# Patient Record
Sex: Male | Born: 1935
Health system: Southern US, Community
[De-identification: ages and names within clinical notes are randomized; demographics above are authoritative.]

## PROBLEM LIST (undated history)

## (undated) DIAGNOSIS — M8580 Other specified disorders of bone density and structure, unspecified site: Secondary | ICD-10-CM

## (undated) DIAGNOSIS — H3530 Unspecified macular degeneration: Secondary | ICD-10-CM

## (undated) DIAGNOSIS — E291 Testicular hypofunction: Secondary | ICD-10-CM

## (undated) DIAGNOSIS — G4733 Obstructive sleep apnea (adult) (pediatric): Secondary | ICD-10-CM

## (undated) DIAGNOSIS — M5136 Other intervertebral disc degeneration, lumbar region: Secondary | ICD-10-CM

## (undated) DIAGNOSIS — F32A Depression, unspecified: Secondary | ICD-10-CM

## (undated) DIAGNOSIS — K5790 Diverticulosis of intestine, part unspecified, without perforation or abscess without bleeding: Secondary | ICD-10-CM

## (undated) DIAGNOSIS — C61 Malignant neoplasm of prostate: Secondary | ICD-10-CM

## (undated) DIAGNOSIS — H409 Unspecified glaucoma: Secondary | ICD-10-CM

## (undated) DIAGNOSIS — F329 Major depressive disorder, single episode, unspecified: Secondary | ICD-10-CM

## (undated) DIAGNOSIS — D126 Benign neoplasm of colon, unspecified: Secondary | ICD-10-CM

## (undated) DIAGNOSIS — K219 Gastro-esophageal reflux disease without esophagitis: Secondary | ICD-10-CM

## (undated) DIAGNOSIS — Z9289 Personal history of other medical treatment: Secondary | ICD-10-CM

## (undated) DIAGNOSIS — E785 Hyperlipidemia, unspecified: Secondary | ICD-10-CM

## (undated) DIAGNOSIS — I4891 Unspecified atrial fibrillation: Secondary | ICD-10-CM

## (undated) DIAGNOSIS — Z951 Presence of aortocoronary bypass graft: Secondary | ICD-10-CM

## (undated) DIAGNOSIS — I350 Nonrheumatic aortic (valve) stenosis: Secondary | ICD-10-CM

## (undated) DIAGNOSIS — R972 Elevated prostate specific antigen [PSA]: Secondary | ICD-10-CM

## (undated) DIAGNOSIS — M1990 Unspecified osteoarthritis, unspecified site: Secondary | ICD-10-CM

## (undated) DIAGNOSIS — D649 Anemia, unspecified: Secondary | ICD-10-CM

## (undated) DIAGNOSIS — Z87442 Personal history of urinary calculi: Secondary | ICD-10-CM

## (undated) DIAGNOSIS — I2510 Atherosclerotic heart disease of native coronary artery without angina pectoris: Secondary | ICD-10-CM

## (undated) DIAGNOSIS — M51369 Other intervertebral disc degeneration, lumbar region without mention of lumbar back pain or lower extremity pain: Secondary | ICD-10-CM

## (undated) DIAGNOSIS — Z952 Presence of prosthetic heart valve: Secondary | ICD-10-CM

## (undated) DIAGNOSIS — F419 Anxiety disorder, unspecified: Secondary | ICD-10-CM

## (undated) HISTORY — DX: Obstructive sleep apnea (adult) (pediatric): G47.33

## (undated) HISTORY — DX: Presence of prosthetic heart valve: Z95.2

## (undated) HISTORY — DX: Unspecified macular degeneration: H35.30

## (undated) HISTORY — DX: Anemia, unspecified: D64.9

## (undated) HISTORY — DX: Unspecified osteoarthritis, unspecified site: M19.90

## (undated) HISTORY — DX: Unspecified glaucoma: H40.9

## (undated) HISTORY — DX: Other intervertebral disc degeneration, lumbar region without mention of lumbar back pain or lower extremity pain: M51.369

## (undated) HISTORY — DX: Depression, unspecified: F32.A

## (undated) HISTORY — DX: Other intervertebral disc degeneration, lumbar region: M51.36

## (undated) HISTORY — PX: TONSILLECTOMY: SUR1361

## (undated) HISTORY — DX: Other specified disorders of bone density and structure, unspecified site: M85.80

## (undated) HISTORY — DX: Atherosclerotic heart disease of native coronary artery without angina pectoris: I25.10

## (undated) HISTORY — DX: Major depressive disorder, single episode, unspecified: F32.9

## (undated) HISTORY — PX: CORNEAL TRANSPLANT: SHX108

## (undated) HISTORY — DX: Testicular hypofunction: E29.1

## (undated) HISTORY — DX: Hyperlipidemia, unspecified: E78.5

## (undated) HISTORY — DX: Elevated prostate specific antigen (PSA): R97.20

## (undated) HISTORY — DX: Malignant neoplasm of prostate: C61

## (undated) HISTORY — PX: CATARACT EXTRACTION: SUR2

## (undated) HISTORY — DX: Nonrheumatic aortic (valve) stenosis: I35.0

## (undated) HISTORY — DX: Gastro-esophageal reflux disease without esophagitis: K21.9

## (undated) HISTORY — DX: Diverticulosis of intestine, part unspecified, without perforation or abscess without bleeding: K57.90

## (undated) HISTORY — DX: Presence of aortocoronary bypass graft: Z95.1

## (undated) HISTORY — DX: Unspecified atrial fibrillation: I48.91

---

## 1976-08-28 HISTORY — PX: BACK SURGERY: SHX140

## 1998-09-28 ENCOUNTER — Ambulatory Visit (HOSPITAL_COMMUNITY): Admission: RE | Admit: 1998-09-28 | Discharge: 1998-09-28 | Payer: Self-pay | Admitting: Cardiology

## 2000-06-12 ENCOUNTER — Encounter (INDEPENDENT_AMBULATORY_CARE_PROVIDER_SITE_OTHER): Payer: Self-pay | Admitting: Specialist

## 2000-06-12 ENCOUNTER — Other Ambulatory Visit: Admission: RE | Admit: 2000-06-12 | Discharge: 2000-06-12 | Payer: Self-pay | Admitting: *Deleted

## 2004-11-04 ENCOUNTER — Ambulatory Visit: Payer: Self-pay | Admitting: Gastroenterology

## 2004-11-15 ENCOUNTER — Ambulatory Visit: Payer: Self-pay | Admitting: Gastroenterology

## 2007-11-08 ENCOUNTER — Ambulatory Visit: Payer: Self-pay | Admitting: Vascular Surgery

## 2008-11-05 DIAGNOSIS — K5730 Diverticulosis of large intestine without perforation or abscess without bleeding: Secondary | ICD-10-CM | POA: Insufficient documentation

## 2008-11-05 DIAGNOSIS — Z860100 Personal history of colon polyps, unspecified: Secondary | ICD-10-CM | POA: Insufficient documentation

## 2008-11-05 DIAGNOSIS — K649 Unspecified hemorrhoids: Secondary | ICD-10-CM | POA: Insufficient documentation

## 2008-11-06 ENCOUNTER — Ambulatory Visit: Payer: Self-pay | Admitting: Gastroenterology

## 2008-11-06 DIAGNOSIS — F411 Generalized anxiety disorder: Secondary | ICD-10-CM

## 2008-11-06 DIAGNOSIS — M818 Other osteoporosis without current pathological fracture: Secondary | ICD-10-CM

## 2008-11-06 DIAGNOSIS — I359 Nonrheumatic aortic valve disorder, unspecified: Secondary | ICD-10-CM

## 2008-11-06 DIAGNOSIS — K5900 Constipation, unspecified: Secondary | ICD-10-CM | POA: Insufficient documentation

## 2008-11-06 DIAGNOSIS — G4733 Obstructive sleep apnea (adult) (pediatric): Secondary | ICD-10-CM | POA: Insufficient documentation

## 2008-11-23 ENCOUNTER — Ambulatory Visit: Payer: Self-pay | Admitting: Gastroenterology

## 2008-11-23 ENCOUNTER — Encounter: Payer: Self-pay | Admitting: Gastroenterology

## 2008-11-25 ENCOUNTER — Encounter: Payer: Self-pay | Admitting: Gastroenterology

## 2009-03-09 ENCOUNTER — Ambulatory Visit: Payer: Self-pay | Admitting: *Deleted

## 2010-04-18 ENCOUNTER — Ambulatory Visit: Payer: Self-pay | Admitting: Cardiology

## 2010-05-12 ENCOUNTER — Ambulatory Visit: Payer: Self-pay | Admitting: Cardiology

## 2010-05-16 ENCOUNTER — Ambulatory Visit: Payer: Self-pay | Admitting: Cardiology

## 2010-05-17 ENCOUNTER — Inpatient Hospital Stay (HOSPITAL_BASED_OUTPATIENT_CLINIC_OR_DEPARTMENT_OTHER): Admission: RE | Admit: 2010-05-17 | Discharge: 2010-05-17 | Payer: Self-pay | Admitting: Cardiology

## 2010-05-24 ENCOUNTER — Ambulatory Visit: Payer: Self-pay | Admitting: Cardiothoracic Surgery

## 2010-05-28 HISTORY — PX: AORTIC VALVE REPLACEMENT: SHX41

## 2010-05-28 HISTORY — PX: CORONARY ARTERY BYPASS GRAFT: SHX141

## 2010-05-31 ENCOUNTER — Inpatient Hospital Stay (HOSPITAL_COMMUNITY): Admission: RE | Admit: 2010-05-31 | Discharge: 2010-06-08 | Payer: Self-pay | Admitting: Cardiothoracic Surgery

## 2010-05-31 ENCOUNTER — Ambulatory Visit: Payer: Self-pay | Admitting: Cardiothoracic Surgery

## 2010-05-31 ENCOUNTER — Encounter: Payer: Self-pay | Admitting: Cardiothoracic Surgery

## 2010-05-31 ENCOUNTER — Ambulatory Visit: Payer: Self-pay | Admitting: Cardiology

## 2010-06-09 ENCOUNTER — Ambulatory Visit: Payer: Self-pay | Admitting: Cardiology

## 2010-06-13 ENCOUNTER — Ambulatory Visit: Payer: Self-pay | Admitting: Cardiology

## 2010-06-21 ENCOUNTER — Ambulatory Visit: Payer: Self-pay | Admitting: Cardiology

## 2010-06-28 ENCOUNTER — Ambulatory Visit: Payer: Self-pay | Admitting: Cardiology

## 2010-06-30 ENCOUNTER — Encounter: Admission: RE | Admit: 2010-06-30 | Discharge: 2010-06-30 | Payer: Self-pay | Admitting: Cardiothoracic Surgery

## 2010-06-30 ENCOUNTER — Ambulatory Visit: Payer: Self-pay | Admitting: Cardiothoracic Surgery

## 2010-06-30 ENCOUNTER — Encounter (HOSPITAL_COMMUNITY)
Admission: RE | Admit: 2010-06-30 | Discharge: 2010-09-27 | Payer: Self-pay | Source: Home / Self Care | Attending: Cardiology | Admitting: Cardiology

## 2010-07-05 ENCOUNTER — Ambulatory Visit: Payer: Self-pay | Admitting: Cardiology

## 2010-07-05 ENCOUNTER — Ambulatory Visit (HOSPITAL_COMMUNITY): Admission: RE | Admit: 2010-07-05 | Discharge: 2010-07-05 | Payer: Self-pay | Admitting: Cardiology

## 2010-07-05 ENCOUNTER — Encounter: Payer: Self-pay | Admitting: Cardiology

## 2010-07-05 ENCOUNTER — Ambulatory Visit: Payer: Self-pay | Admitting: Cardiovascular Disease

## 2010-07-05 ENCOUNTER — Ambulatory Visit: Payer: Self-pay

## 2010-07-12 ENCOUNTER — Ambulatory Visit: Payer: Self-pay | Admitting: Cardiology

## 2010-07-13 ENCOUNTER — Ambulatory Visit: Payer: Self-pay | Admitting: Cardiovascular Disease

## 2010-07-13 ENCOUNTER — Inpatient Hospital Stay (HOSPITAL_COMMUNITY): Admission: EM | Admit: 2010-07-13 | Discharge: 2010-07-19 | Payer: Self-pay | Admitting: Emergency Medicine

## 2010-07-17 ENCOUNTER — Encounter (INDEPENDENT_AMBULATORY_CARE_PROVIDER_SITE_OTHER): Payer: Self-pay | Admitting: Internal Medicine

## 2010-08-09 ENCOUNTER — Ambulatory Visit: Payer: Self-pay | Admitting: Cardiology

## 2010-08-16 ENCOUNTER — Ambulatory Visit: Payer: Self-pay | Admitting: Cardiology

## 2010-09-08 LAB — BASIC METABOLIC PANEL: Glucose: 122 mg/dL

## 2010-09-27 ENCOUNTER — Encounter: Payer: Self-pay | Admitting: Cardiology

## 2010-09-27 DIAGNOSIS — F32A Depression, unspecified: Secondary | ICD-10-CM | POA: Insufficient documentation

## 2010-09-27 DIAGNOSIS — I350 Nonrheumatic aortic (valve) stenosis: Secondary | ICD-10-CM | POA: Insufficient documentation

## 2010-09-27 DIAGNOSIS — I4891 Unspecified atrial fibrillation: Secondary | ICD-10-CM | POA: Insufficient documentation

## 2010-09-27 DIAGNOSIS — E785 Hyperlipidemia, unspecified: Secondary | ICD-10-CM | POA: Insufficient documentation

## 2010-09-27 DIAGNOSIS — F329 Major depressive disorder, single episode, unspecified: Secondary | ICD-10-CM | POA: Insufficient documentation

## 2010-09-27 DIAGNOSIS — I2510 Atherosclerotic heart disease of native coronary artery without angina pectoris: Secondary | ICD-10-CM | POA: Insufficient documentation

## 2010-09-28 ENCOUNTER — Encounter (HOSPITAL_COMMUNITY): Payer: Medicare Other | Attending: Cardiology

## 2010-09-28 DIAGNOSIS — I2510 Atherosclerotic heart disease of native coronary artery without angina pectoris: Secondary | ICD-10-CM | POA: Insufficient documentation

## 2010-09-28 DIAGNOSIS — Z5189 Encounter for other specified aftercare: Secondary | ICD-10-CM | POA: Insufficient documentation

## 2010-09-28 DIAGNOSIS — I359 Nonrheumatic aortic valve disorder, unspecified: Secondary | ICD-10-CM | POA: Insufficient documentation

## 2010-09-28 DIAGNOSIS — Z954 Presence of other heart-valve replacement: Secondary | ICD-10-CM | POA: Insufficient documentation

## 2010-09-28 DIAGNOSIS — K219 Gastro-esophageal reflux disease without esophagitis: Secondary | ICD-10-CM | POA: Insufficient documentation

## 2010-09-28 DIAGNOSIS — I4891 Unspecified atrial fibrillation: Secondary | ICD-10-CM | POA: Insufficient documentation

## 2010-09-28 DIAGNOSIS — Z7982 Long term (current) use of aspirin: Secondary | ICD-10-CM | POA: Insufficient documentation

## 2010-09-28 DIAGNOSIS — G4733 Obstructive sleep apnea (adult) (pediatric): Secondary | ICD-10-CM | POA: Insufficient documentation

## 2010-09-28 DIAGNOSIS — E785 Hyperlipidemia, unspecified: Secondary | ICD-10-CM | POA: Insufficient documentation

## 2010-09-28 DIAGNOSIS — Z7901 Long term (current) use of anticoagulants: Secondary | ICD-10-CM | POA: Insufficient documentation

## 2010-09-28 DIAGNOSIS — Z87891 Personal history of nicotine dependence: Secondary | ICD-10-CM | POA: Insufficient documentation

## 2010-09-28 DIAGNOSIS — Z951 Presence of aortocoronary bypass graft: Secondary | ICD-10-CM | POA: Insufficient documentation

## 2010-09-30 ENCOUNTER — Encounter (HOSPITAL_COMMUNITY): Payer: Medicare Other

## 2010-10-03 ENCOUNTER — Encounter (HOSPITAL_COMMUNITY): Payer: Medicare Other

## 2010-10-05 ENCOUNTER — Encounter (HOSPITAL_COMMUNITY): Payer: Medicare Other

## 2010-10-07 ENCOUNTER — Encounter (HOSPITAL_COMMUNITY): Payer: Medicare Other

## 2010-10-10 ENCOUNTER — Encounter (HOSPITAL_COMMUNITY): Payer: Medicare Other

## 2010-10-12 ENCOUNTER — Encounter (HOSPITAL_COMMUNITY): Payer: Medicare Other

## 2010-10-14 ENCOUNTER — Encounter (HOSPITAL_COMMUNITY): Payer: Medicare Other

## 2010-10-17 ENCOUNTER — Encounter (HOSPITAL_COMMUNITY): Payer: Medicare Other

## 2010-10-19 ENCOUNTER — Encounter (HOSPITAL_COMMUNITY): Payer: Medicare Other

## 2010-10-21 ENCOUNTER — Encounter (HOSPITAL_COMMUNITY): Payer: Medicare Other

## 2010-10-24 ENCOUNTER — Encounter (HOSPITAL_COMMUNITY): Payer: Medicare Other

## 2010-10-26 ENCOUNTER — Encounter (HOSPITAL_COMMUNITY): Payer: Medicare Other

## 2010-10-28 ENCOUNTER — Encounter (HOSPITAL_COMMUNITY): Payer: Self-pay

## 2010-10-31 ENCOUNTER — Encounter (HOSPITAL_COMMUNITY): Payer: Self-pay

## 2010-11-02 ENCOUNTER — Encounter (HOSPITAL_COMMUNITY): Payer: Self-pay

## 2010-11-04 ENCOUNTER — Encounter (HOSPITAL_COMMUNITY): Payer: Self-pay

## 2010-11-07 ENCOUNTER — Encounter (HOSPITAL_COMMUNITY): Payer: Self-pay

## 2010-11-08 LAB — CULTURE, BLOOD (ROUTINE X 2)
Culture  Setup Time: 201111190200
Culture: NO GROWTH

## 2010-11-08 LAB — DIFFERENTIAL
Basophils Absolute: 0 10*3/uL (ref 0.0–0.1)
Basophils Absolute: 0.1 10*3/uL (ref 0.0–0.1)
Basophils Absolute: 0.1 10*3/uL (ref 0.0–0.1)
Basophils Relative: 0 % (ref 0–1)
Basophils Relative: 0 % (ref 0–1)
Basophils Relative: 1 % (ref 0–1)
Eosinophils Absolute: 0.1 10*3/uL (ref 0.0–0.7)
Eosinophils Absolute: 0.3 10*3/uL (ref 0.0–0.7)
Eosinophils Relative: 0 % (ref 0–5)
Eosinophils Relative: 1 % (ref 0–5)
Lymphocytes Relative: 10 % — ABNORMAL LOW (ref 12–46)
Lymphocytes Relative: 19 % (ref 12–46)
Lymphocytes Relative: 4 % — ABNORMAL LOW (ref 12–46)
Lymphs Abs: 0.5 10*3/uL — ABNORMAL LOW (ref 0.7–4.0)
Lymphs Abs: 2.2 10*3/uL (ref 0.7–4.0)
Monocytes Absolute: 0.1 10*3/uL (ref 0.1–1.0)
Monocytes Absolute: 1.7 10*3/uL — ABNORMAL HIGH (ref 0.1–1.0)
Monocytes Relative: 1 % — ABNORMAL LOW (ref 3–12)
Monocytes Relative: 11 % (ref 3–12)
Neutro Abs: 11.1 10*3/uL — ABNORMAL HIGH (ref 1.7–7.7)
Neutro Abs: 8.1 10*3/uL — ABNORMAL HIGH (ref 1.7–7.7)
Neutro Abs: 9.3 10*3/uL — ABNORMAL HIGH (ref 1.7–7.7)
Neutrophils Relative %: 80 % — ABNORMAL HIGH (ref 43–77)
Neutrophils Relative %: 95 % — ABNORMAL HIGH (ref 43–77)

## 2010-11-08 LAB — URINE CULTURE
Colony Count: NO GROWTH
Culture  Setup Time: 201111161349

## 2010-11-08 LAB — COMPREHENSIVE METABOLIC PANEL
ALT: 29 U/L (ref 0–53)
ALT: 32 U/L (ref 0–53)
ALT: 32 U/L (ref 0–53)
AST: 26 U/L (ref 0–37)
AST: 75 U/L — ABNORMAL HIGH (ref 0–37)
Albumin: 2.5 g/dL — ABNORMAL LOW (ref 3.5–5.2)
Albumin: 2.7 g/dL — ABNORMAL LOW (ref 3.5–5.2)
Alkaline Phosphatase: 48 U/L (ref 39–117)
Alkaline Phosphatase: 49 U/L (ref 39–117)
Alkaline Phosphatase: 51 U/L (ref 39–117)
Alkaline Phosphatase: 51 U/L (ref 39–117)
BUN: 11 mg/dL (ref 6–23)
BUN: 7 mg/dL (ref 6–23)
CO2: 27 mEq/L (ref 19–32)
CO2: 27 mEq/L (ref 19–32)
Chloride: 101 mEq/L (ref 96–112)
Chloride: 104 mEq/L (ref 96–112)
Chloride: 104 mEq/L (ref 96–112)
Creatinine, Ser: 0.97 mg/dL (ref 0.4–1.5)
GFR calc Af Amer: >60 mL/min (ref >60)
GFR calc Af Amer: >60 mL/min (ref >60)
GFR calc non Af Amer: >60 mL/min (ref >60)
Glucose, Bld: 96 mg/dL (ref 70–99)
Glucose, Bld: 96 mg/dL (ref 70–99)
Potassium: 3.7 mEq/L (ref 3.5–5.1)
Potassium: 3.9 mEq/L (ref 3.5–5.1)
Potassium: 4.1 mEq/L (ref 3.5–5.1)
Potassium: 4.1 mEq/L (ref 3.5–5.1)
Sodium: 136 mEq/L (ref 135–145)
Sodium: 138 mEq/L (ref 135–145)
Sodium: 138 mEq/L (ref 135–145)
Total Bilirubin: 0.3 mg/dL (ref 0.3–1.2)
Total Bilirubin: 0.4 mg/dL (ref 0.3–1.2)
Total Bilirubin: 0.4 mg/dL (ref 0.3–1.2)
Total Protein: 6.3 g/dL (ref 6.0–8.3)
Total Protein: 6.5 g/dL (ref 6.0–8.3)
Total Protein: 7.3 g/dL (ref 6.0–8.3)

## 2010-11-08 LAB — URINE MICROSCOPIC-ADD ON

## 2010-11-08 LAB — URINALYSIS, ROUTINE W REFLEX MICROSCOPIC
Glucose, UA: NEGATIVE mg/dL
Hgb urine dipstick: NEGATIVE
Nitrite: POSITIVE — AB
Protein, ur: 100 mg/dL — AB
Urobilinogen, UA: 1 mg/dL (ref 0.0–1.0)

## 2010-11-08 LAB — PROTIME-INR
INR: 1.78 — ABNORMAL HIGH (ref 0.00–1.49)
INR: 2.7 — ABNORMAL HIGH (ref 0.00–1.49)
INR: 3.1 — ABNORMAL HIGH (ref 0.00–1.49)
INR: 3.39 — ABNORMAL HIGH (ref 0.00–1.49)
INR: 3.45 — ABNORMAL HIGH (ref 0.00–1.49)
Prothrombin Time: 20.9 seconds — ABNORMAL HIGH (ref 11.6–15.2)
Prothrombin Time: 35.4 seconds — ABNORMAL HIGH (ref 11.6–15.2)

## 2010-11-08 LAB — CBC
HCT: 25.6 % — ABNORMAL LOW (ref 39.0–52.0)
HCT: 26.4 % — ABNORMAL LOW (ref 39.0–52.0)
HCT: 28.1 % — ABNORMAL LOW (ref 39.0–52.0)
Hemoglobin: 8 g/dL — ABNORMAL LOW (ref 13.0–17.0)
Hemoglobin: 8.3 g/dL — ABNORMAL LOW (ref 13.0–17.0)
Hemoglobin: 9 g/dL — ABNORMAL LOW (ref 13.0–17.0)
MCH: 27.4 pg (ref 26.0–34.0)
MCHC: 31.4 g/dL (ref 30.0–36.0)
MCHC: 32 g/dL (ref 30.0–36.0)
MCV: 85.7 fL (ref 78.0–100.0)
MCV: 86 fL (ref 78.0–100.0)
MCV: 87.1 fL (ref 78.0–100.0)
Platelets: 203 10*3/uL (ref 150–400)
Platelets: 214 10*3/uL (ref 150–400)
Platelets: 379 10*3/uL (ref 150–400)
RBC: 2.9 MIL/uL — ABNORMAL LOW (ref 4.22–5.81)
RBC: 2.94 MIL/uL — ABNORMAL LOW (ref 4.22–5.81)
RBC: 3.19 MIL/uL — ABNORMAL LOW (ref 4.22–5.81)
RBC: 3.28 MIL/uL — ABNORMAL LOW (ref 4.22–5.81)
RDW: 15.2 % (ref 11.5–15.5)
RDW: 15.6 % — ABNORMAL HIGH (ref 11.5–15.5)
RDW: 15.6 % — ABNORMAL HIGH (ref 11.5–15.5)
WBC: 11.6 10*3/uL — ABNORMAL HIGH (ref 4.0–10.5)
WBC: 11.7 10*3/uL — ABNORMAL HIGH (ref 4.0–10.5)
WBC: 11.7 10*3/uL — ABNORMAL HIGH (ref 4.0–10.5)
WBC: 14.6 10*3/uL — ABNORMAL HIGH (ref 4.0–10.5)
WBC: 9 10*3/uL (ref 4.0–10.5)

## 2010-11-08 LAB — APTT: aPTT: 40 seconds — ABNORMAL HIGH (ref 24–37)

## 2010-11-08 LAB — BASIC METABOLIC PANEL
BUN: 14 mg/dL (ref 6–23)
Calcium: 8.5 mg/dL (ref 8.4–10.5)
Chloride: 103 mEq/L (ref 96–112)
Creatinine, Ser: 1.21 mg/dL (ref 0.4–1.5)
GFR calc Af Amer: >60 mL/min (ref >60)
GFR calc non Af Amer: 57 mL/min — ABNORMAL LOW (ref >60)
Glucose, Bld: 181 mg/dL — ABNORMAL HIGH (ref 70–99)
Potassium: 3.7 mEq/L (ref 3.5–5.1)
Sodium: 135 mEq/L (ref 135–145)

## 2010-11-08 LAB — POCT CARDIAC MARKERS
CKMB, poc: 1.5 ng/mL (ref 1.0–8.0)
Troponin i, poc: <0.05 ng/mL (ref 0.00–0.09)

## 2010-11-09 ENCOUNTER — Encounter (HOSPITAL_COMMUNITY): Payer: Self-pay

## 2010-11-10 LAB — COMPREHENSIVE METABOLIC PANEL
ALT: 27 U/L (ref 0–53)
AST: 27 U/L (ref 0–37)
Albumin: 4 g/dL (ref 3.5–5.2)
Alkaline Phosphatase: 38 U/L — ABNORMAL LOW (ref 39–117)
BUN: 11 mg/dL (ref 6–23)
CO2: 23 mEq/L (ref 19–32)
Calcium: 9.2 mg/dL (ref 8.4–10.5)
Chloride: 106 mEq/L (ref 96–112)
Creatinine, Ser: 0.89 mg/dL (ref 0.4–1.5)
GFR calc Af Amer: >60 mL/min (ref >60)
GFR calc non Af Amer: >60 mL/min (ref >60)
Glucose, Bld: 110 mg/dL — ABNORMAL HIGH (ref 70–99)
Potassium: 3.9 mEq/L (ref 3.5–5.1)
Sodium: 138 mEq/L (ref 135–145)
Total Bilirubin: 0.5 mg/dL (ref 0.3–1.2)
Total Protein: 7.1 g/dL (ref 6.0–8.3)

## 2010-11-10 LAB — CBC
HCT: 27.2 % — ABNORMAL LOW (ref 39.0–52.0)
HCT: 27.6 % — ABNORMAL LOW (ref 39.0–52.0)
HCT: 27.7 % — ABNORMAL LOW (ref 39.0–52.0)
HCT: 28.4 % — ABNORMAL LOW (ref 39.0–52.0)
HCT: 28.9 % — ABNORMAL LOW (ref 39.0–52.0)
HCT: 29.3 % — ABNORMAL LOW (ref 39.0–52.0)
HCT: 30.5 % — ABNORMAL LOW (ref 39.0–52.0)
HCT: 38.7 % — ABNORMAL LOW (ref 39.0–52.0)
Hemoglobin: 10 g/dL — ABNORMAL LOW (ref 13.0–17.0)
Hemoglobin: 10.4 g/dL — ABNORMAL LOW (ref 13.0–17.0)
Hemoglobin: 9 g/dL — ABNORMAL LOW (ref 13.0–17.0)
Hemoglobin: 9.1 g/dL — ABNORMAL LOW (ref 13.0–17.0)
Hemoglobin: 9.2 g/dL — ABNORMAL LOW (ref 13.0–17.0)
Hemoglobin: 9.4 g/dL — ABNORMAL LOW (ref 13.0–17.0)
Hemoglobin: 9.8 g/dL — ABNORMAL LOW (ref 13.0–17.0)
Hemoglobin: 13.3 g/dL (ref 13.0–17.0)
MCH: 29.9 pg (ref 26.0–34.0)
MCH: 30.5 pg (ref 26.0–34.0)
MCH: 30.7 pg (ref 26.0–34.0)
MCH: 30.7 pg (ref 26.0–34.0)
MCH: 30.8 pg (ref 26.0–34.0)
MCH: 30.9 pg (ref 26.0–34.0)
MCH: 30.9 pg (ref 26.0–34.0)
MCH: 30.9 pg (ref 26.0–34.0)
MCHC: 33 g/dL (ref 30.0–36.0)
MCHC: 33.1 g/dL (ref 30.0–36.0)
MCHC: 33.1 g/dL (ref 30.0–36.0)
MCHC: 33.2 g/dL (ref 30.0–36.0)
MCHC: 33.9 g/dL (ref 30.0–36.0)
MCHC: 34.1 g/dL (ref 30.0–36.0)
MCHC: 34.1 g/dL (ref 30.0–36.0)
MCHC: 34.4 g/dL (ref 30.0–36.0)
MCV: 90.2 fL (ref 78.0–100.0)
MCV: 90.4 fL (ref 78.0–100.0)
MCV: 90.8 fL (ref 78.0–100.0)
MCV: 91.2 fL (ref 78.0–100.0)
MCV: 92.2 fL (ref 78.0–100.0)
MCV: 92.3 fL (ref 78.0–100.0)
MCV: 92.8 fL (ref 78.0–100.0)
MCV: 90 fL (ref 78.0–100.0)
Platelets: 100 10*3/uL — ABNORMAL LOW (ref 150–400)
Platelets: 100 10*3/uL — ABNORMAL LOW (ref 150–400)
Platelets: 137 10*3/uL — ABNORMAL LOW (ref 150–400)
Platelets: 79 10*3/uL — ABNORMAL LOW (ref 150–400)
Platelets: 83 10*3/uL — ABNORMAL LOW (ref 150–400)
Platelets: 85 10*3/uL — ABNORMAL LOW (ref 150–400)
Platelets: 93 10*3/uL — ABNORMAL LOW (ref 150–400)
Platelets: 170 10*3/uL (ref 150–400)
RBC: 2.93 MIL/uL — ABNORMAL LOW (ref 4.22–5.81)
RBC: 3 MIL/uL — ABNORMAL LOW (ref 4.22–5.81)
RBC: 3.04 MIL/uL — ABNORMAL LOW (ref 4.22–5.81)
RBC: 3.08 MIL/uL — ABNORMAL LOW (ref 4.22–5.81)
RBC: 3.17 MIL/uL — ABNORMAL LOW (ref 4.22–5.81)
RBC: 3.24 MIL/uL — ABNORMAL LOW (ref 4.22–5.81)
RBC: 3.38 MIL/uL — ABNORMAL LOW (ref 4.22–5.81)
RBC: 4.3 MIL/uL (ref 4.22–5.81)
RDW: 13.5 % (ref 11.5–15.5)
RDW: 13.5 % (ref 11.5–15.5)
RDW: 13.6 % (ref 11.5–15.5)
RDW: 14.2 % (ref 11.5–15.5)
RDW: 14.3 % (ref 11.5–15.5)
RDW: 14.3 % (ref 11.5–15.5)
RDW: 14.3 % (ref 11.5–15.5)
RDW: 13.7 % (ref 11.5–15.5)
WBC: 11.3 10*3/uL — ABNORMAL HIGH (ref 4.0–10.5)
WBC: 11.4 10*3/uL — ABNORMAL HIGH (ref 4.0–10.5)
WBC: 12.1 10*3/uL — ABNORMAL HIGH (ref 4.0–10.5)
WBC: 12.5 10*3/uL — ABNORMAL HIGH (ref 4.0–10.5)
WBC: 13.8 10*3/uL — ABNORMAL HIGH (ref 4.0–10.5)
WBC: 15.9 10*3/uL — ABNORMAL HIGH (ref 4.0–10.5)
WBC: 8.4 10*3/uL (ref 4.0–10.5)
WBC: 4.9 10*3/uL (ref 4.0–10.5)

## 2010-11-10 LAB — BASIC METABOLIC PANEL
BUN: 12 mg/dL (ref 6–23)
BUN: 12 mg/dL (ref 6–23)
BUN: 12 mg/dL (ref 6–23)
BUN: 12 mg/dL (ref 6–23)
CO2: 25 mEq/L (ref 19–32)
CO2: 28 mEq/L (ref 19–32)
CO2: 29 mEq/L (ref 19–32)
CO2: 29 mEq/L (ref 19–32)
Calcium: 7.5 mg/dL — ABNORMAL LOW (ref 8.4–10.5)
Calcium: 7.9 mg/dL — ABNORMAL LOW (ref 8.4–10.5)
Calcium: 8 mg/dL — ABNORMAL LOW (ref 8.4–10.5)
Calcium: 8.1 mg/dL — ABNORMAL LOW (ref 8.4–10.5)
Chloride: 102 mEq/L (ref 96–112)
Chloride: 102 mEq/L (ref 96–112)
Chloride: 105 mEq/L (ref 96–112)
Chloride: 108 mEq/L (ref 96–112)
Creatinine, Ser: 0.79 mg/dL (ref 0.4–1.5)
Creatinine, Ser: 0.92 mg/dL (ref 0.4–1.5)
Creatinine, Ser: 0.92 mg/dL (ref 0.4–1.5)
Creatinine, Ser: 1.02 mg/dL (ref 0.4–1.5)
GFR calc Af Amer: >60 mL/min (ref >60)
GFR calc Af Amer: >60 mL/min (ref >60)
GFR calc Af Amer: >60 mL/min (ref >60)
GFR calc Af Amer: >60 mL/min (ref >60)
GFR calc non Af Amer: >60 mL/min (ref >60)
GFR calc non Af Amer: >60 mL/min (ref >60)
GFR calc non Af Amer: >60 mL/min (ref >60)
GFR calc non Af Amer: >60 mL/min (ref >60)
Glucose, Bld: 120 mg/dL — ABNORMAL HIGH (ref 70–99)
Glucose, Bld: 121 mg/dL — ABNORMAL HIGH (ref 70–99)
Glucose, Bld: 127 mg/dL — ABNORMAL HIGH (ref 70–99)
Glucose, Bld: 127 mg/dL — ABNORMAL HIGH (ref 70–99)
Potassium: 3.5 mEq/L (ref 3.5–5.1)
Potassium: 4 mEq/L (ref 3.5–5.1)
Potassium: 4.1 mEq/L (ref 3.5–5.1)
Potassium: 4.2 mEq/L (ref 3.5–5.1)
Sodium: 136 mEq/L (ref 135–145)
Sodium: 136 mEq/L (ref 135–145)
Sodium: 137 mEq/L (ref 135–145)
Sodium: 139 mEq/L (ref 135–145)

## 2010-11-10 LAB — POCT I-STAT 3, VENOUS BLOOD GAS (G3P V)
O2 Saturation: 68 %
pCO2, Ven: 48.2 mmHg (ref 45.0–50.0)
pH, Ven: 7.36 — ABNORMAL HIGH (ref 7.250–7.300)
pO2, Ven: 37 mmHg (ref 30.0–45.0)

## 2010-11-10 LAB — GLUCOSE, CAPILLARY
Glucose-Capillary: 103 mg/dL — ABNORMAL HIGH (ref 70–99)
Glucose-Capillary: 104 mg/dL — ABNORMAL HIGH (ref 70–99)
Glucose-Capillary: 113 mg/dL — ABNORMAL HIGH (ref 70–99)
Glucose-Capillary: 115 mg/dL — ABNORMAL HIGH (ref 70–99)
Glucose-Capillary: 116 mg/dL — ABNORMAL HIGH (ref 70–99)
Glucose-Capillary: 117 mg/dL — ABNORMAL HIGH (ref 70–99)
Glucose-Capillary: 118 mg/dL — ABNORMAL HIGH (ref 70–99)
Glucose-Capillary: 118 mg/dL — ABNORMAL HIGH (ref 70–99)
Glucose-Capillary: 120 mg/dL — ABNORMAL HIGH (ref 70–99)
Glucose-Capillary: 123 mg/dL — ABNORMAL HIGH (ref 70–99)
Glucose-Capillary: 123 mg/dL — ABNORMAL HIGH (ref 70–99)
Glucose-Capillary: 123 mg/dL — ABNORMAL HIGH (ref 70–99)
Glucose-Capillary: 124 mg/dL — ABNORMAL HIGH (ref 70–99)
Glucose-Capillary: 124 mg/dL — ABNORMAL HIGH (ref 70–99)
Glucose-Capillary: 126 mg/dL — ABNORMAL HIGH (ref 70–99)
Glucose-Capillary: 126 mg/dL — ABNORMAL HIGH (ref 70–99)
Glucose-Capillary: 127 mg/dL — ABNORMAL HIGH (ref 70–99)
Glucose-Capillary: 127 mg/dL — ABNORMAL HIGH (ref 70–99)
Glucose-Capillary: 127 mg/dL — ABNORMAL HIGH (ref 70–99)
Glucose-Capillary: 128 mg/dL — ABNORMAL HIGH (ref 70–99)
Glucose-Capillary: 130 mg/dL — ABNORMAL HIGH (ref 70–99)
Glucose-Capillary: 130 mg/dL — ABNORMAL HIGH (ref 70–99)
Glucose-Capillary: 133 mg/dL — ABNORMAL HIGH (ref 70–99)
Glucose-Capillary: 134 mg/dL — ABNORMAL HIGH (ref 70–99)
Glucose-Capillary: 138 mg/dL — ABNORMAL HIGH (ref 70–99)
Glucose-Capillary: 140 mg/dL — ABNORMAL HIGH (ref 70–99)
Glucose-Capillary: 142 mg/dL — ABNORMAL HIGH (ref 70–99)
Glucose-Capillary: 142 mg/dL — ABNORMAL HIGH (ref 70–99)
Glucose-Capillary: 143 mg/dL — ABNORMAL HIGH (ref 70–99)
Glucose-Capillary: 143 mg/dL — ABNORMAL HIGH (ref 70–99)
Glucose-Capillary: 155 mg/dL — ABNORMAL HIGH (ref 70–99)
Glucose-Capillary: 169 mg/dL — ABNORMAL HIGH (ref 70–99)
Glucose-Capillary: 60 mg/dL — ABNORMAL LOW (ref 70–99)
Glucose-Capillary: 97 mg/dL (ref 70–99)

## 2010-11-10 LAB — POCT I-STAT 3, ART BLOOD GAS (G3+)
Acid-Base Excess: 1 mmol/L (ref 0.0–2.0)
Acid-base deficit: 1 mmol/L (ref 0.0–2.0)
Acid-base deficit: 2 mmol/L (ref 0.0–2.0)
Acid-base deficit: 3 mmol/L — ABNORMAL HIGH (ref 0.0–2.0)
Acid-base deficit: 4 mmol/L — ABNORMAL HIGH (ref 0.0–2.0)
Bicarbonate: 21.3 mEq/L (ref 20.0–24.0)
Bicarbonate: 23.2 mEq/L (ref 20.0–24.0)
Bicarbonate: 24 mEq/L (ref 20.0–24.0)
Bicarbonate: 24.1 mEq/L — ABNORMAL HIGH (ref 20.0–24.0)
Bicarbonate: 24.6 mEq/L — ABNORMAL HIGH (ref 20.0–24.0)
O2 Saturation: 100 %
O2 Saturation: 100 %
O2 Saturation: 100 %
O2 Saturation: 96 %
O2 Saturation: 98 %
Patient temperature: 35.6
Patient temperature: 36.2
TCO2: 23 mmol/L (ref 0–100)
TCO2: 24 mmol/L (ref 0–100)
TCO2: 25 mmol/L (ref 0–100)
TCO2: 25 mmol/L (ref 0–100)
TCO2: 26 mmol/L (ref 0–100)
pCO2 arterial: 37.2 mmHg (ref 35.0–45.0)
pCO2 arterial: 38.2 mmHg (ref 35.0–45.0)
pCO2 arterial: 40.3 mmHg (ref 35.0–45.0)
pCO2 arterial: 42.5 mmHg (ref 35.0–45.0)
pCO2 arterial: 47 mmHg — ABNORMAL HIGH (ref 35.0–45.0)
pCO2 arterial: 44.5 mmHg (ref 35.0–45.0)
pH, Arterial: 7.313 — ABNORMAL LOW (ref 7.350–7.450)
pH, Arterial: 7.359 (ref 7.350–7.450)
pH, Arterial: 7.368 (ref 7.350–7.450)
pH, Arterial: 7.369 (ref 7.350–7.450)
pH, Arterial: 7.408 (ref 7.350–7.450)
pO2, Arterial: 108 mmHg — ABNORMAL HIGH (ref 80.0–100.0)
pO2, Arterial: 289 mmHg — ABNORMAL HIGH (ref 80.0–100.0)
pO2, Arterial: 326 mmHg — ABNORMAL HIGH (ref 80.0–100.0)
pO2, Arterial: 421 mmHg — ABNORMAL HIGH (ref 80.0–100.0)
pO2, Arterial: 78 mmHg — ABNORMAL LOW (ref 80.0–100.0)
pO2, Arterial: 67 mmHg — ABNORMAL LOW (ref 80.0–100.0)

## 2010-11-10 LAB — HEMOGLOBIN AND HEMATOCRIT, BLOOD
HCT: 29.2 % — ABNORMAL LOW (ref 39.0–52.0)
Hemoglobin: 9.8 g/dL — ABNORMAL LOW (ref 13.0–17.0)

## 2010-11-10 LAB — POCT I-STAT 4, (NA,K, GLUC, HGB,HCT)
Glucose, Bld: 101 mg/dL — ABNORMAL HIGH (ref 70–99)
Glucose, Bld: 106 mg/dL — ABNORMAL HIGH (ref 70–99)
Glucose, Bld: 114 mg/dL — ABNORMAL HIGH (ref 70–99)
Glucose, Bld: 119 mg/dL — ABNORMAL HIGH (ref 70–99)
Glucose, Bld: 120 mg/dL — ABNORMAL HIGH (ref 70–99)
Glucose, Bld: 143 mg/dL — ABNORMAL HIGH (ref 70–99)
Glucose, Bld: 95 mg/dL (ref 70–99)
HCT: 27 % — ABNORMAL LOW (ref 39.0–52.0)
HCT: 28 % — ABNORMAL LOW (ref 39.0–52.0)
HCT: 31 % — ABNORMAL LOW (ref 39.0–52.0)
HCT: 31 % — ABNORMAL LOW (ref 39.0–52.0)
HCT: 32 % — ABNORMAL LOW (ref 39.0–52.0)
HCT: 34 % — ABNORMAL LOW (ref 39.0–52.0)
HCT: 37 % — ABNORMAL LOW (ref 39.0–52.0)
Hemoglobin: 10.5 g/dL — ABNORMAL LOW (ref 13.0–17.0)
Hemoglobin: 10.5 g/dL — ABNORMAL LOW (ref 13.0–17.0)
Hemoglobin: 10.9 g/dL — ABNORMAL LOW (ref 13.0–17.0)
Hemoglobin: 11.6 g/dL — ABNORMAL LOW (ref 13.0–17.0)
Hemoglobin: 12.6 g/dL — ABNORMAL LOW (ref 13.0–17.0)
Hemoglobin: 9.2 g/dL — ABNORMAL LOW (ref 13.0–17.0)
Hemoglobin: 9.5 g/dL — ABNORMAL LOW (ref 13.0–17.0)
Potassium: 3.4 mEq/L — ABNORMAL LOW (ref 3.5–5.1)
Potassium: 3.8 mEq/L (ref 3.5–5.1)
Potassium: 3.9 mEq/L (ref 3.5–5.1)
Potassium: 3.9 mEq/L (ref 3.5–5.1)
Potassium: 4 mEq/L (ref 3.5–5.1)
Potassium: 4.7 mEq/L (ref 3.5–5.1)
Potassium: 4.9 mEq/L (ref 3.5–5.1)
Sodium: 135 mEq/L (ref 135–145)
Sodium: 137 mEq/L (ref 135–145)
Sodium: 138 mEq/L (ref 135–145)
Sodium: 138 mEq/L (ref 135–145)
Sodium: 140 mEq/L (ref 135–145)
Sodium: 140 mEq/L (ref 135–145)
Sodium: 140 mEq/L (ref 135–145)

## 2010-11-10 LAB — CREATININE, SERUM
Creatinine, Ser: 0.79 mg/dL (ref 0.4–1.5)
Creatinine, Ser: 0.89 mg/dL (ref 0.4–1.5)
GFR calc Af Amer: >60 mL/min (ref >60)
GFR calc Af Amer: >60 mL/min (ref >60)
GFR calc non Af Amer: >60 mL/min (ref >60)
GFR calc non Af Amer: >60 mL/min (ref >60)

## 2010-11-10 LAB — TYPE AND SCREEN
ABO/RH(D): A NEG
Antibody Screen: NEGATIVE

## 2010-11-10 LAB — PROTIME-INR
INR: 1.13 (ref 0.00–1.49)
INR: 1.19 (ref 0.00–1.49)
INR: 1.32 (ref 0.00–1.49)
INR: 1.4 (ref 0.00–1.49)
INR: 1.46 (ref 0.00–1.49)
INR: 1.49 (ref 0.00–1.49)
INR: 0.98 (ref 0.00–1.49)
Prothrombin Time: 14.7 seconds (ref 11.6–15.2)
Prothrombin Time: 15.3 seconds — ABNORMAL HIGH (ref 11.6–15.2)
Prothrombin Time: 16.6 seconds — ABNORMAL HIGH (ref 11.6–15.2)
Prothrombin Time: 17.4 seconds — ABNORMAL HIGH (ref 11.6–15.2)
Prothrombin Time: 17.9 seconds — ABNORMAL HIGH (ref 11.6–15.2)
Prothrombin Time: 18.2 seconds — ABNORMAL HIGH (ref 11.6–15.2)
Prothrombin Time: 13.2 seconds (ref 11.6–15.2)

## 2010-11-10 LAB — POCT I-STAT, CHEM 8
BUN: 11 mg/dL (ref 6–23)
BUN: 14 mg/dL (ref 6–23)
Calcium, Ion: 1.08 mmol/L — ABNORMAL LOW (ref 1.12–1.32)
Calcium, Ion: 1.11 mmol/L — ABNORMAL LOW (ref 1.12–1.32)
Chloride: 100 mEq/L (ref 96–112)
Chloride: 106 mEq/L (ref 96–112)
Creatinine, Ser: 0.6 mg/dL (ref 0.4–1.5)
Creatinine, Ser: 0.9 mg/dL (ref 0.4–1.5)
Glucose, Bld: 112 mg/dL — ABNORMAL HIGH (ref 70–99)
Glucose, Bld: 115 mg/dL — ABNORMAL HIGH (ref 70–99)
HCT: 29 % — ABNORMAL LOW (ref 39.0–52.0)
HCT: 30 % — ABNORMAL LOW (ref 39.0–52.0)
Hemoglobin: 10.2 g/dL — ABNORMAL LOW (ref 13.0–17.0)
Hemoglobin: 9.9 g/dL — ABNORMAL LOW (ref 13.0–17.0)
Potassium: 4 mEq/L (ref 3.5–5.1)
Potassium: 4 mEq/L (ref 3.5–5.1)
Sodium: 139 mEq/L (ref 135–145)
Sodium: 142 mEq/L (ref 135–145)
TCO2: 24 mmol/L (ref 0–100)
TCO2: 26 mmol/L (ref 0–100)

## 2010-11-10 LAB — URINALYSIS, ROUTINE W REFLEX MICROSCOPIC
Bilirubin Urine: NEGATIVE
Glucose, UA: NEGATIVE mg/dL
Hgb urine dipstick: NEGATIVE
Ketones, ur: NEGATIVE mg/dL
Nitrite: NEGATIVE
Protein, ur: NEGATIVE mg/dL
Specific Gravity, Urine: 1.015 (ref 1.005–1.030)
Urobilinogen, UA: 0.2 mg/dL (ref 0.0–1.0)
pH: 6.5 (ref 5.0–8.0)

## 2010-11-10 LAB — BLOOD GAS, ARTERIAL
Acid-Base Excess: 0.9 mmol/L (ref 0.0–2.0)
Bicarbonate: 25.3 mEq/L — ABNORMAL HIGH (ref 20.0–24.0)
Drawn by: 206361
FIO2: 0.21 %
O2 Saturation: 96 %
Patient temperature: 98.6
TCO2: 26.6 mmol/L (ref 0–100)
pCO2 arterial: 42.5 mmHg (ref 35.0–45.0)
pH, Arterial: 7.392 (ref 7.350–7.450)
pO2, Arterial: 79.2 mmHg — ABNORMAL LOW (ref 80.0–100.0)

## 2010-11-10 LAB — APTT
aPTT: 43 seconds — ABNORMAL HIGH (ref 24–37)
aPTT: 34 seconds (ref 24–37)

## 2010-11-10 LAB — ABO/RH: ABO/RH(D): A NEG

## 2010-11-10 LAB — MAGNESIUM
Magnesium: 2.7 mg/dL — ABNORMAL HIGH (ref 1.5–2.5)
Magnesium: 2.8 mg/dL — ABNORMAL HIGH (ref 1.5–2.5)
Magnesium: 3.1 mg/dL — ABNORMAL HIGH (ref 1.5–2.5)

## 2010-11-10 LAB — POCT I-STAT GLUCOSE
Glucose, Bld: 101 mg/dL — ABNORMAL HIGH (ref 70–99)
Operator id: 3408

## 2010-11-10 LAB — HEMOGLOBIN A1C
Hgb A1c MFr Bld: 5.8 % — ABNORMAL HIGH (ref <5.7)
Mean Plasma Glucose: 120 mg/dL — ABNORMAL HIGH (ref <117)

## 2010-11-10 LAB — SURGICAL PCR SCREEN
MRSA, PCR: NEGATIVE
Staphylococcus aureus: POSITIVE — AB

## 2010-11-10 LAB — PLATELET COUNT: Platelets: 131 10*3/uL — ABNORMAL LOW (ref 150–400)

## 2010-11-11 ENCOUNTER — Encounter (HOSPITAL_COMMUNITY): Payer: Self-pay

## 2010-11-14 ENCOUNTER — Encounter (HOSPITAL_COMMUNITY): Payer: Self-pay

## 2010-11-16 ENCOUNTER — Ambulatory Visit (INDEPENDENT_AMBULATORY_CARE_PROVIDER_SITE_OTHER): Payer: Medicare Other | Admitting: Cardiology

## 2010-11-16 ENCOUNTER — Encounter: Payer: Self-pay | Admitting: Cardiology

## 2010-11-16 ENCOUNTER — Encounter (HOSPITAL_COMMUNITY): Payer: Self-pay

## 2010-11-16 DIAGNOSIS — I2510 Atherosclerotic heart disease of native coronary artery without angina pectoris: Secondary | ICD-10-CM

## 2010-11-16 DIAGNOSIS — I359 Nonrheumatic aortic valve disorder, unspecified: Secondary | ICD-10-CM

## 2010-11-16 DIAGNOSIS — E785 Hyperlipidemia, unspecified: Secondary | ICD-10-CM

## 2010-11-16 DIAGNOSIS — I4891 Unspecified atrial fibrillation: Secondary | ICD-10-CM

## 2010-11-16 NOTE — Assessment & Plan Note (Signed)
S/p CABGx1 9/11. No angina. Continue beta blocker, risk factor modification.

## 2010-11-16 NOTE — Assessment & Plan Note (Signed)
Post Op Afib resolved. Off Coumadin and Amiodarone since Dec. On ASA.

## 2010-11-16 NOTE — Assessment & Plan Note (Addendum)
Continue Crestor, Niacin, Lovaza. F/u lab with Dr. Waynard Edwards in June.

## 2010-11-16 NOTE — Assessment & Plan Note (Signed)
S/p AVR with #23 mm Magna Ease pericardial valve 05/31/10. F/u Echo 11/11 showed good valve function. Needs routine SBE prophylaxis.

## 2010-11-16 NOTE — Progress Notes (Signed)
HPI Jason [name] is seen for followup today after aortic valve replacement and bypass surgery in October of 2011. He continues to make excellent progress. He completed the cardiac rehabilitation program. [facility] He is also doing very vigorous yard work. He denies any chest pain, shortness of breath, dizziness, or palpitations. No Known Allergies  Current Outpatient Prescriptions on File Prior to Visit  Medication Sig Dispense Refill  . Acetaminophen (TYLENOL PO) Take by mouth as needed.        [name] alendronate (FOSAMAX) 70 MG tablet Take 70 mg by mouth every 7 (seven) days. Take in the morning with a full glass of water, on an empty stomach, and do not take anything else by mouth or lie down for the next 30 min.       [name] aspirin 81 MG tablet Take 81 mg by mouth daily.        . brinzolamide (AZOPT) 1 % ophthalmic suspension 1 drop daily.        . calcium carbonate 200 MG capsule Take by mouth 2 (two) times daily with meals.        . docusate sodium (COLACE) 100 MG capsule Take 100 mg by mouth daily.        . fluticasone (FLONASE) 50 MCG/ACT nasal spray 1 spray by Nasal route daily.        [name] GLUCOSAMINE PO Take by mouth 2 (two) times daily.        . metoprolol (LOPRESSOR) 25 MG tablet Take 25 mg by mouth 2 (two) times daily.        . Multiple Vitamin (MULTIVITAMIN) tablet Take 1 tablet by mouth daily.        . Multiple Vitamins-Minerals (ICAPS PO) Take by mouth 2 (two) times daily.        [name] NIACIN, ANTIHYPERLIPIDEMIC, PO Take by mouth at bedtime. 2 TABLETS HS       . Omega-3-acid Ethyl Esters (LOVAZA PO) Take by mouth 3 (three) times daily.        [name] perphenazine-amitriptyline 4-25 MG TABS Take 1 tablet by mouth at bedtime.        [name] RANITIDINE HCL PO Take by mouth daily.        . rosuvastatin (CRESTOR) 5 MG tablet Take 5 mg by mouth at bedtime.        . travoprost, benzalkonium, (TRAVATAN) 0.004 % ophthalmic solution Place 1 drop into both eyes at bedtime.         [name] VITAMIN D PO Take by mouth daily.        . traMADol (ULTRAM) 50 MG tablet Take 50 mg by mouth as needed.          Past Medical History  Diagnosis Date  . CAD (coronary artery disease)   . Aortic stenosis   . Atrial fibrillation     POST OP  . Dyslipidemia   . Depression     Past Surgical History  Procedure Date  . Coronary artery bypass graft   . Aortic valve replacement   . Cataract extraction   . Corneal transplant   . Back surgery   . Tonsillectomy     Family History  Problem Relation Age of Onset  . Heart attack Father     History   Social History  . Marital Status: Married    Spouse Name: N/A    Number of Children: N/A  . Years of Education: N/A  Occupational History  . Not on file.   Social History Main Topics  . Smoking status: Former Smoker -- 1.0 packs/day    Quit date: 11/16/1990  . Smokeless tobacco: Not on file  . Alcohol Use: 1.2 oz/week    2 Glasses of wine per week  . Drug Use: No  . Sexually Active:    Other Topics Concern  . Not on file   Social History Narrative  . No narrative on file    ROS The patient denies any heat or cold intolerance.  No weight gain or weight loss.  The patient denies headaches or blurry vision.  There is no cough or sputum production.  The patient denies dizziness.  There is no hematuria or hematochezia.  The patient denies any muscle aches or arthritis.  The patient denies any rash.  The patient denies frequent falling or instability.  There is no history of depression or anxiety.  All other systems were reviewed and are negative.  PHYSICAL EXAM BP 142/72  Pulse 69  Ht 5\' 11"  (1.803 m)  Wt 191 lb (86.637 kg)  BMI 26.64 kg/m2 The patient is alert and oriented x 3.  The mood and affect are normal.  The skin is warm and dry.  Color is normal.  The HEENT exam reveals that the sclera are nonicteric.  The mucous membranes are moist.  The carotids are 2+ without bruits.  There is no thyromegaly.  There is no  JVD.  The lungs are clear.  The chest wall is non tender.  The heart exam reveals a regular rate with a grade 2/6 systolic ejection murmur in the aortic area. There is no diastolic murmur..    The PMI is not displaced.   Abdominal exam reveals good bowel sounds.  There is no guarding or rebound.  There is no hepatosplenomegaly or tenderness.  There are no masses.  Exam of the legs reveal no clubbing, cyanosis, or edema.  The legs are without rashes.  The distal pulses are intact.  Cranial nerves II - XII are intact.  Motor and sensory functions are intact.  The gait is normal.  Laboratory data: ECG demonstrates normal sinus rhythm with a left anterior fascicular block and an incomplete left bundle branch block.  ASSESSMENT AND PLAN

## 2010-11-18 ENCOUNTER — Encounter (HOSPITAL_COMMUNITY): Payer: Self-pay

## 2010-11-21 ENCOUNTER — Encounter (HOSPITAL_COMMUNITY): Payer: Self-pay

## 2010-11-23 ENCOUNTER — Encounter (HOSPITAL_COMMUNITY): Payer: Self-pay

## 2010-11-25 ENCOUNTER — Encounter (HOSPITAL_COMMUNITY): Payer: Self-pay

## 2010-11-28 ENCOUNTER — Encounter (HOSPITAL_COMMUNITY): Payer: Self-pay

## 2010-11-30 ENCOUNTER — Encounter (HOSPITAL_COMMUNITY): Payer: Self-pay

## 2010-12-02 ENCOUNTER — Encounter (HOSPITAL_COMMUNITY): Payer: Self-pay

## 2010-12-05 ENCOUNTER — Encounter (HOSPITAL_COMMUNITY): Payer: Self-pay

## 2010-12-07 ENCOUNTER — Encounter (HOSPITAL_COMMUNITY): Payer: Self-pay

## 2010-12-09 ENCOUNTER — Encounter (HOSPITAL_COMMUNITY): Payer: Self-pay

## 2010-12-12 ENCOUNTER — Encounter (HOSPITAL_COMMUNITY): Payer: Self-pay

## 2010-12-14 ENCOUNTER — Encounter (HOSPITAL_COMMUNITY): Payer: Self-pay

## 2010-12-16 ENCOUNTER — Encounter (HOSPITAL_COMMUNITY): Payer: Self-pay

## 2010-12-19 ENCOUNTER — Encounter (HOSPITAL_COMMUNITY): Payer: Self-pay

## 2010-12-23 ENCOUNTER — Encounter: Payer: Self-pay | Admitting: Cardiology

## 2011-01-10 NOTE — Procedures (Signed)
CAROTID DUPLEX EXAM   INDICATION:  Followup carotid disease.   HISTORY:  Diabetes:  No.  Cardiac:  Aortic stenosis.  Hypertension:  No.  Smoking:  No, quit in 1994.  Previous Surgery:  No.  CV History:  Amaurosis Fugax No, Paresthesias No, Hemiparesis No                                       RIGHT             LEFT  Brachial systolic pressure:         118               124  Brachial Doppler waveforms:         Biphasic          Biphasic  Vertebral direction of flow:        Antegrade         Antegrade  DUPLEX VELOCITIES (cm/sec)  CCA peak systolic                   114               92  ECA peak systolic                   116               120  ICA peak systolic                   99                123  ICA end diastolic                   23                33  PLAQUE MORPHOLOGY:                  Mixed             Mixed  PLAQUE AMOUNT:                      Mild              Mild  PLAQUE LOCATION:                    ICA, ECA          ICA, ECA   IMPRESSION:  1. Mild bilateral ECA stenoses.  2. 20-39% bilateral ICA stenoses.  3. A preliminary copy was faxed to Dr. Laurey Morale office.   ___________________________________________  Di Kindle. Edilia Bo, M.D.   DP/MEDQ  D:  11/08/2007  T:  11/08/2007  Job:  [id number]

## 2011-01-10 NOTE — Assessment & Plan Note (Signed)
OFFICE VISIT   ROGELIO, WAYNICK  DOB:  1925/09/15                                        June 30, 2010  CHART #:  [id number]   The patient returns to the office today in follow up after his aortic  valve replacement with a #23 Magna aortic bovine prosthesis and coronary  artery bypass grafting x1 with a left internal mammary to the left  anterior descending coronary artery.  Considering the patient's age of  27 years, he is making excellent progress postoperatively.  He starts in  the cardiac rehab program on Monday.  He has had no signs or symptoms of  congestive heart failure postoperatively.   On physical exam, his blood pressure is 118/60, pulse 62, respiratory  rate is 18, O2 sats 98%.  His sternum is stable and well healed.  His  valve sounds are crisp without murmur of aortic insufficiency.  His  heart tones are regular.  It does not appear that he is currently in  atrial fibrillation.  He has no pedal edema or calf tenderness.   He continues on Coumadin because of postoperative atrial fibrillation.  He noted earlier this week his INR was elevated to 3.4 and he has had  directions through Dr. Elvis Coil office to decrease his Coumadin dose and  a followup visit on Monday.  If he remains in sinus rhythm, the plan  would be to discontinue his Coumadin in the future and continue on  aspirin.   Followup chest x-ray was performed that shows clear lung fields  bilaterally.   Overall, I am very pleased with his progress.  I have not made him a  return appointment for him to see me as he is closely followed by Dr.  Swaziland but would be glad to see him at the patient's or Dr. Elvis Coil  request at anytime.   Also note, I have discussed with the patient the need for antibiotic  prophylaxis with his valve with dental work and other invasive  procedures and he is aware of this and had been doing this prior to  surgery.   Sheliah Plane, MD  Electronically Signed   EG/MEDQ  D:  06/30/2010  T:  07/01/2010  Job:  [id number]   cc:   Peter M. Swaziland, M.D.  Redge Gainer. Perini, M.D.

## 2011-01-10 NOTE — Procedures (Signed)
DUPLEX DEEP VENOUS EXAM - LOWER EXTREMITY   INDICATION:  Left lower extremity pain and swelling   HISTORY:  Edema:  Yes  Trauma/Surgery:  Yes  Pain:  Yes  PE:  No. Previous DVT:  No  Anticoagulants:  Other:   DUPLEX EXAM:                CFV   SFV   PopV  PTV    GSV                R  L  R  L  R  L  R   L  R  L  Thrombosis    0  0     0     0      0     0  Spontaneous   +  +     +     +      +     +  Phasic        +  +     +     +      +     +  Augmentation  +  +     +     +      +     +  Compressible  +  +     +     +      +     +  Competent     +  +     +     +      +     +   Legend:  + - yes  o - no  p - partial  D - decreased   IMPRESSION:  No evidence of deep venous thrombosis noted in the left  leg.  What appears to be a ruptured Baker's cyst noted in the left popliteal  fossa.   Notified Danielle with results.    _____________________________  P. Liliane Bade, M.D.   MG/MEDQ  D:  03/09/2009  T:  03/09/2009  Job:  [id number]

## 2011-01-20 ENCOUNTER — Telehealth: Payer: Self-pay | Admitting: Cardiology

## 2011-01-20 NOTE — Telephone Encounter (Signed)
Called stating HR has been in the 80's but irreg; has been very tired recently. Per Dr. Swaziland will let Lawson Fiscal see next week w/ EKG. May be back in AFIB. App made.

## 2011-01-20 NOTE — Telephone Encounter (Signed)
Want to know if she should make an appointment for her husband for his irregular heartbeat.  He had valve replacement and she thinks that this may be normal she's just not sure

## 2011-01-24 ENCOUNTER — Encounter: Payer: Self-pay | Admitting: Nurse Practitioner

## 2011-01-24 ENCOUNTER — Ambulatory Visit (INDEPENDENT_AMBULATORY_CARE_PROVIDER_SITE_OTHER): Payer: Medicare Other | Admitting: Nurse Practitioner

## 2011-01-24 VITALS — BP 118/58 | HR 71 | Ht 70.0 in | Wt 189.4 lb

## 2011-01-24 DIAGNOSIS — R002 Palpitations: Secondary | ICD-10-CM | POA: Insufficient documentation

## 2011-01-24 DIAGNOSIS — I4891 Unspecified atrial fibrillation: Secondary | ICD-10-CM

## 2011-01-24 NOTE — Progress Notes (Signed)
Deeann Saint Lad Date of Birth: 1925-10-31   History of Present Illness: Mr. Hudler is seen today for a work in visit. He is seen for Dr. Swaziland. He is here with his wife. He is feeling fine. He has no complaint. He is not having dizziness, chest pain, shortness of breath or any palpitations. His wife brought him in for evaluation because she had noticed his pulse was irregular and that it was also irregular on their blood pressure cuff. She notes that it has not been fast, just irregular. He did have post op atrial fib. He is no longer on coumadin or amiodarone. He has a fair amount of caffeine and wine consumption.   Current Outpatient Prescriptions on File Prior to Visit  Medication Sig Dispense Refill  . Acetaminophen (TYLENOL PO) Take by mouth as needed.        [name] alendronate (FOSAMAX) 70 MG tablet Take 70 mg by mouth every 7 (seven) days. Take in the morning with a full glass of water, on an empty stomach, and do not take anything else by mouth or lie down for the next 30 min.       [name] aspirin 81 MG tablet Take 81 mg by mouth daily.        . brinzolamide (AZOPT) 1 % ophthalmic suspension 1 drop daily.        . calcium carbonate 200 MG capsule Take by mouth 2 (two) times daily with meals.        . docusate sodium (COLACE) 100 MG capsule Take 100 mg by mouth daily.        . fluticasone (FLONASE) 50 MCG/ACT nasal spray 1 spray by Nasal route daily.        [name] GLUCOSAMINE PO Take by mouth 2 (two) times daily.        . metoprolol (LOPRESSOR) 25 MG tablet Take 25 mg by mouth 2 (two) times daily.        . Multiple Vitamin (MULTIVITAMIN) tablet Take 1 tablet by mouth daily.        . Multiple Vitamins-Minerals (ICAPS PO) Take by mouth 2 (two) times daily.        [name] NIACIN, ANTIHYPERLIPIDEMIC, PO Take by mouth at bedtime. 2 TABLETS HS       . Omega-3-acid Ethyl Esters (LOVAZA PO) Take by mouth 3 (three) times daily.        [name] perphenazine-amitriptyline 4-25 MG TABS Take 1 tablet by mouth at  bedtime.        [name] RANITIDINE HCL PO Take by mouth daily.        . rosuvastatin (CRESTOR) 5 MG tablet Take 5 mg by mouth at bedtime.        . traMADol (ULTRAM) 50 MG tablet Take 50 mg by mouth as needed.        . travoprost, benzalkonium, (TRAVATAN) 0.004 % ophthalmic solution Place 1 drop into both eyes at bedtime.        [name] VITAMIN D PO Take by mouth daily.        [name] amoxicillin (AMOXIL) 500 MG capsule Take as directed for dental work        No Known Allergies  Past Medical History  Diagnosis Date  . CAD (coronary artery disease)   . Aortic stenosis   . Atrial fibrillation     POST OP  . Dyslipidemia   . Depression   . Hx of CABG   . Aortic valve prosthesis present  Past Surgical History  Procedure Date  . Coronary artery bypass graft October 2011    LIMA to LAD  . Aortic valve replacement October 2011    Magna Ease pericardial tissue valve #60mm  . Cataract extraction   . Corneal transplant   . Back surgery   . Tonsillectomy     History  Smoking status  . Former Smoker -- 1.0 packs/day for 40 years  . Types: Cigarettes  . Quit date: 11/16/1990  Smokeless tobacco  . Never Used    History  Alcohol Use  . 1.2 oz/week  . 2 Glasses of wine per week    Family History  Problem Relation Age of Onset  . Heart attack Father     Review of Systems: The review of systems is positive for some arthritis.  All other systems were reviewed and are negative.  Physical Exam: BP 118/58  Pulse 71  Ht 5\' 10"  (1.778 m)  Wt 189 lb 6 oz (85.9 kg)  BMI 27.17 kg/m2 Patient is very pleasant and in no acute distress. Skin is warm and dry. Color is normal.  HEENT is unremarkable. Normocephalic/atraumatic. PERRL. Sclera are nonicteric. Neck is supple. No masses. No JVD. Lungs are clear. Cardiac exam shows a regular rate and rhythm. He has a soft outflow murmur.  He does have an occasional ectopic that was not picked up on EKG today. Abdomen is soft. Extremities are without edema.  Gait and ROM are intact. No gross neurologic deficits noted.  LABORATORY DATA:  EKG shows sinus and is unchanged   Assessment / Plan:

## 2011-01-24 NOTE — Patient Instructions (Signed)
We are going to place a monitor to watch your heart rhythm for the next one month. For now, keep your appointment in September with Dr. Swaziland.  We will call you if there are any problems in the meantime Try to cut back on caffeine and alcohol use.

## 2011-01-24 NOTE — Assessment & Plan Note (Signed)
He is in sinus by EKG today. We will see what the event monitor shows.

## 2011-01-24 NOTE — Assessment & Plan Note (Signed)
His wife is worried about the possibility of recurrent atrial fib. I suspect he is having PVC's +/or PAC's. I have placed an event monitor for one month. He is for complete lab work with Dr. Waynard Edwards here in June so I will defer that to Dr. Waynard Edwards.  I have asked him to cut back on his caffeine and alcohol consumption. We will tentatively see him back at his regular appointment in September with Dr. Swaziland, unless the monitor demonstrates other findings that need attention. Patient and his wife are agreeable to this plan and will call if any problems develop in the interim.

## 2011-01-30 ENCOUNTER — Other Ambulatory Visit: Payer: Self-pay | Admitting: Cardiology

## 2011-01-30 NOTE — Telephone Encounter (Signed)
escribe medication per fax request  

## 2011-02-01 ENCOUNTER — Telehealth: Payer: Self-pay | Admitting: Cardiology

## 2011-02-01 NOTE — Telephone Encounter (Signed)
PT'S WIFE SAID THEY HAVE HAD PROBLEM AFTER PROBLEM WITH THE MONITOR PT IS WEARING THAT LORI ASK HIM TO WEAR. THEY WANT TO KNOW IF IT'S REALLY NECESSARY. SHE STATED THAT [facility] HAS NOT BEEN HELPFUL IN TROUBLESHOOTING.

## 2011-02-01 NOTE — Telephone Encounter (Signed)
Wife called stating they are having problems with event monitor. Has spoken w/ [facility] multiple times with the problems of monitor "beeping" all the time. Has been changing batteries and they have sent him extra batteries and monitor still beeping. Wants to stop wearing monitor. Advised to try to keep it on until Monday when Dr. Swaziland returns. Also he is going on a fishing trip end of next week and does not want to be wearing monitor. Will call her on Monday.

## 2011-02-03 ENCOUNTER — Other Ambulatory Visit: Payer: Self-pay | Admitting: Cardiology

## 2011-02-03 NOTE — Telephone Encounter (Signed)
Fax received from pharmacy. Refill completed. Jodette Camella Seim RN  

## 2011-02-06 DIAGNOSIS — R002 Palpitations: Secondary | ICD-10-CM

## 2011-02-06 NOTE — Telephone Encounter (Signed)
Per Dr. Swaziland may stop wearing event monitor. Have seen enough recordings that have been PVC's. Advised to return monitor to [facility].

## 2011-02-15 ENCOUNTER — Telehealth: Payer: Self-pay | Admitting: *Deleted

## 2011-02-15 NOTE — Telephone Encounter (Signed)
Advised of 30 day monitor results (no pauses, occ PVC's/PAC'S).

## 2011-02-17 ENCOUNTER — Encounter: Payer: Self-pay | Admitting: Cardiology

## 2011-05-11 ENCOUNTER — Ambulatory Visit (INDEPENDENT_AMBULATORY_CARE_PROVIDER_SITE_OTHER): Payer: Medicare Other | Admitting: Cardiology

## 2011-05-11 ENCOUNTER — Encounter: Payer: Self-pay | Admitting: Cardiology

## 2011-05-11 DIAGNOSIS — E785 Hyperlipidemia, unspecified: Secondary | ICD-10-CM

## 2011-05-11 DIAGNOSIS — I2510 Atherosclerotic heart disease of native coronary artery without angina pectoris: Secondary | ICD-10-CM

## 2011-05-11 DIAGNOSIS — I359 Nonrheumatic aortic valve disorder, unspecified: Secondary | ICD-10-CM

## 2011-05-11 NOTE — Assessment & Plan Note (Signed)
He still has mild elevation of his triglycerides. He is on appropriate therapy with Lovaza and niacin along with his Crestor. He'll continue on current therapy.

## 2011-05-11 NOTE — Assessment & Plan Note (Signed)
He is status post aortic valve replacement with a tissue valve in October of 2011. Followup echocardiogram in November of 2011 showed a normally functioning prosthesis. He is asymptomatic.

## 2011-05-11 NOTE — Progress Notes (Signed)
Jason [name] Date of Birth: 1925/11/07   History of Present Illness: Jason [name] is seen today for a followup visit. He reports that he is doing very well. He has active exercising at the gym. He reports a recent bone density study had improved and he is no longer taking alendronate. He did have followup lab work in June which looked excellent with the exception of mild elevation of his triglycerides. He denies any chest pain or shortness of breath. He's had no palpitations or dizziness.   Current Outpatient Prescriptions on File Prior to Visit  Medication Sig Dispense Refill  . Acetaminophen (TYLENOL PO) Take by mouth as needed.        [name] amoxicillin (AMOXIL) 500 MG capsule Take as directed for dental work      . aspirin 81 MG tablet Take 81 mg by mouth daily.        . brinzolamide (AZOPT) 1 % ophthalmic suspension 1 drop daily.        . calcium carbonate 200 MG capsule Take by mouth daily.       . CRESTOR 5 MG tablet TAKE 1 TABLET AT BEDTIME  30 tablet  5  . docusate sodium (COLACE) 100 MG capsule Take 100 mg by mouth daily.        . fluticasone (FLONASE) 50 MCG/ACT nasal spray 1 spray by Nasal route daily.        [name] GLUCOSAMINE PO Take by mouth 2 (two) times daily.        . metoprolol tartrate (LOPRESSOR) 25 MG tablet TAKE 1 TABLET TWICE A DAY  180 tablet  1  . Multiple Vitamin (MULTIVITAMIN) tablet Take 1 tablet by mouth daily.        . Multiple Vitamins-Minerals (ICAPS PO) Take by mouth 2 (two) times daily.        [name] NIACIN, ANTIHYPERLIPIDEMIC, PO Take by mouth at bedtime. 2 TABLETS HS       . Omega-3-acid Ethyl Esters (LOVAZA PO) Take by mouth 3 (three) times daily.        [name] perphenazine-amitriptyline 4-25 MG TABS Take 1 tablet by mouth at bedtime.        [name] RANITIDINE HCL PO Take by mouth daily.        . traMADol (ULTRAM) 50 MG tablet Take 50 mg by mouth as needed.        . travoprost, benzalkonium, (TRAVATAN) 0.004 % ophthalmic solution Place 1 drop into both eyes at bedtime.         [name] VITAMIN D PO Take by mouth daily.          No Known Allergies  Past Medical History  Diagnosis Date  . CAD (coronary artery disease)   . Aortic stenosis   . Atrial fibrillation     POST OP  . Dyslipidemia   . Depression   . Hx of CABG   . Aortic valve prosthesis present     Past Surgical History  Procedure Date  . Coronary artery bypass graft October 2011    LIMA to LAD  . Aortic valve replacement October 2011    Magna Ease pericardial tissue valve #11mm  . Cataract extraction   . Corneal transplant   . Back surgery   . Tonsillectomy     History  Smoking status  . Former Smoker -- 1.0 packs/day for 40 years  . Types: Cigarettes  . Quit date: 11/16/1990  Smokeless tobacco  . Never Used    History  Alcohol Use  . 1.2 oz/week  . 2 Glasses of wine per week    Family History  Problem Relation Age of Onset  . Heart attack Father     Review of Systems: The review of systems is positive for some arthritis.  All other systems were reviewed and are negative.  Physical Exam: BP 128/80  Pulse 60  Wt 188 lb (85.276 kg) Patient is very pleasant and in no acute distress. Skin is warm and dry. Color is normal.  HEENT is unremarkable. Normocephalic/atraumatic. PERRL. Sclera are nonicteric. Neck is supple. No masses. No JVD. Lungs are clear. Cardiac exam shows a regular rate and rhythm. occasional extrasystole.  He has a soft outflow murmur.  He does have an occasional ectopic that was not picked up on EKG today. Abdomen is soft. Extremities are without edema. Gait and ROM are intact. No gross neurologic deficits noted.  LABORATORY DATA:     Assessment / Plan:

## 2011-05-11 NOTE — Assessment & Plan Note (Signed)
Status post CABG x1 in October 2011 with an LIMA graft to LAD. He is asymptomatic.

## 2011-05-11 NOTE — Patient Instructions (Signed)
Continue your current medications.  Stay active.  I will see you again in 6 months.

## 2011-08-01 ENCOUNTER — Other Ambulatory Visit: Payer: Self-pay | Admitting: Cardiology

## 2011-09-26 DIAGNOSIS — H35059 Retinal neovascularization, unspecified, unspecified eye: Secondary | ICD-10-CM | POA: Diagnosis not present

## 2011-09-26 DIAGNOSIS — H353290 Exudative age-related macular degeneration, unspecified eye, stage unspecified: Secondary | ICD-10-CM | POA: Diagnosis not present

## 2011-10-10 DIAGNOSIS — C61 Malignant neoplasm of prostate: Secondary | ICD-10-CM | POA: Diagnosis not present

## 2011-10-17 DIAGNOSIS — C61 Malignant neoplasm of prostate: Secondary | ICD-10-CM | POA: Diagnosis not present

## 2011-11-02 DIAGNOSIS — H35059 Retinal neovascularization, unspecified, unspecified eye: Secondary | ICD-10-CM | POA: Diagnosis not present

## 2011-11-02 DIAGNOSIS — H353290 Exudative age-related macular degeneration, unspecified eye, stage unspecified: Secondary | ICD-10-CM | POA: Diagnosis not present

## 2011-11-03 ENCOUNTER — Other Ambulatory Visit: Payer: Self-pay

## 2011-11-03 MED ORDER — ROSUVASTATIN CALCIUM 5 MG PO TABS: 5.0000 mg | ORAL_TABLET | Freq: Every day | ORAL | Status: DC

## 2011-11-15 ENCOUNTER — Ambulatory Visit: Payer: Medicare Other | Admitting: Cardiology

## 2011-12-06 DIAGNOSIS — H35059 Retinal neovascularization, unspecified, unspecified eye: Secondary | ICD-10-CM | POA: Diagnosis not present

## 2011-12-06 DIAGNOSIS — H353290 Exudative age-related macular degeneration, unspecified eye, stage unspecified: Secondary | ICD-10-CM | POA: Diagnosis not present

## 2011-12-06 DIAGNOSIS — H3570 Unspecified separation of retinal layers: Secondary | ICD-10-CM | POA: Diagnosis not present

## 2011-12-12 ENCOUNTER — Encounter: Payer: Self-pay | Admitting: Cardiology

## 2011-12-12 ENCOUNTER — Ambulatory Visit (INDEPENDENT_AMBULATORY_CARE_PROVIDER_SITE_OTHER): Payer: Medicare Other | Admitting: Cardiology

## 2011-12-12 VITALS — BP 110/68 | HR 74 | Ht 71.0 in | Wt 194.0 lb

## 2011-12-12 DIAGNOSIS — I4949 Other premature depolarization: Secondary | ICD-10-CM | POA: Diagnosis not present

## 2011-12-12 DIAGNOSIS — R5381 Other malaise: Secondary | ICD-10-CM

## 2011-12-12 DIAGNOSIS — R5383 Other fatigue: Secondary | ICD-10-CM | POA: Insufficient documentation

## 2011-12-12 DIAGNOSIS — E785 Hyperlipidemia, unspecified: Secondary | ICD-10-CM

## 2011-12-12 DIAGNOSIS — I493 Ventricular premature depolarization: Secondary | ICD-10-CM

## 2011-12-12 DIAGNOSIS — Z952 Presence of prosthetic heart valve: Secondary | ICD-10-CM

## 2011-12-12 DIAGNOSIS — I2510 Atherosclerotic heart disease of native coronary artery without angina pectoris: Secondary | ICD-10-CM | POA: Diagnosis not present

## 2011-12-12 DIAGNOSIS — I359 Nonrheumatic aortic valve disorder, unspecified: Secondary | ICD-10-CM

## 2011-12-12 DIAGNOSIS — H4011X0 Primary open-angle glaucoma, stage unspecified: Secondary | ICD-10-CM | POA: Diagnosis not present

## 2011-12-12 DIAGNOSIS — H409 Unspecified glaucoma: Secondary | ICD-10-CM | POA: Diagnosis not present

## 2011-12-12 DIAGNOSIS — Z954 Presence of other heart-valve replacement: Secondary | ICD-10-CM

## 2011-12-12 NOTE — Progress Notes (Signed)
Deeann Saint [place] Date of Birth: May 15, 1926   History of Present Illness: Mr. Diemer is seen today for a followup visit. In general he is doing very well. He does complain of more fatigue than he had 6 months ago. When he exercises his heart rate will rarely get over 105 beats per minute. Rarely he will get dizzy if he moves quickly or turns his head quickly. He has had no shortness of breath or chest pain. He still remains very active.  Current Outpatient Prescriptions on File Prior to Visit  Medication Sig Dispense Refill  . Acetaminophen (TYLENOL PO) Take by mouth as needed.        [name] amoxicillin (AMOXIL) 500 MG capsule Take as directed for dental work      . aspirin 81 MG tablet Take 81 mg by mouth daily.        . brinzolamide (AZOPT) 1 % ophthalmic suspension 1 drop daily.        . calcium carbonate 200 MG capsule Take by mouth daily.       [name] docusate sodium (COLACE) 100 MG capsule Take 100 mg by mouth daily.        . fluticasone (FLONASE) 50 MCG/ACT nasal spray 1 spray by Nasal route daily.        [name] GLUCOSAMINE PO Take by mouth 2 (two) times daily.        . metoprolol tartrate (LOPRESSOR) 25 MG tablet TAKE 1 TABLET TWICE A DAY  180 tablet  1  . Multiple Vitamin (MULTIVITAMIN) tablet Take 1 tablet by mouth daily.        . Multiple Vitamins-Minerals (ICAPS PO) Take by mouth 2 (two) times daily.        [name] NIACIN, ANTIHYPERLIPIDEMIC, PO Take by mouth at bedtime. 2 TABLETS HS       . Omega-3-acid Ethyl Esters (LOVAZA PO) Take by mouth 3 (three) times daily.        [name] perphenazine-amitriptyline 4-25 MG TABS Take 1 tablet by mouth at bedtime.        [name] RANITIDINE HCL PO Take by mouth daily.        . rosuvastatin (CRESTOR) 5 MG tablet Take 1 tablet (5 mg total) by mouth daily.  30 tablet  2  . traMADol (ULTRAM) 50 MG tablet Take 50 mg by mouth as needed.        . travoprost, benzalkonium, (TRAVATAN) 0.004 % ophthalmic solution Place 1 drop into both eyes at bedtime.        [name] VITAMIN D PO  Take by mouth daily.          No Known Allergies  Past Medical History  Diagnosis Date  . CAD (coronary artery disease)   . Aortic stenosis   . Atrial fibrillation     POST OP  . Dyslipidemia   . Depression   . Hx of CABG   . Aortic valve prosthesis present     Past Surgical History  Procedure Date  . Coronary artery bypass graft October 2011    LIMA to LAD  . Aortic valve replacement October 2011    Magna Ease pericardial tissue valve #21mm  . Cataract extraction   . Corneal transplant   . Back surgery   . Tonsillectomy     History  Smoking status  . Former Smoker -- 1.0 packs/day for 40 years  . Types: Cigarettes  . Quit date: 11/16/1990  Smokeless tobacco  . Never Used    History  Alcohol Use  . 1.2 oz/week  . 2 Glasses of wine per week    Family History  Problem Relation Age of Onset  . Heart attack Father     Review of Systems: The review of systems is positive for macular degeneration. He has been treated with a series of shots with significant improvement.  All other systems were reviewed and are negative.  Physical Exam: BP 110/68  Pulse 74  Ht 5\' 11"  (1.803 m)  Wt 194 lb (87.998 kg)  BMI 27.06 kg/m2 Patient is very pleasant and in no acute distress. Skin is warm and dry. Color is normal.  HEENT is unremarkable. Normocephalic/atraumatic. PERRL. Sclera are nonicteric. Neck is supple. No masses. No JVD. Lungs are clear. Cardiac exam shows a regular rate and rhythm. occasional extrasystole.  He has a soft aortic outflow murmur.   Abdomen is soft. Extremities are without edema. Gait and ROM are intact. No gross neurologic deficits noted.  LABORATORY DATA:   ECG demonstrates normal sinus rhythm with occasional PVCs. 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 This is unchanged from March of 2012.  Assessment / Plan:

## 2011-12-12 NOTE — Assessment & Plan Note (Signed)
Status post CABG with a LIMA to his LAD. He denies any significant anginal symptoms. We will continue with aspirin and lipid-lowering therapy.

## 2011-12-12 NOTE — Assessment & Plan Note (Signed)
He is status post tissue aortic valve replacement in October of 2011. His bowel function is stable on exam. We will continue with aspirin 81 mg daily.

## 2011-12-12 NOTE — Assessment & Plan Note (Signed)
He is on a combination of Crestor, niacin, and fish oil. I will request a copy of his most recent lab work from his primary care.

## 2011-12-12 NOTE — Assessment & Plan Note (Signed)
Some of his fatigue may be related to his beta blocker therapy. I recommended reducing his metoprolol to 25 mg per day. His blood pressure remains controlled we may consider stopping this completely. He does appear to have some chronotropic incompetence when he exercises.

## 2011-12-12 NOTE — Patient Instructions (Signed)
Reduce your metoprolol to 25 mg once a day.  Continue your other medication.  I will get a copy of your lab work from Dr. Waynard Edwards.

## 2011-12-13 ENCOUNTER — Encounter: Payer: Self-pay | Admitting: Cardiology

## 2011-12-14 ENCOUNTER — Ambulatory Visit: Payer: Medicare Other | Admitting: Cardiology

## 2011-12-27 DIAGNOSIS — H353290 Exudative age-related macular degeneration, unspecified eye, stage unspecified: Secondary | ICD-10-CM | POA: Diagnosis not present

## 2011-12-27 DIAGNOSIS — H3570 Unspecified separation of retinal layers: Secondary | ICD-10-CM | POA: Diagnosis not present

## 2012-01-02 ENCOUNTER — Telehealth: Payer: Self-pay | Admitting: Cardiology

## 2012-01-02 DIAGNOSIS — E785 Hyperlipidemia, unspecified: Secondary | ICD-10-CM

## 2012-01-02 DIAGNOSIS — I2510 Atherosclerotic heart disease of native coronary artery without angina pectoris: Secondary | ICD-10-CM

## 2012-01-02 NOTE — Telephone Encounter (Signed)
Pt was asked to have a complete blood work up, and pt thought he did, but hasn't had any since 02-21-2011 and won't have another until September , if dr [place] wants him to have it sooner can get order? pls call wife nancy parrot [phone]

## 2012-01-02 NOTE — Telephone Encounter (Signed)
Patient's wife called stated patient has not had lab work since 6/12.States Dr.Jordan wanted him to have lab work.Patient scheduled for bmet,lipids,hepatic 01/03/12 copy to be sent to PCP.

## 2012-01-03 ENCOUNTER — Other Ambulatory Visit (INDEPENDENT_AMBULATORY_CARE_PROVIDER_SITE_OTHER): Payer: Medicare Other

## 2012-01-03 DIAGNOSIS — E785 Hyperlipidemia, unspecified: Secondary | ICD-10-CM

## 2012-01-03 DIAGNOSIS — I2510 Atherosclerotic heart disease of native coronary artery without angina pectoris: Secondary | ICD-10-CM

## 2012-01-03 LAB — HEPATIC FUNCTION PANEL
Albumin: 4 g/dL (ref 3.5–5.2)
Alkaline Phosphatase: 41 U/L (ref 39–117)
Total Protein: 7.8 g/dL (ref 6.0–8.3)

## 2012-01-03 LAB — LIPID PANEL
Cholesterol: 89 mg/dL (ref 0–200)
HDL: 37.4 mg/dL — ABNORMAL LOW (ref >39.00)
LDL Cholesterol: 27 mg/dL (ref 0–99)
Total CHOL/HDL Ratio: 2
Triglycerides: 125 mg/dL (ref 0.0–149.0)
VLDL: 25 mg/dL (ref 0.0–40.0)

## 2012-01-03 LAB — BASIC METABOLIC PANEL
CO2: 28 mEq/L (ref 19–32)
Chloride: 103 mEq/L (ref 96–112)
Creatinine, Ser: 1 mg/dL (ref 0.4–1.5)
Potassium: 4 mEq/L (ref 3.5–5.1)
Sodium: 139 mEq/L (ref 135–145)

## 2012-01-09 DIAGNOSIS — H35059 Retinal neovascularization, unspecified, unspecified eye: Secondary | ICD-10-CM | POA: Diagnosis not present

## 2012-01-09 DIAGNOSIS — H353290 Exudative age-related macular degeneration, unspecified eye, stage unspecified: Secondary | ICD-10-CM | POA: Diagnosis not present

## 2012-01-16 DIAGNOSIS — H409 Unspecified glaucoma: Secondary | ICD-10-CM | POA: Diagnosis not present

## 2012-01-16 DIAGNOSIS — H353290 Exudative age-related macular degeneration, unspecified eye, stage unspecified: Secondary | ICD-10-CM | POA: Diagnosis not present

## 2012-01-16 DIAGNOSIS — H4011X0 Primary open-angle glaucoma, stage unspecified: Secondary | ICD-10-CM | POA: Diagnosis not present

## 2012-02-02 ENCOUNTER — Other Ambulatory Visit: Payer: Self-pay | Admitting: Cardiology

## 2012-02-02 MED ORDER — ROSUVASTATIN CALCIUM 5 MG PO TABS: 5.0000 mg | ORAL_TABLET | Freq: Every day | ORAL | Status: DC

## 2012-02-14 DIAGNOSIS — H353290 Exudative age-related macular degeneration, unspecified eye, stage unspecified: Secondary | ICD-10-CM | POA: Diagnosis not present

## 2012-02-14 DIAGNOSIS — H35059 Retinal neovascularization, unspecified, unspecified eye: Secondary | ICD-10-CM | POA: Diagnosis not present

## 2012-03-18 ENCOUNTER — Other Ambulatory Visit: Payer: Self-pay | Admitting: *Deleted

## 2012-03-18 MED ORDER — METOPROLOL TARTRATE 25 MG PO TABS: 25.0000 mg | ORAL_TABLET | Freq: Two times a day (BID) | ORAL | Status: DC

## 2012-03-18 NOTE — Telephone Encounter (Signed)
Refilled metoprolol 

## 2012-03-20 DIAGNOSIS — H353290 Exudative age-related macular degeneration, unspecified eye, stage unspecified: Secondary | ICD-10-CM | POA: Diagnosis not present

## 2012-03-20 DIAGNOSIS — H35059 Retinal neovascularization, unspecified, unspecified eye: Secondary | ICD-10-CM | POA: Diagnosis not present

## 2012-04-12 DIAGNOSIS — C61 Malignant neoplasm of prostate: Secondary | ICD-10-CM | POA: Diagnosis not present

## 2012-04-18 DIAGNOSIS — N138 Other obstructive and reflux uropathy: Secondary | ICD-10-CM | POA: Diagnosis not present

## 2012-04-18 DIAGNOSIS — N401 Enlarged prostate with lower urinary tract symptoms: Secondary | ICD-10-CM | POA: Diagnosis not present

## 2012-04-18 DIAGNOSIS — C61 Malignant neoplasm of prostate: Secondary | ICD-10-CM | POA: Diagnosis not present

## 2012-04-23 DIAGNOSIS — L608 Other nail disorders: Secondary | ICD-10-CM | POA: Diagnosis not present

## 2012-04-24 DIAGNOSIS — H35059 Retinal neovascularization, unspecified, unspecified eye: Secondary | ICD-10-CM | POA: Diagnosis not present

## 2012-04-24 DIAGNOSIS — H353290 Exudative age-related macular degeneration, unspecified eye, stage unspecified: Secondary | ICD-10-CM | POA: Diagnosis not present

## 2012-05-17 ENCOUNTER — Encounter: Payer: Self-pay | Admitting: Cardiology

## 2012-05-17 DIAGNOSIS — M949 Disorder of cartilage, unspecified: Secondary | ICD-10-CM | POA: Diagnosis not present

## 2012-05-17 DIAGNOSIS — E785 Hyperlipidemia, unspecified: Secondary | ICD-10-CM | POA: Diagnosis not present

## 2012-05-17 DIAGNOSIS — M899 Disorder of bone, unspecified: Secondary | ICD-10-CM | POA: Diagnosis not present

## 2012-05-17 DIAGNOSIS — Z125 Encounter for screening for malignant neoplasm of prostate: Secondary | ICD-10-CM | POA: Diagnosis not present

## 2012-05-17 DIAGNOSIS — Z79899 Other long term (current) drug therapy: Secondary | ICD-10-CM | POA: Diagnosis not present

## 2012-05-20 DIAGNOSIS — H4011X0 Primary open-angle glaucoma, stage unspecified: Secondary | ICD-10-CM | POA: Diagnosis not present

## 2012-05-20 DIAGNOSIS — H409 Unspecified glaucoma: Secondary | ICD-10-CM | POA: Diagnosis not present

## 2012-05-23 DIAGNOSIS — Z1212 Encounter for screening for malignant neoplasm of rectum: Secondary | ICD-10-CM | POA: Diagnosis not present

## 2012-05-24 DIAGNOSIS — R5383 Other fatigue: Secondary | ICD-10-CM | POA: Diagnosis not present

## 2012-05-24 DIAGNOSIS — Z79899 Other long term (current) drug therapy: Secondary | ICD-10-CM | POA: Diagnosis not present

## 2012-05-24 DIAGNOSIS — Z23 Encounter for immunization: Secondary | ICD-10-CM | POA: Diagnosis not present

## 2012-05-24 DIAGNOSIS — I4891 Unspecified atrial fibrillation: Secondary | ICD-10-CM | POA: Diagnosis not present

## 2012-05-24 DIAGNOSIS — E291 Testicular hypofunction: Secondary | ICD-10-CM | POA: Diagnosis not present

## 2012-05-24 DIAGNOSIS — F329 Major depressive disorder, single episode, unspecified: Secondary | ICD-10-CM | POA: Diagnosis not present

## 2012-05-24 DIAGNOSIS — R5381 Other malaise: Secondary | ICD-10-CM | POA: Diagnosis not present

## 2012-05-24 DIAGNOSIS — F3289 Other specified depressive episodes: Secondary | ICD-10-CM | POA: Diagnosis not present

## 2012-05-24 DIAGNOSIS — Z0000 Encounter for general adult medical examination without abnormal findings: Secondary | ICD-10-CM | POA: Diagnosis not present

## 2012-05-29 DIAGNOSIS — H35059 Retinal neovascularization, unspecified, unspecified eye: Secondary | ICD-10-CM | POA: Diagnosis not present

## 2012-05-29 DIAGNOSIS — H353290 Exudative age-related macular degeneration, unspecified eye, stage unspecified: Secondary | ICD-10-CM | POA: Diagnosis not present

## 2012-06-03 ENCOUNTER — Encounter: Payer: Self-pay | Admitting: Cardiology

## 2012-06-03 ENCOUNTER — Ambulatory Visit (INDEPENDENT_AMBULATORY_CARE_PROVIDER_SITE_OTHER): Payer: Medicare Other | Admitting: Cardiology

## 2012-06-03 VITALS — BP 110/64 | HR 88 | Ht 71.0 in | Wt 187.8 lb

## 2012-06-03 DIAGNOSIS — I359 Nonrheumatic aortic valve disorder, unspecified: Secondary | ICD-10-CM

## 2012-06-03 DIAGNOSIS — R5381 Other malaise: Secondary | ICD-10-CM | POA: Diagnosis not present

## 2012-06-03 DIAGNOSIS — R5383 Other fatigue: Secondary | ICD-10-CM

## 2012-06-03 DIAGNOSIS — E785 Hyperlipidemia, unspecified: Secondary | ICD-10-CM | POA: Diagnosis not present

## 2012-06-03 DIAGNOSIS — I2510 Atherosclerotic heart disease of native coronary artery without angina pectoris: Secondary | ICD-10-CM

## 2012-06-03 NOTE — Patient Instructions (Addendum)
Continue your current therapy  I will see you again in 6 months.   

## 2012-06-03 NOTE — Progress Notes (Signed)
Jason [name] Date of Birth: [birth year]   History of Present Illness: Jason [name] is seen today for a followup visit. He is status post aortic valve replacement and coronary bypass surgery in October of 2011. He has a tissue valve prosthesis. Since his last visit he has felt much better with reduction in his metoprolol dose. His energy level has improved. He is not as fatigued. He was recently started on B12. He has no known prostate cancer with an elevated PSA and this is being managed conservatively. He remains active.  Current Outpatient Prescriptions on File Prior to Visit  Medication Sig Dispense Refill  . Acetaminophen (TYLENOL PO) Take by mouth as needed.        [name] amoxicillin (AMOXIL) 500 MG capsule Take as directed for dental work      . aspirin 81 MG tablet Take 81 mg by mouth daily.        . brinzolamide (AZOPT) 1 % ophthalmic suspension 1 drop daily.        . calcium carbonate 200 MG capsule Take by mouth daily.       [name] docusate sodium (COLACE) 100 MG capsule Take 100 mg by mouth daily.        . fluticasone (FLONASE) 50 MCG/ACT nasal spray 1 spray by Nasal route daily.        [name] GLUCOSAMINE PO Take by mouth 2 (two) times daily.        . metoprolol tartrate (LOPRESSOR) 25 MG tablet Take 1 tablet (25 mg total) by mouth 2 (two) times daily.  180 tablet  3  . Multiple Vitamin (MULTIVITAMIN) tablet Take 1 tablet by mouth daily.        . Multiple Vitamins-Minerals (ICAPS PO) Take by mouth 2 (two) times daily.        [name] NIACIN, ANTIHYPERLIPIDEMIC, PO Take by mouth at bedtime. 2 TABLETS HS       . Omega-3-acid Ethyl Esters (LOVAZA PO) Take by mouth daily.       [name] perphenazine-amitriptyline 4-25 MG TABS Take 1 tablet by mouth at bedtime.        [name] RANITIDINE HCL PO Take by mouth daily.        . rosuvastatin (CRESTOR) 5 MG tablet Take 1 tablet (5 mg total) by mouth daily.  30 tablet  5  . traMADol (ULTRAM) 50 MG tablet Take 50 mg by mouth as needed.        [name] VITAMIN D PO Take by  mouth daily.          No Known Allergies  Past Medical History  Diagnosis Date  . CAD (coronary artery disease)   . Aortic stenosis   . Atrial fibrillation     POST OP  . Dyslipidemia   . Depression   . Hx of CABG   . Aortic valve prosthesis present     Past Surgical History  Procedure Date  . Coronary artery bypass graft October 2011    LIMA to LAD  . Aortic valve replacement October 2011    Magna Ease pericardial tissue valve #66mm  . Cataract extraction   . Corneal transplant   . Back surgery   . Tonsillectomy     History  Smoking status  . Former Smoker -- 1.0 packs/day for 40 years  . Types: Cigarettes  . Quit date: 11/16/1990  Smokeless tobacco  . Never Used    History  Alcohol Use  . 1.2 oz/week  . 2 Glasses of  wine per week    Family History  Problem Relation Age of Onset  . Heart attack Father     Review of Systems: The review of systems is positive for macular degeneration.  All other systems were reviewed and are negative.  Physical Exam: BP 110/64  Pulse 88  Ht 5\' 11"  (1.803 m)  Wt 187 lb 12.8 oz (85.186 kg)  BMI 26.19 kg/m2  SpO2 94% Patient is very pleasant and in no acute distress. Skin is warm and dry. Color is normal.  HEENT is unremarkable. Normocephalic/atraumatic. PERRL. Sclera are nonicteric. Neck is supple. No masses. No JVD. Lungs are clear. Cardiac exam shows a regular rate and rhythm. occasional extrasystole.  He has a soft aortic outflow murmur.   Abdomen is soft. Extremities are without edema. Gait and ROM are intact. No gross neurologic deficits noted.  LABORATORY DATA:   Laboratory data from 05/24/2012 shows a normal chemistry panel and CBC. Total cholesterol 110, triglycerides 137, HDL 36, LDL 47. TSH was normal. A1c was 5.5%.  Assessment / Plan: 1. Status post tissue aortic valve replacement for severe aortic stenosis. Clinically doing well. I'll followup again in 6 months.  2. Coronary disease status post CABG with an  LIMA graft to the LAD in October of 2011. Currently no anginal symptoms.  3. Dyslipidemia well controlled on Crestor, niacin, and fish oil.  4. Fatigue. Symptoms have improved with reduction in his metoprolol dose.

## 2012-06-14 DIAGNOSIS — Z23 Encounter for immunization: Secondary | ICD-10-CM | POA: Diagnosis not present

## 2012-07-02 DIAGNOSIS — H4011X0 Primary open-angle glaucoma, stage unspecified: Secondary | ICD-10-CM | POA: Diagnosis not present

## 2012-07-02 DIAGNOSIS — H409 Unspecified glaucoma: Secondary | ICD-10-CM | POA: Diagnosis not present

## 2012-07-08 DIAGNOSIS — H353290 Exudative age-related macular degeneration, unspecified eye, stage unspecified: Secondary | ICD-10-CM | POA: Diagnosis not present

## 2012-07-08 DIAGNOSIS — H35359 Cystoid macular degeneration, unspecified eye: Secondary | ICD-10-CM | POA: Diagnosis not present

## 2012-07-08 DIAGNOSIS — H3570 Unspecified separation of retinal layers: Secondary | ICD-10-CM | POA: Diagnosis not present

## 2012-07-15 ENCOUNTER — Encounter: Payer: Self-pay | Admitting: Cardiology

## 2012-08-12 DIAGNOSIS — H353290 Exudative age-related macular degeneration, unspecified eye, stage unspecified: Secondary | ICD-10-CM | POA: Diagnosis not present

## 2012-08-12 DIAGNOSIS — H35359 Cystoid macular degeneration, unspecified eye: Secondary | ICD-10-CM | POA: Diagnosis not present

## 2012-08-12 DIAGNOSIS — H35059 Retinal neovascularization, unspecified, unspecified eye: Secondary | ICD-10-CM | POA: Diagnosis not present

## 2012-09-18 ENCOUNTER — Other Ambulatory Visit: Payer: Self-pay

## 2012-09-18 MED ORDER — ROSUVASTATIN CALCIUM 5 MG PO TABS: 5.0000 mg | ORAL_TABLET | Freq: Every day | ORAL | Status: DC

## 2012-09-23 DIAGNOSIS — H353290 Exudative age-related macular degeneration, unspecified eye, stage unspecified: Secondary | ICD-10-CM | POA: Diagnosis not present

## 2012-09-23 DIAGNOSIS — H35059 Retinal neovascularization, unspecified, unspecified eye: Secondary | ICD-10-CM | POA: Diagnosis not present

## 2012-09-23 DIAGNOSIS — H35359 Cystoid macular degeneration, unspecified eye: Secondary | ICD-10-CM | POA: Diagnosis not present

## 2012-10-14 DIAGNOSIS — C61 Malignant neoplasm of prostate: Secondary | ICD-10-CM | POA: Diagnosis not present

## 2012-10-18 DIAGNOSIS — C61 Malignant neoplasm of prostate: Secondary | ICD-10-CM | POA: Diagnosis not present

## 2012-10-18 DIAGNOSIS — R972 Elevated prostate specific antigen [PSA]: Secondary | ICD-10-CM | POA: Diagnosis not present

## 2012-10-25 DIAGNOSIS — I4949 Other premature depolarization: Secondary | ICD-10-CM | POA: Diagnosis not present

## 2012-10-25 DIAGNOSIS — I4891 Unspecified atrial fibrillation: Secondary | ICD-10-CM | POA: Diagnosis not present

## 2012-10-25 DIAGNOSIS — H8110 Benign paroxysmal vertigo, unspecified ear: Secondary | ICD-10-CM | POA: Diagnosis not present

## 2012-11-08 DIAGNOSIS — H353290 Exudative age-related macular degeneration, unspecified eye, stage unspecified: Secondary | ICD-10-CM | POA: Diagnosis not present

## 2012-11-08 DIAGNOSIS — H35059 Retinal neovascularization, unspecified, unspecified eye: Secondary | ICD-10-CM | POA: Diagnosis not present

## 2012-11-12 DIAGNOSIS — H409 Unspecified glaucoma: Secondary | ICD-10-CM | POA: Diagnosis not present

## 2012-11-12 DIAGNOSIS — H4011X0 Primary open-angle glaucoma, stage unspecified: Secondary | ICD-10-CM | POA: Diagnosis not present

## 2012-11-27 DIAGNOSIS — F3289 Other specified depressive episodes: Secondary | ICD-10-CM | POA: Diagnosis not present

## 2012-11-27 DIAGNOSIS — F329 Major depressive disorder, single episode, unspecified: Secondary | ICD-10-CM | POA: Diagnosis not present

## 2012-11-27 DIAGNOSIS — E291 Testicular hypofunction: Secondary | ICD-10-CM | POA: Diagnosis not present

## 2012-11-27 DIAGNOSIS — E538 Deficiency of other specified B group vitamins: Secondary | ICD-10-CM | POA: Diagnosis not present

## 2012-11-27 DIAGNOSIS — G4733 Obstructive sleep apnea (adult) (pediatric): Secondary | ICD-10-CM | POA: Diagnosis not present

## 2012-11-27 DIAGNOSIS — C61 Malignant neoplasm of prostate: Secondary | ICD-10-CM | POA: Diagnosis not present

## 2012-11-27 DIAGNOSIS — I4891 Unspecified atrial fibrillation: Secondary | ICD-10-CM | POA: Diagnosis not present

## 2012-11-27 DIAGNOSIS — R7301 Impaired fasting glucose: Secondary | ICD-10-CM | POA: Diagnosis not present

## 2012-12-05 ENCOUNTER — Ambulatory Visit (INDEPENDENT_AMBULATORY_CARE_PROVIDER_SITE_OTHER): Payer: Medicare Other | Admitting: Cardiology

## 2012-12-05 ENCOUNTER — Encounter: Payer: Self-pay | Admitting: Cardiology

## 2012-12-05 VITALS — BP 129/43 | HR 86 | Ht 71.0 in | Wt 187.0 lb

## 2012-12-05 DIAGNOSIS — I4949 Other premature depolarization: Secondary | ICD-10-CM

## 2012-12-05 DIAGNOSIS — I2510 Atherosclerotic heart disease of native coronary artery without angina pectoris: Secondary | ICD-10-CM

## 2012-12-05 DIAGNOSIS — E785 Hyperlipidemia, unspecified: Secondary | ICD-10-CM

## 2012-12-05 DIAGNOSIS — I359 Nonrheumatic aortic valve disorder, unspecified: Secondary | ICD-10-CM

## 2012-12-05 DIAGNOSIS — I493 Ventricular premature depolarization: Secondary | ICD-10-CM | POA: Insufficient documentation

## 2012-12-05 NOTE — Patient Instructions (Signed)
Continue your current medication  Call me if you have more dizzy spells.  I will see you in 6 months.

## 2012-12-05 NOTE — Progress Notes (Signed)
Deeann Saint Duck Date of Birth: 02-Jul-1926   History of Present Illness: Mr. Artley is seen today for a followup visit. He is status post aortic valve replacement and coronary bypass surgery in October of 2011. He has a tissue valve prosthesis. He reports he is feeling very well. 2 months ago while exercising on a exercise bike he had an episode of dizziness and broke out in a cold sweat. This resolved with rest. He has had no recurrent symptoms. He was seen by his primary care an ECG was done. He denies any chest pain or shortness of breath.  Current Outpatient Prescriptions on File Prior to Visit  Medication Sig Dispense Refill  . Acetaminophen (TYLENOL PO) Take by mouth as needed.        [name] amoxicillin (AMOXIL) 500 MG capsule Take as directed for dental work      . aspirin 81 MG tablet Take 81 mg by mouth daily.        . brinzolamide (AZOPT) 1 % ophthalmic suspension 1 drop daily. 1 drop both eyes daily      . calcium carbonate 200 MG capsule Take by mouth daily.       [name] docusate sodium (COLACE) 100 MG capsule Take 100 mg by mouth daily.        . fluticasone (FLONASE) 50 MCG/ACT nasal spray 1 spray by Nasal route daily.        [name] GLUCOSAMINE PO Take by mouth 2 (two) times daily.        . Multiple Vitamin (MULTIVITAMIN) tablet Take 1 tablet by mouth daily.        . Multiple Vitamins-Minerals (ICAPS PO) Take by mouth 2 (two) times daily.        [name] NIACIN, ANTIHYPERLIPIDEMIC, PO Take by mouth at bedtime. 2 TABLETS HS       . Omega-3-acid Ethyl Esters (LOVAZA PO) Take by mouth daily.       [name] perphenazine-amitriptyline 4-25 MG TABS Take 1 tablet by mouth at bedtime.        [name] RANITIDINE HCL PO Take by mouth daily.        . rosuvastatin (CRESTOR) 5 MG tablet Take 1 tablet (5 mg total) by mouth daily.  30 tablet  5  . Tafluprost (ZIOPTAN) 0.0015 % SOLN Apply to eye.      [name] VITAMIN D PO Take by mouth daily.         No current facility-administered medications on file prior to visit.    No  Known Allergies  Past Medical History  Diagnosis Date  . CAD (coronary artery disease)   . Aortic stenosis   . Atrial fibrillation     POST OP  . Dyslipidemia   . Depression   . Hx of CABG   . Aortic valve prosthesis present     Past Surgical History  Procedure Laterality Date  . Coronary artery bypass graft  October 2011    LIMA to LAD  . Aortic valve replacement  October 2011    Magna Ease pericardial tissue valve #39mm  . Cataract extraction    . Corneal transplant    . Back surgery    . Tonsillectomy      History  Smoking status  . Former Smoker -- 1.00 packs/day for 40 years  . Types: Cigarettes  . Quit date: 11/16/1990  Smokeless tobacco  . Never Used    History  Alcohol Use  . 1.2 oz/week  . 2 Glasses of wine  per week    Family History  Problem Relation Age of Onset  . Heart attack Father     Review of Systems: The review of systems is positive for macular degeneration.  All other systems were reviewed and are negative.  Physical Exam: BP 129/43  Pulse 86  Ht 5\' 11"  (1.803 m)  Wt 187 lb (84.823 kg)  BMI 26.09 kg/m2  SpO2 97% Patient is very pleasant and in no acute distress. Skin is warm and dry. Color is normal.  HEENT is unremarkable. Normocephalic/atraumatic. PERRL. Sclera are nonicteric. Neck is supple. No masses. No JVD. Lungs are clear. Cardiac exam shows a regularly irregular rate and rhythm. Frequent extrasystole.  He has a soft aortic outflow murmur.   Abdomen is soft. Extremities are without edema. Gait and ROM are intact. No gross neurologic deficits noted.  LABORATORY DATA:     Assessment / Plan: 1. Status post tissue aortic valve replacement for severe aortic stenosis. Clinically doing well. I'll followup again in 6 months.  2. Coronary disease status post CABG with an LIMA graft to the LAD in October of 2011. Currently no anginal symptoms.  3. Dyslipidemia well controlled on Crestor, niacin, and fish oil.  4. PVCs. These are  chronic. He is on low-dose metoprolol. Higher doses of metoprolol resulted in significant fatigue. We will continue to monitor for now.  5. Episode of dizziness. Etiology is unclear. If he has recurrence may need to consider an event monitor.

## 2013-01-01 DIAGNOSIS — H353290 Exudative age-related macular degeneration, unspecified eye, stage unspecified: Secondary | ICD-10-CM | POA: Diagnosis not present

## 2013-01-01 DIAGNOSIS — H35059 Retinal neovascularization, unspecified, unspecified eye: Secondary | ICD-10-CM | POA: Diagnosis not present

## 2013-01-28 DIAGNOSIS — H4011X0 Primary open-angle glaucoma, stage unspecified: Secondary | ICD-10-CM | POA: Diagnosis not present

## 2013-01-28 DIAGNOSIS — H409 Unspecified glaucoma: Secondary | ICD-10-CM | POA: Diagnosis not present

## 2013-01-31 DIAGNOSIS — H4011X0 Primary open-angle glaucoma, stage unspecified: Secondary | ICD-10-CM | POA: Diagnosis not present

## 2013-01-31 DIAGNOSIS — H409 Unspecified glaucoma: Secondary | ICD-10-CM | POA: Diagnosis not present

## 2013-04-01 DIAGNOSIS — H353290 Exudative age-related macular degeneration, unspecified eye, stage unspecified: Secondary | ICD-10-CM | POA: Diagnosis not present

## 2013-04-01 DIAGNOSIS — H35059 Retinal neovascularization, unspecified, unspecified eye: Secondary | ICD-10-CM | POA: Diagnosis not present

## 2013-04-15 DIAGNOSIS — C61 Malignant neoplasm of prostate: Secondary | ICD-10-CM | POA: Diagnosis not present

## 2013-04-22 DIAGNOSIS — N401 Enlarged prostate with lower urinary tract symptoms: Secondary | ICD-10-CM | POA: Diagnosis not present

## 2013-04-22 DIAGNOSIS — N138 Other obstructive and reflux uropathy: Secondary | ICD-10-CM | POA: Diagnosis not present

## 2013-04-22 DIAGNOSIS — C61 Malignant neoplasm of prostate: Secondary | ICD-10-CM | POA: Diagnosis not present

## 2013-05-06 DIAGNOSIS — L608 Other nail disorders: Secondary | ICD-10-CM | POA: Diagnosis not present

## 2013-05-08 DIAGNOSIS — Z23 Encounter for immunization: Secondary | ICD-10-CM | POA: Diagnosis not present

## 2013-05-12 ENCOUNTER — Other Ambulatory Visit: Payer: Self-pay | Admitting: Cardiology

## 2013-05-27 ENCOUNTER — Encounter: Payer: Self-pay | Admitting: Cardiology

## 2013-05-27 DIAGNOSIS — R7301 Impaired fasting glucose: Secondary | ICD-10-CM | POA: Diagnosis not present

## 2013-05-27 DIAGNOSIS — Z125 Encounter for screening for malignant neoplasm of prostate: Secondary | ICD-10-CM | POA: Diagnosis not present

## 2013-05-27 DIAGNOSIS — M899 Disorder of bone, unspecified: Secondary | ICD-10-CM | POA: Diagnosis not present

## 2013-05-27 DIAGNOSIS — E538 Deficiency of other specified B group vitamins: Secondary | ICD-10-CM | POA: Diagnosis not present

## 2013-05-27 DIAGNOSIS — E785 Hyperlipidemia, unspecified: Secondary | ICD-10-CM | POA: Diagnosis not present

## 2013-05-27 DIAGNOSIS — I739 Peripheral vascular disease, unspecified: Secondary | ICD-10-CM | POA: Diagnosis not present

## 2013-05-27 DIAGNOSIS — I4891 Unspecified atrial fibrillation: Secondary | ICD-10-CM | POA: Diagnosis not present

## 2013-06-03 DIAGNOSIS — I4891 Unspecified atrial fibrillation: Secondary | ICD-10-CM | POA: Diagnosis not present

## 2013-06-03 DIAGNOSIS — E538 Deficiency of other specified B group vitamins: Secondary | ICD-10-CM | POA: Diagnosis not present

## 2013-06-03 DIAGNOSIS — Z125 Encounter for screening for malignant neoplasm of prostate: Secondary | ICD-10-CM | POA: Diagnosis not present

## 2013-06-03 DIAGNOSIS — H9190 Unspecified hearing loss, unspecified ear: Secondary | ICD-10-CM | POA: Diagnosis not present

## 2013-06-03 DIAGNOSIS — J301 Allergic rhinitis due to pollen: Secondary | ICD-10-CM | POA: Diagnosis not present

## 2013-06-03 DIAGNOSIS — H8110 Benign paroxysmal vertigo, unspecified ear: Secondary | ICD-10-CM | POA: Diagnosis not present

## 2013-06-03 DIAGNOSIS — Z0000 Encounter for general adult medical examination without abnormal findings: Secondary | ICD-10-CM | POA: Diagnosis not present

## 2013-06-03 DIAGNOSIS — C61 Malignant neoplasm of prostate: Secondary | ICD-10-CM | POA: Diagnosis not present

## 2013-06-03 DIAGNOSIS — Z23 Encounter for immunization: Secondary | ICD-10-CM | POA: Diagnosis not present

## 2013-06-03 DIAGNOSIS — Z1331 Encounter for screening for depression: Secondary | ICD-10-CM | POA: Diagnosis not present

## 2013-06-03 DIAGNOSIS — I4949 Other premature depolarization: Secondary | ICD-10-CM | POA: Diagnosis not present

## 2013-06-03 DIAGNOSIS — N390 Urinary tract infection, site not specified: Secondary | ICD-10-CM | POA: Diagnosis not present

## 2013-06-04 ENCOUNTER — Encounter: Payer: Self-pay | Admitting: Cardiology

## 2013-06-04 ENCOUNTER — Ambulatory Visit (INDEPENDENT_AMBULATORY_CARE_PROVIDER_SITE_OTHER): Payer: Medicare Other | Admitting: Cardiology

## 2013-06-04 VITALS — BP 113/67 | HR 86 | Ht 71.5 in | Wt 186.0 lb

## 2013-06-04 DIAGNOSIS — I2510 Atherosclerotic heart disease of native coronary artery without angina pectoris: Secondary | ICD-10-CM | POA: Diagnosis not present

## 2013-06-04 DIAGNOSIS — E785 Hyperlipidemia, unspecified: Secondary | ICD-10-CM | POA: Diagnosis not present

## 2013-06-04 DIAGNOSIS — I359 Nonrheumatic aortic valve disorder, unspecified: Secondary | ICD-10-CM | POA: Diagnosis not present

## 2013-06-04 NOTE — Patient Instructions (Signed)
Consider stopping niacin. You can discuss this with Dr. Waynard Edwards.  Continue your other therapy  I will see you in 6 months.

## 2013-06-04 NOTE — Progress Notes (Signed)
Deeann Saint Sales Date of Birth: 02/03/1926   History of Present Illness: [name] is seen today for a followup visit. He is status post aortic valve replacement and coronary bypass surgery in October of 2011. He has a tissue valve prosthesis. He reports that he is doing very well. He remains active and goes to the [facility] 3 days a week. He also does yard work. He denies any symptoms of chest pain, palpitations, or shortness of breath. He had his complete physical yesterday reports that his total cholesterol was 104.  Current Outpatient Prescriptions on File Prior to Visit  Medication Sig Dispense Refill  . Acetaminophen (TYLENOL PO) Take by mouth as needed.        [name] aspirin 81 MG tablet Take 81 mg by mouth daily.        . brinzolamide (AZOPT) 1 % ophthalmic suspension 1 drop daily. 1 drop both eyes daily      . calcium carbonate 200 MG capsule Take by mouth daily.       . CRESTOR 5 MG tablet TAKE 1 TABLET BY MOUTH ONCE DAILY  30 tablet  1  . docusate sodium (COLACE) 100 MG capsule Take 100 mg by mouth daily.        . fluticasone (FLONASE) 50 MCG/ACT nasal spray 1 spray by Nasal route daily.        [name] GLUCOSAMINE PO Take by mouth 2 (two) times daily.        . metoprolol tartrate (LOPRESSOR) 25 MG tablet Take 25 mg by mouth daily.      . Multiple Vitamin (MULTIVITAMIN) tablet Take 1 tablet by mouth daily.        . Multiple Vitamins-Minerals (ICAPS PO) Take by mouth 2 (two) times daily.        [name] NIACIN, ANTIHYPERLIPIDEMIC, PO Take by mouth at bedtime. 2 TABLETS HS       . Omega-3-acid Ethyl Esters (LOVAZA PO) Take by mouth daily.       [name] perphenazine-amitriptyline 4-25 MG TABS Take 1 tablet by mouth at bedtime.        [name] RANITIDINE HCL PO Take by mouth daily.        . Tafluprost (ZIOPTAN) 0.0015 % SOLN Apply to eye.      [name] VITAMIN D PO Take by mouth daily.         No current facility-administered medications on file prior to visit.    No Known Allergies  Past Medical History   Diagnosis Date  . CAD (coronary artery disease)   . Aortic stenosis   . Atrial fibrillation     POST OP  . Dyslipidemia   . Depression   . Hx of CABG   . Aortic valve prosthesis present     Past Surgical History  Procedure Laterality Date  . Coronary artery bypass graft  October 2011    LIMA to LAD  . Aortic valve replacement  October 2011    Magna Ease pericardial tissue valve #53mm  . Cataract extraction    . Corneal transplant    . Back surgery    . Tonsillectomy      History  Smoking status  . Former Smoker -- 1.00 packs/day for 40 years  . Types: Cigarettes  . Quit date: 11/16/1990  Smokeless tobacco  . Never Used    History  Alcohol Use  . 1.2 oz/week  . 2 Glasses of wine per week    Family History  Problem Relation Age  of Onset  . Heart attack Father     Review of Systems: As noted in history of present illness. All other systems were reviewed and are negative.  Physical Exam: BP 113/67  Pulse 86  Ht 5' 11.5" (1.816 m)  Wt 186 lb (84.369 kg)  BMI 25.58 kg/m2 Patient is very pleasant and in no acute distress. HEENT is unremarkable. Normocephalic/atraumatic. PERRL. Sclera are nonicteric. Neck is supple. No masses. No JVD. Lungs are clear. Cardiac exam shows a regularly irregular rate and rhythm. Frequent extrasystole.  He has a soft 1-2/6 aortic outflow murmur.   Abdomen is soft. Extremities are without edema. Gait and ROM are intact. No gross neurologic deficits noted.  LABORATORY DATA:   ECG today demonstrates normal sinus rhythm with PACs and PVCs. There is evidence of old anteroseptal infarct. This is unchanged from one year ago.  Assessment / Plan: 1. Status post tissue aortic valve replacement for severe aortic stenosis. Clinically doing well. I'll followup again in 6 months.  2. Coronary disease status post CABG with an LIMA graft to the LAD in October of 2011. Currently no anginal symptoms.  3. Dyslipidemia well controlled on Crestor,  niacin, and fish oil. We discussed the recent data concerning niacin. Currently, I cannot recommend niacin as add-on therapy with a statin given the lack of benefit shown in 2 large trials. I recommended that he stop taking niacin given the lack of outcome benefit. He is planning to discuss this with Dr. Waynard Edwards.  4. PVCs. These are chronic. He is on low-dose metoprolol. Higher doses of metoprolol resulted in significant fatigue. We will continue to monitor for now.

## 2013-06-05 DIAGNOSIS — Z1212 Encounter for screening for malignant neoplasm of rectum: Secondary | ICD-10-CM | POA: Diagnosis not present

## 2013-06-06 DIAGNOSIS — H4011X0 Primary open-angle glaucoma, stage unspecified: Secondary | ICD-10-CM | POA: Diagnosis not present

## 2013-06-06 DIAGNOSIS — H353190 Nonexudative age-related macular degeneration, unspecified eye, stage unspecified: Secondary | ICD-10-CM | POA: Diagnosis not present

## 2013-06-06 DIAGNOSIS — H409 Unspecified glaucoma: Secondary | ICD-10-CM | POA: Diagnosis not present

## 2013-07-16 DIAGNOSIS — M899 Disorder of bone, unspecified: Secondary | ICD-10-CM | POA: Diagnosis not present

## 2013-07-31 DIAGNOSIS — H3570 Unspecified separation of retinal layers: Secondary | ICD-10-CM | POA: Diagnosis not present

## 2013-07-31 DIAGNOSIS — H353190 Nonexudative age-related macular degeneration, unspecified eye, stage unspecified: Secondary | ICD-10-CM | POA: Diagnosis not present

## 2013-07-31 DIAGNOSIS — H353290 Exudative age-related macular degeneration, unspecified eye, stage unspecified: Secondary | ICD-10-CM | POA: Diagnosis not present

## 2013-07-31 DIAGNOSIS — H35369 Drusen (degenerative) of macula, unspecified eye: Secondary | ICD-10-CM | POA: Diagnosis not present

## 2013-07-31 DIAGNOSIS — H35359 Cystoid macular degeneration, unspecified eye: Secondary | ICD-10-CM | POA: Diagnosis not present

## 2013-09-17 ENCOUNTER — Encounter: Payer: Self-pay | Admitting: Gastroenterology

## 2013-09-24 ENCOUNTER — Other Ambulatory Visit: Payer: Self-pay | Admitting: Cardiology

## 2013-10-06 DIAGNOSIS — H409 Unspecified glaucoma: Secondary | ICD-10-CM | POA: Diagnosis not present

## 2013-10-06 DIAGNOSIS — H4011X0 Primary open-angle glaucoma, stage unspecified: Secondary | ICD-10-CM | POA: Diagnosis not present

## 2013-10-16 DIAGNOSIS — C61 Malignant neoplasm of prostate: Secondary | ICD-10-CM | POA: Diagnosis not present

## 2013-10-28 ENCOUNTER — Other Ambulatory Visit (HOSPITAL_COMMUNITY): Payer: Self-pay | Admitting: Urology

## 2013-10-28 DIAGNOSIS — C61 Malignant neoplasm of prostate: Secondary | ICD-10-CM

## 2013-11-10 ENCOUNTER — Other Ambulatory Visit (HOSPITAL_COMMUNITY): Payer: Self-pay | Admitting: Urology

## 2013-11-10 ENCOUNTER — Encounter (HOSPITAL_COMMUNITY)
Admission: RE | Admit: 2013-11-10 | Discharge: 2013-11-10 | Disposition: A | Discharge status: Home / Self Care | Payer: Medicare Other | Source: Ambulatory Visit | Attending: Urology | Admitting: Urology

## 2013-11-10 ENCOUNTER — Ambulatory Visit (HOSPITAL_COMMUNITY)
Admission: RE | Admit: 2013-11-10 | Discharge: 2013-11-10 | Disposition: A | Discharge status: Home / Self Care | Payer: Medicare Other | Source: Ambulatory Visit | Attending: Urology | Admitting: Urology

## 2013-11-10 DIAGNOSIS — C61 Malignant neoplasm of prostate: Secondary | ICD-10-CM | POA: Insufficient documentation

## 2013-11-10 DIAGNOSIS — R948 Abnormal results of function studies of other organs and systems: Secondary | ICD-10-CM | POA: Diagnosis not present

## 2013-11-10 DIAGNOSIS — R972 Elevated prostate specific antigen [PSA]: Secondary | ICD-10-CM | POA: Insufficient documentation

## 2013-11-10 DIAGNOSIS — R52 Pain, unspecified: Secondary | ICD-10-CM

## 2013-11-10 MED ORDER — TECHNETIUM TC 99M MEDRONATE IV KIT
25.0000 | PACK | Freq: Once | INTRAVENOUS | Status: AC | PRN
  Administered 2013-11-10: 25 via INTRAVENOUS

## 2013-12-10 DIAGNOSIS — R7301 Impaired fasting glucose: Secondary | ICD-10-CM | POA: Diagnosis not present

## 2013-12-10 DIAGNOSIS — I4891 Unspecified atrial fibrillation: Secondary | ICD-10-CM | POA: Diagnosis not present

## 2013-12-10 DIAGNOSIS — F329 Major depressive disorder, single episode, unspecified: Secondary | ICD-10-CM | POA: Diagnosis not present

## 2013-12-10 DIAGNOSIS — Z6827 Body mass index (BMI) 27.0-27.9, adult: Secondary | ICD-10-CM | POA: Diagnosis not present

## 2013-12-10 DIAGNOSIS — G4733 Obstructive sleep apnea (adult) (pediatric): Secondary | ICD-10-CM | POA: Diagnosis not present

## 2013-12-10 DIAGNOSIS — F3289 Other specified depressive episodes: Secondary | ICD-10-CM | POA: Diagnosis not present

## 2013-12-18 ENCOUNTER — Other Ambulatory Visit: Payer: Self-pay | Admitting: Cardiology

## 2014-01-06 DIAGNOSIS — H4011X0 Primary open-angle glaucoma, stage unspecified: Secondary | ICD-10-CM | POA: Diagnosis not present

## 2014-01-06 DIAGNOSIS — H409 Unspecified glaucoma: Secondary | ICD-10-CM | POA: Diagnosis not present

## 2014-01-12 ENCOUNTER — Ambulatory Visit (INDEPENDENT_AMBULATORY_CARE_PROVIDER_SITE_OTHER): Payer: Medicare Other | Admitting: Cardiology

## 2014-01-12 ENCOUNTER — Encounter: Payer: Self-pay | Admitting: Cardiology

## 2014-01-12 VITALS — BP 125/69 | HR 90 | Ht 71.5 in | Wt 190.0 lb

## 2014-01-12 DIAGNOSIS — I2510 Atherosclerotic heart disease of native coronary artery without angina pectoris: Secondary | ICD-10-CM | POA: Diagnosis not present

## 2014-01-12 DIAGNOSIS — E785 Hyperlipidemia, unspecified: Secondary | ICD-10-CM

## 2014-01-12 DIAGNOSIS — I359 Nonrheumatic aortic valve disorder, unspecified: Secondary | ICD-10-CM

## 2014-01-12 NOTE — Patient Instructions (Signed)
Continue your current therapy  I will see you in 6 months.   

## 2014-01-12 NOTE — Progress Notes (Signed)
[place] Date of Birth: [birth year]   History of Present Illness: Mr. Lento is seen today for a followup visit. He is status post aortic valve replacement and coronary bypass surgery in October of 2011. He has a tissue valve prosthesis. He reports that he is doing very well. He remains active and goes to the [facility] 3 days a week. He also does yard work. He denies any symptoms of chest pain, palpitations, or shortness of breath. He does have a history of prostate CA that is being managed conservatively. Last PSA was elevated to 14.   Current Outpatient Prescriptions on File Prior to Visit  Medication Sig Dispense Refill  . Acetaminophen (TYLENOL PO) Take by mouth as needed.        [name] aspirin 81 MG tablet Take 81 mg by mouth daily.        . brinzolamide (AZOPT) 1 % ophthalmic suspension 1 drop daily. 1 drop both eyes daily      . calcium carbonate 200 MG capsule Take by mouth daily.       . CRESTOR 5 MG tablet TAKE 1 TABLET BY MOUTH ONCE DAILY  30 tablet  1  . docusate sodium (COLACE) 100 MG capsule Take 100 mg by mouth daily.        . fluticasone (FLONASE) 50 MCG/ACT nasal spray 1 spray by Nasal route daily.        [name] GLUCOSAMINE PO Take by mouth 2 (two) times daily.        . metoprolol tartrate (LOPRESSOR) 25 MG tablet TAKE 1 TABLET (25 MG TOTAL) BY MOUTH DAILY.  90 tablet  0  . Multiple Vitamin (MULTIVITAMIN) tablet Take 1 tablet by mouth daily.        . Multiple Vitamins-Minerals (ICAPS PO) Take by mouth 2 (two) times daily.        . Omega-3-acid Ethyl Esters (LOVAZA PO) Take by mouth daily.       [name] perphenazine-amitriptyline 4-25 MG TABS Take 1 tablet by mouth at bedtime.        [name] RANITIDINE HCL PO Take by mouth daily.        . Tafluprost (ZIOPTAN) 0.0015 % SOLN Apply to eye.      [name] VITAMIN D PO Take by mouth daily.         No current facility-administered medications on file prior to visit.    No Known Allergies  Past Medical History  Diagnosis Date  . CAD (coronary  artery disease)   . Aortic stenosis   . Atrial fibrillation     POST OP  . Dyslipidemia   . Depression   . Hx of CABG   . Aortic valve prosthesis present   . Prostate ca     Past Surgical History  Procedure Laterality Date  . Coronary artery bypass graft  October 2011    LIMA to LAD  . Aortic valve replacement  October 2011    Magna Ease pericardial tissue valve #40mm  . Cataract extraction    . Corneal transplant    . Back surgery    . Tonsillectomy      History  Smoking status  . Former Smoker -- 1.00 packs/day for 40 years  . Types: Cigarettes  . Quit date: 11/16/1990  Smokeless tobacco  . Never Used    History  Alcohol Use  . 1.2 oz/week  . 2 Glasses of wine per week    Family History  Problem Relation Age of Onset  .  Heart attack Father     Review of Systems: As noted in history of present illness. All other systems were reviewed and are negative.  Physical Exam: BP 125/69  Pulse 90  Ht 5' 11.5" (1.816 m)  Wt 190 lb (86.183 kg)  BMI 26.13 kg/m2 Patient is very pleasant and in no acute distress. HEENT is unremarkable. Normocephalic/atraumatic. PERRL. Sclera are nonicteric. Neck is supple. No masses. No JVD. Lungs are clear. Cardiac exam shows a regularly irregular rate and rhythm. Some extrasystoles.  He has a soft 1-2/6 aortic outflow murmur.   Abdomen is soft. Extremities are without edema. Gait and ROM are intact. No gross neurologic deficits noted.  LABORATORY DATA:     Assessment / Plan: 1. Status post tissue aortic valve replacement for severe aortic stenosis. Clinically doing well. I'll followup again in 6 months.  2. Coronary disease status post CABG with an LIMA graft to the LAD in October of 2011. Currently no anginal symptoms.  3. Dyslipidemia well controlled on Crestor and fish oil.   4. PVCs. These are chronic. He is on low-dose metoprolol. Higher doses of metoprolol resulted in significant fatigue. We will continue to monitor for  now.

## 2014-01-30 DIAGNOSIS — H4011X0 Primary open-angle glaucoma, stage unspecified: Secondary | ICD-10-CM | POA: Diagnosis not present

## 2014-01-30 DIAGNOSIS — H409 Unspecified glaucoma: Secondary | ICD-10-CM | POA: Diagnosis not present

## 2014-02-05 DIAGNOSIS — H3570 Unspecified separation of retinal layers: Secondary | ICD-10-CM | POA: Diagnosis not present

## 2014-02-05 DIAGNOSIS — H35369 Drusen (degenerative) of macula, unspecified eye: Secondary | ICD-10-CM | POA: Diagnosis not present

## 2014-02-05 DIAGNOSIS — H353290 Exudative age-related macular degeneration, unspecified eye, stage unspecified: Secondary | ICD-10-CM | POA: Diagnosis not present

## 2014-02-05 DIAGNOSIS — H35059 Retinal neovascularization, unspecified, unspecified eye: Secondary | ICD-10-CM | POA: Diagnosis not present

## 2014-02-05 DIAGNOSIS — H35359 Cystoid macular degeneration, unspecified eye: Secondary | ICD-10-CM | POA: Diagnosis not present

## 2014-02-11 DIAGNOSIS — H903 Sensorineural hearing loss, bilateral: Secondary | ICD-10-CM | POA: Diagnosis not present

## 2014-02-20 DIAGNOSIS — T85398A Other mechanical complication of other ocular prosthetic devices, implants and grafts, initial encounter: Secondary | ICD-10-CM | POA: Diagnosis not present

## 2014-02-20 DIAGNOSIS — H538 Other visual disturbances: Secondary | ICD-10-CM | POA: Diagnosis not present

## 2014-02-20 DIAGNOSIS — T85898A Other specified complication of other internal prosthetic devices, implants and grafts, initial encounter: Secondary | ICD-10-CM | POA: Diagnosis not present

## 2014-02-20 DIAGNOSIS — H18239 Secondary corneal edema, unspecified eye: Secondary | ICD-10-CM | POA: Diagnosis not present

## 2014-02-20 DIAGNOSIS — H353190 Nonexudative age-related macular degeneration, unspecified eye, stage unspecified: Secondary | ICD-10-CM | POA: Diagnosis not present

## 2014-02-23 DIAGNOSIS — H18239 Secondary corneal edema, unspecified eye: Secondary | ICD-10-CM | POA: Diagnosis not present

## 2014-02-23 DIAGNOSIS — Z48298 Encounter for aftercare following other organ transplant: Secondary | ICD-10-CM | POA: Diagnosis not present

## 2014-02-23 DIAGNOSIS — Z947 Corneal transplant status: Secondary | ICD-10-CM | POA: Diagnosis not present

## 2014-02-23 DIAGNOSIS — Z961 Presence of intraocular lens: Secondary | ICD-10-CM | POA: Diagnosis not present

## 2014-03-02 DIAGNOSIS — H3530 Unspecified macular degeneration: Secondary | ICD-10-CM | POA: Diagnosis not present

## 2014-03-02 DIAGNOSIS — T85398A Other mechanical complication of other ocular prosthetic devices, implants and grafts, initial encounter: Secondary | ICD-10-CM | POA: Diagnosis not present

## 2014-03-02 DIAGNOSIS — H409 Unspecified glaucoma: Secondary | ICD-10-CM | POA: Diagnosis not present

## 2014-03-02 DIAGNOSIS — Z961 Presence of intraocular lens: Secondary | ICD-10-CM | POA: Diagnosis not present

## 2014-03-02 DIAGNOSIS — H4011X0 Primary open-angle glaucoma, stage unspecified: Secondary | ICD-10-CM | POA: Diagnosis not present

## 2014-03-17 ENCOUNTER — Other Ambulatory Visit: Payer: Self-pay | Admitting: Cardiology

## 2014-04-06 DIAGNOSIS — H4011X0 Primary open-angle glaucoma, stage unspecified: Secondary | ICD-10-CM | POA: Diagnosis not present

## 2014-04-06 DIAGNOSIS — T85398A Other mechanical complication of other ocular prosthetic devices, implants and grafts, initial encounter: Secondary | ICD-10-CM | POA: Diagnosis not present

## 2014-04-06 DIAGNOSIS — H409 Unspecified glaucoma: Secondary | ICD-10-CM | POA: Diagnosis not present

## 2014-04-06 DIAGNOSIS — Z947 Corneal transplant status: Secondary | ICD-10-CM | POA: Diagnosis not present

## 2014-04-23 DIAGNOSIS — C61 Malignant neoplasm of prostate: Secondary | ICD-10-CM | POA: Diagnosis not present

## 2014-04-30 DIAGNOSIS — C61 Malignant neoplasm of prostate: Secondary | ICD-10-CM | POA: Diagnosis not present

## 2014-05-21 DIAGNOSIS — Z5189 Encounter for other specified aftercare: Secondary | ICD-10-CM | POA: Diagnosis not present

## 2014-05-21 DIAGNOSIS — Z947 Corneal transplant status: Secondary | ICD-10-CM | POA: Diagnosis not present

## 2014-05-21 DIAGNOSIS — T85398A Other mechanical complication of other ocular prosthetic devices, implants and grafts, initial encounter: Secondary | ICD-10-CM | POA: Diagnosis not present

## 2014-05-21 DIAGNOSIS — H3530 Unspecified macular degeneration: Secondary | ICD-10-CM | POA: Diagnosis not present

## 2014-05-25 DIAGNOSIS — Z5189 Encounter for other specified aftercare: Secondary | ICD-10-CM | POA: Diagnosis not present

## 2014-05-25 DIAGNOSIS — Z961 Presence of intraocular lens: Secondary | ICD-10-CM | POA: Diagnosis not present

## 2014-05-25 DIAGNOSIS — H3530 Unspecified macular degeneration: Secondary | ICD-10-CM | POA: Diagnosis not present

## 2014-05-25 DIAGNOSIS — H409 Unspecified glaucoma: Secondary | ICD-10-CM | POA: Diagnosis not present

## 2014-05-25 DIAGNOSIS — T85398A Other mechanical complication of other ocular prosthetic devices, implants and grafts, initial encounter: Secondary | ICD-10-CM | POA: Diagnosis not present

## 2014-05-25 DIAGNOSIS — H4011X0 Primary open-angle glaucoma, stage unspecified: Secondary | ICD-10-CM | POA: Diagnosis not present

## 2014-05-26 DIAGNOSIS — Z111 Encounter for screening for respiratory tuberculosis: Secondary | ICD-10-CM | POA: Diagnosis not present

## 2014-05-26 DIAGNOSIS — Z23 Encounter for immunization: Secondary | ICD-10-CM | POA: Diagnosis not present

## 2014-06-01 DIAGNOSIS — H4011X2 Primary open-angle glaucoma, moderate stage: Secondary | ICD-10-CM | POA: Diagnosis not present

## 2014-06-01 DIAGNOSIS — T86840 Corneal transplant rejection: Secondary | ICD-10-CM | POA: Diagnosis not present

## 2014-06-01 DIAGNOSIS — H4011X3 Primary open-angle glaucoma, severe stage: Secondary | ICD-10-CM | POA: Diagnosis not present

## 2014-06-24 DIAGNOSIS — M8580 Other specified disorders of bone density and structure, unspecified site: Secondary | ICD-10-CM | POA: Diagnosis not present

## 2014-06-24 DIAGNOSIS — R7301 Impaired fasting glucose: Secondary | ICD-10-CM | POA: Diagnosis not present

## 2014-06-24 DIAGNOSIS — Z125 Encounter for screening for malignant neoplasm of prostate: Secondary | ICD-10-CM | POA: Diagnosis not present

## 2014-06-24 DIAGNOSIS — E538 Deficiency of other specified B group vitamins: Secondary | ICD-10-CM | POA: Diagnosis not present

## 2014-06-24 DIAGNOSIS — M859 Disorder of bone density and structure, unspecified: Secondary | ICD-10-CM | POA: Diagnosis not present

## 2014-06-24 DIAGNOSIS — Z008 Encounter for other general examination: Secondary | ICD-10-CM | POA: Diagnosis not present

## 2014-06-24 DIAGNOSIS — E785 Hyperlipidemia, unspecified: Secondary | ICD-10-CM | POA: Diagnosis not present

## 2014-06-25 DIAGNOSIS — H3532 Exudative age-related macular degeneration: Secondary | ICD-10-CM | POA: Diagnosis not present

## 2014-06-25 DIAGNOSIS — H35363 Drusen (degenerative) of macula, bilateral: Secondary | ICD-10-CM | POA: Diagnosis not present

## 2014-07-01 DIAGNOSIS — K5900 Constipation, unspecified: Secondary | ICD-10-CM | POA: Diagnosis not present

## 2014-07-01 DIAGNOSIS — T85398D Other mechanical complication of other ocular prosthetic devices, implants and grafts, subsequent encounter: Secondary | ICD-10-CM | POA: Diagnosis not present

## 2014-07-01 DIAGNOSIS — Z961 Presence of intraocular lens: Secondary | ICD-10-CM | POA: Diagnosis not present

## 2014-07-01 DIAGNOSIS — Z008 Encounter for other general examination: Secondary | ICD-10-CM | POA: Diagnosis not present

## 2014-07-01 DIAGNOSIS — H4011X0 Primary open-angle glaucoma, stage unspecified: Secondary | ICD-10-CM | POA: Diagnosis not present

## 2014-07-01 DIAGNOSIS — Z1389 Encounter for screening for other disorder: Secondary | ICD-10-CM | POA: Diagnosis not present

## 2014-07-01 DIAGNOSIS — M5380 Other specified dorsopathies, site unspecified: Secondary | ICD-10-CM | POA: Diagnosis not present

## 2014-07-01 DIAGNOSIS — H3530 Unspecified macular degeneration: Secondary | ICD-10-CM | POA: Diagnosis not present

## 2014-07-01 DIAGNOSIS — E538 Deficiency of other specified B group vitamins: Secondary | ICD-10-CM | POA: Diagnosis not present

## 2014-07-01 DIAGNOSIS — Z86010 Personal history of colonic polyps: Secondary | ICD-10-CM | POA: Diagnosis not present

## 2014-07-01 DIAGNOSIS — C61 Malignant neoplasm of prostate: Secondary | ICD-10-CM | POA: Diagnosis not present

## 2014-07-01 DIAGNOSIS — H9190 Unspecified hearing loss, unspecified ear: Secondary | ICD-10-CM | POA: Diagnosis not present

## 2014-07-01 DIAGNOSIS — I493 Ventricular premature depolarization: Secondary | ICD-10-CM | POA: Diagnosis not present

## 2014-07-09 DIAGNOSIS — Z1212 Encounter for screening for malignant neoplasm of rectum: Secondary | ICD-10-CM | POA: Diagnosis not present

## 2014-07-15 ENCOUNTER — Ambulatory Visit (INDEPENDENT_AMBULATORY_CARE_PROVIDER_SITE_OTHER): Payer: Medicare Other | Admitting: Cardiology

## 2014-07-15 ENCOUNTER — Encounter: Payer: Self-pay | Admitting: Cardiology

## 2014-07-15 VITALS — BP 96/68 | HR 94 | Ht 71.0 in | Wt 181.6 lb

## 2014-07-15 DIAGNOSIS — I359 Nonrheumatic aortic valve disorder, unspecified: Secondary | ICD-10-CM

## 2014-07-15 DIAGNOSIS — I4891 Unspecified atrial fibrillation: Secondary | ICD-10-CM | POA: Insufficient documentation

## 2014-07-15 DIAGNOSIS — I481 Persistent atrial fibrillation: Secondary | ICD-10-CM | POA: Diagnosis not present

## 2014-07-15 DIAGNOSIS — I2510 Atherosclerotic heart disease of native coronary artery without angina pectoris: Secondary | ICD-10-CM | POA: Diagnosis not present

## 2014-07-15 DIAGNOSIS — I4819 Other persistent atrial fibrillation: Secondary | ICD-10-CM

## 2014-07-15 DIAGNOSIS — I25810 Atherosclerosis of coronary artery bypass graft(s) without angina pectoris: Secondary | ICD-10-CM | POA: Diagnosis not present

## 2014-07-15 DIAGNOSIS — I482 Chronic atrial fibrillation, unspecified: Secondary | ICD-10-CM | POA: Insufficient documentation

## 2014-07-15 DIAGNOSIS — I493 Ventricular premature depolarization: Secondary | ICD-10-CM

## 2014-07-15 MED ORDER — RIVAROXABAN 20 MG PO TABS: 20.0000 mg | ORAL_TABLET | Freq: Every day | ORAL | Status: DC

## 2014-07-15 NOTE — Progress Notes (Signed)
[place] Date of Birth: [birth year]   History of Present Illness: Jason [name] is seen today for follow up of AV disease and CAD. He is status post aortic valve replacement and coronary bypass surgery in October of 2011. He has a tissue valve prosthesis. He reports that he is doing very well. He remains active and exercises 3 days a week. He also does yard work. He denies any symptoms of chest pain, palpitations, or shortness of breath. He does have a history of prostate CA that is being managed conservatively. Denies any palpitations. He is under increased stress with planned move to [facility] in January. He has lost 9 lbs. Recent lab work done by Dr. Joylene Draft. Scheduled for carotid dopplers in 2 weeks.   Current Outpatient Prescriptions on File Prior to Visit  Medication Sig Dispense Refill  . Acetaminophen (TYLENOL PO) Take by mouth as needed.      . brinzolamide (AZOPT) 1 % ophthalmic suspension Place 1 drop into both eyes 2 (two) times daily.     . calcium carbonate 200 MG capsule Take by mouth daily.     [name] docusate sodium (COLACE) 100 MG capsule Take 100 mg by mouth daily.      . fluticasone (FLONASE) 50 MCG/ACT nasal spray Place 1 spray into the nose daily as needed.     [name] GLUCOSAMINE PO Take 1 tablet by mouth 2 (two) times daily.     . metoprolol tartrate (LOPRESSOR) 25 MG tablet TAKE 1 TABLET BY MOUTH EVERY DAY 90 tablet 3  . Multiple Vitamin (MULTIVITAMIN) tablet Take 1 tablet by mouth daily.      . Multiple Vitamins-Minerals (ICAPS PO) Take 1 capsule by mouth 2 (two) times daily.     . Omega-3-acid Ethyl Esters (LOVAZA PO) Take 1 capsule by mouth daily.     [name] perphenazine-amitriptyline 4-25 MG TABS Take 1 tablet by mouth at bedtime.      [name] RANITIDINE HCL PO Take by mouth daily.      [name] VITAMIN D PO Take by mouth daily.       No current facility-administered medications on file prior to visit.    No Known Allergies  Past Medical History  Diagnosis Date  . CAD  (coronary artery disease)   . Aortic stenosis   . Atrial fibrillation   . Dyslipidemia   . Depression   . Hx of CABG   . Aortic valve prosthesis present   . Prostate ca     Past Surgical History  Procedure Laterality Date  . Coronary artery bypass graft  October 2011    LIMA to LAD  . Aortic valve replacement  October 2011    Magna Ease pericardial tissue valve #99mm  . Cataract extraction    . Corneal transplant    . Back surgery    . Tonsillectomy      History  Smoking status  . Former Smoker -- 1.00 packs/day for 40 years  . Types: Cigarettes  . Quit date: 11/16/1990  Smokeless tobacco  . Never Used    History  Alcohol Use  . 1.2 oz/week  . 2 Glasses of wine per week    Family History  Problem Relation Age of Onset  . Heart attack Father     Review of Systems: As noted in history of present illness. Not history of TIA or CVA. No bleeding problems. He did have Afib following open heart surgery that resolved. All other systems were reviewed  and are negative.  Physical Exam: BP 96/68 mmHg  Pulse 94  Ht 5\' 11"  (1.803 m)  Wt 181 lb 9.6 oz (82.373 kg)  BMI 25.34 kg/m2 Patient is very pleasant and in no acute distress. HEENT is unremarkable. Normocephalic/atraumatic. PERRL. Sclera are nonicteric. Neck is supple. No masses. No JVD. Lungs are clear. Cardiac exam shows an irregularly irregular rate and rhythm.  He has a soft 1-2/6 aortic outflow murmur.   Abdomen is soft. Extremities are without edema. Gait and ROM are intact. No gross neurologic deficits noted.  LABORATORY DATA:   Ecg today shows atrial fibrillation with rate 94 bpm. LAD, old septal infarct.   Assessment / Plan: 1. Status post tissue aortic valve replacement for severe aortic stenosis. Clinically doing well. Will update Echo.  2. Coronary disease status post CABG with an LIMA graft to the LAD in October of 2011. Currently no anginal symptoms.  3. Dyslipidemia well controlled on Crestor and fish  oil.   4. PVCs. These are chronic.  5. Atrial fibrillation. New onset. Currently asymptomatic. Rate controlled on metoprolol. Will check Echo. Get results of recent labs. Recommend long term anticoagulation to reduce risk of CVA. [place] vasc score of 3. Will start Xarelto 20 mg daily.

## 2014-07-15 NOTE — Patient Instructions (Addendum)
We will schedule you for an echocardiogram and get the results of your blood work from Dr. Joylene Draft  We will start you on Xarelto 20 mg daily   Stop ASA  I will see you in 2 months.

## 2014-07-17 ENCOUNTER — Ambulatory Visit (HOSPITAL_COMMUNITY)
Admission: RE | Admit: 2014-07-17 | Discharge: 2014-07-17 | Disposition: A | Discharge status: Home / Self Care | Payer: Medicare Other | Source: Ambulatory Visit | Attending: Cardiology | Admitting: Cardiology

## 2014-07-17 DIAGNOSIS — E785 Hyperlipidemia, unspecified: Secondary | ICD-10-CM | POA: Insufficient documentation

## 2014-07-17 DIAGNOSIS — I059 Rheumatic mitral valve disease, unspecified: Secondary | ICD-10-CM | POA: Diagnosis not present

## 2014-07-17 DIAGNOSIS — Z87891 Personal history of nicotine dependence: Secondary | ICD-10-CM | POA: Diagnosis not present

## 2014-07-17 DIAGNOSIS — I359 Nonrheumatic aortic valve disorder, unspecified: Secondary | ICD-10-CM

## 2014-07-17 DIAGNOSIS — I481 Persistent atrial fibrillation: Secondary | ICD-10-CM | POA: Diagnosis not present

## 2014-07-17 DIAGNOSIS — I25810 Atherosclerosis of coronary artery bypass graft(s) without angina pectoris: Secondary | ICD-10-CM

## 2014-07-17 DIAGNOSIS — I4819 Other persistent atrial fibrillation: Secondary | ICD-10-CM

## 2014-07-17 DIAGNOSIS — I493 Ventricular premature depolarization: Secondary | ICD-10-CM

## 2014-07-17 NOTE — Progress Notes (Signed)
2D Echocardiogram Complete.  07/17/2014   Jason [name], [place]

## 2014-07-22 DIAGNOSIS — H3530 Unspecified macular degeneration: Secondary | ICD-10-CM | POA: Diagnosis not present

## 2014-07-22 DIAGNOSIS — H4011X4 Primary open-angle glaucoma, indeterminate stage: Secondary | ICD-10-CM | POA: Diagnosis not present

## 2014-07-30 DIAGNOSIS — I739 Peripheral vascular disease, unspecified: Secondary | ICD-10-CM | POA: Diagnosis not present

## 2014-08-05 DIAGNOSIS — T85398D Other mechanical complication of other ocular prosthetic devices, implants and grafts, subsequent encounter: Secondary | ICD-10-CM | POA: Diagnosis not present

## 2014-08-05 DIAGNOSIS — Z961 Presence of intraocular lens: Secondary | ICD-10-CM | POA: Diagnosis not present

## 2014-08-05 DIAGNOSIS — H4011X4 Primary open-angle glaucoma, indeterminate stage: Secondary | ICD-10-CM | POA: Diagnosis not present

## 2014-08-05 DIAGNOSIS — H3530 Unspecified macular degeneration: Secondary | ICD-10-CM | POA: Diagnosis not present

## 2014-08-14 DIAGNOSIS — H4011X2 Primary open-angle glaucoma, moderate stage: Secondary | ICD-10-CM | POA: Diagnosis not present

## 2014-08-18 DIAGNOSIS — H35363 Drusen (degenerative) of macula, bilateral: Secondary | ICD-10-CM | POA: Diagnosis not present

## 2014-08-18 DIAGNOSIS — H3531 Nonexudative age-related macular degeneration: Secondary | ICD-10-CM | POA: Diagnosis not present

## 2014-09-14 ENCOUNTER — Inpatient Hospital Stay (HOSPITAL_COMMUNITY)
Admission: EM | Admit: 2014-09-14 | Discharge: 2014-09-17 | DRG: 378 | Disposition: A | Discharge status: Home / Self Care | Payer: Medicare Other | Attending: Internal Medicine | Admitting: Internal Medicine

## 2014-09-14 ENCOUNTER — Encounter (HOSPITAL_COMMUNITY): Payer: Self-pay | Admitting: Emergency Medicine

## 2014-09-14 DIAGNOSIS — Z7901 Long term (current) use of anticoagulants: Secondary | ICD-10-CM

## 2014-09-14 DIAGNOSIS — I10 Essential (primary) hypertension: Secondary | ICD-10-CM | POA: Diagnosis not present

## 2014-09-14 DIAGNOSIS — K31819 Angiodysplasia of stomach and duodenum without bleeding: Secondary | ICD-10-CM | POA: Insufficient documentation

## 2014-09-14 DIAGNOSIS — I248 Other forms of acute ischemic heart disease: Secondary | ICD-10-CM | POA: Diagnosis present

## 2014-09-14 DIAGNOSIS — G4733 Obstructive sleep apnea (adult) (pediatric): Secondary | ICD-10-CM | POA: Diagnosis present

## 2014-09-14 DIAGNOSIS — K449 Diaphragmatic hernia without obstruction or gangrene: Secondary | ICD-10-CM | POA: Diagnosis present

## 2014-09-14 DIAGNOSIS — I2510 Atherosclerotic heart disease of native coronary artery without angina pectoris: Secondary | ICD-10-CM | POA: Diagnosis present

## 2014-09-14 DIAGNOSIS — E538 Deficiency of other specified B group vitamins: Secondary | ICD-10-CM | POA: Diagnosis not present

## 2014-09-14 DIAGNOSIS — K922 Gastrointestinal hemorrhage, unspecified: Secondary | ICD-10-CM | POA: Diagnosis present

## 2014-09-14 DIAGNOSIS — K921 Melena: Secondary | ICD-10-CM | POA: Diagnosis not present

## 2014-09-14 DIAGNOSIS — R42 Dizziness and giddiness: Secondary | ICD-10-CM | POA: Diagnosis not present

## 2014-09-14 DIAGNOSIS — Z87891 Personal history of nicotine dependence: Secondary | ICD-10-CM

## 2014-09-14 DIAGNOSIS — K621 Rectal polyp: Secondary | ICD-10-CM | POA: Diagnosis present

## 2014-09-14 DIAGNOSIS — F329 Major depressive disorder, single episode, unspecified: Secondary | ICD-10-CM | POA: Diagnosis present

## 2014-09-14 DIAGNOSIS — D62 Acute posthemorrhagic anemia: Secondary | ICD-10-CM | POA: Diagnosis present

## 2014-09-14 DIAGNOSIS — Z6826 Body mass index (BMI) 26.0-26.9, adult: Secondary | ICD-10-CM | POA: Diagnosis not present

## 2014-09-14 DIAGNOSIS — R404 Transient alteration of awareness: Secondary | ICD-10-CM | POA: Diagnosis not present

## 2014-09-14 DIAGNOSIS — E785 Hyperlipidemia, unspecified: Secondary | ICD-10-CM | POA: Diagnosis present

## 2014-09-14 DIAGNOSIS — Z8249 Family history of ischemic heart disease and other diseases of the circulatory system: Secondary | ICD-10-CM

## 2014-09-14 DIAGNOSIS — I4891 Unspecified atrial fibrillation: Secondary | ICD-10-CM | POA: Diagnosis present

## 2014-09-14 DIAGNOSIS — Z0389 Encounter for observation for other suspected diseases and conditions ruled out: Secondary | ICD-10-CM | POA: Diagnosis not present

## 2014-09-14 DIAGNOSIS — Q2733 Arteriovenous malformation of digestive system vessel: Secondary | ICD-10-CM | POA: Diagnosis not present

## 2014-09-14 DIAGNOSIS — Z79899 Other long term (current) drug therapy: Secondary | ICD-10-CM | POA: Diagnosis not present

## 2014-09-14 DIAGNOSIS — K31811 Angiodysplasia of stomach and duodenum with bleeding: Secondary | ICD-10-CM | POA: Diagnosis present

## 2014-09-14 DIAGNOSIS — Z953 Presence of xenogenic heart valve: Secondary | ICD-10-CM

## 2014-09-14 DIAGNOSIS — Z951 Presence of aortocoronary bypass graft: Secondary | ICD-10-CM | POA: Diagnosis not present

## 2014-09-14 DIAGNOSIS — I482 Chronic atrial fibrillation: Secondary | ICD-10-CM | POA: Diagnosis not present

## 2014-09-14 DIAGNOSIS — D649 Anemia, unspecified: Secondary | ICD-10-CM | POA: Diagnosis not present

## 2014-09-14 DIAGNOSIS — I480 Paroxysmal atrial fibrillation: Secondary | ICD-10-CM | POA: Diagnosis not present

## 2014-09-14 HISTORY — DX: Benign neoplasm of colon, unspecified: D12.6

## 2014-09-14 LAB — CBC WITH DIFFERENTIAL/PLATELET
BASOS ABS: 0.1 10*3/uL (ref 0.0–0.1)
Basophils Absolute: 0.1 10*3/uL (ref 0.0–0.1)
Basophils Relative: 1 % (ref 0–1)
Basophils Relative: 1 % (ref 0–1)
EOS ABS: 0.1 10*3/uL (ref 0.0–0.7)
EOS ABS: 0 10*3/uL (ref 0.0–0.7)
EOS PCT: 0 % (ref 0–5)
EOS PCT: 0 % (ref 0–5)
HCT: 32.9 % — ABNORMAL LOW (ref 39.0–52.0)
HEMATOCRIT: 31.9 % — AB (ref 39.0–52.0)
HEMOGLOBIN: 10.4 g/dL — AB (ref 13.0–17.0)
Hemoglobin: 10.8 g/dL — ABNORMAL LOW (ref 13.0–17.0)
LYMPHS ABS: 0.8 10*3/uL (ref 0.7–4.0)
Lymphocytes Relative: 8 % — ABNORMAL LOW (ref 12–46)
Lymphocytes Relative: 13 % (ref 12–46)
Lymphs Abs: 1.4 10*3/uL (ref 0.7–4.0)
MCH: 29.7 pg (ref 26.0–34.0)
MCH: 29.4 pg (ref 26.0–34.0)
MCHC: 32.8 g/dL (ref 30.0–36.0)
MCHC: 32.6 g/dL (ref 30.0–36.0)
MCV: 90.4 fL (ref 78.0–100.0)
MCV: 90.1 fL (ref 78.0–100.0)
MONO ABS: 0.6 10*3/uL (ref 0.1–1.0)
Monocytes Absolute: 0.5 10*3/uL (ref 0.1–1.0)
Monocytes Relative: 6 % (ref 3–12)
Monocytes Relative: 5 % (ref 3–12)
Neutro Abs: 9.5 10*3/uL — ABNORMAL HIGH (ref 1.7–7.7)
Neutro Abs: 8.7 10*3/uL — ABNORMAL HIGH (ref 1.7–7.7)
Neutrophils Relative %: 85 % — ABNORMAL HIGH (ref 43–77)
Neutrophils Relative %: 81 % — ABNORMAL HIGH (ref 43–77)
PLATELETS: 206 10*3/uL (ref 150–400)
Platelets: 215 10*3/uL (ref 150–400)
RBC: 3.64 MIL/uL — AB (ref 4.22–5.81)
RBC: 3.54 MIL/uL — ABNORMAL LOW (ref 4.22–5.81)
RDW: 13.5 % (ref 11.5–15.5)
RDW: 13.3 % (ref 11.5–15.5)
WBC: 11.1 10*3/uL — ABNORMAL HIGH (ref 4.0–10.5)
WBC: 10.7 10*3/uL — ABNORMAL HIGH (ref 4.0–10.5)

## 2014-09-14 LAB — COMPREHENSIVE METABOLIC PANEL
ALBUMIN: 3.9 g/dL (ref 3.5–5.2)
ALT: 15 U/L (ref 0–53)
ANION GAP: 10 (ref 5–15)
AST: 20 U/L (ref 0–37)
Alkaline Phosphatase: 49 U/L (ref 39–117)
BILIRUBIN TOTAL: 0.7 mg/dL (ref 0.3–1.2)
BUN: 9 mg/dL (ref 6–23)
CHLORIDE: 104 meq/L (ref 96–112)
CO2: 26 mmol/L (ref 19–32)
CREATININE: 1.03 mg/dL (ref 0.50–1.35)
Calcium: 8.8 mg/dL (ref 8.4–10.5)
GFR calc Af Amer: 74 mL/min — ABNORMAL LOW (ref >90)
GFR, EST NON AFRICAN AMERICAN: 64 mL/min — AB (ref >90)
Glucose, Bld: 115 mg/dL — ABNORMAL HIGH (ref 70–99)
POTASSIUM: 3.8 mmol/L (ref 3.5–5.1)
SODIUM: 140 mmol/L (ref 135–145)
Total Protein: 7.3 g/dL (ref 6.0–8.3)

## 2014-09-14 LAB — PROTIME-INR
INR: 1.35 (ref 0.00–1.49)
Prothrombin Time: 16.8 seconds — ABNORMAL HIGH (ref 11.6–15.2)

## 2014-09-14 LAB — TYPE AND SCREEN
ABO/RH(D): A NEG
Antibody Screen: NEGATIVE

## 2014-09-14 LAB — TROPONIN I: Troponin I: 0.03 ng/mL (ref <0.031)

## 2014-09-14 MED ORDER — ONDANSETRON HCL 4 MG/2ML IJ SOLN: 4.0000 mg | Freq: Four times a day (QID) | INTRAMUSCULAR | Status: DC | PRN

## 2014-09-14 MED ORDER — ROSUVASTATIN CALCIUM 5 MG PO TABS
5.0000 mg | ORAL_TABLET | Freq: Every day | ORAL | Status: DC
  Administered 2014-09-15 – 2014-09-16 (×2): 5 mg via ORAL
  Filled 2014-09-14 (×3): qty 1

## 2014-09-14 MED ORDER — PERPHENAZINE 4 MG PO TABS
4.0000 mg | ORAL_TABLET | Freq: Every day | ORAL | Status: DC
  Administered 2014-09-14 – 2014-09-16 (×3): 4 mg via ORAL
  Filled 2014-09-14 (×4): qty 1

## 2014-09-14 MED ORDER — BRINZOLAMIDE 1 % OP SUSP
1.0000 [drp] | Freq: Every day | OPHTHALMIC | Status: DC
  Administered 2014-09-15 – 2014-09-16 (×2): 1 [drp] via OPHTHALMIC

## 2014-09-14 MED ORDER — PERPHENAZINE-AMITRIPTYLINE 4-25 MG PO TABS: 1.0000 | ORAL_TABLET | Freq: Every day | ORAL | Status: DC

## 2014-09-14 MED ORDER — SODIUM CHLORIDE 0.9 % IV SOLN
8.0000 mg/h | INTRAVENOUS | Status: DC
  Administered 2014-09-14 – 2014-09-15 (×2): 8 mg/h via INTRAVENOUS
  Filled 2014-09-14 (×4): qty 80

## 2014-09-14 MED ORDER — AMITRIPTYLINE HCL 25 MG PO TABS
25.0000 mg | ORAL_TABLET | Freq: Every day | ORAL | Status: DC
  Administered 2014-09-14 – 2014-09-16 (×3): 25 mg via ORAL
  Filled 2014-09-14 (×4): qty 1

## 2014-09-14 MED ORDER — SODIUM CHLORIDE 0.9 % IJ SOLN
3.0000 mL | Freq: Two times a day (BID) | INTRAMUSCULAR | Status: DC
  Administered 2014-09-15 – 2014-09-17 (×3): 3 mL via INTRAVENOUS

## 2014-09-14 MED ORDER — ESCITALOPRAM OXALATE 5 MG PO TABS
5.0000 mg | ORAL_TABLET | Freq: Every day | ORAL | Status: DC
  Administered 2014-09-14 – 2014-09-16 (×3): 5 mg via ORAL
  Filled 2014-09-14 (×5): qty 1

## 2014-09-14 MED ORDER — BRINZOLAMIDE 1 % OP SUSP
1.0000 [drp] | Freq: Two times a day (BID) | OPHTHALMIC | Status: DC
  Administered 2014-09-14 – 2014-09-17 (×5): 1 [drp] via OPHTHALMIC
  Filled 2014-09-14: qty 10

## 2014-09-14 MED ORDER — LATANOPROST 0.005 % OP SOLN
1.0000 [drp] | Freq: Every day | OPHTHALMIC | Status: DC
  Administered 2014-09-14 – 2014-09-16 (×3): 1 [drp] via OPHTHALMIC
  Filled 2014-09-14: qty 2.5

## 2014-09-14 MED ORDER — METOPROLOL TARTRATE 25 MG PO TABS
25.0000 mg | ORAL_TABLET | Freq: Every day | ORAL | Status: DC
  Administered 2014-09-14 – 2014-09-17 (×3): 25 mg via ORAL
  Filled 2014-09-14 (×4): qty 1

## 2014-09-14 MED ORDER — ONDANSETRON HCL 4 MG PO TABS: 4.0000 mg | ORAL_TABLET | Freq: Four times a day (QID) | ORAL | Status: DC | PRN

## 2014-09-14 MED ORDER — SODIUM CHLORIDE 0.9 % IV SOLN
80.0000 mg | Freq: Once | INTRAVENOUS | Status: AC
  Administered 2014-09-14: 80 mg via INTRAVENOUS
  Filled 2014-09-14: qty 80

## 2014-09-14 MED ORDER — SODIUM CHLORIDE 0.9 % IV SOLN
INTRAVENOUS | Status: DC
  Administered 2014-09-14 – 2014-09-15 (×2): via INTRAVENOUS

## 2014-09-14 NOTE — ED Provider Notes (Addendum)
CSN: [id number]     Arrival date & time 09/14/14  61 History   First MD Initiated Contact with Patient 09/14/14 1552     Chief Complaint  Patient presents with  . GI Bleeding      HPI  Patient presents for evaluation for admission regarding GI bleed and near syncope.  Patient has a history of proximal 5 years ago of tissue aortic valve replacement. History of palpitations for "many years" recently formally diagnosed with atrial fibrillation. Started on Xarelto by Dr. Martinique of cardiology approximately one month ago.  Has never had a history of any GI bleeding.  Had his last colonoscopy greater than 10 years ago. He states he had 2 benign polyps removed. Unknown if he has diverticuli. No history of upper GI bleeding or known ulcer disease. He does report some epigastric discomfort "like indigestion" for the last few weeks.  Today by Dr. Larkin Ina at [facility]. Had a guaiac positive black stool. He reported this for the last week. Patient reports lightheadedness and dizziness for the last several days as well. Had a hemoglobin of 11 and Dr. Dimple Nanas office. Had a near-syncopal episode in his office. Most recent documented hemoglobin per his primary care physician was November and was 13.  Sent here by his primary care physician for admission considering his drop in hemoglobin, anticoagulation, and symptoms.  Past Medical History  Diagnosis Date  . CAD (coronary artery disease)   . Aortic stenosis   . Atrial fibrillation   . Dyslipidemia   . Depression   . Hx of CABG   . Aortic valve prosthesis present   . Prostate ca    Past Surgical History  Procedure Laterality Date  . Coronary artery bypass graft  October 2011    LIMA to LAD  . Aortic valve replacement  October 2011    Magna Ease pericardial tissue valve #86mm  . Cataract extraction    . Corneal transplant    . Back surgery    . Tonsillectomy     Family History  Problem Relation Age of Onset  . Heart  attack Father    History  Substance Use Topics  . Smoking status: Former Smoker -- 1.00 packs/day for 40 years    Types: Cigarettes    Quit date: 11/16/1990  . Smokeless tobacco: Never Used  . Alcohol Use: 1.2 oz/week    2 Glasses of wine per week    Review of Systems  Constitutional: Negative for fever, chills, diaphoresis, appetite change and fatigue.  HENT: Negative for mouth sores, sore throat and trouble swallowing.   Eyes: Negative for visual disturbance.  Respiratory: Negative for cough, chest tightness, shortness of breath and wheezing.   Cardiovascular: Negative for chest pain.  Gastrointestinal: Positive for blood in stool. Negative for nausea, vomiting, abdominal pain, diarrhea and abdominal distention.       Describes mild discomfort in his epigastrium.  Endocrine: Negative for polydipsia, polyphagia and polyuria.  Genitourinary: Negative for dysuria, frequency and hematuria.  Musculoskeletal: Negative for gait problem.  Skin: Negative for color change, pallor and rash.  Neurological: Negative for dizziness, syncope, light-headedness and headaches.       Near-syncope  Hematological: Does not bruise/bleed easily.  Psychiatric/Behavioral: Negative for behavioral problems and confusion.      Allergies  Review of patient's allergies indicates no known allergies.  Home Medications   Prior to Admission medications   Medication Sig Start Date End Date Taking? Authorizing Provider  acetaminophen (TYLENOL) 500  MG tablet Take 500 mg by mouth every 6 (six) hours as needed for moderate pain.    Yes Historical Provider, MD  brinzolamide (AZOPT) 1 % ophthalmic suspension Place 1 drop into both eyes See admin instructions. TAKES 1 DROP BOTH EYES TWICE DAILY, AND THEN TAKES LEFT EYE ONLY AT LUNCH TIME.   Yes Historical Provider, MD  Calcium Carbonate-Vitamin D (CALCIUM-VITAMIN D) 500-200 MG-UNIT per tablet Take 1 tablet by mouth daily.   Yes Historical Provider, MD   Cholecalciferol 1000 UNITS capsule Take 1,000 Units by mouth daily.    Yes Historical Provider, MD  escitalopram (LEXAPRO) 10 MG tablet Take 5 mg by mouth daily.    Yes Historical Provider, MD  fluticasone (FLONASE) 50 MCG/ACT nasal spray Place 1 spray into both nostrils as needed for allergies or rhinitis.   Yes Historical Provider, MD  metoprolol tartrate (LOPRESSOR) 25 MG tablet TAKE 1 TABLET BY MOUTH EVERY DAY   Yes [name], MD  Misc Natural Products (GLUCOS-CHONDROIT-MSM COMPLEX) TABS Take 1 tablet by mouth daily.   Yes Historical Provider, MD  Multiple Vitamin (MULTIVITAMIN WITH MINERALS) TABS tablet Take 1 tablet by mouth daily.   Yes Historical Provider, MD  Multiple Vitamins-Minerals (ICAPS MV) TABS Take 1 tablet by mouth 2 (two) times daily.   Yes Historical Provider, MD  omega-3 acid ethyl esters (LOVAZA) 1 G capsule Take 1 g by mouth daily.    Yes Historical Provider, MD  perphenazine-amitriptyline (ETRAFON/TRIAVIL) 4-25 MG TABS Take 1 tablet by mouth at bedtime.   Yes Historical Provider, MD  ranitidine (ZANTAC) 150 MG tablet Take 150 mg by mouth daily.   Yes Historical Provider, MD  rosuvastatin (CRESTOR) 5 MG tablet Take 5 mg by mouth daily at 6 PM.   Yes Historical Provider, MD  senna-docusate (SENOKOT-S) 8.6-50 MG per tablet Take 1 tablet by mouth at bedtime.    Yes Historical Provider, MD  TRAVATAN Z 0.004 % SOLN ophthalmic solution Place 1 drop into both eyes at bedtime. 07/14/14  Yes Historical Provider, MD  vitamin B-12 (CYANOCOBALAMIN) 1000 MCG tablet Take 1,000 mcg by mouth daily.   Yes Historical Provider, MD  rivaroxaban (XARELTO) 20 MG TABS tablet Take 1 tablet (20 mg total) by mouth daily with supper. Patient not taking: Reported on 09/14/2014 07/15/14   [name], MD   BP 134/72 mmHg  Pulse 98  Temp(Src) 98.2 F (36.8 C) (Oral)  Resp 15  SpO2 96% Physical Exam  Constitutional: He is oriented to person, place, and time. He appears well-developed and  well-nourished. No distress.  HENT:  Head: Normocephalic.  Eyes: Conjunctivae are normal. Pupils are equal, round, and reactive to light. No scleral icterus.  Conjunctiva appear only slightly paled. Not frankly qwhite.  Neck: Normal range of motion. Neck supple. No thyromegaly present.  Cardiovascular: Normal rate and regular rhythm.  Exam reveals no gallop and no friction rub.   No murmur heard. Pulmonary/Chest: Effort normal and breath sounds normal. No respiratory distress. He has no wheezes. He has no rales.  Abdominal: Soft. Bowel sounds are normal. He exhibits no distension. There is no tenderness. There is no rebound.  Soft benign abdomen. No guarding, rebound, or peritoneal irritation.  Musculoskeletal: Normal range of motion.  Neurological: He is alert and oriented to person, place, and time.  Skin: Skin is warm and dry. No rash noted.  Psychiatric: He has a normal mood and affect. His behavior is normal.    ED Course  Procedures (including critical  care time) Labs Review Labs Reviewed  CBC WITH DIFFERENTIAL - Abnormal; Notable for the following:    WBC 11.1 (*)    RBC 3.64 (*)    Hemoglobin 10.8 (*)    HCT 32.9 (*)    Neutrophils Relative % 85 (*)    Neutro Abs 9.5 (*)    Lymphocytes Relative 8 (*)    All other components within normal limits  COMPREHENSIVE METABOLIC PANEL - Abnormal; Notable for the following:    Glucose, Bld 115 (*)    GFR calc non Af Amer 64 (*)    GFR calc Af Amer 74 (*)    All other components within normal limits  PROTIME-INR - Abnormal; Notable for the following:    Prothrombin Time 16.8 (*)    All other components within normal limits  TYPE AND SCREEN    Imaging Review No results found.   EKG Interpretation   Date/Time:  Monday September 14 2014 18:10:45 EST Ventricular Rate:  98 PR Interval:    QRS Duration: 119 QT Interval:  385 QTC Calculation: 492 R Axis:   -68 Text Interpretation:  Atrial fibrillation Left anterior fascicular  block  Anterior infarct, old Confirmed by Jeneen Rinks  MD, Pike ([id number]) on 09/14/2014  6:13:56 PM      MDM   Final diagnoses:  Upper GI bleed    Patient with probable upper GI bleed anticoagulated on Xarelto. Hemoglobin confirmed to 11.1. Platelet count normal 15. INR 1.35. I placed a call to [facility] hospital[facility]. I discussed the case with Dr. Fuller Plan of [facility]. Patient has undergone type and screen.    Tanna Furry, MD 09/14/14 1814  Tanna Furry, MD 09/15/14 Laureen Abrahams

## 2014-09-14 NOTE — ED Notes (Addendum)
Pt presents to ED via EMS with c/o dizziness when standing and dark stool x2 weeks. Pt on xarelto for A-Fib x1 month, last dose last night. Pt came from his PCP at [facility]. Pt reports that labs done and Hemoglobin 11, dropped from 13. Pt was instructed by PCP to stop Xarelto for few days. Per EMS, BP-154/88, O2-100% on room air, HR-90. 20g right hand IV line established by EMS. Pt alerts and oriented x4 at this time, airway intact.

## 2014-09-14 NOTE — ED Notes (Signed)
Dr. Newton at bedside. 

## 2014-09-14 NOTE — H&P (Addendum)
Hospitalist Admission History and Physical  Patient name: Jason [name] Medical record number: [id number] Date of birth: 02-Jul-1926 Age: 81 y.o. Gender: male  Primary Care Provider: Jerlyn Ly, MD  Chief Complaint: GIB  History of Present Illness:This is a 81 y.o. year old male with significant past medical history of CAD s/p CABG, aortic stenosis s/p tissue valve replacement, atrial fibrillation recently started on xarelto  presenting with GIB. Pt states that he had a cardiologist appt approx 1 month ago when during visit, he was noted to be in afib. Pt was started on xarelto at this point. Pt states that over the past 2 weeks, he has had black/maroon colored stools as well as weakness, dizziness, and decreased appetite. Pt had progressively worsening sxs and went to see his PCP today. Pt was noted to be hemoccult positive in the office w/ noted hgb 11. Pt states that his baseline hgb 13. Was noted to be mildly weak in office and redirected to ER. Pt denies any CP, SOB, diaphoresis, nausea. Denies any recent NSAID use.  Noted colonoscopy in 2010 w/ diverticular disease and sessile polyp in rectum Sharlett Iles) Presented to ER T 98.2, HR 80s-100s, resp 10s, BP 130s-150s, satting 94% on RA. WNC 11.1, hgb 10.8, Cr 1.03. EDP Jeneen Rinks spoke w/ Dr. Fuller Plan who will see pt in am.   Assessment and Plan: Jason [name] is a 81 y.o. year old male presenting with GIB   Active Problems:   GIB (gastrointestinal bleeding)  1- GIB  -likely secondary to anticoagulation  -noted baseline diverticular disease and +polyps on colonoscopy 2010 -no abd pain -serial CBCs -hold offending agent  -IV PPI  -f/u GI recs ([place]) in am  -NPO   2- Weakness -likely secondary to above  -intermittent mild sxs over past 2 weeks  -no active CP  -check trop -EKG rate controlled afib w/ significant ST/T wave changes.  -cycle CEs -check orthostatics -mildly dry on exam-gently hydrate in setting of NPO  status  3- CAD s/p CABG, AS s/p tissue aortic valve  -no active CP  -06/2014 2D ECHO w/ normal LV function and functioning aortic valve, mild MR  -cont home regimen -tele bed   4- Afib -CHADsVASc score around 3 -hold anticoagulation give above -rate controlled currently  -cont BB -06/2014 2D ECHO w/ normal LV function and functioning aortic valve, mild MR  -cards consult in am given above  -tele bed   FEN/GI: NPO. PPO  Prophylaxis: SCDs  Disposition: pending further evaluation  Code Status:Full Code    Patient Active Problem List   Diagnosis Date Noted  . GIB (gastrointestinal bleeding) 09/14/2014  . Atrial fibrillation 07/15/2014  . PVC's (premature ventricular contractions) 12/05/2012  . Fatigue 12/12/2011  . Heart palpitations 01/24/2011  . CAD (coronary artery disease)   . Aortic stenosis   . Dyslipidemia   . Depression   . ANXIETY 11/06/2008  . Aortic valve disorder 11/06/2008  . CONSTIPATION 11/06/2008  . IDIOPATHIC OSTEOPOROSIS 11/06/2008  . SLEEP APNEA 11/06/2008  . HEMORRHOIDS 11/05/2008  . DIVERTICULOSIS, COLON 11/05/2008  . Personal history of colonic polyps 11/05/2008   Past Medical History: Past Medical History  Diagnosis Date  . CAD (coronary artery disease)   . Aortic stenosis   . Atrial fibrillation   . Dyslipidemia   . Depression   . Hx of CABG   . Aortic valve prosthesis present   . Prostate ca     Past Surgical History: Past Surgical History  Procedure Laterality Date  .  Coronary artery bypass graft  October 2011    LIMA to LAD  . Aortic valve replacement  October 2011    Magna Ease pericardial tissue valve #47mm  . Cataract extraction    . Corneal transplant    . Back surgery    . Tonsillectomy      Social History: History   Social History  . Marital Status: Married    Spouse Name: N/A    Number of Children: N/A  . Years of Education: N/A   Social History Main Topics  . Smoking status: Former Smoker -- 1.00 packs/day  for 40 years    Types: Cigarettes    Quit date: 11/16/1990  . Smokeless tobacco: Never Used  . Alcohol Use: 1.2 oz/week    2 Glasses of wine per week  . Drug Use: No  . Sexual Activity: None   Other Topics Concern  . None   Social History Narrative    Family History: Family History  Problem Relation Age of Onset  . Heart attack Father     Allergies: No Known Allergies  Current Facility-Administered Medications  Medication Dose Route Frequency Provider Last Rate Last Dose  . 0.9 %  sodium chloride infusion   Intravenous Continuous Shanda Howells, MD      . brinzolamide (AZOPT) 1 % ophthalmic suspension 1 drop  1 drop Both Eyes See admin instructions Shanda Howells, MD      . escitalopram (LEXAPRO) tablet 5 mg  5 mg Oral Daily Shanda Howells, MD      . metoprolol tartrate (LOPRESSOR) tablet 25 mg  25 mg Oral Daily Shanda Howells, MD      . ondansetron [facility]) tablet 4 mg  4 mg Oral Q6H PRN Shanda Howells, MD       Or  . ondansetron [facility]) injection 4 mg  4 mg Intravenous Q6H PRN Shanda Howells, MD      . pantoprazole (PROTONIX) 80 mg in sodium chloride 0.9 % 100 mL IVPB  80 mg Intravenous Once Tanna Furry, MD      . pantoprazole (PROTONIX) 80 mg in sodium chloride 0.9 % 250 mL (0.32 mg/mL) infusion  8 mg/hr Intravenous Continuous Tanna Furry, MD      . perphenazine-amitriptyline (ETRAFON/TRIAVIL) 4-25 MG per tablet 1 tablet  1 tablet Oral QHS Shanda Howells, MD      . Derrill Memo ON 09/15/2014] rosuvastatin (CRESTOR) tablet 5 mg  5 mg Oral q1800 Shanda Howells, MD      . sodium chloride 0.9 % injection 3 mL  3 mL Intravenous Q12H Shanda Howells, MD       Current Outpatient Prescriptions  Medication Sig Dispense Refill  . acetaminophen (TYLENOL) 500 MG tablet Take 500 mg by mouth every 6 (six) hours as needed for moderate pain.     . brinzolamide (AZOPT) 1 % ophthalmic suspension Place 1 drop into both eyes See admin instructions. TAKES 1 DROP BOTH EYES TWICE DAILY, AND THEN TAKES LEFT EYE  ONLY AT LUNCH TIME.    . Calcium Carbonate-Vitamin D (CALCIUM-VITAMIN D) 500-200 MG-UNIT per tablet Take 1 tablet by mouth daily.    . Cholecalciferol 1000 UNITS capsule Take 1,000 Units by mouth daily.     [name] escitalopram (LEXAPRO) 10 MG tablet Take 5 mg by mouth daily.     . fluticasone (FLONASE) 50 MCG/ACT nasal spray Place 1 spray into both nostrils as needed for allergies or rhinitis.    . metoprolol tartrate (LOPRESSOR) 25 MG tablet TAKE 1 TABLET BY  MOUTH EVERY DAY 90 tablet 3  . Misc Natural Products (GLUCOS-CHONDROIT-MSM COMPLEX) TABS Take 1 tablet by mouth daily.    . Multiple Vitamin (MULTIVITAMIN WITH MINERALS) TABS tablet Take 1 tablet by mouth daily.    . Multiple Vitamins-Minerals (ICAPS MV) TABS Take 1 tablet by mouth 2 (two) times daily.    [name] omega-3 acid ethyl esters (LOVAZA) 1 G capsule Take 1 g by mouth daily.     [name] perphenazine-amitriptyline (ETRAFON/TRIAVIL) 4-25 MG TABS Take 1 tablet by mouth at bedtime.    . ranitidine (ZANTAC) 150 MG tablet Take 150 mg by mouth daily.    . rosuvastatin (CRESTOR) 5 MG tablet Take 5 mg by mouth daily at 6 PM.    . senna-docusate (SENOKOT-S) 8.6-50 MG per tablet Take 1 tablet by mouth at bedtime.     . TRAVATAN Z 0.004 % SOLN ophthalmic solution Place 1 drop into both eyes at bedtime.    . vitamin B-12 (CYANOCOBALAMIN) 1000 MCG tablet Take 1,000 mcg by mouth daily.    . rivaroxaban (XARELTO) 20 MG TABS tablet Take 1 tablet (20 mg total) by mouth daily with supper. (Patient not taking: Reported on 09/14/2014) 30 tablet 11   Review Of Systems: 12 point ROS negative except as noted above in HPI.  Physical Exam: Filed Vitals:   09/14/14 1900  BP: 137/71  Pulse: 100  Temp:   Resp: 17    General: alert and cooperative HEENT: PERRLA and extra ocular movement intact Heart: S1, S2 normal, no murmur, rub or gallop, regular rate and rhythm Lungs: clear to auscultation, no wheezes or rales and unlabored breathing Abdomen: abdomen is soft  without significant tenderness, masses, organomegaly or guarding Extremities: extremities normal, atraumatic, no cyanosis or edema Skin:no rashes Neurology: normal without focal findings  Labs and Imaging: Lab Results  Component Value Date/Time   NA 140 09/14/2014 04:50 PM   K 3.8 09/14/2014 04:50 PM   CL 104 09/14/2014 04:50 PM   CO2 26 09/14/2014 04:50 PM   BUN 9 09/14/2014 04:50 PM   CREATININE 1.03 09/14/2014 04:50 PM   GLUCOSE 115* 09/14/2014 04:50 PM   Lab Results  Component Value Date   WBC 11.1* 09/14/2014   HGB 10.8* 09/14/2014   HCT 32.9* 09/14/2014   MCV 90.4 09/14/2014   PLT 215 09/14/2014    No results found.         Shanda Howells MD  Pager: [phone]

## 2014-09-15 ENCOUNTER — Encounter (HOSPITAL_COMMUNITY): Payer: Self-pay | Admitting: Physician Assistant

## 2014-09-15 DIAGNOSIS — G4733 Obstructive sleep apnea (adult) (pediatric): Secondary | ICD-10-CM

## 2014-09-15 DIAGNOSIS — R42 Dizziness and giddiness: Secondary | ICD-10-CM

## 2014-09-15 DIAGNOSIS — I482 Chronic atrial fibrillation: Secondary | ICD-10-CM

## 2014-09-15 DIAGNOSIS — Z7901 Long term (current) use of anticoagulants: Secondary | ICD-10-CM

## 2014-09-15 LAB — COMPREHENSIVE METABOLIC PANEL
ALK PHOS: 44 U/L (ref 39–117)
ALT: 13 U/L (ref 0–53)
AST: 21 U/L (ref 0–37)
Albumin: 3.5 g/dL (ref 3.5–5.2)
Anion gap: 7 (ref 5–15)
BILIRUBIN TOTAL: 0.6 mg/dL (ref 0.3–1.2)
BUN: 9 mg/dL (ref 6–23)
CO2: 27 mmol/L (ref 19–32)
Calcium: 8.7 mg/dL (ref 8.4–10.5)
Chloride: 106 mEq/L (ref 96–112)
Creatinine, Ser: 1 mg/dL (ref 0.50–1.35)
GFR calc Af Amer: 76 mL/min — ABNORMAL LOW (ref >90)
GFR calc non Af Amer: 66 mL/min — ABNORMAL LOW (ref >90)
GLUCOSE: 100 mg/dL — AB (ref 70–99)
Potassium: 3.9 mmol/L (ref 3.5–5.1)
Sodium: 140 mmol/L (ref 135–145)
Total Protein: 6.6 g/dL (ref 6.0–8.3)

## 2014-09-15 LAB — CBC WITH DIFFERENTIAL/PLATELET
BASOS ABS: 0.1 10*3/uL (ref 0.0–0.1)
Basophils Absolute: 0.1 10*3/uL (ref 0.0–0.1)
Basophils Relative: 1 % (ref 0–1)
Basophils Relative: 1 % (ref 0–1)
EOS ABS: 0.1 10*3/uL (ref 0.0–0.7)
Eosinophils Absolute: 0.2 10*3/uL (ref 0.0–0.7)
Eosinophils Relative: 2 % (ref 0–5)
Eosinophils Relative: 2 % (ref 0–5)
HCT: 30.7 % — ABNORMAL LOW (ref 39.0–52.0)
HEMATOCRIT: 31 % — AB (ref 39.0–52.0)
HEMOGLOBIN: 10 g/dL — AB (ref 13.0–17.0)
Hemoglobin: 10.1 g/dL — ABNORMAL LOW (ref 13.0–17.0)
LYMPHS ABS: 1.4 10*3/uL (ref 0.7–4.0)
LYMPHS PCT: 18 % (ref 12–46)
LYMPHS PCT: 25 % (ref 12–46)
Lymphs Abs: 1.8 10*3/uL (ref 0.7–4.0)
MCH: 29.4 pg (ref 26.0–34.0)
MCH: 29.9 pg (ref 26.0–34.0)
MCHC: 32.3 g/dL (ref 30.0–36.0)
MCHC: 32.9 g/dL (ref 30.0–36.0)
MCV: 91.2 fL (ref 78.0–100.0)
MCV: 90.8 fL (ref 78.0–100.0)
MONO ABS: 0.7 10*3/uL (ref 0.1–1.0)
MONOS PCT: 10 % (ref 3–12)
Monocytes Absolute: 0.8 10*3/uL (ref 0.1–1.0)
Monocytes Relative: 10 % (ref 3–12)
Neutro Abs: 5.5 10*3/uL (ref 1.7–7.7)
Neutro Abs: 4.6 10*3/uL (ref 1.7–7.7)
Neutrophils Relative %: 69 % (ref 43–77)
Neutrophils Relative %: 62 % (ref 43–77)
Platelets: 199 10*3/uL (ref 150–400)
Platelets: 208 10*3/uL (ref 150–400)
RBC: 3.4 MIL/uL — ABNORMAL LOW (ref 4.22–5.81)
RBC: 3.38 MIL/uL — AB (ref 4.22–5.81)
RDW: 13.4 % (ref 11.5–15.5)
RDW: 13.3 % (ref 11.5–15.5)
WBC: 7.9 10*3/uL (ref 4.0–10.5)
WBC: 7.3 10*3/uL (ref 4.0–10.5)

## 2014-09-15 LAB — TROPONIN I: TROPONIN I: 0.05 ng/mL — AB (ref <0.031)

## 2014-09-15 MED ORDER — ACETAMINOPHEN 500 MG PO TABS
1000.0000 mg | ORAL_TABLET | Freq: Every day | ORAL | Status: DC
  Administered 2014-09-15 – 2014-09-16 (×2): 1000 mg via ORAL
  Filled 2014-09-15 (×3): qty 2

## 2014-09-15 MED ORDER — MENTHOL 3 MG MT LOZG
1.0000 | LOZENGE | OROMUCOSAL | Status: DC | PRN
  Filled 2014-09-15: qty 9

## 2014-09-15 MED ORDER — PANTOPRAZOLE SODIUM 40 MG PO TBEC
40.0000 mg | DELAYED_RELEASE_TABLET | Freq: Two times a day (BID) | ORAL | Status: DC
  Administered 2014-09-15 – 2014-09-17 (×3): 40 mg via ORAL
  Filled 2014-09-15 (×4): qty 1

## 2014-09-15 MED ORDER — ACETAMINOPHEN 325 MG PO TABS: 650.0000 mg | ORAL_TABLET | Freq: Three times a day (TID) | ORAL | Status: DC | PRN

## 2014-09-15 MED ORDER — SODIUM CHLORIDE 0.9 % IV SOLN
INTRAVENOUS | Status: DC
  Administered 2014-09-15 – 2014-09-16 (×2): via INTRAVENOUS

## 2014-09-15 NOTE — Progress Notes (Signed)
PROGRESS NOTE    Jason [name] ZWC:[id number] DOB: [birth year] DOA: 09/14/2014 PCP: Jerlyn Ly, MD  Primary Cardiologist: Dr. Peter Martinique  HPI/Brief narrative 81 y.o. year old male with significant past medical history of CAD s/p CABG, aortic stenosis s/p tissue valve replacement, atrial fibrillation recently started on xarelto, presented with one-week history of progressive weakness, dizziness, dark stools, seen by PCP on 1/18 and was nearly syncopal, Hemoccult positive, hemoglobin 11 (baseline 13) and admitted for further evaluation. Denied chest pain or dyspnea. No recent NSAID use.Presented to ER T 98.2, HR 80s-100s, resp 10s, BP 130s-150s, satting 94% on RA. WNC 11.1, hgb 10.8, Cr 1.03. Admitted for evaluation of upper GI bleed.   Assessment/Plan:  1. Upper GI bleed, likely upper: In the context of Xarelto- last dose 1/17 p.m. Nothing by mouth, IV PPI, held Xarelto. [place] [facility] consulted to further evaluate and recommend regarding timing of reinitiation of anticoagulation. 2. Acute post hemorrhagic anemia: Secondary to GI bleed. Hemoglobin has dropped from baseline of 13 and stabilized in the 10 g per DL range. Follow closely and transfuse if hemoglobin less than 7 g per DL. 3. Dizziness/weakness/presyncope: Secondary to acute blood loss. Brief IV fluids. 4. A. fib with controlled ventricular rate: CHADsVASc score around 3. Anticoagulation held secondary to GI bleed. Continue metoprolol. 2-D echo 06/2014 with normal LV function, functioning aortic 12 and mild MR. Updated primary cardiologist 1/19. 5. H/o CAD s/p CABG, AS s/p tissue aortic valve : Asymptomatic and stable.Solitary minimally elevated troponin likely from demand ischemia. Subsequent troponin negative. 6. OSA: Continue nightly C Pap.   Code Status: Full Family Communication: Discussed with spouse at bedside. Disposition Plan: Home when medically  stable.   Consultants:  Gastroenterology  Procedures:  None  Antibiotics:  None   Subjective: Feels better. No BM since admission. Denies chest pain or dyspnea. Mild sore throat. States that he was unable to sleep well last night because he did not get his usual home nightly dose of Tylenol 1000 mg.  Objective: Filed Vitals:   09/14/14 2108 09/14/14 2320 09/15/14 0530 09/15/14 0957  BP: 155/52  142/74 132/73  Pulse: 84 85 76 89  Temp: 98 F (36.7 C)  98.6 F (37 C) 98.2 F (36.8 C)  TempSrc: Oral  Oral Oral  Resp: 16 18 17 18   Height: 5\' 11"  (1.803 m)     Weight: 81.421 kg (179 lb 8 oz)     SpO2: 100%  99% 98%    Intake/Output Summary (Last 24 hours) at 09/15/14 1140 Last data filed at 09/15/14 1100  Gross per 24 hour  Intake      3 ml  Output    325 ml  Net   -322 ml   Filed Weights   09/14/14 2108  Weight: 81.421 kg (179 lb 8 oz)     Exam:  General exam: Pleasant elderly male lying comfortably supine in bed. Hard of hearing. Respiratory system: Clear. No increased work of breathing. Cardiovascular system: S1 & S2 heard, irregularly irregular. No JVD, murmurs, gallops, clicks or pedal edema. Telemetry: A. fib with ventricular rate 70-90, BBB morphology, occasional PVCs. Gastrointestinal system: Abdomen is nondistended, soft and nontender. Normal bowel sounds heard. Central nervous system: Alert and oriented. No focal neurological deficits. Extremities: Symmetric 5 x 5 power.   Data Reviewed: Basic Metabolic Panel:  Recent Labs Lab 09/14/14 1650 09/15/14 0119  NA 140 140  K 3.8 3.9  CL 104 106  CO2 26 27  GLUCOSE 115*  100*  BUN 9 9  CREATININE 1.03 1.00  CALCIUM 8.8 8.7   Liver Function Tests:  Recent Labs Lab 09/14/14 1650 09/15/14 0119  AST 20 21  ALT 15 13  ALKPHOS 49 44  BILITOT 0.7 0.6  PROT 7.3 6.6  ALBUMIN 3.9 3.5   No results for input(s): LIPASE, AMYLASE in the last 168 hours. No results for input(s): AMMONIA in the last  168 hours. CBC:  Recent Labs Lab 09/14/14 1650 09/14/14 1941 09/15/14 0714  WBC 11.1* 10.7* 7.9  NEUTROABS 9.5* 8.7* 5.5  HGB 10.8* 10.4* 10.0*  HCT 32.9* 31.9* 31.0*  MCV 90.4 90.1 91.2  PLT 215 206 199   Cardiac Enzymes:  Recent Labs Lab 09/14/14 1957 09/15/14 0119 09/15/14 0714  TROPONINI 0.03 0.05* <0.03   BNP (last 3 results) No results for input(s): PROBNP in the last 8760 hours. CBG: No results for input(s): GLUCAP in the last 168 hours.  No results found for this or any previous visit (from the past 240 hour(s)).       Studies: No results found.      Scheduled Meds: . acetaminophen  1,000 mg Oral QHS  . perphenazine  4 mg Oral QHS   And  . amitriptyline  25 mg Oral QHS  . brinzolamide  1 drop Both Eyes BID  . brinzolamide  1 drop Left Eye QPC lunch  . escitalopram  5 mg Oral Daily  . latanoprost  1 drop Both Eyes QHS  . metoprolol tartrate  25 mg Oral Daily  . rosuvastatin  5 mg Oral q1800  . sodium chloride  3 mL Intravenous Q12H   Continuous Infusions: . sodium chloride 100 mL/hr at 09/15/14 0609  . pantoprozole (PROTONIX) infusion 8 mg/hr (09/15/14 0717)    Active Problems:   GIB (gastrointestinal bleeding)    Time spent: 79 minutes    HONGALGI,ANAND, MD, FACP, FHM. [facility] Pager [phone]  If 7PM-7AM, please contact night-coverage [email or web address] Password [email or web address] 09/15/2014, 11:40 AM    LOS: 1 day

## 2014-09-15 NOTE — Progress Notes (Signed)
RT note: Placed pt on CPAP Auto titrate (min5-max15) via nasal mask. Pt tolerating CPAP well without any complications.

## 2014-09-15 NOTE — Consult Note (Signed)
[facility] Consult: 11:36 AM 09/15/2014  LOS: 1 day    Referring Provider: Algis Liming  Primary Care Physician:  Jerlyn Ly, MD Primary Gastroenterologist:  Dr. Sharlett Iles    Reason for Consultation:  Jason [name] , FOBT + stool   HPI: Jason [name] is a 81 y.o. male.  Hx CAD s/p CABG, s/p tissue aortic valve replacement, atrial fibrillation started Xarelto as of 06/2014. OSA, on CPAP at night.    Stools have been dark but formed and not tarry for about 2 weeks.  Had episode dizziness last week and go presyncopal, diaphoretic at Dr Perini's office yesterday.  FOBT +. Transported to admission at [facility] via ambulance from his office.  Hgb 10.8, today it is 10.  Hgb in November 2011 was 8.3 to 9.0.  Apparently, outpatient hemoglobins recently have been 13-14 range per patient. BUN not elevated.  Previous Colonoscopies (below) but never had EGD.  Has been advised that due to advanced age, will not need another colonoscopy.  Has some upper abdominal burning.  On daily Ranitidine for many years.  No ASA, no NSAIDs.     ENDOSCOPIC STUDIES: 2010 Colonoscopy  Dr Sharlett Iles INDICATIONS:  history of polyps, constipation  ENDOSCOPIC IMPRESSION: 1) Moderate diverticulosis in the sigmoid to descending 2) Sessile polyp in the rectum 3) Otherwise normal examination Pathology: COLON, RECTAL POLYP: TUBULAR ADENOMA. NO HIGH GRADE DYSPLASIA OR MALIGNANCY IDENTIFIED. Plan for 10/2013 follow up colonoscopy  10/2004 Colonoscopy For adenomatous polyp surveillance.  Abnormal examination:  211.3: Colon Polyps. (Unable to locate pathology report.)  562.10: Diverticulosis.  455.6: Hemorrhoids, Internal and External.     Past Medical History  Diagnosis Date  . CAD (coronary artery disease)   . Aortic stenosis   . Atrial  fibrillation   . Dyslipidemia   . Depression   . Hx of CABG   . Aortic valve prosthesis present   . Prostate ca     Past Surgical History  Procedure Laterality Date  . Coronary artery bypass graft  October 2011    LIMA to LAD  . Aortic valve replacement  October 2011    Magna Ease pericardial tissue valve #22mm  . Cataract extraction    . Corneal transplant    . Back surgery    . Tonsillectomy      Prior to Admission medications   Medication Sig Start Date End Date Taking? Authorizing Provider  acetaminophen (TYLENOL) 500 MG tablet Take 500 mg by mouth every 6 (six) hours as needed for moderate pain.    Yes Historical Provider, MD  brinzolamide (AZOPT) 1 % ophthalmic suspension Place 1 drop into both eyes See admin instructions. TAKES 1 DROP BOTH EYES TWICE DAILY, AND THEN TAKES LEFT EYE ONLY AT LUNCH TIME.   Yes Historical Provider, MD  Calcium Carbonate-Vitamin D (CALCIUM-VITAMIN D) 500-200 MG-UNIT per tablet Take 1 tablet by mouth daily.   Yes Historical Provider, MD  Cholecalciferol 1000 UNITS capsule Take 1,000 Units by mouth daily.    Yes Historical Provider, MD  escitalopram (LEXAPRO) 10 MG tablet Take 5 mg by  mouth daily.    Yes Historical Provider, MD  fluticasone (FLONASE) 50 MCG/ACT nasal spray Place 1 spray into both nostrils as needed for allergies or rhinitis.   Yes Historical Provider, MD  metoprolol tartrate (LOPRESSOR) 25 MG tablet TAKE 1 TABLET BY MOUTH EVERY DAY   Yes [name], MD  Misc Natural Products (GLUCOS-CHONDROIT-MSM COMPLEX) TABS Take 1 tablet by mouth daily.   Yes Historical Provider, MD  Multiple Vitamin (MULTIVITAMIN WITH MINERALS) TABS tablet Take 1 tablet by mouth daily.   Yes Historical Provider, MD  Multiple Vitamins-Minerals (ICAPS MV) TABS Take 1 tablet by mouth 2 (two) times daily.   Yes Historical Provider, MD  omega-3 acid ethyl esters (LOVAZA) 1 G capsule Take 1 g by mouth daily.    Yes Historical Provider, MD  perphenazine-amitriptyline  (ETRAFON/TRIAVIL) 4-25 MG TABS Take 1 tablet by mouth at bedtime.   Yes Historical Provider, MD  ranitidine (ZANTAC) 150 MG tablet Take 150 mg by mouth daily.   Yes Historical Provider, MD  rosuvastatin (CRESTOR) 5 MG tablet Take 5 mg by mouth daily at 6 PM.   Yes Historical Provider, MD  senna-docusate (SENOKOT-S) 8.6-50 MG per tablet Take 1 tablet by mouth at bedtime.    Yes Historical Provider, MD  TRAVATAN Z 0.004 % SOLN ophthalmic solution Place 1 drop into both eyes at bedtime. 07/14/14  Yes Historical Provider, MD  vitamin B-12 (CYANOCOBALAMIN) 1000 MCG tablet Take 1,000 mcg by mouth daily.   Yes Historical Provider, MD  rivaroxaban (XARELTO) 20 MG TABS tablet Take 1 tablet (20 mg total) by mouth daily with supper. Patient not taking: Reported on 09/14/2014 07/15/14   [name], MD    Scheduled Meds: . acetaminophen  1,000 mg Oral QHS  . perphenazine  4 mg Oral QHS   And  . amitriptyline  25 mg Oral QHS  . brinzolamide  1 drop Both Eyes BID  . brinzolamide  1 drop Left Eye QPC lunch  . escitalopram  5 mg Oral Daily  . latanoprost  1 drop Both Eyes QHS  . metoprolol tartrate  25 mg Oral Daily  . rosuvastatin  5 mg Oral q1800  . sodium chloride  3 mL Intravenous Q12H   Infusions: . sodium chloride 100 mL/hr at 09/15/14 0609  . pantoprozole (PROTONIX) infusion 8 mg/hr (09/15/14 0717)   PRN Meds: acetaminophen, menthol-cetylpyridinium, ondansetron **OR** ondansetron (ZOFRAN) IV   Allergies as of 09/14/2014  . (No Known Allergies)    Family History  Problem Relation Age of Onset  . Heart attack Father     History   Social History  . Marital Status: Married    Spouse Name: N/A    Number of Children: N/A  . Years of Education: N/A   Occupational History  . Not on file.   Social History Main Topics  . Smoking status: Former Smoker -- 1.00 packs/day for 40 years    Types: Cigarettes    Quit date: 11/16/1990  . Smokeless tobacco: Never Used  . Alcohol Use: 1.2  oz/week    2 Glasses of wine per week  . Drug Use: No  . Sexual Activity: Not on file   Other Topics Concern  . Not on file   Social History Narrative    REVIEW OF SYSTEMS: Constitutional:  Generally active.  Weight loss of 8# in last 3 months ENT:  No nose bleeds Pulm:  No SOB or cough CV:  No palpitations, no LE edema.  GU:  No hematuria, no frequency GI:  Per HPI.  No dysphagia.  Norm is 2 to 3 BMs daily.  Heme:  No unusual bleeding or excessive bruising   Transfusions:  None ever Neuro:  No headaches, no peripheral tingling or numbness Derm:  No itching, no rash or sores.  Endocrine:  No sweats or chills.  No polyuria or dysuria Immunization:  Not queried.    PHYSICAL EXAM: Vital signs in last 24 hours: Filed Vitals:   09/15/14 0957  BP: 132/73  Pulse: 89  Temp: 98.2 F (36.8 C)  Resp: 18   Wt Readings from Last 3 Encounters:  09/14/14 179 lb 8 oz (81.421 kg)  07/15/14 181 lb 9.6 oz (82.373 kg)  01/12/14 190 lb (86.183 kg)    General: pleasant, looks well. comfortable Head:  No swelling, asymmetry or signs trauma  Eyes:  No conj pallor Ears:  Not HOH  Nose:  No discharge Mouth:  Clear, moist oral MM.  Tongue midline Neck:  No JVD, no masses, no TMG Lungs:  Clear bil.  Heart: no audible valve snap.  Irreg, irreg with rate controlled.  Abdomen:  Soft, NT, ND.  No masses or organomegaly.  No bruits. .   Rectal: deferred   Musc/Skeltl: no joint deformity or erythema.  No contractures Extremities:  No CCE  Neurologic:  Pleasant, oriented x 3.  No tremor or limb weakness.  Skin:  No telangectasia.  Tattoos:  none Nodes:  No cervical adenopathy.   Psych:  Cooperative, relaxed, in good spirits.   Intake/Output from previous day:   Intake/Output this shift: Total I/O In: 3 [I.V.:3] Out: 325 [Urine:325]  LAB RESULTS:  Recent Labs  09/14/14 1650 09/14/14 1941 09/15/14 0714  WBC 11.1* 10.7* 7.9  HGB 10.8* 10.4* 10.0*  HCT 32.9* 31.9* 31.0*  PLT  215 206 199  MCV                                          91  BMET Lab Results  Component Value Date   NA 140 09/15/2014   NA 140 09/14/2014   NA 139 01/03/2012   K 3.9 09/15/2014   K 3.8 09/14/2014   K 4.0 01/03/2012   CL 106 09/15/2014   CL 104 09/14/2014   CL 103 01/03/2012   CO2 27 09/15/2014   CO2 26 09/14/2014   CO2 28 01/03/2012   GLUCOSE 100* 09/15/2014   GLUCOSE 115* 09/14/2014   GLUCOSE 101* 01/03/2012   BUN 9 09/15/2014   BUN 9 09/14/2014   BUN 16 01/03/2012   CREATININE 1.00 09/15/2014   CREATININE 1.03 09/14/2014   CREATININE 1.0 01/03/2012   CALCIUM 8.7 09/15/2014   CALCIUM 8.8 09/14/2014   CALCIUM 9.1 01/03/2012   LFT  Recent Labs  09/14/14 1650 09/15/14 0119  PROT 7.3 6.6  ALBUMIN 3.9 3.5  AST 20 21  ALT 15 13  ALKPHOS 49 44  BILITOT 0.7 0.6   PT/INR Lab Results  Component Value Date   INR 1.35 09/14/2014   INR 2.70* 07/19/2010   INR 3.10* 07/18/2010    RADIOLOGY STUDIES: No results found.    IMPRESSION:   *  Dark, FOBT + stools with symptomatic anemia.   Has not required transfusion thus far. BUN is not elevated  *  A fib.  Xarelto added 06/2014, last dose was Sunday 1/17 at dinnertime.  1/2  life is 5 to 9 hours.     PLAN:     *  EGD, ? Today. If not today then can eat today and will add case for tomorrow.  *  Will switch to BID, oral PPI once current PPI drip finishes.    Azucena Freed  09/15/2014, 11:36 AM Pager: [phone]  GI ATTENDING  History, laboratories, x-rays, prior colonoscopy reports reviewed. Patient personally seen and examined. Significant other in room. Agree with H&P as above. He presents with what appears to be upper GI bleed associated with dizziness and interval drop in hemoglobin. Currently stable. Xeralto held since Sunday. Plan upper endoscopy tomorrow. If negative, could consider capsule endoscopy.The nature of the procedure, as well as the risks, benefits, and alternatives were carefully and  thoroughly reviewed with the patient. Ample time for discussion and questions allowed. The patient understood, was satisfied, and agreed to proceed.  Docia Chuck. Geri Seminole., M.D. [facility] Division of Gastroenterology.

## 2014-09-16 ENCOUNTER — Inpatient Hospital Stay (HOSPITAL_COMMUNITY): Payer: Medicare Other | Admitting: Anesthesiology

## 2014-09-16 ENCOUNTER — Encounter (HOSPITAL_COMMUNITY)
Admission: EM | Disposition: A | Discharge status: Home / Self Care | Payer: Self-pay | Source: Home / Self Care | Attending: Internal Medicine

## 2014-09-16 ENCOUNTER — Encounter (HOSPITAL_COMMUNITY): Payer: Self-pay | Admitting: *Deleted

## 2014-09-16 ENCOUNTER — Ambulatory Visit: Payer: Medicare Other | Admitting: Nurse Practitioner

## 2014-09-16 DIAGNOSIS — K921 Melena: Secondary | ICD-10-CM | POA: Insufficient documentation

## 2014-09-16 DIAGNOSIS — K31819 Angiodysplasia of stomach and duodenum without bleeding: Secondary | ICD-10-CM | POA: Insufficient documentation

## 2014-09-16 HISTORY — PX: ESOPHAGOGASTRODUODENOSCOPY: SHX5428

## 2014-09-16 LAB — BASIC METABOLIC PANEL
Anion gap: 8 (ref 5–15)
BUN: 11 mg/dL (ref 6–23)
CALCIUM: 8.3 mg/dL — AB (ref 8.4–10.5)
CO2: 26 mmol/L (ref 19–32)
Chloride: 106 mEq/L (ref 96–112)
Creatinine, Ser: 0.92 mg/dL (ref 0.50–1.35)
GFR, EST AFRICAN AMERICAN: 86 mL/min — AB (ref >90)
GFR, EST NON AFRICAN AMERICAN: 74 mL/min — AB (ref >90)
Glucose, Bld: 97 mg/dL (ref 70–99)
POTASSIUM: 3.4 mmol/L — AB (ref 3.5–5.1)
SODIUM: 140 mmol/L (ref 135–145)

## 2014-09-16 LAB — CBC WITH DIFFERENTIAL/PLATELET
BASOS ABS: 0.1 10*3/uL (ref 0.0–0.1)
Basophils Relative: 1 % (ref 0–1)
Eosinophils Absolute: 0.2 10*3/uL (ref 0.0–0.7)
Eosinophils Relative: 4 % (ref 0–5)
HCT: 29.8 % — ABNORMAL LOW (ref 39.0–52.0)
HEMOGLOBIN: 9.7 g/dL — AB (ref 13.0–17.0)
Lymphocytes Relative: 23 % (ref 12–46)
Lymphs Abs: 1.4 10*3/uL (ref 0.7–4.0)
MCH: 29.4 pg (ref 26.0–34.0)
MCHC: 32.6 g/dL (ref 30.0–36.0)
MCV: 90.3 fL (ref 78.0–100.0)
MONOS PCT: 7 % (ref 3–12)
Monocytes Absolute: 0.4 10*3/uL (ref 0.1–1.0)
NEUTROS ABS: 3.8 10*3/uL (ref 1.7–7.7)
NEUTROS PCT: 65 % (ref 43–77)
PLATELETS: 205 10*3/uL (ref 150–400)
RBC: 3.3 MIL/uL — AB (ref 4.22–5.81)
RDW: 13.2 % (ref 11.5–15.5)
WBC: 5.8 10*3/uL (ref 4.0–10.5)

## 2014-09-16 SURGERY — EGD (ESOPHAGOGASTRODUODENOSCOPY)
Anesthesia: Monitor Anesthesia Care

## 2014-09-16 MED ORDER — PROPOFOL INFUSION 10 MG/ML OPTIME
INTRAVENOUS | Status: DC | PRN
  Administered 2014-09-16: 100 ug/kg/min via INTRAVENOUS

## 2014-09-16 MED ORDER — LIDOCAINE HCL (CARDIAC) 20 MG/ML IV SOLN
INTRAVENOUS | Status: DC | PRN
  Administered 2014-09-16: 80 mg via INTRAVENOUS

## 2014-09-16 MED ORDER — PROPOFOL 10 MG/ML IV BOLUS
INTRAVENOUS | Status: DC | PRN
  Administered 2014-09-16: 60 mg via INTRAVENOUS

## 2014-09-16 MED ORDER — SODIUM CHLORIDE 0.9 % IV SOLN
INTRAVENOUS | Status: DC
  Administered 2014-09-16 (×2): via INTRAVENOUS

## 2014-09-16 MED ORDER — ONDANSETRON HCL 4 MG/2ML IJ SOLN: 4.0000 mg | Freq: Once | INTRAMUSCULAR | Status: AC | PRN

## 2014-09-16 MED ORDER — POTASSIUM CHLORIDE CRYS ER 20 MEQ PO TBCR
40.0000 meq | EXTENDED_RELEASE_TABLET | Freq: Once | ORAL | Status: AC
  Administered 2014-09-16: 40 meq via ORAL
  Filled 2014-09-16: qty 2

## 2014-09-16 MED ORDER — LACTATED RINGERS IV SOLN
INTRAVENOUS | Status: DC | PRN
  Administered 2014-09-16: 10:00:00 via INTRAVENOUS

## 2014-09-16 NOTE — Interval H&P Note (Signed)
History and Physical Interval Note:  09/16/2014 10:35 AM  Jason [name]  has presented today for surgery, with the diagnosis of dark stools.  rule out GI bleed.  The various methods of treatment have been discussed with the patient and family. After consideration of risks, benefits and other options for treatment, the patient has consented to  Procedure(s): ESOPHAGOGASTRODUODENOSCOPY (EGD) (N/A) as a surgical intervention .  The patient's history has been reviewed, patient examined, no change in status, stable for surgery.  I have reviewed the patient's chart and labs.  Questions were answered to the patient's satisfaction.     [name]

## 2014-09-16 NOTE — Op Note (Signed)
[facility] [place], [id number]   ENDOSCOPY PROCEDURE REPORT  PATIENT: Jason, [name]  MR#: [id number] BIRTHDATE: Oct 01, 1925 , 51  yrs. old GENDER: male ENDOSCOPIST: Eustace Quail, MD REFERRED BY:  [name], M.D. PROCEDURE DATE:  09/16/2014 PROCEDURE:  EGD w/ control of bleeding ASA CLASS:     Class III INDICATIONS:  melena. MEDICATIONS: Monitored anesthesia care and Per Anesthesia TOPICAL ANESTHETIC: Cetacaine Spray  DESCRIPTION OF PROCEDURE: After the risks benefits and alternatives of the procedure were thoroughly explained, informed consent was obtained.  The PENTAX GASTOROSCOPE [id number] endoscope was introduced through the mouth and advanced to the second portion of the duodenum , Without limitations.  The instrument was slowly withdrawn as the mucosa was fully examined.  Exam: The esophagus was normal with a noninflamed irregular Z line. The stomach was remarkable for a 2 mm proximal AVM.  Nonbleeding. The remainder of the stomach was normal.  The duodenal bulb and post bulbar duodenum to the second portion were normal.  No blood in the upper gut.  THERAPY: The end-firing APC probe was used to obliterate the gastric AVM without inducing bleeding.  Retroflexed views revealed a hiatal hernia.     The scope was then withdrawn from the patient and the procedure completed.  COMPLICATIONS: There were no immediate complications.  ENDOSCOPIC IMPRESSION: 1. Proximal gastric AVM, nonbleeding, status post obliteration with APC 2. Otherwise unremarkable EGD to the second portion of the duodenum without bleeding  RECOMMENDATIONS: 1. Prilosec OTC 20 mg daily for 2 weeks then resume ranitidine 2. Over-the-counter iron supplement (65 mg of elemental iron) once daily indefinitely 3. Okay to resume anticoagulation, if felt to be clinically beneficial, the first of next week. 4. Resume diet 5. Okay to discharge home in a.m. if stable  overnight 6. GI will sign off, but available if needed. Thank you  REPEAT EXAM:  eSigned:  Eustace Quail, MD 09/16/2014 11:14 AM    YI:RSWN Perini, MD and The Patient

## 2014-09-16 NOTE — Transfer of Care (Signed)
Immediate Anesthesia Transfer of Care Note  Patient: Jason [name]  Procedure(s) Performed: Procedure(s): ESOPHAGOGASTRODUODENOSCOPY (EGD) (N/A)  Patient Location: PACU and Endoscopy Unit  Anesthesia Type:MAC  Level of Consciousness: awake and sedated  Airway & Oxygen Therapy: Patient Spontanous Breathing and Patient connected to nasal cannula oxygen  Post-op Assessment: Report given to PACU RN, Post -op Vital signs reviewed and stable and Patient moving all extremities  Post vital signs: Reviewed and stable  Complications: No apparent anesthesia complications

## 2014-09-16 NOTE — Progress Notes (Signed)
PROGRESS NOTE    Jason [name] NLG:[id number] DOB: 1926-05-28 DOA: 09/14/2014 PCP: Jerlyn Ly, MD  Primary Cardiologist: Dr. Peter Martinique  HPI/Brief narrative 81 y.o. year old male with significant past medical history of CAD s/p CABG, aortic stenosis s/p tissue valve replacement, atrial fibrillation recently started on xarelto, presented with one-week history of progressive weakness, dizziness, dark stools, seen by PCP on 1/18 and was nearly syncopal, Hemoccult positive, hemoglobin 11 (baseline 13) and admitted for further evaluation. Denied chest pain or dyspnea. No recent NSAID use.Presented to ER T 98.2, HR 80s-100s, resp 10s, BP 130s-150s, satting 94% on RA. WNC 11.1, hgb 10.8, Cr 1.03. Admitted for evaluation of upper GI bleed.   Assessment/Plan:  1. Upper GI bleed, likely upper: In the context of Xarelto. Patient undergoing EGD today, found to have proximal gastric AVM. GI recommending monitoring overnight, discharge in AM is stable. I spoke to patient regarding restarting oral anticoagulation in 1 week however he prefers to discuss this with his cardiologist.  2. Acute post hemorrhagic anemia: Secondary to GI bleed. Hemoglobin has dropped from baseline of 13 and stabilized in the 10 g per DL range. AM labs showing Hg of 9.7 3. Dizziness/weakness/presyncope: Secondary to acute blood loss. Brief IV fluids. 4. A. fib with controlled ventricular rate: CHADsVASc score around 3. Anticoagulation held secondary to GI bleed. Continue metoprolol. 2-D echo 06/2014 with normal LV function, functioning aortic 12 and mild MR. Updated primary cardiologist 1/19. 5. H/o CAD s/p CABG, AS s/p tissue aortic valve : Asymptomatic and stable.Solitary minimally elevated troponin likely from demand ischemia. Subsequent troponin negative. 6. OSA: Continue nightly C Pap.   Code Status: Full Family Communication: Discussed with spouse at bedside. Disposition Plan: Anticipate discharge in the next 24  hours   Consultants:  Gastroenterology  Procedures:  None  Antibiotics:  None   Subjective: Denies further episodes of GI bleed. He was seen after his endoscopy  Objective: Filed Vitals:   09/16/14 1003 09/16/14 1105 09/16/14 1110 09/16/14 1120  BP: 161/77 123/66 130/83 130/70  Pulse: 96 94 93 95  Temp: 97.5 F (36.4 C) 98.2 F (36.8 C)    TempSrc: Oral     Resp: 14 22 25 20   Height:      Weight:      SpO2:  99% 98% 98%    Intake/Output Summary (Last 24 hours) at 09/16/14 1538 Last data filed at 09/16/14 1456  Gross per 24 hour  Intake 3190.83 ml  Output   1375 ml  Net 1815.83 ml   Filed Weights   09/14/14 2108 09/15/14 2035  Weight: 81.421 kg (179 lb 8 oz) 82.555 kg (182 lb)     Exam:  General exam: Pleasant elderly male lying comfortably supine in bed. Hard of hearing. Respiratory system: Clear. No increased work of breathing. Cardiovascular system: S1 & S2 heard, irregularly irregular. No JVD, murmurs, gallops, clicks or pedal edema. Telemetry: A. fib with ventricular rate 70-90, BBB morphology, occasional PVCs. Gastrointestinal system: Abdomen is nondistended, soft and nontender. Normal bowel sounds heard. Central nervous system: Alert and oriented. No focal neurological deficits. Extremities: Symmetric 5 x 5 power.   Data Reviewed: Basic Metabolic Panel:  Recent Labs Lab 09/14/14 1650 09/15/14 0119 09/16/14 0640  NA 140 140 140  K 3.8 3.9 3.4*  CL 104 106 106  CO2 26 27 26   GLUCOSE 115* 100* 97  BUN 9 9 11   CREATININE 1.03 1.00 0.92  CALCIUM 8.8 8.7 8.3*   Liver Function  Tests:  Recent Labs Lab 09/14/14 1650 09/15/14 0119  AST 20 21  ALT 15 13  ALKPHOS 49 44  BILITOT 0.7 0.6  PROT 7.3 6.6  ALBUMIN 3.9 3.5   No results for input(s): LIPASE, AMYLASE in the last 168 hours. No results for input(s): AMMONIA in the last 168 hours. CBC:  Recent Labs Lab 09/14/14 1650 09/14/14 1941 09/15/14 0714 09/15/14 1709 09/16/14 0640    WBC 11.1* 10.7* 7.9 7.3 5.8  NEUTROABS 9.5* 8.7* 5.5 4.6 3.8  HGB 10.8* 10.4* 10.0* 10.1* 9.7*  HCT 32.9* 31.9* 31.0* 30.7* 29.8*  MCV 90.4 90.1 91.2 90.8 90.3  PLT 215 206 199 208 205   Cardiac Enzymes:  Recent Labs Lab 09/14/14 1957 09/15/14 0119 09/15/14 0714  TROPONINI 0.03 0.05* <0.03   BNP (last 3 results) No results for input(s): PROBNP in the last 8760 hours. CBG: No results for input(s): GLUCAP in the last 168 hours.  No results found for this or any previous visit (from the past 240 hour(s)).       Studies: No results found.      Scheduled Meds: . acetaminophen  1,000 mg Oral QHS  . perphenazine  4 mg Oral QHS   And  . amitriptyline  25 mg Oral QHS  . brinzolamide  1 drop Both Eyes BID  . brinzolamide  1 drop Left Eye QPC lunch  . escitalopram  5 mg Oral Daily  . latanoprost  1 drop Both Eyes QHS  . metoprolol tartrate  25 mg Oral Daily  . pantoprazole  40 mg Oral BID  . rosuvastatin  5 mg Oral q1800  . sodium chloride  3 mL Intravenous Q12H   Continuous Infusions: . sodium chloride 20 mL/hr at 09/16/14 1337    Active Problems:   GIB (gastrointestinal bleeding)   Melena   Angiodysplasia of stomach and duodenum without bleeding    Time spent: 25 minutes    Kelvin Cellar, MD, FACP, FHM. [facility] Pager [phone]  If 7PM-7AM, please contact night-coverage [email or web address] Password [email or web address] 09/16/2014, 3:38 PM    LOS: 2 days

## 2014-09-16 NOTE — Anesthesia Postprocedure Evaluation (Signed)
  Anesthesia Post-op Note  Patient: Jason [name]  Procedure(s) Performed: Procedure(s): ESOPHAGOGASTRODUODENOSCOPY (EGD) (N/A)  Patient Location: PACU  Anesthesia Type:MAC  Level of Consciousness: awake, alert , oriented and patient cooperative  Airway and Oxygen Therapy: Patient Spontanous Breathing  Post-op Pain: none  Post-op Assessment: Post-op Vital signs reviewed, Patient's Cardiovascular Status Stable, Respiratory Function Stable, Patent Airway, No signs of Nausea or vomiting and Pain level controlled  Post-op Vital Signs: stable  Last Vitals:  Filed Vitals:   09/16/14 1120  BP: 130/70  Pulse: 95  Temp:   Resp: 20    Complications: No apparent anesthesia complications

## 2014-09-16 NOTE — Clinical Documentation Improvement (Signed)
  Query #1 "Atrial fibrillation" documented in current medical record.  Please clarify if the patient's atrial fibrillation is:   - Chronic, Permanent, Persistent  - Paroxsymal  - Unable to Clinically Determine  Query #2 A cause and effect relationship cannot be assumed and must be documented by the attending provider.  Please document if the patient's GI Bleed is 2ndary to:   - GIB 2/2 Gastric AVM  - Other cause of GIB  - Unable to Clinically Determine   Thank You, Erling Conte ,RN Clinical Documentation Specialist:  [phone] [facility]

## 2014-09-16 NOTE — H&P (View-Only) (Signed)
[facility] Consult: 11:36 AM 09/15/2014  LOS: 1 day    Referring Provider: Algis Liming  Primary Care Physician:  Jerlyn Ly, MD Primary Gastroenterologist:  Dr. Sharlett Iles    Reason for Consultation:  [name] , FOBT + stool   HPI: Jason [name] is a 81 y.o. male.  Hx CAD s/p CABG, s/p tissue aortic valve replacement, atrial fibrillation started Xarelto as of 06/2014. OSA, on CPAP at night.    Stools have been dark but formed and not tarry for about 2 weeks.  Had episode dizziness last week and go presyncopal, diaphoretic at Dr Perini's office yesterday.  FOBT +. Transported to admission at [facility] via ambulance from his office.  Hgb 10.8, today it is 10.  Hgb in November 2011 was 8.3 to 9.0.  Apparently, outpatient hemoglobins recently have been 13-14 range per patient. BUN not elevated.  Previous Colonoscopies (below) but never had EGD.  Has been advised that due to advanced age, will not need another colonoscopy.  Has some upper abdominal burning.  On daily Ranitidine for many years.  No ASA, no NSAIDs.     ENDOSCOPIC STUDIES: 2010 Colonoscopy  Dr Sharlett Iles INDICATIONS:  history of polyps, constipation  ENDOSCOPIC IMPRESSION: 1) Moderate diverticulosis in the sigmoid to descending 2) Sessile polyp in the rectum 3) Otherwise normal examination Pathology: COLON, RECTAL POLYP: TUBULAR ADENOMA. NO HIGH GRADE DYSPLASIA OR MALIGNANCY IDENTIFIED. Plan for 10/2013 follow up colonoscopy  10/2004 Colonoscopy For adenomatous polyp surveillance.  Abnormal examination:  211.3: Colon Polyps. (Unable to locate pathology report.)  562.10: Diverticulosis.  455.6: Hemorrhoids, Internal and External.     Past Medical History  Diagnosis Date  . CAD (coronary artery disease)   . Aortic stenosis   . Atrial  fibrillation   . Dyslipidemia   . Depression   . Hx of CABG   . Aortic valve prosthesis present   . Prostate ca     Past Surgical History  Procedure Laterality Date  . Coronary artery bypass graft  October 2011    LIMA to LAD  . Aortic valve replacement  October 2011    Magna Ease pericardial tissue valve #90mm  . Cataract extraction    . Corneal transplant    . Back surgery    . Tonsillectomy      Prior to Admission medications   Medication Sig Start Date End Date Taking? Authorizing Provider  acetaminophen (TYLENOL) 500 MG tablet Take 500 mg by mouth every 6 (six) hours as needed for moderate pain.    Yes Historical Provider, MD  brinzolamide (AZOPT) 1 % ophthalmic suspension Place 1 drop into both eyes See admin instructions. TAKES 1 DROP BOTH EYES TWICE DAILY, AND THEN TAKES LEFT EYE ONLY AT LUNCH TIME.   Yes Historical Provider, MD  Calcium Carbonate-Vitamin D (CALCIUM-VITAMIN D) 500-200 MG-UNIT per tablet Take 1 tablet by mouth daily.   Yes Historical Provider, MD  Cholecalciferol 1000 UNITS capsule Take 1,000 Units by mouth daily.    Yes Historical Provider, MD  escitalopram (LEXAPRO) 10 MG tablet Take 5 mg by  mouth daily.    Yes Historical Provider, MD  fluticasone (FLONASE) 50 MCG/ACT nasal spray Place 1 spray into both nostrils as needed for allergies or rhinitis.   Yes Historical Provider, MD  metoprolol tartrate (LOPRESSOR) 25 MG tablet TAKE 1 TABLET BY MOUTH EVERY DAY   Yes [name], MD  Misc Natural Products (GLUCOS-CHONDROIT-MSM COMPLEX) TABS Take 1 tablet by mouth daily.   Yes Historical Provider, MD  Multiple Vitamin (MULTIVITAMIN WITH MINERALS) TABS tablet Take 1 tablet by mouth daily.   Yes Historical Provider, MD  Multiple Vitamins-Minerals (ICAPS MV) TABS Take 1 tablet by mouth 2 (two) times daily.   Yes Historical Provider, MD  omega-3 acid ethyl esters (LOVAZA) 1 G capsule Take 1 g by mouth daily.    Yes Historical Provider, MD  perphenazine-amitriptyline  (ETRAFON/TRIAVIL) 4-25 MG TABS Take 1 tablet by mouth at bedtime.   Yes Historical Provider, MD  ranitidine (ZANTAC) 150 MG tablet Take 150 mg by mouth daily.   Yes Historical Provider, MD  rosuvastatin (CRESTOR) 5 MG tablet Take 5 mg by mouth daily at 6 PM.   Yes Historical Provider, MD  senna-docusate (SENOKOT-S) 8.6-50 MG per tablet Take 1 tablet by mouth at bedtime.    Yes Historical Provider, MD  TRAVATAN Z 0.004 % SOLN ophthalmic solution Place 1 drop into both eyes at bedtime. 07/14/14  Yes Historical Provider, MD  vitamin B-12 (CYANOCOBALAMIN) 1000 MCG tablet Take 1,000 mcg by mouth daily.   Yes Historical Provider, MD  rivaroxaban (XARELTO) 20 MG TABS tablet Take 1 tablet (20 mg total) by mouth daily with supper. Patient not taking: Reported on 09/14/2014 07/15/14   [name], MD    Scheduled Meds: . acetaminophen  1,000 mg Oral QHS  . perphenazine  4 mg Oral QHS   And  . amitriptyline  25 mg Oral QHS  . brinzolamide  1 drop Both Eyes BID  . brinzolamide  1 drop Left Eye QPC lunch  . escitalopram  5 mg Oral Daily  . latanoprost  1 drop Both Eyes QHS  . metoprolol tartrate  25 mg Oral Daily  . rosuvastatin  5 mg Oral q1800  . sodium chloride  3 mL Intravenous Q12H   Infusions: . sodium chloride 100 mL/hr at 09/15/14 0609  . pantoprozole (PROTONIX) infusion 8 mg/hr (09/15/14 0717)   PRN Meds: acetaminophen, menthol-cetylpyridinium, ondansetron **OR** ondansetron (ZOFRAN) IV   Allergies as of 09/14/2014  . (No Known Allergies)    Family History  Problem Relation Age of Onset  . Heart attack Father     History   Social History  . Marital Status: Married    Spouse Name: N/A    Number of Children: N/A  . Years of Education: N/A   Occupational History  . Not on file.   Social History Main Topics  . Smoking status: Former Smoker -- 1.00 packs/day for 40 years    Types: Cigarettes    Quit date: 11/16/1990  . Smokeless tobacco: Never Used  . Alcohol Use: 1.2  oz/week    2 Glasses of wine per week  . Drug Use: No  . Sexual Activity: Not on file   Other Topics Concern  . Not on file   Social History Narrative    REVIEW OF SYSTEMS: Constitutional:  Generally active.  Weight loss of 8# in last 3 months ENT:  No nose bleeds Pulm:  No SOB or cough CV:  No palpitations, no LE edema.  GU:  No hematuria, no frequency GI:  Per HPI.  No dysphagia.  Norm is 2 to 3 BMs daily.  Heme:  No unusual bleeding or excessive bruising   Transfusions:  None ever Neuro:  No headaches, no peripheral tingling or numbness Derm:  No itching, no rash or sores.  Endocrine:  No sweats or chills.  No polyuria or dysuria Immunization:  Not queried.    PHYSICAL EXAM: Vital signs in last 24 hours: Filed Vitals:   09/15/14 0957  BP: 132/73  Pulse: 89  Temp: 98.2 F (36.8 C)  Resp: 18   Wt Readings from Last 3 Encounters:  09/14/14 179 lb 8 oz (81.421 kg)  07/15/14 181 lb 9.6 oz (82.373 kg)  01/12/14 190 lb (86.183 kg)    General: pleasant, looks well. comfortable Head:  No swelling, asymmetry or signs trauma  Eyes:  No conj pallor Ears:  Not HOH  Nose:  No discharge Mouth:  Clear, moist oral MM.  Tongue midline Neck:  No JVD, no masses, no TMG Lungs:  Clear bil.  Heart: no audible valve snap.  Irreg, irreg with rate controlled.  Abdomen:  Soft, NT, ND.  No masses or organomegaly.  No bruits. .   Rectal: deferred   Musc/Skeltl: no joint deformity or erythema.  No contractures Extremities:  No CCE  Neurologic:  Pleasant, oriented x 3.  No tremor or limb weakness.  Skin:  No telangectasia.  Tattoos:  none Nodes:  No cervical adenopathy.   Psych:  Cooperative, relaxed, in good spirits.   Intake/Output from previous day:   Intake/Output this shift: Total I/O In: 3 [I.V.:3] Out: 325 [Urine:325]  LAB RESULTS:  Recent Labs  09/14/14 1650 09/14/14 1941 09/15/14 0714  WBC 11.1* 10.7* 7.9  HGB 10.8* 10.4* 10.0*  HCT 32.9* 31.9* 31.0*  PLT  215 206 199  MCV                                          91  BMET Lab Results  Component Value Date   NA 140 09/15/2014   NA 140 09/14/2014   NA 139 01/03/2012   K 3.9 09/15/2014   K 3.8 09/14/2014   K 4.0 01/03/2012   CL 106 09/15/2014   CL 104 09/14/2014   CL 103 01/03/2012   CO2 27 09/15/2014   CO2 26 09/14/2014   CO2 28 01/03/2012   GLUCOSE 100* 09/15/2014   GLUCOSE 115* 09/14/2014   GLUCOSE 101* 01/03/2012   BUN 9 09/15/2014   BUN 9 09/14/2014   BUN 16 01/03/2012   CREATININE 1.00 09/15/2014   CREATININE 1.03 09/14/2014   CREATININE 1.0 01/03/2012   CALCIUM 8.7 09/15/2014   CALCIUM 8.8 09/14/2014   CALCIUM 9.1 01/03/2012   LFT  Recent Labs  09/14/14 1650 09/15/14 0119  PROT 7.3 6.6  ALBUMIN 3.9 3.5  AST 20 21  ALT 15 13  ALKPHOS 49 44  BILITOT 0.7 0.6   PT/INR Lab Results  Component Value Date   INR 1.35 09/14/2014   INR 2.70* 07/19/2010   INR 3.10* 07/18/2010    RADIOLOGY STUDIES: No results found.    IMPRESSION:   *  Dark, FOBT + stools with symptomatic anemia.   Has not required transfusion thus far. BUN is not elevated  *  A fib.  Xarelto added 06/2014, last dose was Sunday 1/17 at dinnertime.  1/2  life is 5 to 9 hours.     PLAN:     *  EGD, ? Today. If not today then can eat today and will add case for tomorrow.  *  Will switch to BID, oral PPI once current PPI drip finishes.    Azucena Freed  09/15/2014, 11:36 AM Pager: [phone]  GI ATTENDING  History, laboratories, x-rays, prior colonoscopy reports reviewed. Patient personally seen and examined. Significant other in room. Agree with H&P as above. He presents with what appears to be upper GI bleed associated with dizziness and interval drop in hemoglobin. Currently stable. Xeralto held since Sunday. Plan upper endoscopy tomorrow. If negative, could consider capsule endoscopy.The nature of the procedure, as well as the risks, benefits, and alternatives were carefully and  thoroughly reviewed with the patient. Ample time for discussion and questions allowed. The patient understood, was satisfied, and agreed to proceed.  Docia Chuck. Geri Seminole., M.D. [facility] Division of Gastroenterology.

## 2014-09-16 NOTE — Anesthesia Preprocedure Evaluation (Addendum)
Anesthesia Evaluation  Patient identified by MRN, date of birth, ID band Patient awake    Reviewed: Allergy & Precautions, NPO status   Airway        Dental   Pulmonary sleep apnea , COPDformer smoker,          Cardiovascular + CAD + dysrhythmias Atrial Fibrillation + Valvular Problems/Murmurs AS     Neuro/Psych Anxiety    GI/Hepatic   Endo/Other    Renal/GU      Musculoskeletal   Abdominal   Peds  Hematology   Anesthesia Other Findings   Reproductive/Obstetrics                            Anesthesia Physical Anesthesia Plan  ASA: III  Anesthesia Plan: MAC   Post-op Pain Management:    Induction: Intravenous  Airway Management Planned: Mask  Additional Equipment:   Intra-op Plan:   Post-operative Plan:   Informed Consent: I have reviewed the patients History and Physical, chart, labs and discussed the procedure including the risks, benefits and alternatives for the proposed anesthesia with the patient or authorized representative who has indicated his/her understanding and acceptance.     Plan Discussed with: Anesthesiologist, Surgeon and CRNA  Anesthesia Plan Comments:         Anesthesia Quick Evaluation

## 2014-09-17 ENCOUNTER — Encounter (HOSPITAL_COMMUNITY): Payer: Self-pay | Admitting: Internal Medicine

## 2014-09-17 ENCOUNTER — Ambulatory Visit: Payer: Medicare Other | Admitting: Cardiology

## 2014-09-17 DIAGNOSIS — I10 Essential (primary) hypertension: Secondary | ICD-10-CM

## 2014-09-17 LAB — CBC
HCT: 32.7 % — ABNORMAL LOW (ref 39.0–52.0)
HEMOGLOBIN: 10.5 g/dL — AB (ref 13.0–17.0)
MCH: 29.7 pg (ref 26.0–34.0)
MCHC: 32.1 g/dL (ref 30.0–36.0)
MCV: 92.6 fL (ref 78.0–100.0)
Platelets: 223 10*3/uL (ref 150–400)
RBC: 3.53 MIL/uL — ABNORMAL LOW (ref 4.22–5.81)
RDW: 13.2 % (ref 11.5–15.5)
WBC: 6.8 10*3/uL (ref 4.0–10.5)

## 2014-09-17 MED ORDER — OMEPRAZOLE 40 MG PO CPDR: 40.0000 mg | DELAYED_RELEASE_CAPSULE | Freq: Every day | ORAL | Status: DC

## 2014-09-17 NOTE — Discharge Summary (Signed)
Physician Discharge Summary  Jason [name] VVO:[id number] DOB: 1925-10-17 DOA: 09/14/2014  PCP: Jerlyn Ly, MD  Admit date: 09/14/2014 Discharge date: 09/17/2014  Time spent: 35 minutes  Recommendations for Outpatient Follow-up:  1. Please follow up with CBC on hospital follow up 2. With regard to anticoagulation he expresses wishes to discuss risks and benefits of resuming anticoagulation further with his cardiologist. Has cardiology appointment next week.    Discharge Diagnoses:  Active Problems:   GIB (gastrointestinal bleeding)   Melena   Angiodysplasia of stomach and duodenum without bleeding GI Bleed secondary to AVM  Discharge Condition: Stable  Diet recommendation: Heart Healthy  Filed Weights   09/14/14 2108 09/15/14 2035 09/16/14 2046  Weight: 81.421 kg (179 lb 8 oz) 82.555 kg (182 lb) 82.645 kg (182 lb 3.2 oz)    History of present illness:  History of Present Illness:This is a 81 y.o. year old male with significant past medical history of CAD s/p CABG, aortic stenosis s/p tissue valve replacement, atrial fibrillation recently started on xarelto presenting with GIB. Pt states that he had a cardiologist appt approx 1 month ago when during visit, he was noted to be in afib. Pt was started on xarelto at this point. Pt states that over the past 2 weeks, he has had black/maroon colored stools as well as weakness, dizziness, and decreased appetite. Pt had progressively worsening sxs and went to see his PCP today. Pt was noted to be hemoccult positive in the office w/ noted hgb 11. Pt states that his baseline hgb 13. Was noted to be mildly weak in office and redirected to ER. Pt denies any CP, SOB, diaphoresis, nausea. Denies any recent NSAID use.  Noted colonoscopy in 2010 w/ diverticular disease and sessile polyp in rectum Sharlett Iles) Presented to ER T 98.2, HR 80s-100s, resp 10s, BP 130s-150s, satting 94% on RA. WNC 11.1, hgb 10.8, Cr 1.03. EDP Jeneen Rinks spoke w/ Dr. Fuller Plan  who will see pt in am.   Hospital Course:  81 y.o. year old male with significant past medical history of CAD s/p CABG, aortic stenosis s/p tissue valve replacement, atrial fibrillation recently started on xarelto, presented with one-week history of progressive weakness, dizziness, dark stools, seen by PCP on 1/18 and was nearly syncopal, Hemoccult positive, hemoglobin 11 (baseline 13) and admitted for further evaluation. Denied chest pain or dyspnea. No recent NSAID use.Presented to ER T 98.2, HR 80s-100s, resp 10s, BP 130s-150s, satting 94% on RA. WNC 11.1, hgb 10.8, Cr 1.03. Admitted for evaluation of upper GI bleed. 1. Upper GI bleed secondary to AVM: In the context of Xarelto. Patient undergoing EGD on 09/17/2014, found to have proximal gastric AVM. I spoke to patient regarding restarting oral anticoagulation in 1 week however he prefers to discuss this with his cardiologist.  2. Acute post hemorrhagic anemia: Secondary to GI bleed. Hemoglobin has dropped from baseline of 13 and stabilized in the 10 g per DL range. AM labs showing Hg of 10.4 3. Dizziness/weakness/presyncope: Secondary to acute blood loss. Brief IV fluids. 4. A. fib with controlled ventricular rate: CHADsVASc score around 3. Anticoagulation held secondary to GI bleed. Continue metoprolol. 2-D echo 06/2014 with normal LV function, functioning aortic 12 and mild MR. 5. H/o CAD s/p CABG, AS s/p tissue aortic valve : Asymptomatic and stable.Solitary minimally elevated troponin likely from demand ischemia. Subsequent troponin negative. 6. OSA: Continue nightly C Pap.  Procedures:  EGD performed on 09/17/2014  Consultations:  GI  Discharge Exam: Filed Vitals:  09/17/14 1000  BP: 155/70  Pulse: 20  Temp: 98.4 F (36.9 C)  Resp: 18    General: No acute distress, awake and alert, following commands Cardiovascular: Regular rate and rhythm, normal S1S2 Respiratory: Normal inspiratory effort Abdomen: Soft nontender  nondistended  Discharge Instructions   Discharge Instructions    Call MD for:  difficulty breathing, headache or visual disturbances    Complete by:  As directed      Call MD for:  extreme fatigue    Complete by:  As directed      Call MD for:  hives    Complete by:  As directed      Call MD for:  persistant dizziness or light-headedness    Complete by:  As directed      Call MD for:  persistant nausea and vomiting    Complete by:  As directed      Call MD for:  redness, tenderness, or signs of infection (pain, swelling, redness, odor or green/yellow discharge around incision site)    Complete by:  As directed      Call MD for:  severe uncontrolled pain    Complete by:  As directed      Call MD for:  temperature >100.4    Complete by:  As directed      Diet - low sodium heart healthy    Complete by:  As directed      Increase activity slowly    Complete by:  As directed           Current Discharge Medication List    START taking these medications   Details  omeprazole (PRILOSEC) 40 MG capsule Take 1 capsule (40 mg total) by mouth daily. Qty: 30 capsule, Refills: 1      CONTINUE these medications which have NOT CHANGED   Details  acetaminophen (TYLENOL) 500 MG tablet Take 500 mg by mouth every 6 (six) hours as needed for moderate pain.     brinzolamide (AZOPT) 1 % ophthalmic suspension Place 1 drop into both eyes See admin instructions. TAKES 1 DROP BOTH EYES TWICE DAILY, AND THEN TAKES LEFT EYE ONLY AT LUNCH TIME.    Calcium Carbonate-Vitamin D (CALCIUM-VITAMIN D) 500-200 MG-UNIT per tablet Take 1 tablet by mouth daily.    Cholecalciferol 1000 UNITS capsule Take 1,000 Units by mouth daily.     escitalopram (LEXAPRO) 10 MG tablet Take 5 mg by mouth daily.     fluticasone (FLONASE) 50 MCG/ACT nasal spray Place 1 spray into both nostrils as needed for allergies or rhinitis.    metoprolol tartrate (LOPRESSOR) 25 MG tablet TAKE 1 TABLET BY MOUTH EVERY DAY Qty: 90  tablet, Refills: 3    Misc Natural Products (GLUCOS-CHONDROIT-MSM COMPLEX) TABS Take 1 tablet by mouth daily.    Multiple Vitamin (MULTIVITAMIN WITH MINERALS) TABS tablet Take 1 tablet by mouth daily.    omega-3 acid ethyl esters (LOVAZA) 1 G capsule Take 1 g by mouth daily.     perphenazine-amitriptyline (ETRAFON/TRIAVIL) 4-25 MG TABS Take 1 tablet by mouth at bedtime.    ranitidine (ZANTAC) 150 MG tablet Take 150 mg by mouth daily.    rosuvastatin (CRESTOR) 5 MG tablet Take 5 mg by mouth daily at 6 PM.    senna-docusate (SENOKOT-S) 8.6-50 MG per tablet Take 1 tablet by mouth at bedtime.     TRAVATAN Z 0.004 % SOLN ophthalmic solution Place 1 drop into both eyes at bedtime.    vitamin B-12 (CYANOCOBALAMIN) 1000  MCG tablet Take 1,000 mcg by mouth daily.      STOP taking these medications     rivaroxaban (XARELTO) 20 MG TABS tablet        No Known Allergies Follow-up Information    Follow up with PERINI,MARK A, MD In 2 weeks.   Specialty:  Internal Medicine   Contact information:   [place] [id number] [phone]        The results of significant diagnostics from this hospitalization (including imaging, microbiology, ancillary and laboratory) are listed below for reference.    Significant Diagnostic Studies: No results found.  Microbiology: No results found for this or any previous visit (from the past 240 hour(s)).   Labs: Basic Metabolic Panel:  Recent Labs Lab 09/14/14 1650 09/15/14 0119 09/16/14 0640  NA 140 140 140  K 3.8 3.9 3.4*  CL 104 106 106  CO2 26 27 26   GLUCOSE 115* 100* 97  BUN 9 9 11   CREATININE 1.03 1.00 0.92  CALCIUM 8.8 8.7 8.3*   Liver Function Tests:  Recent Labs Lab 09/14/14 1650 09/15/14 0119  AST 20 21  ALT 15 13  ALKPHOS 49 44  BILITOT 0.7 0.6  PROT 7.3 6.6  ALBUMIN 3.9 3.5   No results for input(s): LIPASE, AMYLASE in the last 168 hours. No results for input(s): AMMONIA in the last 168  hours. CBC:  Recent Labs Lab 09/14/14 1650 09/14/14 1941 09/15/14 0714 09/15/14 1709 09/16/14 0640 09/17/14 0534  WBC 11.1* 10.7* 7.9 7.3 5.8 6.8  NEUTROABS 9.5* 8.7* 5.5 4.6 3.8  --   HGB 10.8* 10.4* 10.0* 10.1* 9.7* 10.5*  HCT 32.9* 31.9* 31.0* 30.7* 29.8* 32.7*  MCV 90.4 90.1 91.2 90.8 90.3 92.6  PLT 215 206 199 208 205 223   Cardiac Enzymes:  Recent Labs Lab 09/14/14 1957 09/15/14 0119 09/15/14 0714  TROPONINI 0.03 0.05* <0.03   BNP: BNP (last 3 results) No results for input(s): PROBNP in the last 8760 hours. CBG: No results for input(s): GLUCAP in the last 168 hours.     SignedKelvin Cellar  [facility] 09/17/2014, 11:29 AM

## 2014-09-17 NOTE — Care Management Note (Signed)
CARE MANAGEMENT NOTE 09/17/2014  Patient:  Jason [name], Jason [name]   Account Number:  [id number]  Date Initiated:  09/17/2014  Documentation initiated by:  Lauri Purdum  Subjective/Objective Assessment:   CM following for progression and d/c planning.     Action/Plan:   09/17/2014 Met with pt and significant other re d/c needs, no needs identified. IM given.   Anticipated DC Date:  09/17/2014   Anticipated DC Plan:  HOME/SELF CARE         Choice offered to / List presented to:             Status of service:  Completed, signed off Medicare Important Message given?  YES (If response is "NO", the following Medicare IM given date fields will be blank) Date Medicare IM given:  09/17/2014 Medicare IM given by:  Shoshana Johal Date Additional Medicare IM given:   Additional Medicare IM given by:    Discharge Disposition:  HOME/SELF CARE  Per UR Regulation:    If discussed at Long Length of Stay Meetings, dates discussed:    Comments:

## 2014-09-23 ENCOUNTER — Encounter: Payer: Self-pay | Admitting: Cardiology

## 2014-09-23 ENCOUNTER — Ambulatory Visit (INDEPENDENT_AMBULATORY_CARE_PROVIDER_SITE_OTHER): Payer: Medicare Other | Admitting: Cardiology

## 2014-09-23 VITALS — BP 110/64 | HR 86 | Ht 71.0 in | Wt 182.4 lb

## 2014-09-23 DIAGNOSIS — K31811 Angiodysplasia of stomach and duodenum with bleeding: Secondary | ICD-10-CM

## 2014-09-23 DIAGNOSIS — E785 Hyperlipidemia, unspecified: Secondary | ICD-10-CM

## 2014-09-23 DIAGNOSIS — I25810 Atherosclerosis of coronary artery bypass graft(s) without angina pectoris: Secondary | ICD-10-CM | POA: Diagnosis not present

## 2014-09-23 DIAGNOSIS — I4819 Other persistent atrial fibrillation: Secondary | ICD-10-CM

## 2014-09-23 DIAGNOSIS — I481 Persistent atrial fibrillation: Secondary | ICD-10-CM | POA: Diagnosis not present

## 2014-09-23 DIAGNOSIS — I359 Nonrheumatic aortic valve disorder, unspecified: Secondary | ICD-10-CM | POA: Diagnosis not present

## 2014-09-23 LAB — CBC WITH DIFFERENTIAL/PLATELET
Basophils Absolute: 0.1 10*3/uL (ref 0.0–0.1)
Basophils Relative: 2 % — ABNORMAL HIGH (ref 0–1)
EOS ABS: 0.2 10*3/uL (ref 0.0–0.7)
EOS PCT: 3 % (ref 0–5)
HCT: 33.9 % — ABNORMAL LOW (ref 39.0–52.0)
HEMOGLOBIN: 10.9 g/dL — AB (ref 13.0–17.0)
Lymphocytes Relative: 29 % (ref 12–46)
Lymphs Abs: 1.8 10*3/uL (ref 0.7–4.0)
MCH: 29 pg (ref 26.0–34.0)
MCHC: 32.2 g/dL (ref 30.0–36.0)
MCV: 90.2 fL (ref 78.0–100.0)
MONO ABS: 0.6 10*3/uL (ref 0.1–1.0)
MPV: 9.6 fL (ref 8.6–12.4)
Monocytes Relative: 10 % (ref 3–12)
Neutro Abs: 3.4 10*3/uL (ref 1.7–7.7)
Neutrophils Relative %: 56 % (ref 43–77)
Platelets: 274 10*3/uL (ref 150–400)
RBC: 3.76 MIL/uL — ABNORMAL LOW (ref 4.22–5.81)
RDW: 13.9 % (ref 11.5–15.5)
WBC: 6.1 10*3/uL (ref 4.0–10.5)

## 2014-09-23 NOTE — Progress Notes (Signed)
[place] Date of Birth: Apr 09, 1926   History of Present Illness: Jason [name] is seen today for follow up of atrial fibrillation. He is status post aortic valve replacement and coronary bypass surgery in October of 2011. He has a tissue valve prosthesis. He was seen in November with new onset atrial fibrillation. He was treated with rate control and anticoagulation with Xarelto. On January 18 he was admitted with a GIB. Xarelto was held. Hbg dropped to 9.7. he did not require transfusion. 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 Since then he still has some weakness. He is on iron. Xarelto was stopped. No chest pain or SOB.  He is  Planning to move to [facility] in next week.   Current Outpatient Prescriptions on File Prior to Visit  Medication Sig Dispense Refill  . acetaminophen (TYLENOL) 500 MG tablet Take 500 mg by mouth every 6 (six) hours as needed for moderate pain.     . brinzolamide (AZOPT) 1 % ophthalmic suspension Place 1 drop into both eyes See admin instructions. TAKES 1 DROP BOTH EYES TWICE DAILY, AND THEN TAKES LEFT EYE ONLY AT LUNCH TIME.    . Calcium Carbonate-Vitamin D (CALCIUM-VITAMIN D) 500-200 MG-UNIT per tablet Take 1 tablet by mouth daily.    . Cholecalciferol 1000 UNITS capsule Take 1,000 Units by mouth daily.     [name] escitalopram (LEXAPRO) 10 MG tablet Take 5 mg by mouth daily.     . fluticasone (FLONASE) 50 MCG/ACT nasal spray Place 1 spray into both nostrils as needed for allergies or rhinitis.    . metoprolol tartrate (LOPRESSOR) 25 MG tablet TAKE 1 TABLET BY MOUTH EVERY DAY 90 tablet 3  . Misc Natural Products (GLUCOS-CHONDROIT-MSM COMPLEX) TABS Take 1 tablet by mouth daily.    . Multiple Vitamin (MULTIVITAMIN WITH MINERALS) TABS tablet Take 1 tablet by mouth daily.    [name] omega-3 acid ethyl esters (LOVAZA) 1 G capsule Take 1 g by mouth daily.     [name] omeprazole (PRILOSEC) 40 MG capsule Take 1 capsule (40 mg total) by mouth  daily. 30 capsule 1  . perphenazine-amitriptyline (ETRAFON/TRIAVIL) 4-25 MG TABS Take 1 tablet by mouth at bedtime.    . ranitidine (ZANTAC) 150 MG tablet Take 150 mg by mouth daily.    . rosuvastatin (CRESTOR) 5 MG tablet Take 5 mg by mouth daily at 6 PM.    . senna-docusate (SENOKOT-S) 8.6-50 MG per tablet Take 1 tablet by mouth at bedtime.     . TRAVATAN Z 0.004 % SOLN ophthalmic solution Place 1 drop into both eyes at bedtime.    . vitamin B-12 (CYANOCOBALAMIN) 1000 MCG tablet Take 1,000 mcg by mouth daily.     No current facility-administered medications on file prior to visit.    No Known Allergies  Past Medical History  Diagnosis Date  . CAD (coronary artery disease)   . Aortic stenosis   . Atrial fibrillation   . Dyslipidemia   . Depression   . Hx of CABG   . Aortic valve prosthesis present   . Prostate ca   . Adenomatous polyp of colon 2010 and before 2006    Past Surgical History  Procedure Laterality Date  . Coronary artery bypass graft  October 2011    LIMA to LAD  . Aortic valve replacement  October 2011    Magna Ease pericardial tissue valve #31mm  . Cataract extraction    . Corneal  transplant    . Back surgery    . Tonsillectomy    . Esophagogastroduodenoscopy N/A 09/16/2014    Procedure: ESOPHAGOGASTRODUODENOSCOPY (EGD);  Surgeon: [name], MD;  Location: [facility] ENDOSCOPY;  Service: Endoscopy;  Laterality: N/A;    History  Smoking status  . Former Smoker -- 1.00 packs/day for 40 years  . Types: Cigarettes  . Quit date: 11/16/1990  Smokeless tobacco  . Never Used    History  Alcohol Use  . 1.2 oz/week  . 2 Glasses of wine per week    Family History  Problem Relation Age of Onset  . Heart attack Father     Review of Systems: As noted in history of present illness. Not history of TIA or CVA. No bleeding problems. He did have Afib following open heart surgery that resolved. All other systems were reviewed and are negative.  Physical Exam: BP  110/64 mmHg  Pulse 86  Ht 5\' 11"  (1.803 m)  Wt 182 lb 7 oz (82.753 kg)  BMI 25.46 kg/m2 Patient is very pleasant and in no acute distress. HEENT is unremarkable. Normocephalic/atraumatic. PERRL. Sclera are nonicteric. Neck is supple. No masses. No JVD. Lungs are clear. Cardiac exam shows an irregularly irregular rate and rhythm.  He has a soft 1-2/6 aortic outflow murmur.   Abdomen is soft. Extremities are without edema. Gait and ROM are intact. No gross neurologic deficits noted.  LABORATORY DATA:   Echo: 07/17/14          *[facility] [place], [place], [place] [address]              [phone]  ------------------------------------------------------------------- Transthoracic Echocardiography  Patient:  Jason, [name] MR #:    [id number] Study Date: 07/17/2014 Gender:   M Age:    81 Height:   180.3 cm Weight:   82.1 kg BSA:    2.04 m^2 Pt. Status: Room:  ATTENDING  Florena Kozma [place], M.D. ORDERING   Iyari Hagner [place], M.D. REFERRING  Keirstyn Aydt [place], M.D. PERFORMING  [facility], Outpatient SONOGRAPHER [facility], RDCS  cc:  ------------------------------------------------------------------- LV EF: 55% -  60%  ------------------------------------------------------------------- Indications:   Atrial Fibrillation (I48.1).  ------------------------------------------------------------------- History:  PMH:  Atrial fibrillation. Coronary artery disease. Risk factors: Aortic Valve Replacement, Prostate Cancer. Family history of coronary artery disease. Former tobacco use. Dyslipidemia.  ------------------------------------------------------------------- Study Conclusions  - Left ventricle: The cavity size was normal. Wall thickness was increased in a pattern of mild LVH. There was mild concentric hypertrophy. Systolic function was  normal. The estimated ejection fraction was in the range of 55% to 60%. Wall motion was normal; there were no regional wall motion abnormalities. - Ventricular septum: Septal motion showed paradox. - Aortic valve: A bioprosthesis was present and functioning normally. Valve area (VTI): 1.6 cm^2. Valve area (Vmax): 1.5 cm^2. Valve area (Vmean): 1.33 cm^2. - Mitral valve: Calcified, moderately to severely fibrotic annulus. Mildly thickened leaflets . The findings are consistent with trivial stenosis. There was mild regurgitation directed centrally. Valve area by continuity equation (using LVOT flow): 2.6 cm^2. - Left atrium: The atrium was moderately dilated. - Right atrium: The atrium was mildly dilated.       Assessment / Plan: 1. Status post tissue aortic valve replacement for severe aortic stenosis. Clinically doing well. Good valve function by Echo.  2. Coronary disease status post CABG with an LIMA graft  to the LAD in October of 2011. Currently no anginal symptoms.  3. Dyslipidemia well controlled on Crestor and fish oil.   4. PVCs. These are chronic.  5. Atrial fibrillation. New onset in November. Currently asymptomatic. Rate controlled on metoprolol. He has a [place] vasc score of 3 placing him at higher risk of CVA. Recent GIB on Xarelto. Will check CBC today. If Hgb improved would recommend resumption of anticoagulation since risk benefit ratio still favors anticoagulation. Pradaxa may be an option since there is a reversal agent now but it also has a higher risk of GIB compared to coumadin and its risk is higher in elderly patients. I would probably favor using coumadin with target INR 2-2.5.  6. GIB. Probably related to gastric AVM. At his age and with history of AV disease he is at higher risk of other AVMs. See #5.

## 2014-09-23 NOTE — Patient Instructions (Signed)
We will check your hemoglobin today.  I will call with results and we will determine anticoagulant therapy

## 2014-09-28 DIAGNOSIS — I480 Paroxysmal atrial fibrillation: Secondary | ICD-10-CM | POA: Diagnosis not present

## 2014-09-28 DIAGNOSIS — K922 Gastrointestinal hemorrhage, unspecified: Secondary | ICD-10-CM | POA: Diagnosis not present

## 2014-09-28 DIAGNOSIS — Z7901 Long term (current) use of anticoagulants: Secondary | ICD-10-CM | POA: Diagnosis not present

## 2014-09-30 ENCOUNTER — Other Ambulatory Visit (HOSPITAL_COMMUNITY): Payer: Self-pay

## 2014-10-01 ENCOUNTER — Ambulatory Visit (HOSPITAL_COMMUNITY)
Admission: RE | Admit: 2014-10-01 | Discharge: 2014-10-01 | Disposition: A | Discharge status: Home / Self Care | Payer: Medicare Other | Source: Ambulatory Visit | Attending: Internal Medicine | Admitting: Internal Medicine

## 2014-10-01 DIAGNOSIS — Z79899 Other long term (current) drug therapy: Secondary | ICD-10-CM | POA: Diagnosis not present

## 2014-10-01 DIAGNOSIS — D649 Anemia, unspecified: Secondary | ICD-10-CM | POA: Diagnosis not present

## 2014-10-01 DIAGNOSIS — Z7901 Long term (current) use of anticoagulants: Secondary | ICD-10-CM | POA: Diagnosis not present

## 2014-10-01 DIAGNOSIS — I480 Paroxysmal atrial fibrillation: Secondary | ICD-10-CM | POA: Diagnosis not present

## 2014-10-01 MED ORDER — SODIUM CHLORIDE 0.9 % IV SOLN
1020.0000 mg | Freq: Once | INTRAVENOUS | Status: AC
  Administered 2014-10-01: 1020 mg via INTRAVENOUS
  Filled 2014-10-01: qty 34

## 2014-10-01 NOTE — Discharge Instructions (Signed)

## 2014-10-07 DIAGNOSIS — Z7901 Long term (current) use of anticoagulants: Secondary | ICD-10-CM | POA: Diagnosis not present

## 2014-10-07 DIAGNOSIS — Z79899 Other long term (current) drug therapy: Secondary | ICD-10-CM | POA: Diagnosis not present

## 2014-10-07 DIAGNOSIS — I480 Paroxysmal atrial fibrillation: Secondary | ICD-10-CM | POA: Diagnosis not present

## 2014-10-07 DIAGNOSIS — D649 Anemia, unspecified: Secondary | ICD-10-CM | POA: Diagnosis not present

## 2014-10-17 DIAGNOSIS — I350 Nonrheumatic aortic (valve) stenosis: Secondary | ICD-10-CM | POA: Diagnosis not present

## 2014-10-17 DIAGNOSIS — I480 Paroxysmal atrial fibrillation: Secondary | ICD-10-CM | POA: Diagnosis not present

## 2014-10-17 DIAGNOSIS — Z6826 Body mass index (BMI) 26.0-26.9, adult: Secondary | ICD-10-CM | POA: Diagnosis not present

## 2014-10-17 DIAGNOSIS — K922 Gastrointestinal hemorrhage, unspecified: Secondary | ICD-10-CM | POA: Diagnosis not present

## 2014-10-23 DIAGNOSIS — C61 Malignant neoplasm of prostate: Secondary | ICD-10-CM | POA: Diagnosis not present

## 2014-10-26 DIAGNOSIS — H3530 Unspecified macular degeneration: Secondary | ICD-10-CM | POA: Diagnosis not present

## 2014-10-26 DIAGNOSIS — H4011X1 Primary open-angle glaucoma, mild stage: Secondary | ICD-10-CM | POA: Diagnosis not present

## 2014-10-26 DIAGNOSIS — H4011X2 Primary open-angle glaucoma, moderate stage: Secondary | ICD-10-CM | POA: Diagnosis not present

## 2014-10-29 DIAGNOSIS — D649 Anemia, unspecified: Secondary | ICD-10-CM | POA: Diagnosis not present

## 2014-10-29 DIAGNOSIS — Z7901 Long term (current) use of anticoagulants: Secondary | ICD-10-CM | POA: Diagnosis not present

## 2014-10-29 DIAGNOSIS — I480 Paroxysmal atrial fibrillation: Secondary | ICD-10-CM | POA: Diagnosis not present

## 2014-10-29 DIAGNOSIS — Z79899 Other long term (current) drug therapy: Secondary | ICD-10-CM | POA: Diagnosis not present

## 2014-10-30 ENCOUNTER — Other Ambulatory Visit (HOSPITAL_COMMUNITY): Payer: Self-pay | Admitting: Urology

## 2014-10-30 DIAGNOSIS — N390 Urinary tract infection, site not specified: Secondary | ICD-10-CM | POA: Diagnosis not present

## 2014-10-30 DIAGNOSIS — C61 Malignant neoplasm of prostate: Secondary | ICD-10-CM

## 2014-11-04 DIAGNOSIS — H4011X1 Primary open-angle glaucoma, mild stage: Secondary | ICD-10-CM | POA: Diagnosis not present

## 2014-11-04 DIAGNOSIS — H3530 Unspecified macular degeneration: Secondary | ICD-10-CM | POA: Diagnosis not present

## 2014-11-04 DIAGNOSIS — T85398D Other mechanical complication of other ocular prosthetic devices, implants and grafts, subsequent encounter: Secondary | ICD-10-CM | POA: Diagnosis not present

## 2014-11-04 DIAGNOSIS — H4011X2 Primary open-angle glaucoma, moderate stage: Secondary | ICD-10-CM | POA: Diagnosis not present

## 2014-11-06 ENCOUNTER — Encounter (HOSPITAL_COMMUNITY)
Admission: RE | Admit: 2014-11-06 | Discharge: 2014-11-06 | Disposition: A | Discharge status: Home / Self Care | Payer: Medicare Other | Source: Ambulatory Visit | Attending: Urology | Admitting: Urology

## 2014-11-06 ENCOUNTER — Ambulatory Visit (HOSPITAL_COMMUNITY)
Admission: RE | Admit: 2014-11-06 | Discharge: 2014-11-06 | Disposition: A | Discharge status: Home / Self Care | Payer: Medicare Other | Source: Ambulatory Visit | Attending: Urology | Admitting: Urology

## 2014-11-06 DIAGNOSIS — C61 Malignant neoplasm of prostate: Secondary | ICD-10-CM | POA: Insufficient documentation

## 2014-11-06 MED ORDER — TECHNETIUM TC 99M MEDRONATE IV KIT
27.0000 | PACK | Freq: Once | INTRAVENOUS | Status: AC | PRN
  Administered 2014-11-06: 27 via INTRAVENOUS

## 2014-11-26 DIAGNOSIS — Z79899 Other long term (current) drug therapy: Secondary | ICD-10-CM | POA: Diagnosis not present

## 2014-11-26 DIAGNOSIS — D649 Anemia, unspecified: Secondary | ICD-10-CM | POA: Diagnosis not present

## 2014-11-26 DIAGNOSIS — I493 Ventricular premature depolarization: Secondary | ICD-10-CM | POA: Diagnosis not present

## 2014-12-14 ENCOUNTER — Encounter: Payer: Self-pay | Admitting: Cardiology

## 2014-12-14 ENCOUNTER — Ambulatory Visit (INDEPENDENT_AMBULATORY_CARE_PROVIDER_SITE_OTHER): Payer: Medicare Other | Admitting: Cardiology

## 2014-12-14 VITALS — BP 110/64 | HR 78 | Ht 71.0 in | Wt 186.1 lb

## 2014-12-14 DIAGNOSIS — I2510 Atherosclerotic heart disease of native coronary artery without angina pectoris: Secondary | ICD-10-CM | POA: Diagnosis not present

## 2014-12-14 DIAGNOSIS — E785 Hyperlipidemia, unspecified: Secondary | ICD-10-CM

## 2014-12-14 DIAGNOSIS — I25810 Atherosclerosis of coronary artery bypass graft(s) without angina pectoris: Secondary | ICD-10-CM

## 2014-12-14 DIAGNOSIS — I482 Chronic atrial fibrillation, unspecified: Secondary | ICD-10-CM

## 2014-12-14 DIAGNOSIS — I359 Nonrheumatic aortic valve disorder, unspecified: Secondary | ICD-10-CM

## 2014-12-14 NOTE — Progress Notes (Signed)
[place] Date of Birth: December 14, 1925   History of Present Illness: Jason [name] is seen today for follow up of atrial fibrillation. He is status post aortic valve replacement and coronary bypass surgery in October of 2011. He has a tissue valve prosthesis. He was seen in November with new onset atrial fibrillation. He was treated with rate control and anticoagulation with Xarelto. On January 18 he was admitted with a GIB. Xarelto was held. Hbg dropped to 9.7. he did not require transfusion. 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 Since then he has done well. No recurrent bleeding.  Xarelto was stopped. He is now on Coumadin. INR therapeutic. No chest pain or SOB.    Current Outpatient Prescriptions on File Prior to Visit  Medication Sig Dispense Refill  . acetaminophen (TYLENOL) 500 MG tablet Take 500 mg by mouth every 6 (six) hours as needed for moderate pain.     . brinzolamide (AZOPT) 1 % ophthalmic suspension Place 1 drop into both eyes See admin instructions. TAKES 1 DROP BOTH EYES TWICE DAILY, AND THEN TAKES LEFT EYE ONLY AT LUNCH TIME.    . Calcium Carbonate-Vitamin D (CALCIUM-VITAMIN D) 500-200 MG-UNIT per tablet Take 1 tablet by mouth daily.    . Cholecalciferol 1000 UNITS capsule Take 1,000 Units by mouth daily.     . fluticasone (FLONASE) 50 MCG/ACT nasal spray Place 1 spray into both nostrils as needed for allergies or rhinitis.    . metoprolol tartrate (LOPRESSOR) 25 MG tablet TAKE 1 TABLET BY MOUTH EVERY DAY 90 tablet 3  . Multiple Vitamin (MULTIVITAMIN WITH MINERALS) TABS tablet Take 1 tablet by mouth daily.    [name] omega-3 acid ethyl esters (LOVAZA) 1 G capsule Take 1 g by mouth daily.     [name] perphenazine-amitriptyline (ETRAFON/TRIAVIL) 4-25 MG TABS Take 1 tablet by mouth at bedtime.    . ranitidine (ZANTAC) 150 MG tablet Take 150 mg by mouth daily.    . rosuvastatin (CRESTOR) 5 MG tablet Take 5 mg by mouth daily at 6 PM.    . senna-docusate  (SENOKOT-S) 8.6-50 MG per tablet Take 1 tablet by mouth at bedtime.     . TRAVATAN Z 0.004 % SOLN ophthalmic solution Place 1 drop into both eyes at bedtime.    . vitamin B-12 (CYANOCOBALAMIN) 1000 MCG tablet Take 1,000 mcg by mouth daily.    [name] escitalopram (LEXAPRO) 10 MG tablet Take 5 mg by mouth daily.      No current facility-administered medications on file prior to visit.    No Known Allergies  Past Medical History  Diagnosis Date  . CAD (coronary artery disease)   . Aortic stenosis   . Atrial fibrillation   . Dyslipidemia   . Depression   . Hx of CABG   . Aortic valve prosthesis present   . Prostate ca   . Adenomatous polyp of colon 2010 and before 2006    Past Surgical History  Procedure Laterality Date  . Coronary artery bypass graft  October 2011    LIMA to LAD  . Aortic valve replacement  October 2011    Magna Ease pericardial tissue valve #64mm  . Cataract extraction    . Corneal transplant    . Back surgery    . Tonsillectomy    . Esophagogastroduodenoscopy N/A 09/16/2014    Procedure: ESOPHAGOGASTRODUODENOSCOPY (EGD);  Surgeon: [name], MD;  Location: [facility] ENDOSCOPY;  Service: Endoscopy;  Laterality: N/A;  History  Smoking status  . Former Smoker -- 1.00 packs/day for 40 years  . Types: Cigarettes  . Quit date: 11/16/1990  Smokeless tobacco  . Never Used    History  Alcohol Use  . 1.2 oz/week  . 2 Glasses of wine per week    Family History  Problem Relation Age of Onset  . Heart attack Father     Review of Systems: As noted in history of present illness. s All other systems were reviewed and are negative.  Physical Exam: BP 110/64 mmHg  Pulse 78  Ht 5\' 11"  (1.803 m)  Wt 186 lb 2 oz (84.426 kg)  BMI 25.97 kg/m2 Patient is very pleasant and in no acute distress. HEENT is unremarkable. Normocephalic/atraumatic. PERRL. Sclera are nonicteric. Neck is supple. No masses. No JVD. Lungs are clear. Cardiac exam shows an irregularly irregular  rate and rhythm.  He has a soft 1-2/6 aortic outflow murmur.   Abdomen is soft. Extremities are without edema. Gait and ROM are intact. No gross neurologic deficits noted.  LABORATORY DATA:      Assessment / Plan: 1. Status post tissue aortic valve replacement for severe aortic stenosis. Clinically doing well. Good valve function by Echo.  2. Coronary disease status post CABG with an LIMA graft to the LAD in October of 2011. Currently no anginal symptoms.  3. Dyslipidemia well controlled on Crestor and fish oil.   4. PVCs. These are chronic.  5. Atrial fibrillation. New onset in November. Currently asymptomatic. Rate controlled on metoprolol. He has a [place] vasc score of 3 placing him at higher risk of CVA. Recent GIB on Xarelto. Now on coumadin without recurrent bleed. Target INR 2-2.5.  6. GIB. Probably related to gastric AVM. At his age and with history of AV disease he is at higher risk of other AVMs.

## 2014-12-14 NOTE — Patient Instructions (Signed)
Continue your current therapy  I will see you in 6 months.   

## 2014-12-28 DIAGNOSIS — D649 Anemia, unspecified: Secondary | ICD-10-CM | POA: Diagnosis not present

## 2014-12-28 DIAGNOSIS — I480 Paroxysmal atrial fibrillation: Secondary | ICD-10-CM | POA: Diagnosis not present

## 2014-12-28 DIAGNOSIS — C61 Malignant neoplasm of prostate: Secondary | ICD-10-CM | POA: Diagnosis not present

## 2014-12-28 DIAGNOSIS — R7301 Impaired fasting glucose: Secondary | ICD-10-CM | POA: Diagnosis not present

## 2014-12-28 DIAGNOSIS — Z6827 Body mass index (BMI) 27.0-27.9, adult: Secondary | ICD-10-CM | POA: Diagnosis not present

## 2014-12-28 DIAGNOSIS — Z1389 Encounter for screening for other disorder: Secondary | ICD-10-CM | POA: Diagnosis not present

## 2014-12-28 DIAGNOSIS — F329 Major depressive disorder, single episode, unspecified: Secondary | ICD-10-CM | POA: Diagnosis not present

## 2015-01-04 DIAGNOSIS — Z87891 Personal history of nicotine dependence: Secondary | ICD-10-CM | POA: Diagnosis not present

## 2015-01-04 DIAGNOSIS — T86840 Corneal transplant rejection: Secondary | ICD-10-CM | POA: Diagnosis not present

## 2015-01-04 DIAGNOSIS — H3530 Unspecified macular degeneration: Secondary | ICD-10-CM | POA: Diagnosis not present

## 2015-01-04 DIAGNOSIS — Z7901 Long term (current) use of anticoagulants: Secondary | ICD-10-CM | POA: Diagnosis not present

## 2015-01-04 DIAGNOSIS — H4011X2 Primary open-angle glaucoma, moderate stage: Secondary | ICD-10-CM | POA: Diagnosis not present

## 2015-01-04 DIAGNOSIS — Z961 Presence of intraocular lens: Secondary | ICD-10-CM | POA: Diagnosis not present

## 2015-01-04 DIAGNOSIS — I4891 Unspecified atrial fibrillation: Secondary | ICD-10-CM | POA: Diagnosis not present

## 2015-01-04 DIAGNOSIS — H409 Unspecified glaucoma: Secondary | ICD-10-CM | POA: Diagnosis not present

## 2015-01-04 DIAGNOSIS — Z947 Corneal transplant status: Secondary | ICD-10-CM | POA: Diagnosis not present

## 2015-01-04 DIAGNOSIS — I10 Essential (primary) hypertension: Secondary | ICD-10-CM | POA: Diagnosis not present

## 2015-01-12 ENCOUNTER — Encounter: Payer: Self-pay | Admitting: Gastroenterology

## 2015-01-13 DIAGNOSIS — Z961 Presence of intraocular lens: Secondary | ICD-10-CM | POA: Diagnosis not present

## 2015-01-13 DIAGNOSIS — H4011X1 Primary open-angle glaucoma, mild stage: Secondary | ICD-10-CM | POA: Diagnosis not present

## 2015-01-13 DIAGNOSIS — T85398D Other mechanical complication of other ocular prosthetic devices, implants and grafts, subsequent encounter: Secondary | ICD-10-CM | POA: Diagnosis not present

## 2015-01-13 DIAGNOSIS — H26491 Other secondary cataract, right eye: Secondary | ICD-10-CM | POA: Diagnosis not present

## 2015-01-13 DIAGNOSIS — H4011X2 Primary open-angle glaucoma, moderate stage: Secondary | ICD-10-CM | POA: Diagnosis not present

## 2015-01-18 DIAGNOSIS — Z7901 Long term (current) use of anticoagulants: Secondary | ICD-10-CM | POA: Diagnosis not present

## 2015-01-18 DIAGNOSIS — I480 Paroxysmal atrial fibrillation: Secondary | ICD-10-CM | POA: Diagnosis not present

## 2015-01-18 DIAGNOSIS — R42 Dizziness and giddiness: Secondary | ICD-10-CM | POA: Diagnosis not present

## 2015-01-18 DIAGNOSIS — Z6827 Body mass index (BMI) 27.0-27.9, adult: Secondary | ICD-10-CM | POA: Diagnosis not present

## 2015-01-18 DIAGNOSIS — H8110 Benign paroxysmal vertigo, unspecified ear: Secondary | ICD-10-CM | POA: Diagnosis not present

## 2015-01-18 DIAGNOSIS — J302 Other seasonal allergic rhinitis: Secondary | ICD-10-CM | POA: Diagnosis not present

## 2015-01-18 DIAGNOSIS — D649 Anemia, unspecified: Secondary | ICD-10-CM | POA: Diagnosis not present

## 2015-02-10 DIAGNOSIS — H26491 Other secondary cataract, right eye: Secondary | ICD-10-CM | POA: Diagnosis not present

## 2015-02-10 DIAGNOSIS — H4011X1 Primary open-angle glaucoma, mild stage: Secondary | ICD-10-CM | POA: Diagnosis not present

## 2015-02-10 DIAGNOSIS — Z961 Presence of intraocular lens: Secondary | ICD-10-CM | POA: Diagnosis not present

## 2015-02-10 DIAGNOSIS — T85398D Other mechanical complication of other ocular prosthetic devices, implants and grafts, subsequent encounter: Secondary | ICD-10-CM | POA: Diagnosis not present

## 2015-02-10 DIAGNOSIS — H4011X2 Primary open-angle glaucoma, moderate stage: Secondary | ICD-10-CM | POA: Diagnosis not present

## 2015-02-18 DIAGNOSIS — H3531 Nonexudative age-related macular degeneration: Secondary | ICD-10-CM | POA: Diagnosis not present

## 2015-02-22 DIAGNOSIS — Z7901 Long term (current) use of anticoagulants: Secondary | ICD-10-CM | POA: Diagnosis not present

## 2015-02-22 DIAGNOSIS — I480 Paroxysmal atrial fibrillation: Secondary | ICD-10-CM | POA: Diagnosis not present

## 2015-02-26 DIAGNOSIS — C61 Malignant neoplasm of prostate: Secondary | ICD-10-CM | POA: Diagnosis not present

## 2015-03-05 DIAGNOSIS — C61 Malignant neoplasm of prostate: Secondary | ICD-10-CM | POA: Diagnosis not present

## 2015-03-19 ENCOUNTER — Other Ambulatory Visit: Payer: Self-pay | Admitting: Cardiology

## 2015-03-19 MED ORDER — METOPROLOL TARTRATE 25 MG PO TABS: 25.0000 mg | ORAL_TABLET | Freq: Every day | ORAL | Status: DC

## 2015-03-23 ENCOUNTER — Other Ambulatory Visit: Payer: Self-pay

## 2015-04-02 DIAGNOSIS — D649 Anemia, unspecified: Secondary | ICD-10-CM | POA: Diagnosis not present

## 2015-04-02 DIAGNOSIS — Z7901 Long term (current) use of anticoagulants: Secondary | ICD-10-CM | POA: Diagnosis not present

## 2015-04-26 DIAGNOSIS — H4011X1 Primary open-angle glaucoma, mild stage: Secondary | ICD-10-CM | POA: Diagnosis not present

## 2015-04-26 DIAGNOSIS — H4011X2 Primary open-angle glaucoma, moderate stage: Secondary | ICD-10-CM | POA: Diagnosis not present

## 2015-05-05 DIAGNOSIS — Z23 Encounter for immunization: Secondary | ICD-10-CM | POA: Diagnosis not present

## 2015-05-05 DIAGNOSIS — Z7901 Long term (current) use of anticoagulants: Secondary | ICD-10-CM | POA: Diagnosis not present

## 2015-05-17 DIAGNOSIS — I480 Paroxysmal atrial fibrillation: Secondary | ICD-10-CM | POA: Diagnosis not present

## 2015-05-17 DIAGNOSIS — Z9989 Dependence on other enabling machines and devices: Secondary | ICD-10-CM | POA: Diagnosis not present

## 2015-05-17 DIAGNOSIS — G4733 Obstructive sleep apnea (adult) (pediatric): Secondary | ICD-10-CM | POA: Diagnosis not present

## 2015-05-17 DIAGNOSIS — Z6827 Body mass index (BMI) 27.0-27.9, adult: Secondary | ICD-10-CM | POA: Diagnosis not present

## 2015-06-07 DIAGNOSIS — Z7901 Long term (current) use of anticoagulants: Secondary | ICD-10-CM | POA: Diagnosis not present

## 2015-06-07 DIAGNOSIS — H401123 Primary open-angle glaucoma, left eye, severe stage: Secondary | ICD-10-CM | POA: Diagnosis not present

## 2015-06-07 DIAGNOSIS — I480 Paroxysmal atrial fibrillation: Secondary | ICD-10-CM | POA: Diagnosis not present

## 2015-06-08 ENCOUNTER — Ambulatory Visit (INDEPENDENT_AMBULATORY_CARE_PROVIDER_SITE_OTHER): Payer: Medicare Other | Admitting: Cardiology

## 2015-06-08 ENCOUNTER — Encounter: Payer: Self-pay | Admitting: Cardiology

## 2015-06-08 VITALS — BP 104/58 | HR 80 | Ht 71.0 in | Wt 185.0 lb

## 2015-06-08 DIAGNOSIS — I25810 Atherosclerosis of coronary artery bypass graft(s) without angina pectoris: Secondary | ICD-10-CM

## 2015-06-08 DIAGNOSIS — E785 Hyperlipidemia, unspecified: Secondary | ICD-10-CM

## 2015-06-08 DIAGNOSIS — I482 Chronic atrial fibrillation, unspecified: Secondary | ICD-10-CM

## 2015-06-08 DIAGNOSIS — I359 Nonrheumatic aortic valve disorder, unspecified: Secondary | ICD-10-CM | POA: Diagnosis not present

## 2015-06-08 DIAGNOSIS — I493 Ventricular premature depolarization: Secondary | ICD-10-CM

## 2015-06-08 NOTE — Progress Notes (Signed)
[place] Date of Birth: 07-23-1926   History of Present Illness: Jason [name] is seen today for follow up of atrial fibrillation. He is status post aortic valve replacement and coronary bypass surgery in October of 2011. He has a tissue valve prosthesis. He had a LIMA to the LAD. The RCA had 40-50% stenosis treated medically.  He has  atrial fibrillation. He was treated with rate control and anticoagulation with Xarelto. In January 2016 he was admitted with a GIB. Xarelto was held. Hbg dropped to 9.7. he did not require transfusion. 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 Since then he has done well. No recurrent bleeding.  Xarelto was stopped. He is now on Coumadin. INR therapeutic. No chest pain or SOB.  Complains of low energy but this is chronic and unchanged. He does complain of arthritis in his right knee.   Current Outpatient Prescriptions on File Prior to Visit  Medication Sig Dispense Refill  . acetaminophen (TYLENOL) 500 MG tablet Take 500 mg by mouth every 6 (six) hours as needed for moderate pain.     . Calcium Carbonate-Vitamin D (CALCIUM-VITAMIN D) 500-200 MG-UNIT per tablet Take 1 tablet by mouth daily.    . Cholecalciferol 1000 UNITS capsule Take 1,000 Units by mouth daily.     [name] escitalopram (LEXAPRO) 10 MG tablet Take 5 mg by mouth daily.     . fluticasone (FLONASE) 50 MCG/ACT nasal spray Place 1 spray into both nostrils as needed for allergies or rhinitis.    [name] glucosamine-chondroitin 500-400 MG tablet Take 1 tablet by mouth 2 (two) times daily.    . metoprolol tartrate (LOPRESSOR) 25 MG tablet Take 1 tablet (25 mg total) by mouth daily. 90 tablet 3  . Multiple Vitamin (MULTIVITAMIN WITH MINERALS) TABS tablet Take 1 tablet by mouth daily.    [name] omega-3 acid ethyl esters (LOVAZA) 1 G capsule Take 1 g by mouth daily.     [name] perphenazine-amitriptyline (ETRAFON/TRIAVIL) 4-25 MG TABS Take 1 tablet by mouth at bedtime.    . ranitidine  (ZANTAC) 150 MG tablet Take 150 mg by mouth daily.    [name] senna-docusate (SENOKOT-S) 8.6-50 MG per tablet Take 1 tablet by mouth at bedtime.     . vitamin B-12 (CYANOCOBALAMIN) 1000 MCG tablet Take 1,000 mcg by mouth daily.    [name] warfarin (COUMADIN) 5 MG tablet Take 5 mg by mouth daily.     No current facility-administered medications on file prior to visit.    No Known Allergies  Past Medical History  Diagnosis Date  . CAD (coronary artery disease)   . Aortic stenosis   . Atrial fibrillation ([place])   . Dyslipidemia   . Depression   . Hx of CABG   . Aortic valve prosthesis present   . Prostate CA ([place])   . Adenomatous polyp of colon 2010 and before 2006    Past Surgical History  Procedure Laterality Date  . Coronary artery bypass graft  October 2011    LIMA to LAD  . Aortic valve replacement  October 2011    Magna Ease pericardial tissue valve #38mm  . Cataract extraction    . Corneal transplant    . Back surgery    . Tonsillectomy    . Esophagogastroduodenoscopy N/A 09/16/2014    Procedure: ESOPHAGOGASTRODUODENOSCOPY (EGD);  Surgeon: [name], MD;  Location: [facility] ENDOSCOPY;  Service: Endoscopy;  Laterality: N/A;    History  Smoking status  .  Former Smoker -- 1.00 packs/day for 40 years  . Types: Cigarettes  . Quit date: 11/16/1990  Smokeless tobacco  . Never Used    History  Alcohol Use  . 1.2 oz/week  . 2 Glasses of wine per week    Family History  Problem Relation Age of Onset  . Heart attack Father     Review of Systems: As noted in history of present illness. s All other systems were reviewed and are negative.  Physical Exam: BP 104/58 mmHg  Pulse 80  Ht 5\' 11"  (1.803 m)  Wt 83.915 kg (185 lb)  BMI 25.81 kg/m2 Patient is very pleasant and in no acute distress. HEENT is unremarkable. Normocephalic/atraumatic. PERRL. Sclera are nonicteric. Neck is supple. No masses. No JVD. Lungs are clear. Cardiac exam shows an irregularly irregular rate and rhythm.   He has a soft 1-2/6 aortic outflow murmur.   Abdomen is soft. Extremities are without edema. Gait and ROM are intact. No gross neurologic deficits noted.  LABORATORY DATA:   Echo 07/17/14:Study Conclusions  - Left ventricle: The cavity size was normal. Wall thickness was increased in a pattern of mild LVH. There was mild concentric hypertrophy. Systolic function was normal. The estimated ejection fraction was in the range of 55% to 60%. Wall motion was normal; there were no regional wall motion abnormalities. - Ventricular septum: Septal motion showed paradox. - Aortic valve: A bioprosthesis was present and functioning normally. Valve area (VTI): 1.6 cm^2. Valve area (Vmax): 1.5 cm^2. Valve area (Vmean): 1.33 cm^2. - Mitral valve: Calcified, moderately to severely fibrotic annulus. Mildly thickened leaflets . The findings are consistent with trivial stenosis. There was mild regurgitation directed centrally. Valve area by continuity equation (using LVOT flow): 2.6 cm^2. - Left atrium: The atrium was moderately dilated. - Right atrium: The atrium was mildly dilated.    Assessment / Plan: 1. Status post tissue aortic valve replacement for severe aortic stenosis. Clinically doing well. Good valve function by Echo November 2015.  2. Coronary disease status post CABG with an LIMA graft to the LAD in October of 2011. Currently no anginal symptoms.  3. Dyslipidemia well controlled on Crestor and fish oil.   4. PVCs. These are chronic.  5. Atrial fibrillation. Now chronic Currently asymptomatic. Rate controlled on metoprolol. He has a [place] vasc score of 3 placing him at higher risk of CVA. Recent GIB on Xarelto. Now on coumadin without recurrent bleed. Target INR 2-2.5.  6. GIB. 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 At his age and with history of AV disease he is at higher risk of other AVMs.

## 2015-06-08 NOTE — Patient Instructions (Signed)
Continue your current therapy  I will see you in about 6 months   

## 2015-06-10 DIAGNOSIS — M25561 Pain in right knee: Secondary | ICD-10-CM | POA: Diagnosis not present

## 2015-06-10 DIAGNOSIS — J302 Other seasonal allergic rhinitis: Secondary | ICD-10-CM | POA: Diagnosis not present

## 2015-06-10 DIAGNOSIS — Z7901 Long term (current) use of anticoagulants: Secondary | ICD-10-CM | POA: Diagnosis not present

## 2015-06-10 DIAGNOSIS — I480 Paroxysmal atrial fibrillation: Secondary | ICD-10-CM | POA: Diagnosis not present

## 2015-06-10 DIAGNOSIS — J069 Acute upper respiratory infection, unspecified: Secondary | ICD-10-CM | POA: Diagnosis not present

## 2015-07-02 DIAGNOSIS — E784 Other hyperlipidemia: Secondary | ICD-10-CM | POA: Diagnosis not present

## 2015-07-02 DIAGNOSIS — R7301 Impaired fasting glucose: Secondary | ICD-10-CM | POA: Diagnosis not present

## 2015-07-02 DIAGNOSIS — M859 Disorder of bone density and structure, unspecified: Secondary | ICD-10-CM | POA: Diagnosis not present

## 2015-07-02 DIAGNOSIS — Z7901 Long term (current) use of anticoagulants: Secondary | ICD-10-CM | POA: Diagnosis not present

## 2015-07-02 DIAGNOSIS — I739 Peripheral vascular disease, unspecified: Secondary | ICD-10-CM | POA: Diagnosis not present

## 2015-07-02 DIAGNOSIS — E538 Deficiency of other specified B group vitamins: Secondary | ICD-10-CM | POA: Diagnosis not present

## 2015-07-02 DIAGNOSIS — I480 Paroxysmal atrial fibrillation: Secondary | ICD-10-CM | POA: Diagnosis not present

## 2015-07-02 DIAGNOSIS — D649 Anemia, unspecified: Secondary | ICD-10-CM | POA: Diagnosis not present

## 2015-07-02 DIAGNOSIS — Z125 Encounter for screening for malignant neoplasm of prostate: Secondary | ICD-10-CM | POA: Diagnosis not present

## 2015-07-14 DIAGNOSIS — E538 Deficiency of other specified B group vitamins: Secondary | ICD-10-CM | POA: Diagnosis not present

## 2015-07-14 DIAGNOSIS — I480 Paroxysmal atrial fibrillation: Secondary | ICD-10-CM | POA: Diagnosis not present

## 2015-07-14 DIAGNOSIS — M859 Disorder of bone density and structure, unspecified: Secondary | ICD-10-CM | POA: Diagnosis not present

## 2015-07-14 DIAGNOSIS — D649 Anemia, unspecified: Secondary | ICD-10-CM | POA: Diagnosis not present

## 2015-07-14 DIAGNOSIS — E291 Testicular hypofunction: Secondary | ICD-10-CM | POA: Diagnosis not present

## 2015-07-14 DIAGNOSIS — Z9989 Dependence on other enabling machines and devices: Secondary | ICD-10-CM | POA: Diagnosis not present

## 2015-07-14 DIAGNOSIS — Z1389 Encounter for screening for other disorder: Secondary | ICD-10-CM | POA: Diagnosis not present

## 2015-07-14 DIAGNOSIS — I493 Ventricular premature depolarization: Secondary | ICD-10-CM | POA: Diagnosis not present

## 2015-07-14 DIAGNOSIS — Z7901 Long term (current) use of anticoagulants: Secondary | ICD-10-CM | POA: Diagnosis not present

## 2015-07-14 DIAGNOSIS — Z0000 Encounter for general adult medical examination without abnormal findings: Secondary | ICD-10-CM | POA: Diagnosis not present

## 2015-07-14 DIAGNOSIS — K922 Gastrointestinal hemorrhage, unspecified: Secondary | ICD-10-CM | POA: Diagnosis not present

## 2015-07-14 DIAGNOSIS — H9190 Unspecified hearing loss, unspecified ear: Secondary | ICD-10-CM | POA: Diagnosis not present

## 2015-07-14 DIAGNOSIS — Z6825 Body mass index (BMI) 25.0-25.9, adult: Secondary | ICD-10-CM | POA: Diagnosis not present

## 2015-07-15 ENCOUNTER — Encounter: Payer: Self-pay | Admitting: Cardiology

## 2015-08-02 DIAGNOSIS — Z7901 Long term (current) use of anticoagulants: Secondary | ICD-10-CM | POA: Diagnosis not present

## 2015-09-01 DIAGNOSIS — Z7901 Long term (current) use of anticoagulants: Secondary | ICD-10-CM | POA: Diagnosis not present

## 2015-09-01 DIAGNOSIS — I480 Paroxysmal atrial fibrillation: Secondary | ICD-10-CM | POA: Diagnosis not present

## 2015-09-27 DIAGNOSIS — H43811 Vitreous degeneration, right eye: Secondary | ICD-10-CM | POA: Diagnosis not present

## 2015-09-27 DIAGNOSIS — H35361 Drusen (degenerative) of macula, right eye: Secondary | ICD-10-CM | POA: Diagnosis not present

## 2015-09-27 DIAGNOSIS — H4720 Unspecified optic atrophy: Secondary | ICD-10-CM | POA: Diagnosis not present

## 2015-09-27 DIAGNOSIS — H353134 Nonexudative age-related macular degeneration, bilateral, advanced atrophic with subfoveal involvement: Secondary | ICD-10-CM | POA: Diagnosis not present

## 2015-10-04 DIAGNOSIS — Z7901 Long term (current) use of anticoagulants: Secondary | ICD-10-CM | POA: Diagnosis not present

## 2015-10-11 DIAGNOSIS — H401111 Primary open-angle glaucoma, right eye, mild stage: Secondary | ICD-10-CM | POA: Diagnosis not present

## 2015-10-11 DIAGNOSIS — H401123 Primary open-angle glaucoma, left eye, severe stage: Secondary | ICD-10-CM | POA: Diagnosis not present

## 2015-10-18 DIAGNOSIS — C61 Malignant neoplasm of prostate: Secondary | ICD-10-CM | POA: Diagnosis not present

## 2015-10-25 DIAGNOSIS — C61 Malignant neoplasm of prostate: Secondary | ICD-10-CM | POA: Diagnosis not present

## 2015-10-25 DIAGNOSIS — Z0000 Encounter for general adult medical examination without abnormal findings: Secondary | ICD-10-CM | POA: Diagnosis not present

## 2015-10-25 DIAGNOSIS — Z7901 Long term (current) use of anticoagulants: Secondary | ICD-10-CM | POA: Diagnosis not present

## 2015-11-29 DIAGNOSIS — Z7901 Long term (current) use of anticoagulants: Secondary | ICD-10-CM | POA: Diagnosis not present

## 2015-11-29 DIAGNOSIS — I480 Paroxysmal atrial fibrillation: Secondary | ICD-10-CM | POA: Diagnosis not present

## 2015-12-13 ENCOUNTER — Encounter: Payer: Self-pay | Admitting: Cardiology

## 2015-12-13 ENCOUNTER — Ambulatory Visit (INDEPENDENT_AMBULATORY_CARE_PROVIDER_SITE_OTHER): Payer: Medicare Other | Admitting: Cardiology

## 2015-12-13 VITALS — BP 110/62 | HR 92 | Ht 71.0 in | Wt 185.0 lb

## 2015-12-13 DIAGNOSIS — I493 Ventricular premature depolarization: Secondary | ICD-10-CM

## 2015-12-13 DIAGNOSIS — I482 Chronic atrial fibrillation, unspecified: Secondary | ICD-10-CM

## 2015-12-13 DIAGNOSIS — E785 Hyperlipidemia, unspecified: Secondary | ICD-10-CM

## 2015-12-13 DIAGNOSIS — I25810 Atherosclerosis of coronary artery bypass graft(s) without angina pectoris: Secondary | ICD-10-CM

## 2015-12-13 DIAGNOSIS — I359 Nonrheumatic aortic valve disorder, unspecified: Secondary | ICD-10-CM

## 2015-12-13 NOTE — Patient Instructions (Signed)
Continue your current therapy  I will see you in 6 months.   

## 2015-12-13 NOTE — Progress Notes (Signed)
[place] Date of Birth: [birth year]   History of Present Illness: Mr. Wirts is seen today for follow up of atrial fibrillation. He is status post aortic valve replacement and coronary bypass surgery in October of 2011. He has a tissue valve prosthesis. He had a LIMA to the LAD. The RCA had 40-50% stenosis treated medically.  He has  atrial fibrillation. He was treated with rate control and anticoagulation with Xarelto. In January 2016 he was admitted with a GIB. Xarelto was held. Hbg dropped to 9.7. he did not require transfusion. 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 Since then he has been on coumadin and has done well. No recurrent bleeding.  He reports INRs have been therapeutic.  No chest pain or SOB. Energy level is good and he is active. Lives at [facility].  He did note one episode 2 months ago when he got lightheaded when he stood up. Wife took his BP and it was high. Checked it later and it came down. No symptoms since then.  Current Outpatient Prescriptions on File Prior to Visit  Medication Sig Dispense Refill  . acetaminophen (TYLENOL) 500 MG tablet Take 500 mg by mouth every 6 (six) hours as needed for moderate pain.     . Calcium Carbonate-Vitamin D (CALCIUM-VITAMIN D) 500-200 MG-UNIT per tablet Take 1 tablet by mouth daily.    . Cholecalciferol 1000 UNITS capsule Take 1,000 Units by mouth daily.     [name] escitalopram (LEXAPRO) 10 MG tablet Take 5 mg by mouth daily.     . fluticasone (FLONASE) 50 MCG/ACT nasal spray Place 1 spray into both nostrils as needed for allergies or rhinitis.    [name] glucosamine-chondroitin 500-400 MG tablet Take 1 tablet by mouth 2 (two) times daily.    [name] latanoprost (XALATAN) 0.005 % ophthalmic solution Place 1 drop into both eyes daily.  11  . metoprolol tartrate (LOPRESSOR) 25 MG tablet Take 1 tablet (25 mg total) by mouth daily. 90 tablet 3  . Multiple Vitamin (MULTIVITAMIN WITH MINERALS) TABS tablet Take  1 tablet by mouth daily.    [name] omega-3 acid ethyl esters (LOVAZA) 1 G capsule Take 1 g by mouth daily.     [name] perphenazine-amitriptyline (ETRAFON/TRIAVIL) 4-25 MG TABS Take 1 tablet by mouth at bedtime.    . prednisoLONE acetate (PRED FORTE) 1 % ophthalmic suspension Place 1 drop into the right eye daily.    . ranitidine (ZANTAC) 150 MG tablet Take 150 mg by mouth daily.    [name] senna-docusate (SENOKOT-S) 8.6-50 MG per tablet Take 1 tablet by mouth at bedtime.     . vitamin B-12 (CYANOCOBALAMIN) 1000 MCG tablet Take 1,000 mcg by mouth daily.    [name] warfarin (COUMADIN) 5 MG tablet Take 5 mg by mouth. AS Directed     No current facility-administered medications on file prior to visit.    No Known Allergies  Past Medical History  Diagnosis Date  . CAD (coronary artery disease)   . Aortic stenosis   . Atrial fibrillation ([place])   . Dyslipidemia   . Depression   . Hx of CABG   . Aortic valve prosthesis present   . Prostate CA ([place])   . Adenomatous polyp of colon 2010 and before 2006    Past Surgical History  Procedure Laterality Date  . Coronary artery bypass graft  October 2011    LIMA to LAD  . Aortic valve replacement  October  2011    Magna Ease pericardial tissue valve #72mm  . Cataract extraction    . Corneal transplant    . Back surgery    . Tonsillectomy    . Esophagogastroduodenoscopy N/A 09/16/2014    Procedure: ESOPHAGOGASTRODUODENOSCOPY (EGD);  Surgeon: [name], MD;  Location: [facility] ENDOSCOPY;  Service: Endoscopy;  Laterality: N/A;    History  Smoking status  . Former Smoker -- 1.00 packs/day for 40 years  . Types: Cigarettes  . Quit date: 11/16/1990  Smokeless tobacco  . Never Used    History  Alcohol Use  . 1.2 oz/week  . 2 Glasses of wine per week    Family History  Problem Relation Age of Onset  . Heart attack Father     Review of Systems: As noted in history of present illness. s All other systems were reviewed and are negative.  Physical Exam: BP  110/62 mmHg  Pulse 92  Ht 5\' 11"  (1.803 m)  Wt 83.915 kg (185 lb)  BMI 25.81 kg/m2 Patient is very pleasant and in no acute distress. HEENT is unremarkable. Normocephalic/atraumatic. PERRL. Sclera are nonicteric. Neck is supple. No masses. No JVD. Lungs are clear. Cardiac exam shows an irregularly irregular rate and rhythm.  He has a soft 1-2/6 aortic outflow murmur.   Abdomen is soft. Extremities are without edema. Gait and ROM are intact. No gross neurologic deficits noted.  LABORATORY DATA:   Ecg today shows Afib with rate 92. LAFB, old anterior infarct. I have personally reviewed and interpreted this study.   Assessment / Plan: 1. Status post tissue aortic valve replacement for severe aortic stenosis. Clinically doing well. Good valve function by Echo November 2015.  2. Coronary disease status post CABG with an LIMA graft to the LAD in October of 2011. He is asymptomatic.  3. Dyslipidemia well controlled on Crestor and fish oil.   4. PVCs. These are chronic. Asymptomatic.  5. Atrial fibrillation. Chronic. Asymptomatic. Rate controlled on metoprolol. He has a [place] vasc score of 3 placing him at higher risk of CVA. History of GIB on Xarelto. Now on coumadin without recurrent bleed. Target INR 2-2.5. If he should have recurrent bleeding on coumadin would consider him for a LA occlusive device ([place]man).  6. GIB. 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 At his age and with history of AV disease he is at higher risk of other AVMs. Will follow  I will see in 6 months.

## 2016-01-03 DIAGNOSIS — Z7901 Long term (current) use of anticoagulants: Secondary | ICD-10-CM | POA: Diagnosis not present

## 2016-01-03 DIAGNOSIS — I480 Paroxysmal atrial fibrillation: Secondary | ICD-10-CM | POA: Diagnosis not present

## 2016-01-12 DIAGNOSIS — R143 Flatulence: Secondary | ICD-10-CM | POA: Diagnosis not present

## 2016-01-12 DIAGNOSIS — R7301 Impaired fasting glucose: Secondary | ICD-10-CM | POA: Diagnosis not present

## 2016-01-12 DIAGNOSIS — E538 Deficiency of other specified B group vitamins: Secondary | ICD-10-CM | POA: Diagnosis not present

## 2016-01-12 DIAGNOSIS — Z6826 Body mass index (BMI) 26.0-26.9, adult: Secondary | ICD-10-CM | POA: Diagnosis not present

## 2016-01-12 DIAGNOSIS — Z7901 Long term (current) use of anticoagulants: Secondary | ICD-10-CM | POA: Diagnosis not present

## 2016-01-12 DIAGNOSIS — Z1389 Encounter for screening for other disorder: Secondary | ICD-10-CM | POA: Diagnosis not present

## 2016-01-12 DIAGNOSIS — I480 Paroxysmal atrial fibrillation: Secondary | ICD-10-CM | POA: Diagnosis not present

## 2016-01-12 DIAGNOSIS — C61 Malignant neoplasm of prostate: Secondary | ICD-10-CM | POA: Diagnosis not present

## 2016-01-12 DIAGNOSIS — D649 Anemia, unspecified: Secondary | ICD-10-CM | POA: Diagnosis not present

## 2016-01-12 DIAGNOSIS — M170 Bilateral primary osteoarthritis of knee: Secondary | ICD-10-CM | POA: Diagnosis not present

## 2016-01-19 ENCOUNTER — Other Ambulatory Visit (HOSPITAL_COMMUNITY): Payer: Self-pay | Admitting: *Deleted

## 2016-01-20 ENCOUNTER — Encounter (HOSPITAL_COMMUNITY)
Admission: RE | Admit: 2016-01-20 | Discharge: 2016-01-20 | Disposition: A | Discharge status: Home / Self Care | Payer: Medicare Other | Source: Ambulatory Visit | Attending: Internal Medicine | Admitting: Internal Medicine

## 2016-01-20 DIAGNOSIS — D649 Anemia, unspecified: Secondary | ICD-10-CM | POA: Insufficient documentation

## 2016-01-20 MED ORDER — SODIUM CHLORIDE 0.9 % IV SOLN
510.0000 mg | INTRAVENOUS | Status: DC
  Administered 2016-01-20: 510 mg via INTRAVENOUS
  Filled 2016-01-20: qty 17

## 2016-01-26 ENCOUNTER — Ambulatory Visit (HOSPITAL_COMMUNITY)
Admission: RE | Admit: 2016-01-26 | Discharge: 2016-01-26 | Disposition: A | Discharge status: Home / Self Care | Payer: Medicare Other | Source: Ambulatory Visit | Attending: Internal Medicine | Admitting: Internal Medicine

## 2016-01-26 DIAGNOSIS — D649 Anemia, unspecified: Secondary | ICD-10-CM | POA: Insufficient documentation

## 2016-01-26 MED ORDER — SODIUM CHLORIDE 0.9 % IV SOLN
510.0000 mg | INTRAVENOUS | Status: DC
  Administered 2016-01-26: 510 mg via INTRAVENOUS
  Filled 2016-01-26: qty 17

## 2016-01-31 DIAGNOSIS — D6489 Other specified anemias: Secondary | ICD-10-CM | POA: Diagnosis not present

## 2016-01-31 DIAGNOSIS — I480 Paroxysmal atrial fibrillation: Secondary | ICD-10-CM | POA: Diagnosis not present

## 2016-01-31 DIAGNOSIS — Z7901 Long term (current) use of anticoagulants: Secondary | ICD-10-CM | POA: Diagnosis not present

## 2016-03-01 DIAGNOSIS — I480 Paroxysmal atrial fibrillation: Secondary | ICD-10-CM | POA: Diagnosis not present

## 2016-03-01 DIAGNOSIS — D6489 Other specified anemias: Secondary | ICD-10-CM | POA: Diagnosis not present

## 2016-03-01 DIAGNOSIS — Z7901 Long term (current) use of anticoagulants: Secondary | ICD-10-CM | POA: Diagnosis not present

## 2016-03-06 ENCOUNTER — Encounter: Payer: Self-pay | Admitting: Internal Medicine

## 2016-03-06 ENCOUNTER — Ambulatory Visit (INDEPENDENT_AMBULATORY_CARE_PROVIDER_SITE_OTHER): Payer: Medicare Other | Admitting: Internal Medicine

## 2016-03-06 VITALS — BP 116/66 | HR 88 | Ht 71.0 in | Wt 179.6 lb

## 2016-03-06 DIAGNOSIS — Z8679 Personal history of other diseases of the circulatory system: Secondary | ICD-10-CM | POA: Diagnosis not present

## 2016-03-06 DIAGNOSIS — D509 Iron deficiency anemia, unspecified: Secondary | ICD-10-CM | POA: Diagnosis not present

## 2016-03-06 DIAGNOSIS — I25810 Atherosclerosis of coronary artery bypass graft(s) without angina pectoris: Secondary | ICD-10-CM | POA: Diagnosis not present

## 2016-03-06 DIAGNOSIS — Z8774 Personal history of (corrected) congenital malformations of heart and circulatory system: Secondary | ICD-10-CM

## 2016-03-06 NOTE — Progress Notes (Signed)
HISTORY OF PRESENT ILLNESS:  Jason [name] is a 82 y.o. male with multiple significant medical problems as listed below. He is referred by his primary care provider Dr. Joylene Draft regarding anemia. The patient is accompanied by his wife. He is on chronic anticoagulation therapy in the form of Coumadin. Patient has had long-standing chronic anemia. In 2011 hemoglobins ranged between 8.0 and 8.7. Proximal one year ago hemoglobins ranged between 9.7 and 10.9. The patient was last seen in this office in 2010. His last colonoscopy performed in 2010 revealed moderate diverticulosis and a diminutive rectal polyp which was removed and found to be a benign adenoma. He did undergo upper endoscopy in January 2016 when he was hospitalized with melena. 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 Iron supplementation daily was recommended. He continues on once daily iron supplementation. He continues on anticoagulation. He denies any obvious melena or hematochezia. They tell me that his hemoglobins have been low in the 9 range. He was given iron infusion therapy twice in May without significant improvement in hemoglobin. Review of outside blood work from 01/12/2016 reveals hemoglobin of 9.5. Elevated MCV 100.9. Normal BUN and creatinine. Normal TSH. INR 2.0. Normal B12 level do not see ferritin. Iron saturation 6%. He states she's been feeling well. No abdominal complaints or weight loss.  REVIEW OF SYSTEMS:  All non-GI ROS negative except upon comprehensive review of all systems  Past Medical History  Diagnosis Date  . CAD (coronary artery disease)   . Aortic stenosis   . Atrial fibrillation ([place])   . Dyslipidemia   . Depression   . Hx of CABG   . Aortic valve prosthesis present   . Prostate CA ([place])   . Adenomatous polyp of colon 2010 and before 2006  . Hypertension   . Glaucoma   . Macular degeneration   . OSA (obstructive sleep apnea)   . Osteopenia   .  DDD (degenerative disc disease), lumbar   . IFG (impaired fasting glucose)   . GERD (gastroesophageal reflux disease)   . Osteoarthritis   . Abnormal PSA   . Hypogonadism, male   . Anemia   . Diverticulosis     Past Surgical History  Procedure Laterality Date  . Coronary artery bypass graft  October 2011    LIMA to LAD  . Aortic valve replacement  October 2011    Magna Ease pericardial tissue valve #34mm  . Cataract extraction    . Corneal transplant    . Back surgery    . Tonsillectomy    . Esophagogastroduodenoscopy N/A 09/16/2014    Procedure: ESOPHAGOGASTRODUODENOSCOPY (EGD);  Surgeon: [name], MD;  Location[facility];  Service: Endoscopy;  Laterality: N/A;    Social History Jason [name]  reports that he quit smoking about 25 years ago. His smoking use included Cigarettes. He has a 40 pack-year smoking history. He has never used smokeless tobacco. He reports that he drinks about 1.2 oz of alcohol per week. He reports that he does not use illicit drugs.  family history includes Heart attack in his father.  No Known Allergies     PHYSICAL EXAMINATION: Vital signs: BP 116/66 mmHg  Pulse 88  Ht 5\' 11"  (1.803 m)  Wt 179 lb 9.6 oz (81.466 kg)  BMI 25.06 kg/m2  Constitutional: generally well-appearing, no acute distress Psychiatric: alert and oriented x3, cooperative Eyes: extraocular movements intact, anicteric, conjunctiva pink Mouth: oral pharynx moist, no lesions Neck: supple  no lymphadenopathy Cardiovascular: heart regular rate and rhythm, 2/6 systolic murmur Lungs: clear to auscultation bilaterally Abdomen: soft, nontender, nondistended, no obvious ascites, no peritoneal signs, normal bowel sounds, no organomegaly Rectal: Omitted Extremities: no clubbing, cyanosis, or significant lower extremity edema bilaterally Skin: no lesions on visible extremities Neuro: No focal deficits. Nerves intact  ASSESSMENT:  #1. Chronic anemia. Ongoing. Elevated MCV.  Not clear that his chronic anemia is solely related to GI blood loss #2. History of gastric AVM status post APC ablation #3. Multiple medical problems on chronic anticoagulation therapy  PLAN:  #1. Continue iron. May need to increase if iron deficiency is demonstrated #2. Schedule capsule endoscopy to evaluate small bowel. Rule out AVMs or other significant pathology that might contribute to anemia.The nature of the procedure, as well as the risks, benefits, and alternatives were carefully and thoroughly reviewed with the patient. Ample time for discussion and questions allowed. The patient understood, was satisfied, and agreed to proceed. #3. Hematology evaluation regarding chronic persistent anemia. Apparently this has been arranged #4. Ongoing general medical care with Dr. Joylene Draft

## 2016-03-06 NOTE — Patient Instructions (Signed)
You have been scheduled for a capsule endoscopy on 03/15/2016

## 2016-03-13 ENCOUNTER — Telehealth: Payer: Self-pay | Admitting: Hematology

## 2016-03-13 ENCOUNTER — Encounter: Payer: Self-pay | Admitting: Hematology

## 2016-03-13 NOTE — Telephone Encounter (Signed)
Appointment with Jason [name] on 8/2 at 2pm. Patient's spouse agreed to date and time. Letter faxed to the referring. Demographics verified.

## 2016-03-15 ENCOUNTER — Ambulatory Visit (INDEPENDENT_AMBULATORY_CARE_PROVIDER_SITE_OTHER): Payer: Medicare Other | Admitting: Internal Medicine

## 2016-03-15 DIAGNOSIS — D509 Iron deficiency anemia, unspecified: Secondary | ICD-10-CM | POA: Diagnosis not present

## 2016-03-15 NOTE — Progress Notes (Signed)
Patient here for capsule endoscopy accompanied by his wife. Tolerated procedure. Both verbalize understanding of written and verbal instructions. Capsule ID [id number] Exp 2017-05-19

## 2016-03-16 ENCOUNTER — Other Ambulatory Visit: Payer: Self-pay | Admitting: *Deleted

## 2016-03-16 MED ORDER — METOPROLOL TARTRATE 25 MG PO TABS: 25.0000 mg | ORAL_TABLET | Freq: Every day | ORAL | Status: DC

## 2016-03-16 NOTE — Telephone Encounter (Signed)
REFILL 

## 2016-03-22 ENCOUNTER — Telehealth: Payer: Self-pay

## 2016-03-22 NOTE — Telephone Encounter (Signed)
Patient is informed of the capsule endoscopy results. Hematology appointment is 03/29/16. The patient states he is not certain the capsule passed. He did not see any evidence of it in his stool.  Do you want a KUB?

## 2016-03-23 ENCOUNTER — Other Ambulatory Visit: Payer: Self-pay

## 2016-03-23 ENCOUNTER — Ambulatory Visit (INDEPENDENT_AMBULATORY_CARE_PROVIDER_SITE_OTHER)
Admission: RE | Admit: 2016-03-23 | Discharge: 2016-03-23 | Disposition: A | Discharge status: Home / Self Care | Payer: Medicare Other | Source: Ambulatory Visit | Attending: Internal Medicine | Admitting: Internal Medicine

## 2016-03-23 ENCOUNTER — Encounter: Payer: Self-pay | Admitting: Internal Medicine

## 2016-03-23 DIAGNOSIS — Z189 Retained foreign body fragments, unspecified material: Secondary | ICD-10-CM

## 2016-03-23 DIAGNOSIS — K6389 Other specified diseases of intestine: Secondary | ICD-10-CM | POA: Diagnosis not present

## 2016-03-23 NOTE — Telephone Encounter (Signed)
Yes, KUB please "rule out capsule retention"

## 2016-03-23 NOTE — Telephone Encounter (Signed)
Patient agrees to this plan. He will come in today or tomorrow for the KUB

## 2016-03-29 ENCOUNTER — Telehealth: Payer: Self-pay | Admitting: Hematology

## 2016-03-29 ENCOUNTER — Ambulatory Visit (HOSPITAL_BASED_OUTPATIENT_CLINIC_OR_DEPARTMENT_OTHER): Payer: Medicare Other | Admitting: Hematology

## 2016-03-29 ENCOUNTER — Ambulatory Visit (HOSPITAL_BASED_OUTPATIENT_CLINIC_OR_DEPARTMENT_OTHER): Payer: Medicare Other

## 2016-03-29 ENCOUNTER — Encounter: Payer: Self-pay | Admitting: Hematology

## 2016-03-29 VITALS — BP 138/65 | HR 102 | Temp 97.5°F | Resp 20 | Ht 71.0 in | Wt 181.8 lb

## 2016-03-29 DIAGNOSIS — E538 Deficiency of other specified B group vitamins: Secondary | ICD-10-CM

## 2016-03-29 DIAGNOSIS — Z7901 Long term (current) use of anticoagulants: Secondary | ICD-10-CM | POA: Diagnosis not present

## 2016-03-29 DIAGNOSIS — D649 Anemia, unspecified: Secondary | ICD-10-CM | POA: Diagnosis not present

## 2016-03-29 DIAGNOSIS — D509 Iron deficiency anemia, unspecified: Secondary | ICD-10-CM | POA: Diagnosis not present

## 2016-03-29 DIAGNOSIS — I25810 Atherosclerosis of coronary artery bypass graft(s) without angina pectoris: Secondary | ICD-10-CM

## 2016-03-29 DIAGNOSIS — I480 Paroxysmal atrial fibrillation: Secondary | ICD-10-CM | POA: Diagnosis not present

## 2016-03-29 LAB — CBC & DIFF AND RETIC
BASO%: 2.2 % — AB (ref 0.0–2.0)
Basophils Absolute: 0.2 10*3/uL — ABNORMAL HIGH (ref 0.0–0.1)
EOS ABS: 0.4 10*3/uL (ref 0.0–0.5)
EOS%: 6.1 % (ref 0.0–7.0)
HCT: 26.4 % — ABNORMAL LOW (ref 38.4–49.9)
HEMOGLOBIN: 8.3 g/dL — AB (ref 13.0–17.1)
Immature Retic Fract: 24.8 % — ABNORMAL HIGH (ref 3.00–10.60)
LYMPH%: 25.8 % (ref 14.0–49.0)
MCH: 28.3 pg (ref 27.2–33.4)
MCHC: 31.4 g/dL — ABNORMAL LOW (ref 32.0–36.0)
MCV: 90.1 fL (ref 79.3–98.0)
MONO#: 0.8 10*3/uL (ref 0.1–0.9)
MONO%: 11.4 % (ref 0.0–14.0)
NEUT%: 54.5 % (ref 39.0–75.0)
NEUTROS ABS: 3.7 10*3/uL (ref 1.5–6.5)
Platelets: 257 10*3/uL (ref 140–400)
RBC: 2.93 10*6/uL — AB (ref 4.20–5.82)
RDW: 14.8 % — AB (ref 11.0–14.6)
RETIC %: 2.28 % — AB (ref 0.80–1.80)
Retic Ct Abs: 66.8 10*3/uL (ref 34.80–93.90)
WBC: 6.8 10*3/uL (ref 4.0–10.3)
lymph#: 1.8 10*3/uL (ref 0.9–3.3)

## 2016-03-29 LAB — COMPREHENSIVE METABOLIC PANEL
ALK PHOS: 56 U/L (ref 40–150)
ALT: 14 U/L (ref 0–55)
ANION GAP: 8 meq/L (ref 3–11)
AST: 16 U/L (ref 5–34)
Albumin: 3.8 g/dL (ref 3.5–5.0)
BUN: 15.5 mg/dL (ref 7.0–26.0)
CALCIUM: 8.9 mg/dL (ref 8.4–10.4)
CHLORIDE: 108 meq/L (ref 98–109)
CO2: 24 mEq/L (ref 22–29)
Creatinine: 1 mg/dL (ref 0.7–1.3)
EGFR: 64 mL/min/{1.73_m2} — AB (ref >90)
Glucose: 98 mg/dl (ref 70–140)
POTASSIUM: 4.3 meq/L (ref 3.5–5.1)
Sodium: 141 mEq/L (ref 136–145)
Total Bilirubin: <0.3 mg/dL (ref 0.20–1.20)
Total Protein: 7.5 g/dL (ref 6.4–8.3)

## 2016-03-29 LAB — LACTATE DEHYDROGENASE: LDH: 183 U/L (ref 125–245)

## 2016-03-29 LAB — TECHNOLOGIST REVIEW

## 2016-03-29 NOTE — Telephone Encounter (Signed)
appt made and avs printed. Pt sent back to lab per pof

## 2016-03-29 NOTE — Progress Notes (Signed)
[name]    HEMATOLOGY/ONCOLOGY CONSULTATION NOTE  Date of Service: 03/29/2016  Patient Care Team: [name], MD as PCP - General (Internal Medicine)  CHIEF COMPLAINTS/PURPOSE OF CONSULTATION:  Iron deficiency Anemia  HISTORY OF PRESENTING ILLNESS:   Jason [name] is a wonderful 82 y.o. male who has been referred to [place] by Dr .Jerlyn Ly, MD for evaluation and management of Iron deficiency anemia.  He has multiple medical co-morbids as noted below including HTN, afib on coumadin, Aortic stenosis (AVR), GERD, prostate cancer (Mx by Dr Risa Grill)  who has had issues with IDA requiring PRBC transfusions in 09/2014 and 12/2015. His labs with his PCP has show anemia with hgb in the 8-10 range with low iron sat, nl B12 and SPEP neg for M spike He notes no overt GI bleeding. No overt hematuria.  GI workup with Dr Henrene Pastor --capsule endoscopy 02/2016 showed few tiny scattered AVM's in the small bowel otherwise normal study. EGD 08/2014-proximal gastric AVM. Colonoscopy last 10/2008 - moderate diverticulosis. Sessile polyp removed from rectum. He has elevated PSA level that is concern for recurrent prostate cancer -- being monitored by Dr Risa Grill. Has been on PO iron replacement.  MEDICAL HISTORY:  Past Medical History:  Diagnosis Date  . Abnormal PSA   . Adenomatous polyp of colon 2010 and before 2006  . Anemia   . Aortic stenosis   . Aortic valve prosthesis present   . Atrial fibrillation ([place])   . CAD (coronary artery disease)   . DDD (degenerative disc disease), lumbar   . Depression   . Diverticulosis   . Dyslipidemia   . GERD (gastroesophageal reflux disease)   . Glaucoma   . Hx of CABG   . Hypertension   . Hypogonadism, male   . IFG (impaired fasting glucose)   . Macular degeneration   . OSA (obstructive sleep apnea)   . Osteoarthritis   . Osteopenia   . Prostate CA [facility])     SURGICAL HISTORY: Past Surgical History:  Procedure Laterality Date  . AORTIC VALVE REPLACEMENT   October 2011   Magna Ease pericardial tissue valve #55m  . BACK SURGERY    . CATARACT EXTRACTION    . CORNEAL TRANSPLANT    . CORONARY ARTERY BYPASS GRAFT  October 2011   LIMA to LAD  . ESOPHAGOGASTRODUODENOSCOPY N/A 09/16/2014   Procedure: ESOPHAGOGASTRODUODENOSCOPY (EGD);  Surgeon: JIrene Shipper MD;  [facility];  Service: Endoscopy;  Laterality: N/A;  . TONSILLECTOMY      SOCIAL HISTORY: Social History   Social History  . Marital status: Married    Spouse name: N/A  . Number of children: N/A  . Years of education: N/A   Occupational History  . Not on file.   Social History Main Topics  . Smoking status: Former Smoker    Packs/day: 1.00    Years: 40.00    Types: Cigarettes    Quit date: 11/16/1990  . Smokeless tobacco: Never Used  . Alcohol use 1.2 oz/week    2 Glasses of wine per week  . Drug use: No  . Sexual activity: Not on file   Other Topics Concern  . Not on file   Social History Narrative  . No narrative on file    FAMILY HISTORY: Family History  Problem Relation Age of Onset  . Heart attack Father     ALLERGIES:  has No Known Allergies.  MEDICATIONS:  Current Outpatient Prescriptions  Medication Sig Dispense Refill  . acetaminophen (TYLENOL)  500 MG tablet Take 500 mg by mouth every 6 (six) hours as needed for moderate pain.     [name] amoxicillin (AMOXIL) 500 MG capsule FOR DENTAL USED ONLY    . Calcium Carbonate-Vitamin D (CALCIUM-VITAMIN D) 500-200 MG-UNIT per tablet Take 1 tablet by mouth daily.    . Cholecalciferol 1000 UNITS capsule Take 1,000 Units by mouth daily.     . dorzolamide (TRUSOPT) 2 % ophthalmic solution PLACE 1 DROP INTO BOTH EYES 3 TIMES DAILY.  11  . ferrous sulfate 325 (65 FE) MG tablet Take 325 mg by mouth daily with breakfast.    . fluticasone (FLONASE) 50 MCG/ACT nasal spray Place 1 spray into both nostrils as needed for allergies or rhinitis.    [name] glucosamine-chondroitin 500-400 MG tablet Take 1 tablet by mouth 2  (two) times daily.    [name] latanoprost (XALATAN) 0.005 % ophthalmic solution Place 1 drop into both eyes daily.  11  . metoprolol tartrate (LOPRESSOR) 25 MG tablet Take 1 tablet (25 mg total) by mouth daily. NEED OV. 90 tablet 0  . Multiple Vitamin (MULTIVITAMIN WITH MINERALS) TABS tablet Take 1 tablet by mouth daily.    [name] omega-3 acid ethyl esters (LOVAZA) 1 G capsule Take 1 g by mouth daily.     [name] perphenazine-amitriptyline (ETRAFON/TRIAVIL) 4-25 MG TABS Take 1 tablet by mouth at bedtime.    . prednisoLONE acetate (PRED FORTE) 1 % ophthalmic suspension Place 1 drop into the right eye daily.    . ranitidine (ZANTAC) 150 MG tablet Take 150 mg by mouth daily.    . rosuvastatin (CRESTOR) 5 MG tablet Take 5 mg by mouth. Every other day    . senna-docusate (SENOKOT-S) 8.6-50 MG per tablet Take 1 tablet by mouth at bedtime.     . vitamin B-12 (CYANOCOBALAMIN) 1000 MCG tablet Take 1,000 mcg by mouth daily.    [name] warfarin (COUMADIN) 5 MG tablet Take 5 mg by mouth. AS Directed     No current facility-administered medications for this visit.     REVIEW OF SYSTEMS:    10 Point review of Systems was done is negative except as noted above.  PHYSICAL EXAMINATION: ECOG PERFORMANCE STATUS: 2 - Symptomatic, <50% confined to bed  . Vitals:   03/29/16 1330  BP: 138/65  Pulse: (!) 102  Resp: 20  Temp: 97.5 F (36.4 C)   Filed Weights   03/29/16 1330  Weight: 181 lb 12.8 oz (82.5 kg)   .Body mass index is 25.36 kg/m.  GENERAL:alert, in no acute distress and comfortable SKIN: skin color, texture, turgor are normal, no rashes or significant lesions EYES: normal, conjunctiva are pink and non-injected, sclera clear OROPHARYNX:no exudate, no erythema and lips, buccal mucosa, and tongue normal  NECK: supple, no JVD, thyroid normal size, non-tender, without nodularity LYMPH:  no palpable lymphadenopathy in the cervical, axillary or inguinal LUNGS: decreased AE bilateral, few scattered rhonci HEART:  irregular s1s2 ABDOMEN: abdomen soft, non-tender, normoactive bowel sounds  Musculoskeletal: no cyanosis of digits and no clubbing  PSYCH: alert & oriented x 3 with fluent speech NEURO: no focal motor/sensory deficits  LABORATORY DATA:  I have reviewed the data as listed  Component     Latest Ref Rng & Units 03/29/2016  WBC     4.0 - 10.3 10e3/uL 6.8  NEUT#     1.5 - 6.5 10e3/uL 3.7  Hemoglobin     13.0 - 17.1 g/dL 8.3 (L)  HCT     38.4 - 49.9 %  26.4 (L)  Platelets     140 - 400 10e3/uL 257  MCV     79.3 - 98.0 fL 90.1  MCH     27.2 - 33.4 pg 28.3  MCHC     32.0 - 36.0 g/dL 31.4 (L)  RBC     4.20 - 5.82 10e6/uL 2.93 (L)  RDW     11.0 - 14.6 % 14.8 (H)  lymph#     0.9 - 3.3 10e3/uL 1.8  MONO#     0.1 - 0.9 10e3/uL 0.8  Eosinophils Absolute     0.0 - 0.5 10e3/uL 0.4  Basophils Absolute     0.0 - 0.1 10e3/uL 0.2 (H)  NEUT%     39.0 - 75.0 % 54.5  LYMPH%     14.0 - 49.0 % 25.8  MONO%     0.0 - 14.0 % 11.4  EOS%     0.0 - 7.0 % 6.1  BASO%     0.0 - 2.0 % 2.2 (H)  Retic %     0.80 - 1.80 % 2.28 (H)  Retic Ct Abs     34.80 - 93.90 10e3/uL 66.80  Immature Retic Fract     3.00 - 10.60 % 24.80 (H)  Sodium     136 - 145 mEq/L 141  Potassium     3.5 - 5.1 mEq/L 4.3  Chloride     98 - 109 mEq/L 108  CO2     22 - 29 mEq/L 24  Glucose     70 - 140 mg/dl 98  BUN     7.0 - 26.0 mg/dL 15.5  Creatinine     0.7 - 1.3 mg/dL 1.0  Total Bilirubin     0.20 - 1.20 mg/dL <0.30  Alkaline Phosphatase     40 - 150 U/L 56  AST     5 - 34 U/L 16  ALT     0 - 55 U/L 14  Total Protein     6.4 - 8.3 g/dL 7.5  Albumin     3.5 - 5.0 g/dL 3.8  Calcium     8.4 - 10.4 mg/dL 8.9  Anion gap     3 - 11 mEq/L 8  EGFR     >90 ml/min/1.73 m2 64 (L)  IgG (Immunoglobin G), Serum     700 - 1600 mg/dL 1,155  IgA/Immunoglobulin A, Serum     61 - 437 mg/dL 226  IgM, Qn, Serum     15 - 143 mg/dL 57  Total Protein     6.0 - 8.5 g/dL 6.8  Albumin SerPl Elph-Mcnc     2.9 - 4.4  g/dL 3.6  Alpha 1     0.0 - 0.4 g/dL 0.2  Alpha2 Glob SerPl Elph-Mcnc     0.4 - 1.0 g/dL 0.7  B-Globulin SerPl Elph-Mcnc     0.7 - 1.3 g/dL 1.0  Gamma Glob SerPl Elph-Mcnc     0.4 - 1.8 g/dL 1.2  M Protein SerPl Elph-Mcnc     Not Observed g/dL Not Observed  Globulin, Total     2.2 - 3.9 g/dL 3.2  Albumin/Glob SerPl     0.7 - 1.7 1.2  IFE 1      Comment  Please Note (HCV):      Comment  Iron     42 - 163 ug/dL 19 (L)  TIBC     202 - 409 ug/dL 406  UIBC     117 -  376 ug/dL 387 (H)  %SAT     20 - 55 % 5 (L)  Ig Kappa Free Light Chain     3.3 - 19.4 mg/L 42.2 (H)  Ig Lambda Free Light Chain     5.7 - 26.3 mg/L 22.8  Kappa/Lambda FluidC Ratio     0.26 - 1.65 1.85 (H)  Ferritin     22 - 316 ng/ml 11 (L)  Vitamin B12     211 - 946 pg/mL 958 (H)  Sed Rate     0 - 30 mm/hr 23  LDH     125 - 245 U/L 183  Haptoglobin     34 - 200 mg/dL 169   RADIOGRAPHIC STUDIES: I have personally reviewed the radiological images as listed and agreed with the findings in the report. Dg Abd 1 View  Result Date: 03/23/2016 CLINICAL DATA:  Small bowel camera administered 2 weeks ago. EXAM: ABDOMEN - 1 VIEW COMPARISON:  None. FINDINGS: No evidence of radiopaque foreign object. Large amount of fecal matter throughout the colon. Small bowel gas pattern is normal. No abnormal calcifications or significant bone findings. IMPRESSION: No radiopaque foreign object.  Large amount of fecal matter. Electronically Signed   By: Nelson Chimes M.D.   On: 03/23/2016 14:17   ASSESSMENT & PLAN:   82 yo with multiple medical co-morbids with   1) Normocytic Anemia - likely multifactorial. Appears to be primarily from Iron deficiency Anemia - likely from GI losses related to ongoing anticoagulation for afib and previous known small bowel and gastric AVM's and diverticulosis. No evidence of hemolysis SPEP neg Patient notes he has an increasing PSA level -followed with Dr Risa Grill  PLAN -transfuse prn for hgb,8  or if symptomatic. No overtly symptomatic at this time. -recommended Iron polysaccharide 154m po BID with orange juice. -risk vs benefits of continued anticoagulation in the setting of chronic GI bleeding -- to be determined by PCP and cardiology. -will have a low threshold to treatment patient with IV Iron if he is not responding to po iron. -discussed role of IV iron - patient is agreeable.   2)Increasing PSA - concerning for progressive prostate cancer -continue f/u with Urology - Dr GRisa Grill  3) Patient Active Problem List   Diagnosis Date Noted  . Symptomatic anemia 08/22/2016  . Iron deficiency anemia due to chronic blood loss 06/29/2016  . Melena   . Angiodysplasia of stomach and duodenum without bleeding   . GIB (gastrointestinal bleeding) 09/14/2014  . [place]15[place]  . PVC's (premature ventricular contractions) 12/05/2012  . Fatigue 12/12/2011  . Heart palpitations 01/24/2011  . CAD (coronary artery disease)   . Aortic stenosis   . Dyslipidemia   . Depression   . ANXIETY 11/06/2008  . Aortic valve disorder 11/06/2008  . CONSTIPATION 11/06/2008  . IDIOPATHIC OSTEOPOROSIS 11/06/2008  . OSA (obstructive sleep apnea) 11/06/2008  . HEMORRHOIDS 11/05/2008  . DIVERTICULOSIS, COLON 11/05/2008  . Personal history of colonic polyps 11/05/2008   -continue other medical management with PCP  All of the patients questions were answered with apparent satisfaction. The patient knows to call the clinic with any problems, questions or concerns.  I spent 45 minutes counseling the patient face to face. The total time spent in the appointment was 60 minutes and more than 50% was on counseling and direct patient cares.    GSullivan LoneMD MNorth GateAAHIVMS SHealth PointeCCalais Regional HospitalHematology/Oncology Physician [place] (Office):       [phone](  Work cell):  [phone] (Fax):           [phone]  03/29/2016 2:04 PM

## 2016-03-30 LAB — HAPTOGLOBIN: Haptoglobin: 169 mg/dL (ref 34–200)

## 2016-03-30 LAB — IRON AND TIBC
%SAT: 5 % — AB (ref 20–55)
Iron: 19 ug/dL — ABNORMAL LOW (ref 42–163)
TIBC: 406 ug/dL (ref 202–409)
UIBC: 387 ug/dL — AB (ref 117–376)

## 2016-03-30 LAB — FERRITIN: Ferritin: 11 ng/ml — ABNORMAL LOW (ref 22–316)

## 2016-03-30 LAB — KAPPA/LAMBDA LIGHT CHAINS
IG KAPPA FREE LIGHT CHAIN: 42.2 mg/L — AB (ref 3.3–19.4)
Ig Lambda Free Light Chain: 22.8 mg/L (ref 5.7–26.3)
Kappa/Lambda FluidC Ratio: 1.85 — ABNORMAL HIGH (ref 0.26–1.65)

## 2016-03-30 LAB — SEDIMENTATION RATE: SED RATE: 23 mm/h (ref 0–30)

## 2016-03-30 LAB — VITAMIN B12: Vitamin B12: 958 pg/mL — ABNORMAL HIGH (ref 211–946)

## 2016-03-31 LAB — MULTIPLE MYELOMA PANEL, SERUM
ALBUMIN/GLOB SERPL: 1.2 (ref 0.7–1.7)
ALPHA 1: 0.2 g/dL (ref 0.0–0.4)
ALPHA2 GLOB SERPL ELPH-MCNC: 0.7 g/dL (ref 0.4–1.0)
Albumin SerPl Elph-Mcnc: 3.6 g/dL (ref 2.9–4.4)
B-Globulin SerPl Elph-Mcnc: 1 g/dL (ref 0.7–1.3)
GAMMA GLOB SERPL ELPH-MCNC: 1.2 g/dL (ref 0.4–1.8)
GLOBULIN, TOTAL: 3.2 g/dL (ref 2.2–3.9)
IGM (IMMUNOGLOBIN M), SRM: 57 mg/dL (ref 15–143)
IgA, Qn, Serum: 226 mg/dL (ref 61–437)
Total Protein: 6.8 g/dL (ref 6.0–8.5)

## 2016-04-10 ENCOUNTER — Telehealth: Payer: Self-pay | Admitting: Hematology

## 2016-04-10 DIAGNOSIS — H401123 Primary open-angle glaucoma, left eye, severe stage: Secondary | ICD-10-CM | POA: Diagnosis not present

## 2016-04-10 DIAGNOSIS — H3530 Unspecified macular degeneration: Secondary | ICD-10-CM | POA: Diagnosis not present

## 2016-04-10 DIAGNOSIS — H524 Presbyopia: Secondary | ICD-10-CM | POA: Diagnosis not present

## 2016-04-10 DIAGNOSIS — H52203 Unspecified astigmatism, bilateral: Secondary | ICD-10-CM | POA: Diagnosis not present

## 2016-04-10 DIAGNOSIS — H401111 Primary open-angle glaucoma, right eye, mild stage: Secondary | ICD-10-CM | POA: Diagnosis not present

## 2016-04-10 NOTE — Telephone Encounter (Signed)
Faxed pt records to [place]. [phone]

## 2016-04-12 DIAGNOSIS — D6489 Other specified anemias: Secondary | ICD-10-CM | POA: Diagnosis not present

## 2016-04-12 DIAGNOSIS — Z7901 Long term (current) use of anticoagulants: Secondary | ICD-10-CM | POA: Diagnosis not present

## 2016-04-12 DIAGNOSIS — I480 Paroxysmal atrial fibrillation: Secondary | ICD-10-CM | POA: Diagnosis not present

## 2016-04-13 DIAGNOSIS — Z7901 Long term (current) use of anticoagulants: Secondary | ICD-10-CM | POA: Diagnosis not present

## 2016-04-13 DIAGNOSIS — D509 Iron deficiency anemia, unspecified: Secondary | ICD-10-CM | POA: Diagnosis not present

## 2016-04-13 DIAGNOSIS — D649 Anemia, unspecified: Secondary | ICD-10-CM | POA: Diagnosis not present

## 2016-04-13 DIAGNOSIS — R5383 Other fatigue: Secondary | ICD-10-CM | POA: Diagnosis not present

## 2016-04-13 DIAGNOSIS — Z6825 Body mass index (BMI) 25.0-25.9, adult: Secondary | ICD-10-CM | POA: Diagnosis not present

## 2016-04-13 DIAGNOSIS — I480 Paroxysmal atrial fibrillation: Secondary | ICD-10-CM | POA: Diagnosis not present

## 2016-04-14 ENCOUNTER — Encounter (HOSPITAL_COMMUNITY)
Admission: RE | Admit: 2016-04-14 | Discharge: 2016-04-14 | Disposition: A | Discharge status: Home / Self Care | Payer: Medicare Other | Source: Ambulatory Visit | Attending: Internal Medicine | Admitting: Internal Medicine

## 2016-04-14 DIAGNOSIS — D649 Anemia, unspecified: Secondary | ICD-10-CM | POA: Diagnosis not present

## 2016-04-14 LAB — PREPARE RBC (CROSSMATCH)

## 2016-04-14 MED ORDER — FUROSEMIDE 10 MG/ML IJ SOLN
20.0000 mg | Freq: Once | INTRAMUSCULAR | Status: AC
  Administered 2016-04-14: 20 mg via INTRAVENOUS

## 2016-04-14 MED ORDER — FUROSEMIDE 10 MG/ML IJ SOLN
INTRAMUSCULAR | Status: AC
  Filled 2016-04-14: qty 2

## 2016-04-14 MED ORDER — SODIUM CHLORIDE 0.9 % IV SOLN: Freq: Once | INTRAVENOUS | Status: DC

## 2016-04-14 MED ORDER — DIPHENHYDRAMINE HCL 25 MG PO CAPS
ORAL_CAPSULE | ORAL | Status: AC
  Administered 2016-04-14: 25 mg via ORAL
  Filled 2016-04-14: qty 1

## 2016-04-14 MED ORDER — ACETAMINOPHEN 500 MG PO TABS: 1000.0000 mg | ORAL_TABLET | Freq: Once | ORAL | Status: DC

## 2016-04-14 MED ORDER — DIPHENHYDRAMINE HCL 25 MG PO CAPS
25.0000 mg | ORAL_CAPSULE | Freq: Once | ORAL | Status: AC
Start: 2016-04-14 — End: 2016-04-14
  Administered 2016-04-14: 25 mg via ORAL

## 2016-04-15 LAB — TYPE AND SCREEN
ABO/RH(D): A NEG
Antibody Screen: NEGATIVE
UNIT DIVISION: 0
Unit division: 0

## 2016-04-17 DIAGNOSIS — C61 Malignant neoplasm of prostate: Secondary | ICD-10-CM | POA: Diagnosis not present

## 2016-04-21 DIAGNOSIS — Z7901 Long term (current) use of anticoagulants: Secondary | ICD-10-CM | POA: Diagnosis not present

## 2016-04-21 DIAGNOSIS — I480 Paroxysmal atrial fibrillation: Secondary | ICD-10-CM | POA: Diagnosis not present

## 2016-04-21 DIAGNOSIS — D6489 Other specified anemias: Secondary | ICD-10-CM | POA: Diagnosis not present

## 2016-04-24 ENCOUNTER — Other Ambulatory Visit (HOSPITAL_COMMUNITY): Payer: Self-pay | Admitting: Urology

## 2016-04-24 DIAGNOSIS — C61 Malignant neoplasm of prostate: Secondary | ICD-10-CM | POA: Diagnosis not present

## 2016-04-26 DIAGNOSIS — H35363 Drusen (degenerative) of macula, bilateral: Secondary | ICD-10-CM | POA: Diagnosis not present

## 2016-04-26 DIAGNOSIS — H353134 Nonexudative age-related macular degeneration, bilateral, advanced atrophic with subfoveal involvement: Secondary | ICD-10-CM | POA: Diagnosis not present

## 2016-04-26 DIAGNOSIS — H35361 Drusen (degenerative) of macula, right eye: Secondary | ICD-10-CM | POA: Diagnosis not present

## 2016-04-26 DIAGNOSIS — H4720 Unspecified optic atrophy: Secondary | ICD-10-CM | POA: Diagnosis not present

## 2016-05-11 ENCOUNTER — Encounter (HOSPITAL_COMMUNITY)
Admission: RE | Admit: 2016-05-11 | Discharge: 2016-05-11 | Disposition: A | Discharge status: Home / Self Care | Payer: Medicare Other | Source: Ambulatory Visit | Attending: Urology | Admitting: Urology

## 2016-05-11 DIAGNOSIS — C61 Malignant neoplasm of prostate: Secondary | ICD-10-CM | POA: Diagnosis not present

## 2016-05-11 MED ORDER — FLUDEOXYGLUCOSE F - 18 (FDG) INJECTION: 22.0000 | Freq: Once | INTRAVENOUS | Status: DC | PRN

## 2016-05-11 MED ORDER — TECHNETIUM TC 99M MEDRONATE IV KIT
25.0000 | PACK | Freq: Once | INTRAVENOUS | Status: AC | PRN
  Administered 2016-05-11: 22 via INTRAVENOUS

## 2016-05-13 DIAGNOSIS — Z23 Encounter for immunization: Secondary | ICD-10-CM | POA: Diagnosis not present

## 2016-05-22 DIAGNOSIS — D6489 Other specified anemias: Secondary | ICD-10-CM | POA: Diagnosis not present

## 2016-05-22 DIAGNOSIS — Z7901 Long term (current) use of anticoagulants: Secondary | ICD-10-CM | POA: Diagnosis not present

## 2016-06-01 DIAGNOSIS — D6489 Other specified anemias: Secondary | ICD-10-CM | POA: Diagnosis not present

## 2016-06-01 DIAGNOSIS — R5383 Other fatigue: Secondary | ICD-10-CM | POA: Diagnosis not present

## 2016-06-01 DIAGNOSIS — Z7901 Long term (current) use of anticoagulants: Secondary | ICD-10-CM | POA: Diagnosis not present

## 2016-06-15 DIAGNOSIS — D6489 Other specified anemias: Secondary | ICD-10-CM | POA: Diagnosis not present

## 2016-06-15 DIAGNOSIS — Z7901 Long term (current) use of anticoagulants: Secondary | ICD-10-CM | POA: Diagnosis not present

## 2016-06-17 ENCOUNTER — Other Ambulatory Visit: Payer: Self-pay | Admitting: Cardiology

## 2016-06-29 ENCOUNTER — Other Ambulatory Visit: Payer: Medicare Other

## 2016-06-29 ENCOUNTER — Encounter: Payer: Self-pay | Admitting: Hematology

## 2016-06-29 ENCOUNTER — Ambulatory Visit (HOSPITAL_BASED_OUTPATIENT_CLINIC_OR_DEPARTMENT_OTHER): Payer: Medicare Other

## 2016-06-29 ENCOUNTER — Telehealth: Payer: Self-pay | Admitting: Hematology

## 2016-06-29 ENCOUNTER — Telehealth: Payer: Self-pay | Admitting: *Deleted

## 2016-06-29 ENCOUNTER — Ambulatory Visit (HOSPITAL_BASED_OUTPATIENT_CLINIC_OR_DEPARTMENT_OTHER): Payer: Medicare Other | Admitting: Hematology

## 2016-06-29 VITALS — BP 129/50 | HR 94 | Temp 97.9°F | Resp 18 | Ht 71.0 in | Wt 181.2 lb

## 2016-06-29 DIAGNOSIS — I25810 Atherosclerosis of coronary artery bypass graft(s) without angina pectoris: Secondary | ICD-10-CM | POA: Diagnosis not present

## 2016-06-29 DIAGNOSIS — R972 Elevated prostate specific antigen [PSA]: Secondary | ICD-10-CM | POA: Diagnosis not present

## 2016-06-29 DIAGNOSIS — E538 Deficiency of other specified B group vitamins: Secondary | ICD-10-CM | POA: Diagnosis not present

## 2016-06-29 DIAGNOSIS — D500 Iron deficiency anemia secondary to blood loss (chronic): Secondary | ICD-10-CM

## 2016-06-29 DIAGNOSIS — D509 Iron deficiency anemia, unspecified: Secondary | ICD-10-CM | POA: Diagnosis not present

## 2016-06-29 DIAGNOSIS — D649 Anemia, unspecified: Secondary | ICD-10-CM

## 2016-06-29 LAB — CBC & DIFF AND RETIC
BASO%: 1.6 % (ref 0.0–2.0)
Basophils Absolute: 0.1 10*3/uL (ref 0.0–0.1)
EOS ABS: 0.2 10*3/uL (ref 0.0–0.5)
EOS%: 3.1 % (ref 0.0–7.0)
HEMATOCRIT: 29.3 % — AB (ref 38.4–49.9)
HEMOGLOBIN: 8.9 g/dL — AB (ref 13.0–17.1)
Immature Retic Fract: 16.2 % — ABNORMAL HIGH (ref 3.00–10.60)
LYMPH#: 1.4 10*3/uL (ref 0.9–3.3)
LYMPH%: 20.5 % (ref 14.0–49.0)
MCH: 29.6 pg (ref 27.2–33.4)
MCHC: 30.4 g/dL — ABNORMAL LOW (ref 32.0–36.0)
MCV: 97.3 fL (ref 79.3–98.0)
MONO#: 0.7 10*3/uL (ref 0.1–0.9)
MONO%: 9.5 % (ref 0.0–14.0)
NEUT%: 65.3 % (ref 39.0–75.0)
NEUTROS ABS: 4.5 10*3/uL (ref 1.5–6.5)
Platelets: 265 10*3/uL (ref 140–400)
RBC: 3.01 10*6/uL — ABNORMAL LOW (ref 4.20–5.82)
RDW: 13.9 % (ref 11.0–14.6)
RETIC %: 3.9 % — AB (ref 0.80–1.80)
RETIC CT ABS: 117.39 10*3/uL — AB (ref 34.80–93.90)
WBC: 6.8 10*3/uL (ref 4.0–10.3)

## 2016-06-29 LAB — COMPREHENSIVE METABOLIC PANEL
ALBUMIN: 3.7 g/dL (ref 3.5–5.0)
ALK PHOS: 58 U/L (ref 40–150)
ALT: 13 U/L (ref 0–55)
AST: 16 U/L (ref 5–34)
Anion Gap: 6 mEq/L (ref 3–11)
BUN: 16.3 mg/dL (ref 7.0–26.0)
CALCIUM: 8.8 mg/dL (ref 8.4–10.4)
CHLORIDE: 108 meq/L (ref 98–109)
CO2: 26 mEq/L (ref 22–29)
CREATININE: 1 mg/dL (ref 0.7–1.3)
EGFR: 64 mL/min/{1.73_m2} — ABNORMAL LOW (ref >90)
GLUCOSE: 98 mg/dL (ref 70–140)
POTASSIUM: 4.3 meq/L (ref 3.5–5.1)
SODIUM: 140 meq/L (ref 136–145)
Total Bilirubin: 0.25 mg/dL (ref 0.20–1.20)
Total Protein: 7.4 g/dL (ref 6.4–8.3)

## 2016-06-29 LAB — FERRITIN: Ferritin: 19 ng/ml — ABNORMAL LOW (ref 22–316)

## 2016-06-29 LAB — IRON AND TIBC
%SAT: 5 % — AB (ref 20–55)
Iron: 20 ug/dL — ABNORMAL LOW (ref 42–163)
TIBC: 389 ug/dL (ref 202–409)
UIBC: 369 ug/dL (ref 117–376)

## 2016-06-29 NOTE — Telephone Encounter (Signed)
Per LOS I have scheduled appts and notified the scheduler 

## 2016-06-29 NOTE — Telephone Encounter (Signed)
Message sent to infusion scheduler to add infusion, per 06/29/16 los. [name] KitchenSpoke with desk nurse, Darden Dates regarding labs not ordered for 08/10/16 visit. Ok to add lab to 08/10/16 visit and she will have M.D. To add lab orders for that day. Lab was also added for today. Appointments scheduled per 06/29/16 los. AVS report and appointment schedule given to patient, per 06/29/16 los.

## 2016-06-30 LAB — VITAMIN B12

## 2016-07-03 ENCOUNTER — Ambulatory Visit (HOSPITAL_BASED_OUTPATIENT_CLINIC_OR_DEPARTMENT_OTHER): Payer: Medicare Other

## 2016-07-03 VITALS — BP 120/61 | HR 90 | Temp 98.5°F | Resp 18

## 2016-07-03 DIAGNOSIS — D509 Iron deficiency anemia, unspecified: Secondary | ICD-10-CM

## 2016-07-03 DIAGNOSIS — D500 Iron deficiency anemia secondary to blood loss (chronic): Secondary | ICD-10-CM

## 2016-07-03 MED ORDER — SODIUM CHLORIDE 0.9 % IV SOLN
Freq: Once | INTRAVENOUS | Status: AC
  Administered 2016-07-03: 20 mL/h via INTRAVENOUS

## 2016-07-03 MED ORDER — SODIUM CHLORIDE 0.9 % IV SOLN
510.0000 mg | Freq: Once | INTRAVENOUS | Status: AC
  Administered 2016-07-03: 510 mg via INTRAVENOUS
  Filled 2016-07-03: qty 17

## 2016-07-03 NOTE — Patient Instructions (Signed)

## 2016-07-10 ENCOUNTER — Ambulatory Visit (HOSPITAL_BASED_OUTPATIENT_CLINIC_OR_DEPARTMENT_OTHER): Payer: Medicare Other

## 2016-07-10 VITALS — BP 134/75 | HR 87 | Temp 98.2°F | Resp 18

## 2016-07-10 DIAGNOSIS — D500 Iron deficiency anemia secondary to blood loss (chronic): Secondary | ICD-10-CM

## 2016-07-10 DIAGNOSIS — D509 Iron deficiency anemia, unspecified: Secondary | ICD-10-CM

## 2016-07-10 MED ORDER — SODIUM CHLORIDE 0.9 % IV SOLN
Freq: Once | INTRAVENOUS | Status: AC
  Administered 2016-07-10: 09:00:00 via INTRAVENOUS

## 2016-07-10 MED ORDER — FERUMOXYTOL INJECTION 510 MG/17 ML
510.0000 mg | Freq: Once | INTRAVENOUS | Status: AC
  Administered 2016-07-10: 510 mg via INTRAVENOUS
  Filled 2016-07-10: qty 17

## 2016-07-10 NOTE — Patient Instructions (Signed)

## 2016-07-13 DIAGNOSIS — Z7901 Long term (current) use of anticoagulants: Secondary | ICD-10-CM | POA: Diagnosis not present

## 2016-07-13 DIAGNOSIS — D6489 Other specified anemias: Secondary | ICD-10-CM | POA: Diagnosis not present

## 2016-07-17 ENCOUNTER — Ambulatory Visit (HOSPITAL_BASED_OUTPATIENT_CLINIC_OR_DEPARTMENT_OTHER): Payer: Medicare Other

## 2016-07-17 VITALS — BP 111/70 | HR 81 | Temp 98.2°F | Resp 18

## 2016-07-17 DIAGNOSIS — D500 Iron deficiency anemia secondary to blood loss (chronic): Secondary | ICD-10-CM | POA: Diagnosis not present

## 2016-07-17 MED ORDER — FERUMOXYTOL INJECTION 510 MG/17 ML
510.0000 mg | Freq: Once | INTRAVENOUS | Status: AC
  Administered 2016-07-17: 510 mg via INTRAVENOUS
  Filled 2016-07-17: qty 17

## 2016-07-17 MED ORDER — SODIUM CHLORIDE 0.9 % IV SOLN
INTRAVENOUS | Status: DC
  Administered 2016-07-17: 09:00:00 via INTRAVENOUS

## 2016-07-17 NOTE — Patient Instructions (Signed)

## 2016-07-24 DIAGNOSIS — D6489 Other specified anemias: Secondary | ICD-10-CM | POA: Diagnosis not present

## 2016-07-24 DIAGNOSIS — Z7901 Long term (current) use of anticoagulants: Secondary | ICD-10-CM | POA: Diagnosis not present

## 2016-07-24 DIAGNOSIS — M859 Disorder of bone density and structure, unspecified: Secondary | ICD-10-CM | POA: Diagnosis not present

## 2016-07-24 DIAGNOSIS — E784 Other hyperlipidemia: Secondary | ICD-10-CM | POA: Diagnosis not present

## 2016-07-24 DIAGNOSIS — R7301 Impaired fasting glucose: Secondary | ICD-10-CM | POA: Diagnosis not present

## 2016-07-24 DIAGNOSIS — Z125 Encounter for screening for malignant neoplasm of prostate: Secondary | ICD-10-CM | POA: Diagnosis not present

## 2016-07-24 DIAGNOSIS — M8580 Other specified disorders of bone density and structure, unspecified site: Secondary | ICD-10-CM | POA: Diagnosis not present

## 2016-07-24 DIAGNOSIS — E785 Hyperlipidemia, unspecified: Secondary | ICD-10-CM | POA: Diagnosis not present

## 2016-07-24 DIAGNOSIS — C61 Malignant neoplasm of prostate: Secondary | ICD-10-CM | POA: Diagnosis not present

## 2016-07-24 DIAGNOSIS — Z0000 Encounter for general adult medical examination without abnormal findings: Secondary | ICD-10-CM | POA: Diagnosis not present

## 2016-07-24 DIAGNOSIS — E538 Deficiency of other specified B group vitamins: Secondary | ICD-10-CM | POA: Diagnosis not present

## 2016-07-26 DIAGNOSIS — Z961 Presence of intraocular lens: Secondary | ICD-10-CM | POA: Diagnosis not present

## 2016-07-26 DIAGNOSIS — Z947 Corneal transplant status: Secondary | ICD-10-CM | POA: Diagnosis not present

## 2016-07-31 DIAGNOSIS — R1031 Right lower quadrant pain: Secondary | ICD-10-CM | POA: Diagnosis not present

## 2016-07-31 DIAGNOSIS — Z6826 Body mass index (BMI) 26.0-26.9, adult: Secondary | ICD-10-CM | POA: Diagnosis not present

## 2016-07-31 DIAGNOSIS — Z952 Presence of prosthetic heart valve: Secondary | ICD-10-CM | POA: Diagnosis not present

## 2016-07-31 DIAGNOSIS — Z1389 Encounter for screening for other disorder: Secondary | ICD-10-CM | POA: Diagnosis not present

## 2016-07-31 DIAGNOSIS — N401 Enlarged prostate with lower urinary tract symptoms: Secondary | ICD-10-CM | POA: Diagnosis not present

## 2016-07-31 DIAGNOSIS — C61 Malignant neoplasm of prostate: Secondary | ICD-10-CM | POA: Diagnosis not present

## 2016-07-31 DIAGNOSIS — Z7901 Long term (current) use of anticoagulants: Secondary | ICD-10-CM | POA: Diagnosis not present

## 2016-07-31 DIAGNOSIS — I480 Paroxysmal atrial fibrillation: Secondary | ICD-10-CM | POA: Diagnosis not present

## 2016-07-31 DIAGNOSIS — I7389 Other specified peripheral vascular diseases: Secondary | ICD-10-CM | POA: Diagnosis not present

## 2016-07-31 DIAGNOSIS — Z0000 Encounter for general adult medical examination without abnormal findings: Secondary | ICD-10-CM | POA: Diagnosis not present

## 2016-07-31 DIAGNOSIS — E611 Iron deficiency: Secondary | ICD-10-CM | POA: Diagnosis not present

## 2016-07-31 DIAGNOSIS — Z9989 Dependence on other enabling machines and devices: Secondary | ICD-10-CM | POA: Diagnosis not present

## 2016-08-02 ENCOUNTER — Other Ambulatory Visit: Payer: Self-pay | Admitting: Internal Medicine

## 2016-08-02 DIAGNOSIS — R1031 Right lower quadrant pain: Secondary | ICD-10-CM

## 2016-08-08 ENCOUNTER — Ambulatory Visit
Admission: RE | Admit: 2016-08-08 | Discharge: 2016-08-08 | Disposition: A | Discharge status: Home / Self Care | Payer: Medicare Other | Source: Ambulatory Visit | Attending: Internal Medicine | Admitting: Internal Medicine

## 2016-08-08 DIAGNOSIS — N200 Calculus of kidney: Secondary | ICD-10-CM | POA: Diagnosis not present

## 2016-08-08 DIAGNOSIS — R1031 Right lower quadrant pain: Secondary | ICD-10-CM

## 2016-08-08 MED ORDER — IOPAMIDOL (ISOVUE-300) INJECTION 61%
100.0000 mL | Freq: Once | INTRAVENOUS | Status: AC | PRN
  Administered 2016-08-08: 100 mL via INTRAVENOUS

## 2016-08-10 ENCOUNTER — Telehealth: Payer: Self-pay | Admitting: Hematology

## 2016-08-10 ENCOUNTER — Other Ambulatory Visit (HOSPITAL_BASED_OUTPATIENT_CLINIC_OR_DEPARTMENT_OTHER): Payer: Medicare Other

## 2016-08-10 ENCOUNTER — Ambulatory Visit (HOSPITAL_BASED_OUTPATIENT_CLINIC_OR_DEPARTMENT_OTHER): Payer: Medicare Other | Admitting: Hematology

## 2016-08-10 ENCOUNTER — Other Ambulatory Visit: Payer: Self-pay | Admitting: *Deleted

## 2016-08-10 ENCOUNTER — Encounter: Payer: Self-pay | Admitting: Hematology

## 2016-08-10 VITALS — BP 134/70 | HR 103 | Temp 98.0°F | Resp 18 | Ht 71.0 in | Wt 183.6 lb

## 2016-08-10 DIAGNOSIS — D509 Iron deficiency anemia, unspecified: Secondary | ICD-10-CM

## 2016-08-10 DIAGNOSIS — D649 Anemia, unspecified: Secondary | ICD-10-CM

## 2016-08-10 DIAGNOSIS — D500 Iron deficiency anemia secondary to blood loss (chronic): Secondary | ICD-10-CM

## 2016-08-10 DIAGNOSIS — I25810 Atherosclerosis of coronary artery bypass graft(s) without angina pectoris: Secondary | ICD-10-CM

## 2016-08-10 DIAGNOSIS — E611 Iron deficiency: Secondary | ICD-10-CM | POA: Diagnosis not present

## 2016-08-10 DIAGNOSIS — D6489 Other specified anemias: Secondary | ICD-10-CM | POA: Diagnosis not present

## 2016-08-10 LAB — CBC & DIFF AND RETIC
BASO%: 1.5 % (ref 0.0–2.0)
BASOS ABS: 0.1 10*3/uL (ref 0.0–0.1)
EOS%: 4.9 % (ref 0.0–7.0)
Eosinophils Absolute: 0.3 10*3/uL (ref 0.0–0.5)
HEMATOCRIT: 27.9 % — AB (ref 38.4–49.9)
HGB: 8.7 g/dL — ABNORMAL LOW (ref 13.0–17.1)
IMMATURE RETIC FRACT: 23.3 % — AB (ref 3.00–10.60)
LYMPH%: 14.7 % (ref 14.0–49.0)
MCH: 29.3 pg (ref 27.2–33.4)
MCHC: 31.2 g/dL — AB (ref 32.0–36.0)
MCV: 93.9 fL (ref 79.3–98.0)
MONO#: 0.7 10*3/uL (ref 0.1–0.9)
MONO%: 9.9 % (ref 0.0–14.0)
NEUT#: 4.7 10*3/uL (ref 1.5–6.5)
NEUT%: 69 % (ref 39.0–75.0)
PLATELETS: 261 10*3/uL (ref 140–400)
RBC: 2.97 10*6/uL — AB (ref 4.20–5.82)
RDW: 14.7 % — ABNORMAL HIGH (ref 11.0–14.6)
Retic %: 2.53 % — ABNORMAL HIGH (ref 0.80–1.80)
Retic Ct Abs: 75.14 10*3/uL (ref 34.80–93.90)
WBC: 6.8 10*3/uL (ref 4.0–10.3)
lymph#: 1 10*3/uL (ref 0.9–3.3)

## 2016-08-10 LAB — COMPREHENSIVE METABOLIC PANEL
ALT: 16 U/L (ref 0–55)
ANION GAP: 8 meq/L (ref 3–11)
AST: 17 U/L (ref 5–34)
Albumin: 3.6 g/dL (ref 3.5–5.0)
Alkaline Phosphatase: 63 U/L (ref 40–150)
BILIRUBIN TOTAL: 0.22 mg/dL (ref 0.20–1.20)
BUN: 15.5 mg/dL (ref 7.0–26.0)
CALCIUM: 8.9 mg/dL (ref 8.4–10.4)
CO2: 25 meq/L (ref 22–29)
CREATININE: 1.1 mg/dL (ref 0.7–1.3)
Chloride: 107 mEq/L (ref 98–109)
EGFR: 61 mL/min/{1.73_m2} — AB (ref >90)
Glucose: 97 mg/dl (ref 70–140)
Potassium: 4.3 mEq/L (ref 3.5–5.1)
Sodium: 141 mEq/L (ref 136–145)
TOTAL PROTEIN: 7.3 g/dL (ref 6.4–8.3)

## 2016-08-10 LAB — IRON AND TIBC
%SAT: 7 % — ABNORMAL LOW (ref 20–55)
IRON: 25 ug/dL — AB (ref 42–163)
TIBC: 345 ug/dL (ref 202–409)
UIBC: 320 ug/dL (ref 117–376)

## 2016-08-10 LAB — FERRITIN: FERRITIN: 56 ng/mL (ref 22–316)

## 2016-08-10 NOTE — Telephone Encounter (Signed)
Appointments scheduled per 08/10/16 los. Patient was given a copy of the AVS report and appointment schedule, per 08/10/16 los. °

## 2016-08-11 NOTE — Progress Notes (Signed)
[place] Date of Birth: 26-Mar-1926   History of Present Illness: Mr. Datema is seen today for follow up of atrial fibrillation. He is status post aortic valve replacement and coronary bypass surgery in October of 2011. He has a tissue valve prosthesis. He had a LIMA to the LAD. The RCA had 40-50% stenosis treated medically.  He has  atrial fibrillation. He was treated with rate control and anticoagulation with Xarelto. In January 2016 he was admitted with a GIB. Xarelto was held. Hbg dropped to 9.7. he did not require transfusion. 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 Since then he has been on coumadin. He reports today that in May his Hgb dropped. He was transfused.  No obvious bleeding.  He reports INRs have been therapeutic.  No chest pain or SOB. He does note more fatigue. He was started on iron infusions last month. Lives at [facility].  He has been having some RLQ abdominal pain. CT ?fecal matter but cannot rule out cecal mass. He is going to follow up with Dr. Henrene Pastor.  Current Outpatient Prescriptions on File Prior to Visit  Medication Sig Dispense Refill  . acetaminophen (TYLENOL) 500 MG tablet Take 500 mg by mouth every 6 (six) hours as needed for moderate pain.     [name] amoxicillin (AMOXIL) 500 MG capsule FOR DENTAL USED ONLY    . Calcium Carbonate-Vitamin D (CALCIUM-VITAMIN D) 500-200 MG-UNIT per tablet Take 1 tablet by mouth daily.    . Cholecalciferol 1000 UNITS capsule Take 1,000 Units by mouth daily.     . dorzolamide (TRUSOPT) 2 % ophthalmic solution PLACE 1 DROP INTO BOTH EYES 3 TIMES DAILY.  11  . fluticasone (FLONASE) 50 MCG/ACT nasal spray Place 1 spray into both nostrils as needed for allergies or rhinitis.    [name] glucosamine-chondroitin 500-400 MG tablet Take 1 tablet by mouth 2 (two) times daily.    [name] latanoprost (XALATAN) 0.005 % ophthalmic solution Place 1 drop into both eyes daily.  11  . metoprolol tartrate (LOPRESSOR) 25  MG tablet TAKE 1 TABLET BY MOUTH EVERY DAY *NEED OFFICE VISIT* 90 tablet 0  . Multiple Vitamin (MULTIVITAMIN WITH MINERALS) TABS tablet Take 1 tablet by mouth daily.    [name] omega-3 acid ethyl esters (LOVAZA) 1 G capsule Take 1 g by mouth daily.     [name] perphenazine-amitriptyline (ETRAFON/TRIAVIL) 4-25 MG TABS Take 1 tablet by mouth at bedtime.    . prednisoLONE acetate (PRED FORTE) 1 % ophthalmic suspension Place 1 drop into the right eye daily.    . ranitidine (ZANTAC) 150 MG tablet Take 150 mg by mouth daily.    . rosuvastatin (CRESTOR) 5 MG tablet Take 5 mg by mouth. Every other day    . senna-docusate (SENOKOT-S) 8.6-50 MG per tablet Take 1 tablet by mouth at bedtime.     . vitamin B-12 (CYANOCOBALAMIN) 1000 MCG tablet Take 1,000 mcg by mouth daily.    [name] warfarin (COUMADIN) 5 MG tablet Take 5 mg by mouth. AS Directed     No current facility-administered medications on file prior to visit.     No Known Allergies  Past Medical History:  Diagnosis Date  . Abnormal PSA   . Adenomatous polyp of colon 2010 and before 2006  . Anemia   . Aortic stenosis   . Aortic valve prosthesis present   . Atrial fibrillation ([place])   . CAD (coronary artery disease)   . DDD (  degenerative disc disease), lumbar   . Depression   . Diverticulosis   . Dyslipidemia   . GERD (gastroesophageal reflux disease)   . Glaucoma   . Hx of CABG   . Hypertension   . Hypogonadism, male   . IFG (impaired fasting glucose)   . Macular degeneration   . OSA (obstructive sleep apnea)   . Osteoarthritis   . Osteopenia   . Prostate CA [facility])     Past Surgical History:  Procedure Laterality Date  . AORTIC VALVE REPLACEMENT  October 2011   Magna Ease pericardial tissue valve #35mm  . BACK SURGERY    . CATARACT EXTRACTION    . CORNEAL TRANSPLANT    . CORONARY ARTERY BYPASS GRAFT  October 2011   LIMA to LAD  . ESOPHAGOGASTRODUODENOSCOPY N/A 09/16/2014   Procedure: ESOPHAGOGASTRODUODENOSCOPY (EGD);  Surgeon: [name], MD;  Location: [facility] ENDOSCOPY;  Service: Endoscopy;  Laterality: N/A;  . TONSILLECTOMY      History  Smoking Status  . Former Smoker  . Packs/day: 1.00  . Years: 40.00  . Types: Cigarettes  . Quit date: 11/16/1990  Smokeless Tobacco  . Never Used    History  Alcohol Use  . 1.2 oz/week  . 2 Glasses of wine per week    Family History  Problem Relation Age of Onset  . Heart attack Father     Review of Systems: As noted in history of present illness. s All other systems were reviewed and are negative.  Physical Exam: BP 120/62   Pulse 88   Ht 5' 10.5" (1.791 m)   Wt 183 lb (83 kg)   BMI 25.89 kg/m  Patient is very pleasant and in no acute distress. HEENT is unremarkable. Normocephalic/atraumatic. PERRL. Sclera are nonicteric. Neck is supple. No masses. No JVD. Lungs are clear. Cardiac exam shows an irregularly irregular rate and rhythm.  He has a soft 1-2/6 aortic outflow murmur.   Abdomen is soft. Extremities are without edema. Gait and ROM are intact. No gross neurologic deficits noted.  LABORATORY DATA:    Labs dated 07/02/15: cholesterol 97, triglycerides 176, LDL 30, HDL 32.  Dated 01/12/16: A1c 4.7% Dated 08/10/16; BUN 15.5, creatinine 1.1. Other CMET normal. Most recent Hgb 9.7  Assessment / Plan: 1. Status post tissue aortic valve replacement for severe aortic stenosis. Clinically doing well. Good valve function by Echo November 2015.  2. Coronary disease status post CABG with an LIMA graft to the LAD in October of 2011. He is asymptomatic.  3. Dyslipidemia well controlled on Crestor and fish oil.   4. PVCs. These are chronic. Asymptomatic.  5. Atrial fibrillation. Chronic. Asymptomatic. Rate controlled on metoprolol. He has a [place] vasc score of 4 placing him at higher risk of CVA. History of GIB on Xarelto. Now on coumadin with some drop in Hgb noted. Target INR 2-2.5. On iron infusions now. Will see what Dr. Henrene Pastor says.  If he should have recurrent  bleeding or anemia that cannot be controlled would need to consider stopping coumadin and consider him for a LA occlusive device (Wathcman).  6. GIB. 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 At his age and with history of AV disease he is at higher risk of other AVMs.   I will see in 6 months.

## 2016-08-14 ENCOUNTER — Encounter: Payer: Self-pay | Admitting: Cardiology

## 2016-08-14 ENCOUNTER — Ambulatory Visit (INDEPENDENT_AMBULATORY_CARE_PROVIDER_SITE_OTHER): Payer: Medicare Other | Admitting: Cardiology

## 2016-08-14 VITALS — BP 120/62 | HR 88 | Ht 70.5 in | Wt 183.0 lb

## 2016-08-14 DIAGNOSIS — I25810 Atherosclerosis of coronary artery bypass graft(s) without angina pectoris: Secondary | ICD-10-CM | POA: Diagnosis not present

## 2016-08-14 DIAGNOSIS — I359 Nonrheumatic aortic valve disorder, unspecified: Secondary | ICD-10-CM | POA: Diagnosis not present

## 2016-08-14 DIAGNOSIS — I482 Chronic atrial fibrillation, unspecified: Secondary | ICD-10-CM

## 2016-08-14 DIAGNOSIS — K31811 Angiodysplasia of stomach and duodenum with bleeding: Secondary | ICD-10-CM | POA: Diagnosis not present

## 2016-08-14 NOTE — Patient Instructions (Signed)
Continue your current therapy  I will see you in 6 months.   

## 2016-08-22 ENCOUNTER — Encounter (HOSPITAL_COMMUNITY): Payer: Self-pay

## 2016-08-22 ENCOUNTER — Observation Stay (HOSPITAL_COMMUNITY)
Admission: EM | Admit: 2016-08-22 | Discharge: 2016-08-23 | Disposition: A | Discharge status: Home / Self Care | Payer: Medicare Other | Attending: Internal Medicine | Admitting: Internal Medicine

## 2016-08-22 DIAGNOSIS — D6489 Other specified anemias: Secondary | ICD-10-CM | POA: Diagnosis not present

## 2016-08-22 DIAGNOSIS — G4733 Obstructive sleep apnea (adult) (pediatric): Secondary | ICD-10-CM | POA: Diagnosis not present

## 2016-08-22 DIAGNOSIS — Z87891 Personal history of nicotine dependence: Secondary | ICD-10-CM | POA: Insufficient documentation

## 2016-08-22 DIAGNOSIS — K31811 Angiodysplasia of stomach and duodenum with bleeding: Secondary | ICD-10-CM | POA: Insufficient documentation

## 2016-08-22 DIAGNOSIS — Z951 Presence of aortocoronary bypass graft: Secondary | ICD-10-CM | POA: Diagnosis not present

## 2016-08-22 DIAGNOSIS — I482 Chronic atrial fibrillation, unspecified: Secondary | ICD-10-CM | POA: Diagnosis present

## 2016-08-22 DIAGNOSIS — K219 Gastro-esophageal reflux disease without esophagitis: Secondary | ICD-10-CM | POA: Insufficient documentation

## 2016-08-22 DIAGNOSIS — E785 Hyperlipidemia, unspecified: Secondary | ICD-10-CM | POA: Insufficient documentation

## 2016-08-22 DIAGNOSIS — Z947 Corneal transplant status: Secondary | ICD-10-CM | POA: Insufficient documentation

## 2016-08-22 DIAGNOSIS — H409 Unspecified glaucoma: Secondary | ICD-10-CM | POA: Diagnosis not present

## 2016-08-22 DIAGNOSIS — Z953 Presence of xenogenic heart valve: Secondary | ICD-10-CM | POA: Diagnosis not present

## 2016-08-22 DIAGNOSIS — F329 Major depressive disorder, single episode, unspecified: Secondary | ICD-10-CM | POA: Insufficient documentation

## 2016-08-22 DIAGNOSIS — I4891 Unspecified atrial fibrillation: Secondary | ICD-10-CM | POA: Insufficient documentation

## 2016-08-22 DIAGNOSIS — F32A Depression, unspecified: Secondary | ICD-10-CM | POA: Diagnosis present

## 2016-08-22 DIAGNOSIS — I10 Essential (primary) hypertension: Secondary | ICD-10-CM | POA: Insufficient documentation

## 2016-08-22 DIAGNOSIS — K922 Gastrointestinal hemorrhage, unspecified: Secondary | ICD-10-CM

## 2016-08-22 DIAGNOSIS — Z7901 Long term (current) use of anticoagulants: Secondary | ICD-10-CM | POA: Insufficient documentation

## 2016-08-22 DIAGNOSIS — Z8546 Personal history of malignant neoplasm of prostate: Secondary | ICD-10-CM | POA: Diagnosis not present

## 2016-08-22 DIAGNOSIS — Z79899 Other long term (current) drug therapy: Secondary | ICD-10-CM | POA: Diagnosis not present

## 2016-08-22 DIAGNOSIS — D649 Anemia, unspecified: Secondary | ICD-10-CM | POA: Diagnosis not present

## 2016-08-22 DIAGNOSIS — I350 Nonrheumatic aortic (valve) stenosis: Secondary | ICD-10-CM

## 2016-08-22 DIAGNOSIS — I2510 Atherosclerotic heart disease of native coronary artery without angina pectoris: Secondary | ICD-10-CM | POA: Insufficient documentation

## 2016-08-22 LAB — CBC
HEMATOCRIT: 24.4 % — AB (ref 39.0–52.0)
HEMATOCRIT: 24.9 % — AB (ref 39.0–52.0)
HEMOGLOBIN: 7.6 g/dL — AB (ref 13.0–17.0)
HEMOGLOBIN: 7.6 g/dL — AB (ref 13.0–17.0)
MCH: 27.5 pg (ref 26.0–34.0)
MCH: 26.7 pg (ref 26.0–34.0)
MCHC: 31.1 g/dL (ref 30.0–36.0)
MCHC: 30.5 g/dL (ref 30.0–36.0)
MCV: 88.4 fL (ref 78.0–100.0)
MCV: 87.4 fL (ref 78.0–100.0)
PLATELETS: 327 10*3/uL (ref 150–400)
Platelets: 310 10*3/uL (ref 150–400)
RBC: 2.76 MIL/uL — AB (ref 4.22–5.81)
RBC: 2.85 MIL/uL — AB (ref 4.22–5.81)
RDW: 15.6 % — ABNORMAL HIGH (ref 11.5–15.5)
RDW: 15.4 % (ref 11.5–15.5)
WBC: 7.1 10*3/uL (ref 4.0–10.5)
WBC: 7.8 10*3/uL (ref 4.0–10.5)

## 2016-08-22 LAB — PROTIME-INR
INR: 2.29
Prothrombin Time: 25.6 seconds — ABNORMAL HIGH (ref 11.4–15.2)

## 2016-08-22 LAB — IRON AND TIBC
IRON: 11 ug/dL — AB (ref 45–182)
SATURATION RATIOS: 3 % — AB (ref 17.9–39.5)
TIBC: 421 ug/dL (ref 250–450)
UIBC: 410 ug/dL

## 2016-08-22 LAB — COMPREHENSIVE METABOLIC PANEL
ALBUMIN: 3.7 g/dL (ref 3.5–5.0)
ALK PHOS: 47 U/L (ref 38–126)
ALT: 14 U/L — ABNORMAL LOW (ref 17–63)
ANION GAP: 6 (ref 5–15)
AST: 20 U/L (ref 15–41)
BILIRUBIN TOTAL: 0.2 mg/dL — AB (ref 0.3–1.2)
BUN: 14 mg/dL (ref 6–20)
CALCIUM: 8.9 mg/dL (ref 8.9–10.3)
CO2: 26 mmol/L (ref 22–32)
Chloride: 107 mmol/L (ref 101–111)
Creatinine, Ser: 1 mg/dL (ref 0.61–1.24)
GFR calc non Af Amer: >60 mL/min (ref >60)
GLUCOSE: 136 mg/dL — AB (ref 65–99)
Potassium: 4 mmol/L (ref 3.5–5.1)
SODIUM: 139 mmol/L (ref 135–145)
TOTAL PROTEIN: 7.1 g/dL (ref 6.5–8.1)

## 2016-08-22 LAB — I-STAT TROPONIN, ED: Troponin i, poc: 0.03 ng/mL (ref 0.00–0.08)

## 2016-08-22 LAB — PREPARE RBC (CROSSMATCH)

## 2016-08-22 LAB — RETICULOCYTES
RBC.: 2.83 MIL/uL — ABNORMAL LOW (ref 4.22–5.81)
Retic Count, Absolute: 67.9 10*3/uL (ref 19.0–186.0)
Retic Ct Pct: 2.4 % (ref 0.4–3.1)

## 2016-08-22 LAB — VITAMIN B12: VITAMIN B 12: 1072 pg/mL — AB (ref 180–914)

## 2016-08-22 LAB — POC OCCULT BLOOD, ED: FECAL OCCULT BLD: POSITIVE — AB

## 2016-08-22 LAB — FERRITIN: Ferritin: 14 ng/mL — ABNORMAL LOW (ref 24–336)

## 2016-08-22 MED ORDER — SENNOSIDES-DOCUSATE SODIUM 8.6-50 MG PO TABS
1.0000 | ORAL_TABLET | Freq: Every day | ORAL | Status: DC
  Administered 2016-08-22: 1 via ORAL
  Filled 2016-08-22: qty 1

## 2016-08-22 MED ORDER — OMEGA-3-ACID ETHYL ESTERS 1 G PO CAPS
1.0000 g | ORAL_CAPSULE | Freq: Every day | ORAL | Status: DC
  Administered 2016-08-23: 1 g via ORAL
  Filled 2016-08-22: qty 1

## 2016-08-22 MED ORDER — PREDNISOLONE ACETATE 1 % OP SUSP
1.0000 [drp] | Freq: Every day | OPHTHALMIC | Status: DC
  Administered 2016-08-23: 1 [drp] via OPHTHALMIC
  Filled 2016-08-22: qty 1

## 2016-08-22 MED ORDER — FLUTICASONE PROPIONATE 50 MCG/ACT NA SUSP: 1.0000 | Freq: Every day | NASAL | Status: DC | PRN

## 2016-08-22 MED ORDER — CALCIUM CARBONATE-VITAMIN D 500-200 MG-UNIT PO TABS
1.0000 | ORAL_TABLET | Freq: Every day | ORAL | Status: DC
  Administered 2016-08-23: 1 via ORAL
  Filled 2016-08-22: qty 1

## 2016-08-22 MED ORDER — VITAMIN D 1000 UNITS PO TABS
1000.0000 [IU] | ORAL_TABLET | Freq: Every day | ORAL | Status: DC
  Administered 2016-08-23: 1000 [IU] via ORAL
  Filled 2016-08-22: qty 1

## 2016-08-22 MED ORDER — LATANOPROST 0.005 % OP SOLN
1.0000 [drp] | Freq: Every day | OPHTHALMIC | Status: DC
  Administered 2016-08-22: 1 [drp] via OPHTHALMIC
  Filled 2016-08-22: qty 2.5

## 2016-08-22 MED ORDER — SODIUM CHLORIDE 0.9 % IV SOLN
80.0000 mg | Freq: Once | INTRAVENOUS | Status: AC
  Administered 2016-08-22: 23:00:00 80 mg via INTRAVENOUS
  Filled 2016-08-22: qty 80

## 2016-08-22 MED ORDER — ZOLPIDEM TARTRATE 5 MG PO TABS: 5.0000 mg | ORAL_TABLET | Freq: Every evening | ORAL | Status: DC | PRN

## 2016-08-22 MED ORDER — SODIUM CHLORIDE 0.9 % IV SOLN
8.0000 mg/h | INTRAVENOUS | Status: DC
  Administered 2016-08-22 – 2016-08-23 (×2): 8 mg/h via INTRAVENOUS
  Filled 2016-08-22 (×4): qty 80

## 2016-08-22 MED ORDER — SODIUM CHLORIDE 0.9 % IV SOLN
10.0000 mL/h | Freq: Once | INTRAVENOUS | Status: AC
  Administered 2016-08-22: 10 mL/h via INTRAVENOUS

## 2016-08-22 MED ORDER — VITAMIN B-12 1000 MCG PO TABS
1000.0000 ug | ORAL_TABLET | Freq: Every day | ORAL | Status: DC
  Administered 2016-08-23: 1000 ug via ORAL
  Filled 2016-08-22: qty 1

## 2016-08-22 MED ORDER — SODIUM CHLORIDE 0.9% FLUSH
3.0000 mL | Freq: Two times a day (BID) | INTRAVENOUS | Status: DC
  Administered 2016-08-22: 3 mL via INTRAVENOUS

## 2016-08-22 MED ORDER — DORZOLAMIDE HCL 2 % OP SOLN
1.0000 [drp] | Freq: Three times a day (TID) | OPHTHALMIC | Status: DC
  Administered 2016-08-23 (×2): 1 [drp] via OPHTHALMIC
  Filled 2016-08-22: qty 10

## 2016-08-22 MED ORDER — ROSUVASTATIN CALCIUM 5 MG PO TABS: 5.0000 mg | ORAL_TABLET | ORAL | Status: DC

## 2016-08-22 MED ORDER — METOPROLOL TARTRATE 25 MG PO TABS
25.0000 mg | ORAL_TABLET | Freq: Every day | ORAL | Status: DC
  Administered 2016-08-23: 25 mg via ORAL
  Filled 2016-08-22: qty 1

## 2016-08-22 MED ORDER — GLUCOSAMINE-CHONDROITIN 500-400 MG PO TABS
1.0000 | ORAL_TABLET | Freq: Two times a day (BID) | ORAL | Status: DC
Start: 2016-08-22 — End: 2016-08-22

## 2016-08-22 MED ORDER — ADULT MULTIVITAMIN W/MINERALS CH
1.0000 | ORAL_TABLET | Freq: Every day | ORAL | Status: DC
  Administered 2016-08-23: 1 via ORAL
  Filled 2016-08-22: qty 1

## 2016-08-22 MED ORDER — PERPHENAZINE-AMITRIPTYLINE 4-25 MG PO TABS
1.0000 | ORAL_TABLET | Freq: Every day | ORAL | Status: DC
  Administered 2016-08-22: 1 via ORAL
  Filled 2016-08-22: qty 1

## 2016-08-22 MED ORDER — SODIUM CHLORIDE 0.9 % IV SOLN
INTRAVENOUS | Status: DC
  Administered 2016-08-22: 23:00:00 via INTRAVENOUS
  Administered 2016-08-23: 1000 mL via INTRAVENOUS

## 2016-08-22 MED ORDER — ACETAMINOPHEN 500 MG PO TABS
500.0000 mg | ORAL_TABLET | Freq: Four times a day (QID) | ORAL | Status: DC | PRN
  Administered 2016-08-23: 500 mg via ORAL
  Filled 2016-08-22: qty 1

## 2016-08-22 NOTE — ED Notes (Signed)
Unsuccessful attempt at IV access. 

## 2016-08-22 NOTE — ED Triage Notes (Signed)
Pt presents for evaluation of low Hgb. Pt. Reports being SOB/fatigued. States saw PCP today for blood work and was called with results of low Hgb at 7.6 Pt AxO x4, ambulatory on arrival.

## 2016-08-22 NOTE — Progress Notes (Signed)
PHARMACIST - PHYSICIAN ORDER COMMUNICATION  CONCERNING: P&T Medication Policy on Herbal Medications  DESCRIPTION:  This patient's order for:  Glucosamine-chondroitin  has been noted.  This product(s) is classified as an "herbal" or natural product. Due to a lack of definitive safety studies or FDA approval, nonstandard manufacturing practices, plus the potential risk of unknown drug-drug interactions while on inpatient medications, the Pharmacy and Therapeutics Committee does not permit the use of "herbal" or natural products of this type within [facility].   ACTION TAKEN: The pharmacy department is unable to verify this order at this time and your patient has been informed of this safety policy. Please reevaluate patient's clinical condition at discharge and address if the herbal or natural product(s) should be resumed at that time.  Sherlon Handing, PharmD, BCPS 08/22/2016 10:28 PM

## 2016-08-22 NOTE — ED Provider Notes (Signed)
[facility] DEPT Provider Note   CSN: [id number] Arrival date & time: 08/22/16  1505     History   Chief Complaint Chief Complaint  Patient presents with  . Abnormal Lab    HPI ALEXSIS MAURER is a 82 y.o. male.  HPI   Pt is 82 yo male with hisotyr of afib on coumadin. He was treated with rate control and anticoagulation with Xarelto. In January 2016 he was admitted with a GIB on xarelto - Hbg dropped to 9.7. he did not require transfusion. 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 Since then he has been on coumadin. In May his Hgb dropped. He was transfused.  No obvious bleeding.  He reports INRs have been therapeutic.  He was started on iron infusions last one in November. Hgb continues to drop. RLQ as well.  Dizziness with movement.     Past Medical History:  Diagnosis Date  . Abnormal PSA   . Adenomatous polyp of colon 2010 and before 2006  . Anemia   . Aortic stenosis   . Aortic valve prosthesis present   . Atrial fibrillation ([place])   . CAD (coronary artery disease)   . DDD (degenerative disc disease), lumbar   . Depression   . Diverticulosis   . Dyslipidemia   . GERD (gastroesophageal reflux disease)   . Glaucoma   . Hx of CABG   . Hypertension   . Hypogonadism, male   . IFG (impaired fasting glucose)   . Macular degeneration   . OSA (obstructive sleep apnea)   . Osteoarthritis   . Osteopenia   . Prostate CA [facility])     Patient Active Problem List   Diagnosis Date Noted  . Iron deficiency anemia due to chronic blood loss 06/29/2016  . Melena   . Angiodysplasia of stomach and duodenum without bleeding   . GIB (gastrointestinal bleeding) 09/14/2014  . Atrial fibrillation ([place]) 07/15/2014  . PVC's (premature ventricular contractions) 12/05/2012  . Fatigue 12/12/2011  . Heart palpitations 01/24/2011  . CAD (coronary artery disease)   . Aortic stenosis   . Dyslipidemia   . Depression   . ANXIETY 11/06/2008  . Aortic  valve disorder 11/06/2008  . CONSTIPATION 11/06/2008  . IDIOPATHIC OSTEOPOROSIS 11/06/2008  . SLEEP APNEA 11/06/2008  . HEMORRHOIDS 11/05/2008  . DIVERTICULOSIS, COLON 11/05/2008  . Personal history of colonic polyps 11/05/2008    Past Surgical History:  Procedure Laterality Date  . AORTIC VALVE REPLACEMENT  October 2011   Magna Ease pericardial tissue valve #60mm  . BACK SURGERY    . CATARACT EXTRACTION    . CORNEAL TRANSPLANT    . CORONARY ARTERY BYPASS GRAFT  October 2011   LIMA to LAD  . ESOPHAGOGASTRODUODENOSCOPY N/A 09/16/2014   Procedure: ESOPHAGOGASTRODUODENOSCOPY (EGD);  Surgeon: [name], MD;  Location: [facility] ENDOSCOPY;  Service: Endoscopy;  Laterality: N/A;  . TONSILLECTOMY         Home Medications    Prior to Admission medications   Medication Sig Start Date End Date Taking? Authorizing Provider  acetaminophen (TYLENOL) 500 MG tablet Take 500 mg by mouth every 6 (six) hours as needed for moderate pain.     Historical Provider, MD  amoxicillin (AMOXIL) 500 MG capsule FOR DENTAL USED ONLY 01/26/14   Historical Provider, MD  Calcium Carbonate-Vitamin D (CALCIUM-VITAMIN D) 500-200 MG-UNIT per tablet Take 1 tablet by mouth daily.    Historical Provider, MD  Cholecalciferol 1000 UNITS capsule Take 1,000  Units by mouth daily.     Historical Provider, MD  dorzolamide (TRUSOPT) 2 % ophthalmic solution PLACE 1 DROP INTO BOTH EYES 3 TIMES DAILY. 11/03/15   Historical Provider, MD  fluticasone (FLONASE) 50 MCG/ACT nasal spray Place 1 spray into both nostrils as needed for allergies or rhinitis.    Historical Provider, MD  glucosamine-chondroitin 500-400 MG tablet Take 1 tablet by mouth 2 (two) times daily.    Historical Provider, MD  latanoprost (XALATAN) 0.005 % ophthalmic solution Place 1 drop into both eyes daily. 05/16/15   Historical Provider, MD  metoprolol tartrate (LOPRESSOR) 25 MG tablet TAKE 1 TABLET BY MOUTH EVERY DAY *NEED OFFICE VISIT* 06/19/16   [name], MD    Multiple Vitamin (MULTIVITAMIN WITH MINERALS) TABS tablet Take 1 tablet by mouth daily.    Historical Provider, MD  omega-3 acid ethyl esters (LOVAZA) 1 G capsule Take 1 g by mouth daily.     Historical Provider, MD  perphenazine-amitriptyline (ETRAFON/TRIAVIL) 4-25 MG TABS Take 1 tablet by mouth at bedtime.    Historical Provider, MD  prednisoLONE acetate (PRED FORTE) 1 % ophthalmic suspension Place 1 drop into the right eye daily.    Historical Provider, MD  ranitidine (ZANTAC) 150 MG tablet Take 150 mg by mouth daily.    Historical Provider, MD  rosuvastatin (CRESTOR) 5 MG tablet Take 5 mg by mouth. Every other day    Historical Provider, MD  senna-docusate (SENOKOT-S) 8.6-50 MG per tablet Take 1 tablet by mouth at bedtime.     Historical Provider, MD  vitamin B-12 (CYANOCOBALAMIN) 1000 MCG tablet Take 1,000 mcg by mouth daily.    Historical Provider, MD  warfarin (COUMADIN) 5 MG tablet Take 5 mg by mouth. AS Directed    Historical Provider, MD    Family History Family History  Problem Relation Age of Onset  . Heart attack Father     Social History Social History  Substance Use Topics  . Smoking status: Former Smoker    Packs/day: 1.00    Years: 40.00    Types: Cigarettes    Quit date: 11/16/1990  . Smokeless tobacco: Never Used  . Alcohol use 1.2 oz/week    2 Glasses of wine per week     Allergies   Patient has no known allergies.   Review of Systems Review of Systems  Constitutional: Positive for fatigue. Negative for fever.  Respiratory: Negative for chest tightness.   Cardiovascular: Negative for chest pain.  Neurological: Positive for dizziness, weakness and light-headedness.     Physical Exam Updated Vital Signs BP 132/64   Pulse 94   Temp 98.7 F (37.1 C) (Oral)   Resp 18   SpO2 100%   Physical Exam  Constitutional: He is oriented to person, place, and time. He appears well-nourished.  HENT:  Head: Normocephalic.  Eyes: Conjunctivae are normal.   Cardiovascular:  afib  Pulmonary/Chest: Effort normal and breath sounds normal. No respiratory distress. He has no wheezes.  Neurological: He is oriented to person, place, and time.  Skin: Skin is warm and dry. He is not diaphoretic.  Psychiatric: He has a normal mood and affect. His behavior is normal.     ED Treatments / Results  Labs (all labs ordered are listed, but only abnormal results are displayed) Labs Reviewed  COMPREHENSIVE METABOLIC PANEL - Abnormal; Notable for the following:       Result Value   Glucose, Bld 136 (*)    ALT 14 (*)  Total Bilirubin 0.2 (*)    All other components within normal limits  CBC - Abnormal; Notable for the following:    RBC 2.76 (*)    Hemoglobin 7.6 (*)    HCT 24.4 (*)    RDW 15.6 (*)    All other components within normal limits  POC OCCULT BLOOD, ED  TYPE AND SCREEN    EKG  EKG Interpretation None       Radiology No results found.  Procedures Procedures (including critical care time)  Medications Ordered in ED Medications - No data to display   Initial Impression / Assessment and Plan / ED Course  I have reviewed the triage vital signs and the nursing notes.  Pertinent labs & imaging results that were available during my care of the patient were reviewed by me and considered in my medical decision making (see chart for details).  Clinical Course    Patient is an 82 year old male that has had anemia over the course last year. He is on several toe and now Coumadin for A. fib. Patient has been on iron infusions, has had transfusions in the past. Patient's hemoglobin is been watched by PCP. It's been decreasing over the last month. Patient has noted dark school stools. Patient has had AVM in the past. He follows with Dr. Leslee Home from [facility] gastroenterology. Patient has pain to the right lower quadrant. CT done recently.  We'll need to admit for transfusion,  GI consult.  CRITICAL CARE Performed by: Gardiner Sleeper Total critical care time: 30 minutes Critical care time was exclusive of separately billable procedures and treating other patients. Critical care was necessary to treat or prevent imminent or life-threatening deterioration. Critical care was time spent personally by me on the following activities: development of treatment plan with patient and/or surrogate as well as nursing, discussions with consultants, evaluation of patient's response to treatment, examination of patient, obtaining history from patient or surrogate, ordering and performing treatments and interventions, ordering and review of laboratory studies, ordering and review of radiographic studies, pulse oximetry and re-evaluation of patient's condition.   Final Clinical Impressions(s) / ED Diagnoses   Final diagnoses:  None    New Prescriptions New Prescriptions   No medications on file     Kierstyn Baranowski Julio Alm, MD 08/25/16 1501

## 2016-08-22 NOTE — H&P (Signed)
History and Physical    Jason [name] [id number] DOB: 1926/04/23 DOA: 08/22/2016  Referring MD/NP/PA:   PCP: Jerlyn Ly, MD   Patient coming from:  The patient is coming from home.  At baseline, pt is independent for most of ADL.  Chief Complaint: Fatigue, shortness of breath and low hemoglobin  HPI: Jason [name] is a 82 y.o. 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  pt states that she has been having fatigue and mild shortness of breath in the past several days. He was seen by PCP, and had CBC checked today. He was found to have hemoglobin drop from 8.7 on 08/10/16-->7.6. Ptt denies rectal bleeding, no nausea, vomiting, abdominal pain, diarrhea. Denies chest pain, cough, fever or chills. No symptoms of UTI or unilateral weakness.  ED Course: pt was found to have hemoglobin 7.6, positive FOBT, electrolytes and renal function okay, temperature normal, heart rate 90s, oxygen saturation 98% on room air. Pt is placed on tele bed for obs.  # CT abdomen/pelvis showed nNonobstructing left renal stone; diverticulosis without diverticulitis; fullness in the cecum which may represent fecal material although  the possibility of an underlying mass could not be totally excluded.  Review of Systems:   General: no fevers, chills, no changes in body weight, has fatigue HEENT: no blurry vision, hearing changes or sore throat Respiratory: has dyspnea, no coughing, wheezing CV: no chest pain, no palpitations GI: no nausea, vomiting, abdominal pain, diarrhea, constipation GU: no dysuria, burning on urination, increased urinary frequency, hematuria  Ext: no leg edema Neuro: no unilateral weakness,  numbness, or tingling, no vision change or hearing loss Skin: no rash, no skin tear. MSK: No muscle spasm, no deformity, no limitation of range of movement in spin Heme: No easy bruising.  Travel history: No recent long distant travel.  Allergy: No Known Allergies  Past Medical History:  Diagnosis Date  . Abnormal PSA   . Adenomatous polyp of colon 2010 and before 2006  . Anemia   . Aortic stenosis   . Aortic valve prosthesis present   . Atrial fibrillation ([place])   . CAD (coronary artery disease)   . DDD (degenerative disc disease), lumbar   . Depression   . Diverticulosis   . Dyslipidemia   . GERD (gastroesophageal reflux disease)   . Glaucoma   . Hx of CABG   . Hypertension   . Hypogonadism, male   . IFG (impaired fasting glucose)   . Macular degeneration   . OSA (obstructive sleep apnea)   . Osteoarthritis   . Osteopenia   . Prostate CA [facility])     Past Surgical History:  Procedure Laterality Date  . AORTIC VALVE REPLACEMENT  October 2011   Magna Ease pericardial tissue valve #37mm  . BACK SURGERY    . CATARACT EXTRACTION    . CORNEAL TRANSPLANT    . CORONARY ARTERY BYPASS GRAFT  October 2011   LIMA to LAD  . ESOPHAGOGASTRODUODENOSCOPY N/A 09/16/2014   Procedure: ESOPHAGOGASTRODUODENOSCOPY (EGD);  Surgeon: [name], MD;  [facility] ENDOSCOPY;  Service: Endoscopy;  Laterality: N/A;  . TONSILLECTOMY      Social History:  reports that he quit smoking about 25 years ago. His smoking use included  Cigarettes. He has a 40.00 pack-year smoking history. He has never used smokeless tobacco. He reports that he drinks about 1.2 oz of alcohol per week . He reports that he does not use drugs.  Family History:  Family History  Problem Relation Age of Onset  . Heart attack Father      Prior to Admission medications   Medication Sig Start Date End Date Taking? Authorizing Provider  acetaminophen (TYLENOL) 500 MG tablet Take 500 mg by mouth every 6 (six) hours as needed  for moderate pain.    Yes Historical Provider, MD  amoxicillin (AMOXIL) 500 MG capsule FOR DENTAL USED ONLY 01/26/14  Yes Historical Provider, MD  Calcium Carbonate-Vitamin D (CALCIUM-VITAMIN D) 500-200 MG-UNIT per tablet Take 1 tablet by mouth daily.   Yes Historical Provider, MD  Cholecalciferol 1000 UNITS capsule Take 1,000 Units by mouth daily.    Yes Historical Provider, MD  dorzolamide (TRUSOPT) 2 % ophthalmic solution PLACE 1 DROP INTO BOTH EYES 3 TIMES DAILY. 11/03/15  Yes Historical Provider, MD  fluticasone (FLONASE) 50 MCG/ACT nasal spray Place 1 spray into both nostrils as needed for allergies or rhinitis.   Yes Historical Provider, MD  glucosamine-chondroitin 500-400 MG tablet Take 1 tablet by mouth 2 (two) times daily.   Yes Historical Provider, MD  latanoprost (XALATAN) 0.005 % ophthalmic solution Place 1 drop into both eyes daily. 05/16/15  Yes Historical Provider, MD  metoprolol tartrate (LOPRESSOR) 25 MG tablet TAKE 1 TABLET BY MOUTH EVERY DAY *NEED OFFICE VISIT* 06/19/16  Yes [name], MD  Multiple Vitamin (MULTIVITAMIN WITH MINERALS) TABS tablet Take 1 tablet by mouth daily.   Yes Historical Provider, MD  NON FORMULARY Inhale 1 application into the lungs at bedtime. CPAP for apnea   Yes Historical Provider, MD  omega-3 acid ethyl esters (LOVAZA) 1 G capsule Take 1 g by mouth daily.    Yes Historical Provider, MD  perphenazine-amitriptyline (ETRAFON/TRIAVIL) 4-25 MG TABS Take 1 tablet by mouth at bedtime.   Yes Historical Provider, MD  prednisoLONE acetate (PRED FORTE) 1 % ophthalmic suspension Place 1 drop into the right eye daily.   Yes Historical Provider, MD  ranitidine (ZANTAC) 150 MG tablet Take 150 mg by mouth daily.   Yes Historical Provider, MD  rosuvastatin (CRESTOR) 5 MG tablet Take 5 mg by mouth every other day. Every other day    Yes Historical Provider, MD  senna-docusate (SENOKOT-S) 8.6-50 MG per tablet Take 1 tablet by mouth at bedtime.    Yes Historical Provider,  MD  vitamin B-12 (CYANOCOBALAMIN) 1000 MCG tablet Take 1,000 mcg by mouth daily.   Yes Historical Provider, MD  warfarin (COUMADIN) 5 MG tablet Take 2.5-5 mg by mouth daily at 6 PM. Takes 1/2 tab on mon Takes 1 tab all other days   Yes Historical Provider, MD    Physical Exam: Vitals:   08/22/16 1930 08/22/16 1945 08/22/16 2000 08/22/16 2015  BP: 145/82 140/84 136/73 155/79  Pulse:  88 85 95  Resp: 17 16 18 17   Temp:      TempSrc:      SpO2:  100% 100% 100%   General: Not in acute distress HEENT:       Eyes: PERRL, EOMI, no scleral icterus.       ENT: No discharge from the ears and nose, no pharynx injection, no tonsillar enlargement.        Neck: No JVD, no bruit, no mass felt. Heme: No neck lymph node enlargement. Cardiac:  S1/S2,Irregularly irregular rhythm, 2/6 systolic murmur. No murmurs, No gallops or rubs. Respiratory: No rales, wheezing, rhonchi or rubs. GI: Soft, nondistended, nontender, no rebound pain, no organomegaly, BS present. GU: No hematuria Ext: No pitting leg edema bilaterally. 2+DP/PT pulse bilaterally. Musculoskeletal: No joint deformities, No joint redness or warmth, no limitation of ROM in spin. Skin: No rashes.  Neuro: Alert, oriented X3, cranial nerves II-XII grossly intact, moves all extremities normally. Psych: Patient is not psychotic, no suicidal or hemocidal ideation.  Labs on Admission: I have personally reviewed following labs and imaging studies  CBC:  Recent Labs Lab 08/22/16 1540  WBC 7.1  HGB 7.6*  HCT 24.4*  MCV 88.4  PLT 99991111   Basic Metabolic Panel:  Recent Labs Lab 08/22/16 1540  NA 139  K 4.0  CL 107  CO2 26  GLUCOSE 136*  BUN 14  CREATININE 1.00  CALCIUM 8.9   GFR: Estimated Creatinine Clearance: 53.6 mL/min (by C-G formula based on SCr of 1 mg/dL). Liver Function Tests:  Recent Labs Lab 08/22/16 1540  AST 20  ALT 14*  ALKPHOS 47  BILITOT 0.2*  PROT 7.1  ALBUMIN 3.7   No results for input(s): LIPASE,  AMYLASE in the last 168 hours. No results for input(s): AMMONIA in the last 168 hours. Coagulation Profile: No results for input(s): INR, PROTIME in the last 168 hours. Cardiac Enzymes: No results for input(s): CKTOTAL, CKMB, CKMBINDEX, TROPONINI in the last 168 hours. BNP (last 3 results) No results for input(s): PROBNP in the last 8760 hours. HbA1C: No results for input(s): HGBA1C in the last 72 hours. CBG: No results for input(s): GLUCAP in the last 168 hours. Lipid Profile: No results for input(s): CHOL, HDL, LDLCALC, TRIG, CHOLHDL, LDLDIRECT in the last 72 hours. Thyroid Function Tests: No results for input(s): TSH, T4TOTAL, FREET4, T3FREE, THYROIDAB in the last 72 hours. Anemia Panel: No results for input(s): VITAMINB12, FOLATE, FERRITIN, TIBC, IRON, RETICCTPCT in the last 72 hours. Urine analysis:    Component Value Date/Time   COLORURINE ORANGE BIOCHEMICALS MAY BE AFFECTED BY COLOR (A) 07/13/2010 0824   APPEARANCEUR CLEAR 07/13/2010 0824   LABSPEC 1.018 07/13/2010 0824   PHURINE 6.0 07/13/2010 0824   GLUCOSEU NEGATIVE 07/13/2010 0824   HGBUR NEGATIVE 07/13/2010 0824   BILIRUBINUR NEGATIVE 07/13/2010 0824   KETONESUR 15 (A) 07/13/2010 0824   PROTEINUR 100 (A) 07/13/2010 0824   UROBILINOGEN 1.0 07/13/2010 0824   NITRITE POSITIVE (A) 07/13/2010 0824   LEUKOCYTESUR SMALL (A) 07/13/2010 0824   Sepsis Labs: @LABRCNTIP (procalcitonin:4,lacticidven:4) )No results found for this or any previous visit (from the past 240 hour(s)).   Radiological Exams on Admission: No results found.   EKG: Independently reviewed. Atrial fibrillation, QTC 484, LAD, PVC, poor R-wave progression, widening QRS wave  Assessment/Plan Principal Problem:   Symptomatic anemia Active Problems:   OSA (obstructive sleep apnea)   CAD (coronary artery disease)   Aortic stenosis   Dyslipidemia   Depression   Atrial fibrillation (HCC)   GIB (gastrointestinal bleeding)   Symptomatic anemia due to  GIB: Patient had EGD on 09/16/14 by Dr. Henrene Pastor which showed gastric AVM. He also had capsule endoscopy on 03/15/16, Which Showed A Few Tiny Scattered AVM in Small Bowel. His hemoglobin dropped from 8.7-7.6 with symptoms of fatigue and shortness of breath.  - will admit to tele bed for obs -1 unit of blood was ordered by EDP - NPO - NS at 100 mL/hr - Start IV pantoprazole gtt - Avoid NSAIDs and SQ  heparin - Maintain IV access (2 large bore IVs if possible). - Monitor closely and follow q6h cbc, transfuse as necessary. - hold coumadin  - please call GI in AM.  OSA: -CPAP  Hx of CAD: s/p of CABG. No CP.  -continue metoprolol and Crestor  HLD: Last LDL was 27 on 01/03/12 -Continue home medications: Crestor  Atrial Fibrillation: CHA2DS2-VASc Score is 4, needs oral anticoagulation. Patient is on Coumadin at home. INR is pending on admission. Heart rate is well controlled. -hold coumadin due to GIB -Continue metoprolol  Fullness in the cecum: Inccidental findings. CT scan showed fullness in the cecum, may represent fecal material although the possibility of an underlying mass could not be totally excluded. -please f/u GI recommendation after consultation to GI morning   DVT ppx: SCD Code Status: Full code Family Communication: Yes, patient's  Wife at bed side Disposition Plan:  Anticipate discharge back to previous home environment Consults called:  none Admission status: Obs / tele    Date of Service 08/22/2016    Ivor Costa [facility] Pager [phone]  If 7PM-7AM, please contact night-coverage [email or web address] Password [facility] 08/22/2016, 10:17 PM

## 2016-08-23 DIAGNOSIS — D500 Iron deficiency anemia secondary to blood loss (chronic): Secondary | ICD-10-CM | POA: Diagnosis not present

## 2016-08-23 DIAGNOSIS — D649 Anemia, unspecified: Secondary | ICD-10-CM | POA: Diagnosis not present

## 2016-08-23 DIAGNOSIS — I480 Paroxysmal atrial fibrillation: Secondary | ICD-10-CM

## 2016-08-23 LAB — BASIC METABOLIC PANEL
Anion gap: 4 — ABNORMAL LOW (ref 5–15)
BUN: 11 mg/dL (ref 6–20)
CHLORIDE: 108 mmol/L (ref 101–111)
CO2: 28 mmol/L (ref 22–32)
Calcium: 8.6 mg/dL — ABNORMAL LOW (ref 8.9–10.3)
Creatinine, Ser: 0.87 mg/dL (ref 0.61–1.24)
GFR calc non Af Amer: >60 mL/min (ref >60)
Glucose, Bld: 103 mg/dL — ABNORMAL HIGH (ref 65–99)
POTASSIUM: 3.8 mmol/L (ref 3.5–5.1)
SODIUM: 140 mmol/L (ref 135–145)

## 2016-08-23 LAB — CBC
HEMATOCRIT: 26 % — AB (ref 39.0–52.0)
HEMATOCRIT: 26.7 % — AB (ref 39.0–52.0)
HEMOGLOBIN: 8.2 g/dL — AB (ref 13.0–17.0)
HEMOGLOBIN: 8.4 g/dL — AB (ref 13.0–17.0)
MCH: 27.4 pg (ref 26.0–34.0)
MCH: 27.5 pg (ref 26.0–34.0)
MCHC: 31.5 g/dL (ref 30.0–36.0)
MCHC: 31.5 g/dL (ref 30.0–36.0)
MCV: 87 fL (ref 78.0–100.0)
MCV: 87.3 fL (ref 78.0–100.0)
Platelets: 283 10*3/uL (ref 150–400)
Platelets: 302 10*3/uL (ref 150–400)
RBC: 2.99 MIL/uL — ABNORMAL LOW (ref 4.22–5.81)
RBC: 3.06 MIL/uL — AB (ref 4.22–5.81)
RDW: 15.6 % — ABNORMAL HIGH (ref 11.5–15.5)
RDW: 15.6 % — ABNORMAL HIGH (ref 11.5–15.5)
WBC: 7.6 10*3/uL (ref 4.0–10.5)
WBC: 6.7 10*3/uL (ref 4.0–10.5)

## 2016-08-23 LAB — TYPE AND SCREEN
BLOOD PRODUCT EXPIRATION DATE: 201801092359
ISSUE DATE / TIME: 201712262316
UNIT TYPE AND RH: 600

## 2016-08-23 LAB — PROTIME-INR
INR: 2.12
PROTHROMBIN TIME: 24.1 s — AB (ref 11.4–15.2)

## 2016-08-23 LAB — GLUCOSE, CAPILLARY: GLUCOSE-CAPILLARY: 96 mg/dL (ref 65–99)

## 2016-08-23 LAB — FOLATE: Folate: 31.7 ng/mL (ref >5.9)

## 2016-08-23 NOTE — Progress Notes (Signed)
Patient was discharged home by MD order; discharged instructions review and give to patient and his wife with care notes; IV DIC; patient will be escorted to the car by nurse tech via wheelchair.  

## 2016-08-23 NOTE — Consult Note (Signed)
[facility] Consult: 10:07 AM 08/23/2016  LOS: 0 days    Referring Provider: Dr Clementeen Graham  Primary Care Physician:  Jerlyn Ly, MD Primary Gastroenterologist:  [name]     Reason for Consultation:  Recurrent FOBT + anemia   HPI: Jason [name] is a 82 y.o. male.  Hx CAD s/p CABG.  S/p tissue AVR.  Afib on Coumadin. OSA, on CPAP at night.  Hx prostate cancer.  Longstanding issues with iron def anemia, requiring transfusions and iron infusions.    10/2004 Colonoscopy For adenomatous polyp surveillance.  Colon polyps, unable to locate path report.  Diverticulosis.  Mixed hemorrhoids.  2010 Colonoscopy for history of polyps, constipation  Sigmoid tics.  Sessile rectal polyp ( Tubular Adenoma, no HGD)  Plan for 10/2013 follow up colonoscopy 08/2014 EGD for melena, anemia. Reached to D2.   APC ablation of proximal gastric AVMs.  O/w unremarkable study.  Taking po Iron daily since 08/2014 but anemia persisted.  Hgb ~ 9.5 with elevated MCV in early 2017.  Received iron infusions in 12/2015, did not raise Hgb much.  Capsule endoscopy and hematology referral planned as of last GI visit in 02/2016.    02/2016 Capsule Endoscopy:  [name] gland hyperplasia.  A few, scattered, SB  AVMs.   Heme eval with Dr Irene Limbo 03/2016 and 08/10/16: notes are incomplete.  He stopped the po iron and opted for iron infusions.  Next Heme OV is 1/17.  Hgb 8.3 on 03/29/16.   PRBC x 2 on 04/14/16.   06/29/2016 labs: Hgb 8.9.  Increased retic count.  B12 level above nml. Iron/Ferritin both low.    Iron infusions 11/6, 11/13, 11/20.  Plan as of 12/14 per Dr Irene Limbo was periodic iron infusions but thes were not scheduled during 08/10/16 OV.      Ref. Range 03/29/2016 15:13 06/29/2016 12:05 08/10/2016 12:20 08/22/2016 22:28  Iron Latest Ref Range:  45 - 182 ug/dL 19 (L) 20 (L) 25 (L) 11 (L)  UIBC Latest Units: ug/dL 387 (H) 369 320 410  TIBC Latest Ref Range: 250 - 450 ug/dL 406 389 345 421  %SAT Latest Ref Range: 20 - 55 % 5 (L) 5 (L) 7 (L)   Saturation Ratios Latest Ref Range: 17.9 - 39.5 %    3 (L)  Ferritin Latest Ref Range: 24 - 336 ng/mL 11 (L) 19 (L) 56 14 (L)  Folate Latest Ref Range: >5.9 ng/mL    31.7   08/08/16 CT scan ab/pelvis for follow up of prostate cancer and RLQ pain: Diverticulosis without diverticulitis. Fullness in the cecum which may represent fecal material although the possibility of an underlying mass could not be totally excluded.  Further evaluation when patient is able is recommended.  Pt referred for GI OV on 08/30/15.   Due to outpt labs showing drop in Hgb and some increased fatigue, DOE (per wife was not anything needing immediate admission) but on-call internist felt pt should go to hospital for transfusion and admission. So admitted 12/26 with symptomatic anemia (SOB, fatigue, Hgb 7.6, MCV 87).  INR is 2.1.    As you can see the IDA has worsened. S/p PRBC x 1.  Hgb up to 8.2.  stoll  Is FOBT + and wife relays this is often the case.    Stools vary brown to dark brown.  Have not been black since stopping iron.  No blood and no melena.  No n/v.  No chest pain.   Both wife and pt would prefer discharge home and keep f/up appts with GI and heme.    Past Medical History:  Diagnosis Date  . Abnormal PSA   . Adenomatous polyp of colon 2010 and before 2006  . Anemia   . Aortic stenosis   . Aortic valve prosthesis present   . Atrial fibrillation ([place])   . CAD (coronary artery disease)   . DDD (degenerative disc disease), lumbar   . Depression   . Diverticulosis   . Dyslipidemia   . GERD (gastroesophageal reflux disease)   . Glaucoma   . Hx of CABG   . Hypertension   . Hypogonadism, male   . IFG (impaired fasting glucose)   . Macular degeneration   . OSA (obstructive sleep apnea)   . Osteoarthritis    . Osteopenia   . Prostate CA [facility])     Past Surgical History:  Procedure Laterality Date  . AORTIC VALVE REPLACEMENT  October 2011   Magna Ease pericardial tissue valve #9mm  . BACK SURGERY    . CATARACT EXTRACTION    . CORNEAL TRANSPLANT    . CORONARY ARTERY BYPASS GRAFT  October 2011   LIMA to LAD  . ESOPHAGOGASTRODUODENOSCOPY N/A 09/16/2014   Procedure: ESOPHAGOGASTRODUODENOSCOPY (EGD);  Surgeon: [name], MD;  Location: [facility] ENDOSCOPY;  Service: Endoscopy;  Laterality: N/A;  . TONSILLECTOMY      Prior to Admission medications   Medication Sig Start Date End Date Taking? Authorizing Provider  acetaminophen (TYLENOL) 500 MG tablet Take 500 mg by mouth every 6 (six) hours as needed for moderate pain.    Yes Historical Provider, MD  amoxicillin (AMOXIL) 500 MG capsule FOR DENTAL USED ONLY 01/26/14  Yes Historical Provider, MD  Calcium Carbonate-Vitamin D (CALCIUM-VITAMIN D) 500-200 MG-UNIT per tablet Take 1 tablet by mouth daily.   Yes Historical Provider, MD  Cholecalciferol 1000 UNITS capsule Take 1,000 Units by mouth daily.    Yes Historical Provider, MD  dorzolamide (TRUSOPT) 2 % ophthalmic solution PLACE 1 DROP INTO BOTH EYES 3 TIMES DAILY. 11/03/15  Yes Historical Provider, MD  fluticasone (FLONASE) 50 MCG/ACT nasal spray Place 1 spray into both nostrils as needed for allergies or rhinitis.   Yes Historical Provider, MD  glucosamine-chondroitin 500-400 MG tablet Take 1 tablet by mouth 2 (two) times daily.   Yes Historical Provider, MD  latanoprost (XALATAN) 0.005 % ophthalmic solution Place 1 drop into both eyes daily. 05/16/15  Yes Historical Provider, MD  metoprolol tartrate (LOPRESSOR) 25 MG tablet TAKE 1 TABLET BY MOUTH EVERY DAY *NEED OFFICE VISIT* 06/19/16  Yes [name], MD  Multiple Vitamin (MULTIVITAMIN WITH MINERALS) TABS tablet Take 1 tablet by mouth daily.   Yes Historical Provider, MD  NON FORMULARY Inhale 1 application into the lungs at bedtime. CPAP for apnea    Yes Historical Provider, MD  omega-3 acid ethyl esters (LOVAZA) 1 G capsule Take 1 g by mouth daily.    Yes Historical Provider, MD  perphenazine-amitriptyline (ETRAFON/TRIAVIL) 4-25 MG TABS Take 1 tablet by mouth at bedtime.   Yes Historical Provider,  MD  prednisoLONE acetate (PRED FORTE) 1 % ophthalmic suspension Place 1 drop into the right eye daily.   Yes Historical Provider, MD  ranitidine (ZANTAC) 150 MG tablet Take 150 mg by mouth daily.   Yes Historical Provider, MD  rosuvastatin (CRESTOR) 5 MG tablet Take 5 mg by mouth every other day. Every other day    Yes Historical Provider, MD  senna-docusate (SENOKOT-S) 8.6-50 MG per tablet Take 1 tablet by mouth at bedtime.    Yes Historical Provider, MD  vitamin B-12 (CYANOCOBALAMIN) 1000 MCG tablet Take 1,000 mcg by mouth daily.   Yes Historical Provider, MD  warfarin (COUMADIN) 5 MG tablet Take 2.5-5 mg by mouth daily at 6 PM. Takes 1/2 tab on mon Takes 1 tab all other days   Yes Historical Provider, MD    Scheduled Meds: . calcium-vitamin D  1 tablet Oral Daily  . cholecalciferol  1,000 Units Oral Daily  . dorzolamide  1 drop Both Eyes TID  . latanoprost  1 drop Both Eyes QHS  . metoprolol tartrate  25 mg Oral Daily  . multivitamin with minerals  1 tablet Oral Daily  . omega-3 acid ethyl esters  1 g Oral Daily  . perphenazine-amitriptyline  1 tablet Oral QHS  . prednisoLONE acetate  1 drop Right Eye Daily  . [START ON 08/24/2016] rosuvastatin  5 mg Oral QODAY  . senna-docusate  1 tablet Oral QHS  . sodium chloride flush  3 mL Intravenous Q12H  . vitamin B-12  1,000 mcg Oral Daily   Infusions: . sodium chloride 1,000 mL (08/23/16 0840)  . pantoprozole (PROTONIX) infusion 8 mg/hr (08/23/16 0943)   PRN Meds: acetaminophen, fluticasone, zolpidem   Allergies as of 08/22/2016  . (No Known Allergies)    Family History  Problem Relation Age of Onset  . Heart attack Father     Social History   Social History  . Marital  status: Married    Spouse name: N/A  . Number of children: N/A  . Years of education: N/A   Occupational History  . Not on file.   Social History Main Topics  . Smoking status: Former Smoker    Packs/day: 1.00    Years: 40.00    Types: Cigarettes    Quit date: 11/16/1990  . Smokeless tobacco: Never Used  . Alcohol use 1.2 oz/week    2 Glasses of wine per week  . Drug use: No  . Sexual activity: Not on file   Other Topics Concern  . Not on file   Social History Narrative  . No narrative on file    REVIEW OF SYSTEMS: Constitutional:  Weakness and fatigue but hasn't had any falls or profound dyspnea. Mostly he just feels tired. ENT:  No nose bleeds Pulm:  Stable DOE. No cough. CV:  No sense of palpitations, no LE edema.  No chest pain GU:  No hematuria, no frequency GI:  No dysphagia, no heartburn. No abdominal pain. Heme:  Does not have a tendency to bleed excessively or develop large hematomas.   Transfusions:  Per HPI Neuro:  No headaches, no peripheral tingling or numbness Derm:  No itching, no rash or sores.  Endocrine:  No sweats or chills.  No polyuria or dysuria Immunization:  Not queried Travel:  None beyond local counties in last few months.    PHYSICAL EXAM: Vital signs in last 24 hours: Vitals:   08/23/16 0240 08/23/16 0629  BP: 138/72 (!) 132/58  Pulse: 86 90  Resp: 18 18  Temp: 98.7 F (37.1 C) 98.3 F (36.8 C)   Wt Readings from Last 3 Encounters:  08/23/16 82.3 kg (181 lb 8 oz)  08/14/16 83 kg (183 lb)  08/10/16 83.3 kg (183 lb 9.6 oz)    General: Pleasant. He actually looks really good and looks younger than stated age. Head:  No swelling, no facial asymmetry. No signs of head trauma.  Eyes:  No scleral icterus. No conjunctival pallor. Ears:  Mild hearing deficit.  Nose:  No congestion or discharge. Mouth:  Moist and clear oral mucosa. Tongue is midline. Neck:  No JVD, no masses. No TMG. Lungs:  No cough or labored breathing.  Clear lung  fields bilaterally. Heart: Irregularly irregular. No murmur rub or gallop. S1, S2 present. Abdomen:  Soft. Nontender. No distention. Active bowel sounds. No masses, bruits. No organomegaly..   Rectal: Deferred rectal exam.   Musc/Skeltl: No joint swelling or gross deformities. Extremities:  No CCE.  Neurologic:  Patient is slightly hard of hearing. He is alert and oriented 3. Moves all 4 limbs, strength not tested. No tremor. No gross deficits. Skin:  No telangiectasia. No large hematomas. Tattoos:  None visualized. Nodes:  No cervical adenopathy.   Psych:  Calm, pleasant, relaxed. Speech is fluid.  Intake/Output from previous day: 12/26 0701 - 12/27 0700 In: 995 [I.V.:660; Blood:335] Out: 220 [Urine:220] Intake/Output this shift: Total I/O In: 200 [P.O.:200] Out: 300 [Urine:300]  LAB RESULTS:  Recent Labs  08/22/16 1540 08/22/16 2228 08/23/16 0627  WBC 7.1 7.8 7.6  HGB 7.6* 7.6* 8.2*  HCT 24.4* 24.9* 26.0*  PLT 310 327 283   BMET Lab Results  Component Value Date   NA 140 08/23/2016   NA 139 08/22/2016   NA 141 08/10/2016   K 3.8 08/23/2016   K 4.0 08/22/2016   K 4.3 08/10/2016   CL 108 08/23/2016   CL 107 08/22/2016   CL 106 09/16/2014   CO2 28 08/23/2016   CO2 26 08/22/2016   CO2 25 08/10/2016   GLUCOSE 103 (H) 08/23/2016   GLUCOSE 136 (H) 08/22/2016   GLUCOSE 97 08/10/2016   BUN 11 08/23/2016   BUN 14 08/22/2016   BUN 15.5 08/10/2016   CREATININE 0.87 08/23/2016   CREATININE 1.00 08/22/2016   CREATININE 1.1 08/10/2016   CALCIUM 8.6 (L) 08/23/2016   CALCIUM 8.9 08/22/2016   CALCIUM 8.9 08/10/2016   LFT  Recent Labs  08/22/16 1540  PROT 7.1  ALBUMIN 3.7  AST 20  ALT 14*  ALKPHOS 47  BILITOT 0.2*   PT/INR Lab Results  Component Value Date   INR 2.12 08/23/2016   INR 2.29 08/22/2016   INR 1.35 09/14/2014   Hepatitis Panel No results for input(s): HEPBSAG, HCVAB, HEPAIGM, HEPBIGM in the last 72 hours. C-Diff No components found for:  CDIFF Lipase  No results found for: LIPASE  Drugs of Abuse  No results found for: LABOPIA, COCAINSCRNUR, LABBENZ, AMPHETMU, THCU, LABBARB   RADIOLOGY STUDIES: No results found.   IMPRESSION:   *  Acute on chronic IDA, transfusion requiring.  Iron infusions x 3 in 06/2016.  Once again is FOBT +.  S/p PRBC x1.   Known SB and gastric AVMs.  Gastric AVM ablation in 08/2015.    *  Cecal mass vs fecal material.  Hx adenomatous polyps, last scope 2010.    *  Chronic Coumadin for hx A fib.  S/p tissue AVR.  INR is therapeutic.    PLAN:     *  ?  Allow pt to discharge and keep 08/29/16 GI OV and 09/13/16 Heme visit?  Dr Loletha Carrow to decide.   ? Need at some point for enteroscopy and ablation of any visulized AVMs.  ? Need for colonoscopy to eval the cecum, vs repeat CT scan?    Azucena Freed  08/23/2016, 10:07 AM Pager: [phone]  I have reviewed the entire case in detail with the above APP and discussed the plan in detail.  Therefore, I agree with the diagnoses recorded above. In addition,  I have personally interviewed and examined the patient and have personally reviewed any abdominal/pelvic CT scan images.  My additional thoughts are as follows:   I personally reviewed the CT scan images.  The cecum is prominent, but does appear most like retained fecal material.  He is nontender in the RLQ.  His IDA and heme pos stool are chronic and appear to be due to SB blood loss. Hgb was only a gram below his baseline, and now back to baseline after 1 unit PRBCs.  I do not think he needs inpatient endoscopic procedures.  I think he can follow up in office as schedule next week and Dr Henrene Pastor can decide if colonoscopy warranted.  Nelida Meuse III Pager [phone]  Mon-Fri 8a-5p [phone] after 5p, weekends, holidays

## 2016-08-23 NOTE — Progress Notes (Signed)
Pt. placed on CPAP for h/s, tolerating well. 

## 2016-08-23 NOTE — Progress Notes (Signed)
Lab contacted at 0620 about blood work. Will send lab tech to draw blood ASAP.

## 2016-08-23 NOTE — Progress Notes (Signed)
Pt arrived to [place], wife at the bedside, ambulated with assistance from stretcher to bed. VS collected, denied pain, no signs and symptoms of acute distress. Pt advised about valuable policy, oriented to room and instructed to use call light for assistance.  Cardiac monitor in place, call light within reach.

## 2016-08-23 NOTE — Care Management Obs Status (Signed)
[facility] NOTIFICATION   Patient Details  Name: Jason [name] MRN: [id number] Date of Birth: [birth year]   Medicare Observation Status Notification Given:  Yes    Carles Collet, RN 08/23/2016, 12:02 PM

## 2016-08-23 NOTE — Discharge Summary (Signed)
Physician Discharge Summary  Jason [name] [id number] DOB: Jan 12, 1926 DOA: 08/22/2016  PCP: Jerlyn Ly, MD  Admit date: 08/22/2016 Discharge date: 08/23/2016  Admitted From: Home Disposition: Home  Recommendations for Outpatient Follow-up:  1. Follow up with PCP in 1-2 weeks. Monitor H&H During outpatient follow-up.  2. Has follow-up with gastroenterologist in 2 weeks. (08/29/2016)  Home Health: None Equipment/Devices: None  Discharge Condition: Fair CODE STATUS: Full code Diet recommendation: Heart Healthy    Discharge Diagnoses:  Principal Problem:   Symptomatic anemia  Active Problems:   OSA (obstructive sleep apnea)   CAD (coronary artery disease)   Aortic stenosis   Dyslipidemia   Depression   Atrial fibrillation (HCC)   GIB (gastrointestinal bleeding)  Brief narrative/history of present illness 82 year old male with history of GI bleed secondary to AVM of stomach and small bowel (capsule endoscopy done in July this year), hypertension, hyperlipidemia, A. fib on Coumadin, aortic stenosis with aortic valve replacement, OSA on CPAP, CAD with history of CABG, prostate cancer, GERD, hyperlipidemia who presented with fatigue with dyspnea on exertion. He saw his PCP who checked his hemoglobin and was found to have about grams drop from his baseline (8.7--> 7.6). He was sent to the ED. Patient denied any fever, chills, nausea, vomiting, hematemesis or melena. Denied any abdominal pain, chest pain.  In the ED hemoglobin was 7.6 with positive FOBT. Vitals and admitting labs were stable. CT of the abdomen and pelvis showed a nonobstructive left renal stone, diverticulosis and fullness of the cecum possibly with  fecal material.  Patient placed on observation and given 1 unit PRBC.   Hospital course Symptomatic anemia Associated weakness and dyspnea on exertion.. Received 1 unit PRBC with appropriate improvement in hemoglobin. Denies any symptoms this morning. Seen  by GI who recommended no further inpatient workup given her mild drop in H&H and minimal symptoms that has resolved. Has outpatient follow-up with GI in 2 weeks and will decide on further need for colonoscopy.. Patient stable on telemetry.  Atrial fibrillation Rate controlled. Continue metoprolol and Coumadin.  OSA Continue CPAP.  Coronary artery disease History of CABG. Continue beta blocker and Crestor.  Cecal fullness Incidental finding on CT scan which is likely due to retained fecal material.   Patient stable to be discharged home with outpatient follow-up.  Consults: [facility]  Family communication: Wife at bedside   Disposition: Home   Discharge Instructions   Allergies as of 08/23/2016   No Known Allergies     Medication List    TAKE these medications   acetaminophen 500 MG tablet Commonly known as:  TYLENOL Take 500 mg by mouth every 6 (six) hours as needed for moderate pain.   amoxicillin 500 MG capsule Commonly known as:  AMOXIL FOR DENTAL USED ONLY   calcium-vitamin D 500-200 MG-UNIT tablet Take 1 tablet by mouth daily.   Cholecalciferol 1000 units capsule Take 1,000 Units by mouth daily.   dorzolamide 2 % ophthalmic solution Commonly known as:  TRUSOPT PLACE 1 DROP INTO BOTH EYES 3 TIMES DAILY.   fluticasone 50 MCG/ACT nasal spray Commonly known as:  FLONASE Place 1 spray into both nostrils as needed for allergies or rhinitis.   glucosamine-chondroitin 500-400 MG tablet Take 1 tablet by mouth 2 (two) times daily.   latanoprost 0.005 % ophthalmic solution Commonly known as:  XALATAN Place 1 drop into both eyes daily.   metoprolol tartrate 25 MG tablet Commonly known as:  LOPRESSOR TAKE 1 TABLET BY MOUTH EVERY DAY *  NEED OFFICE VISIT*   multivitamin with minerals Tabs tablet Take 1 tablet by mouth daily.   NON FORMULARY Inhale 1 application into the lungs at bedtime. CPAP for apnea   omega-3 acid ethyl esters 1 g capsule Commonly  known as:  LOVAZA Take 1 g by mouth daily.   perphenazine-amitriptyline 4-25 MG Tabs tablet Commonly known as:  ETRAFON/TRIAVIL Take 1 tablet by mouth at bedtime.   prednisoLONE acetate 1 % ophthalmic suspension Commonly known as:  PRED FORTE Place 1 drop into the right eye daily.   ranitidine 150 MG tablet Commonly known as:  ZANTAC Take 150 mg by mouth daily.   rosuvastatin 5 MG tablet Commonly known as:  CRESTOR Take 5 mg by mouth every other day. Every other day   senna-docusate 8.6-50 MG tablet Commonly known as:  Senokot-S Take 1 tablet by mouth at bedtime.   vitamin B-12 1000 MCG tablet Commonly known as:  CYANOCOBALAMIN Take 1,000 mcg by mouth daily.   warfarin 5 MG tablet Commonly known as:  COUMADIN Take 2.5-5 mg by mouth daily at 6 PM. Takes 1/2 tab on mon Takes 1 tab all other days      Follow-up Information    PERINI,MARK A, MD. Schedule an appointment as soon as possible for a visit in 1 week(s).   Specialty:  Internal Medicine Contact information: [place] [address] [phone]        [name], MD Follow up on 08/29/2016.   Specialty:  Gastroenterology Contact information: [address] [place] [address] [phone]          No Known Allergies      Procedures/Studies: Ct Abdomen Pelvis W Contrast  Result Date: 08/08/2016 CLINICAL DATA:  Right lower quadrant pain for 2 months, history of prostate carcinoma EXAM: CT ABDOMEN AND PELVIS WITH CONTRAST TECHNIQUE: Multidetector CT imaging of the abdomen and pelvis was performed using the standard protocol following bolus administration of intravenous contrast. CONTRAST:  149mL ISOVUE-300 IOPAMIDOL (ISOVUE-300) INJECTION 61% COMPARISON:  None. FINDINGS: Lower chest: No acute abnormality. Coronary calcifications are noted. Hepatobiliary: No focal liver abnormality is seen. No gallstones, gallbladder wall thickening, or biliary dilatation. Pancreas: Unremarkable. No  pancreatic ductal dilatation or surrounding inflammatory changes. Spleen: Normal in size without focal abnormality. Adrenals/Urinary Tract: The adrenal glands and right kidney are within normal limits. A nonobstructing 5 mm stone is noted in the upper pole of the left kidney. No ureteral calculi are seen. The bladder is partially distended. Stomach/Bowel: Scattered diverticular change of the colon is noted. No diverticulitis is noted. The appendix appears have been surgically removed. Fullness in the cecum is seen which is likely related to fecal material although the possibility of a focal mass could not be totally excluded on this exam. Vascular/Lymphatic: Aortic atherosclerosis. No enlarged abdominal or pelvic lymph nodes. Reproductive: Prostate is unremarkable. Other: No abdominal wall hernia or abnormality. No abdominopelvic ascites. Musculoskeletal: Degenerative change of the lumbar spine is noted. IMPRESSION: Nonobstructing left renal stone. Diverticulosis without diverticulitis. Fullness in the cecum which may represent fecal material although the possibility of an underlying mass could not be totally excluded. Further evaluation when patient is able is recommended. Electronically Signed   By: Inez Catalina M.D.   On: 08/08/2016 15:48       Subjective: Denies any shortness of breath or weakness this morning.  Discharge Exam: Vitals:   08/23/16 0629 08/23/16 1421  BP: (!) 132/58 127/61  Pulse: 90 (!) 111  Resp: 18  20  Temp: 98.3 F (36.8 C) 97.9 F (36.6 C)   Vitals:   08/23/16 0240 08/23/16 0352 08/23/16 0629 08/23/16 1421  BP: 138/72  (!) 132/58 127/61  Pulse: 86  90 (!) 111  Resp: 18  18 20   Temp: 98.7 F (37.1 C)  98.3 F (36.8 C) 97.9 F (36.6 C)  TempSrc: Oral  Oral Oral  SpO2: 97%  97% 98%  Weight:  82.3 kg (181 lb 8 oz)    Height:  5\' 11"  (1.803 m)      General:Elderly male not in distress HEENT: Pallor present, moist mucosa, supple neck Chest: Clear bilaterally, no  added sounds CVS: S1 and S2 irregular, no murmurs GI: Soft, nondistended, nontender, bowel sounds present Musculoskeletal: Warm, no edema    The results of significant diagnostics from this hospitalization (including imaging, microbiology, ancillary and laboratory) are listed below for reference.     Microbiology: No results found for this or any previous visit (from the past 240 hour(s)).   Labs: BNP (last 3 results) No results for input(s): BNP in the last 8760 hours. Basic Metabolic Panel:  Recent Labs Lab 08/22/16 1540 08/23/16 0627  NA 139 140  K 4.0 3.8  CL 107 108  CO2 26 28  GLUCOSE 136* 103*  BUN 14 11  CREATININE 1.00 0.87  CALCIUM 8.9 8.6*   Liver Function Tests:  Recent Labs Lab 08/22/16 1540  AST 20  ALT 14*  ALKPHOS 47  BILITOT 0.2*  PROT 7.1  ALBUMIN 3.7   No results for input(s): LIPASE, AMYLASE in the last 168 hours. No results for input(s): AMMONIA in the last 168 hours. CBC:  Recent Labs Lab 08/22/16 1540 08/22/16 2228 08/23/16 0627 08/23/16 1005  WBC 7.1 7.8 7.6 6.7  HGB 7.6* 7.6* 8.2* 8.4*  HCT 24.4* 24.9* 26.0* 26.7*  MCV 88.4 87.4 87.0 87.3  PLT 310 327 283 302   Cardiac Enzymes: No results for input(s): CKTOTAL, CKMB, CKMBINDEX, TROPONINI in the last 168 hours. BNP: Invalid input(s): POCBNP CBG:  Recent Labs Lab 08/23/16 0810  GLUCAP 96   D-Dimer No results for input(s): DDIMER in the last 72 hours. Hgb A1c No results for input(s): HGBA1C in the last 72 hours. Lipid Profile No results for input(s): CHOL, HDL, LDLCALC, TRIG, CHOLHDL, LDLDIRECT in the last 72 hours. Thyroid function studies No results for input(s): TSH, T4TOTAL, T3FREE, THYROIDAB in the last 72 hours.  Invalid input(s): FREET3 Anemia work up  Recent Labs  08/22/16 2228  VITAMINB12 1,072*  FOLATE 31.7  FERRITIN 14*  TIBC 421  IRON 11*  RETICCTPCT 2.4   Urinalysis    Component Value Date/Time   COLORURINE ORANGE BIOCHEMICALS MAY BE  AFFECTED BY COLOR (A) 07/13/2010 0824   APPEARANCEUR CLEAR 07/13/2010 0824   LABSPEC 1.018 07/13/2010 0824   PHURINE 6.0 07/13/2010 0824   GLUCOSEU NEGATIVE 07/13/2010 0824   HGBUR NEGATIVE 07/13/2010 0824   BILIRUBINUR NEGATIVE 07/13/2010 0824   KETONESUR 15 (A) 07/13/2010 0824   PROTEINUR 100 (A) 07/13/2010 0824   UROBILINOGEN 1.0 07/13/2010 0824   NITRITE POSITIVE (A) 07/13/2010 0824   LEUKOCYTESUR SMALL (A) 07/13/2010 0824   Sepsis Labs Invalid input(s): PROCALCITONIN,  WBC,  LACTICIDVEN Microbiology No results found for this or any previous visit (from the past 240 hour(s)).   Time coordinating discharge: <30 minutes  SIGNED:   Louellen Molder, MD  [facility] 08/23/2016, 2:38 PM Pager   If 7PM-7AM, please contact night-coverage [email or web address] Password [email or web address]

## 2016-08-23 NOTE — Care Management Note (Signed)
Case Management Note  Patient Details  Name: Jason [name] MRN: [id number] Date of Birth: 06-30-1926  Subjective/Objective:                 Spoke with patient and wife at bedside. Patient from [place]. Discussed HH needs, patient and wife decline HH inculding PT if rec, eval pending. Patient has cane and walker available at home for use. In obs for symptomatic anemia.    Action/Plan:  DC to home with wife when medically clear.   Expected Discharge Date:                  Expected Discharge Plan:  Home/Self Care  In-House Referral:     Discharge planning Services  CM Consult  Post Acute Care Choice:    Choice offered to:  Patient, Spouse  DME Arranged:    DME Agency:     HH Arranged:  Patient Refused [place] Agency:     Status of Service:  Completed, signed off  If discussed at [place] of Stay Meetings, dates discussed:    Additional Comments:  Carles Collet, RN 08/23/2016, 12:02 PM

## 2016-08-23 NOTE — Progress Notes (Signed)
Lab contacted to draw labs at 0500.

## 2016-08-25 NOTE — Progress Notes (Signed)
[name]    HEMATOLOGY/ONCOLOGY CLINIC NOTE  Date of Service: .06/29/2016  Patient Care Team: [name], MD as PCP - General (Internal Medicine)  CHIEF COMPLAINTS/PURPOSE OF CONSULTATION:  Iron deficiency Anemia  HISTORY OF PRESENTING ILLNESS:   Jason [name] is a wonderful 82 y.o. male who has been referred to [place] by Dr .Jerlyn Ly, MD for evaluation and management of Iron deficiency anemia.  He has multiple medical co-morbids as noted below including HTN, afib on coumadin, Aortic stenosis (AVR), GERD, prostate cancer (Mx by Dr Risa Grill)  who has had issues with IDA requiring PRBC transfusions in 09/2014 and 12/2015. His labs with his PCP has show anemia with hgb in the 8-10 range with low iron sat, nl B12 and SPEP neg for M spike He notes no overt GI bleeding. No overt hematuria.  GI workup with Dr Henrene Pastor --capsule endoscopy 02/2016 showed few tiny scattered AVM's in the small bowel otherwise normal study. EGD 08/2014-proximal gastric AVM. Colonoscopy last 10/2008 - moderate diverticulosis. Sessile polyp removed from rectum. He has elevated PSA level that is concern for recurrent prostate cancer -- being monitored by Dr Risa Grill. Has been on PO iron replacement.  INTERVAL HISTORY  Patient is here for followup. He received a PRBC transfusion in GI on 04/19/2016. Hgb today is 8.9, ferritin only increased to 19. He has decided to proceed with IV Iron replacement.   MEDICAL HISTORY:  Past Medical History:  Diagnosis Date  . Abnormal PSA   . Adenomatous polyp of colon 2010 and before 2006  . Anemia   . Aortic stenosis   . Aortic valve prosthesis present   . Atrial fibrillation ([place])   . CAD (coronary artery disease)   . DDD (degenerative disc disease), lumbar   . Depression   . Diverticulosis   . Dyslipidemia   . GERD (gastroesophageal reflux disease)   . Glaucoma   . Hx of CABG   . Hypertension   . Hypogonadism, male   . IFG (impaired fasting glucose)   . Macular degeneration     . OSA (obstructive sleep apnea)   . Osteoarthritis   . Osteopenia   . Prostate CA [facility])     SURGICAL HISTORY: Past Surgical History:  Procedure Laterality Date  . AORTIC VALVE REPLACEMENT  October 2011   Magna Ease pericardial tissue valve #37m  . BACK SURGERY    . CATARACT EXTRACTION    . CORNEAL TRANSPLANT    . CORONARY ARTERY BYPASS GRAFT  October 2011   LIMA to LAD  . ESOPHAGOGASTRODUODENOSCOPY N/A 09/16/2014   Procedure: ESOPHAGOGASTRODUODENOSCOPY (EGD);  Surgeon: JIrene Shipper MD;  [facility]  Service: Endoscopy;  Laterality: N/A;  . TONSILLECTOMY      SOCIAL HISTORY: Social History   Social History  . Marital status: Married    Spouse name: N/A  . Number of children: N/A  . Years of education: N/A   Occupational History  . Not on file.   Social History Main Topics  . Smoking status: Former Smoker    Packs/day: 1.00    Years: 40.00    Types: Cigarettes    Quit date: 11/16/1990  . Smokeless tobacco: Never Used  . Alcohol use 1.2 oz/week    2 Glasses of wine per week  . Drug use: No  . Sexual activity: Not on file   Other Topics Concern  . Not on file   Social History Narrative  . No narrative on file    FAMILY HISTORY:  Family History  Problem Relation Age of Onset  . Heart attack Father     ALLERGIES:  has No Known Allergies.  MEDICATIONS:  Current Outpatient Prescriptions  Medication Sig Dispense Refill  . acetaminophen (TYLENOL) 500 MG tablet Take 500 mg by mouth every 6 (six) hours as needed for moderate pain.     [name] amoxicillin (AMOXIL) 500 MG capsule FOR DENTAL USED ONLY    . Calcium Carbonate-Vitamin D (CALCIUM-VITAMIN D) 500-200 MG-UNIT per tablet Take 1 tablet by mouth daily.    . Cholecalciferol 1000 UNITS capsule Take 1,000 Units by mouth daily.     . dorzolamide (TRUSOPT) 2 % ophthalmic solution PLACE 1 DROP INTO BOTH EYES 3 TIMES DAILY.  11  . fluticasone (FLONASE) 50 MCG/ACT nasal spray Place 1 spray into both nostrils  as needed for allergies or rhinitis.    [name] glucosamine-chondroitin 500-400 MG tablet Take 1 tablet by mouth 2 (two) times daily.    [name] latanoprost (XALATAN) 0.005 % ophthalmic solution Place 1 drop into both eyes daily.  11  . metoprolol tartrate (LOPRESSOR) 25 MG tablet TAKE 1 TABLET BY MOUTH EVERY DAY *NEED OFFICE VISIT* 90 tablet 0  . Multiple Vitamin (MULTIVITAMIN WITH MINERALS) TABS tablet Take 1 tablet by mouth daily.    . NON FORMULARY Inhale 1 application into the lungs at bedtime. CPAP for apnea    . omega-3 acid ethyl esters (LOVAZA) 1 G capsule Take 1 g by mouth daily.     [name] perphenazine-amitriptyline (ETRAFON/TRIAVIL) 4-25 MG TABS Take 1 tablet by mouth at bedtime.    . prednisoLONE acetate (PRED FORTE) 1 % ophthalmic suspension Place 1 drop into the right eye daily.    . ranitidine (ZANTAC) 150 MG tablet Take 150 mg by mouth daily.    . rosuvastatin (CRESTOR) 5 MG tablet Take 5 mg by mouth every other day. Every other day     . senna-docusate (SENOKOT-S) 8.6-50 MG per tablet Take 1 tablet by mouth at bedtime.     . vitamin B-12 (CYANOCOBALAMIN) 1000 MCG tablet Take 1,000 mcg by mouth daily.    [name] warfarin (COUMADIN) 5 MG tablet Take 2.5-5 mg by mouth daily at 6 PM. Takes 1/2 tab on mon Takes 1 tab all other days     No current facility-administered medications for this visit.     REVIEW OF SYSTEMS:    10 Point review of Systems was done is negative except as noted above.  PHYSICAL EXAMINATION: ECOG PERFORMANCE STATUS: 2 - Symptomatic, <50% confined to bed  . Vitals:   06/29/16 1055  BP: (!) 129/50  Pulse: 94  Resp: 18  Temp: 97.9 F (36.6 C)   Filed Weights   06/29/16 1055  Weight: 181 lb 3.2 oz (82.2 kg)   .Body mass index is 25.27 kg/m.  GENERAL:alert, in no acute distress and comfortable SKIN: skin color, texture, turgor are normal, no rashes or significant lesions EYES: normal, conjunctiva are pink and non-injected, sclera clear OROPHARYNX:no exudate, no  erythema and lips, buccal mucosa, and tongue normal  NECK: supple, no JVD, thyroid normal size, non-tender, without nodularity LYMPH:  no palpable lymphadenopathy in the cervical, axillary or inguinal LUNGS: decreased AE bilateral, few scattered rhonci HEART: irregular s1s2 ABDOMEN: abdomen soft, non-tender, normoactive bowel sounds  Musculoskeletal: no cyanosis of digits and no clubbing  PSYCH: alert & oriented x 3 with fluent speech NEURO: no focal motor/sensory deficits  LABORATORY DATA:  I have reviewed the data as listed  Component  Latest Ref Rng & Units 06/29/2016  WBC     4.0 - 10.3 10e3/uL 6.8  NEUT#     1.5 - 6.5 10e3/uL 4.5  Hemoglobin     13.0 - 17.1 g/dL 8.9 (L)  HCT     38.4 - 49.9 % 29.3 (L)  Platelets     140 - 400 10e3/uL 265  MCV     79.3 - 98.0 fL 97.3  MCH     27.2 - 33.4 pg 29.6  MCHC     32.0 - 36.0 g/dL 30.4 (L)  RBC     4.20 - 5.82 10e6/uL 3.01 (L)  RDW     11.0 - 14.6 % 13.9  lymph#     0.9 - 3.3 10e3/uL 1.4  MONO#     0.1 - 0.9 10e3/uL 0.7  Eosinophils Absolute     0.0 - 0.5 10e3/uL 0.2  Basophils Absolute     0.0 - 0.1 10e3/uL 0.1  NEUT%     39.0 - 75.0 % 65.3  LYMPH%     14.0 - 49.0 % 20.5  MONO%     0.0 - 14.0 % 9.5  EOS%     0.0 - 7.0 % 3.1  BASO%     0.0 - 2.0 % 1.6  Retic %     0.80 - 1.80 % 3.90 (H)  Retic Ct Abs     34.80 - 93.90 10e3/uL 117.39 (H)  Immature Retic Fract     3.00 - 10.60 % 16.20 (H)  Sodium     136 - 145 mEq/L 140  Potassium     3.5 - 5.1 mEq/L 4.3  Chloride     98 - 109 mEq/L 108  CO2     22 - 29 mEq/L 26  Glucose     70 - 140 mg/dl 98  BUN     7.0 - 26.0 mg/dL 16.3  Creatinine     0.7 - 1.3 mg/dL 1.0  Total Bilirubin     0.20 - 1.20 mg/dL 0.25  Alkaline Phosphatase     40 - 150 U/L 58  AST     5 - 34 U/L 16  ALT     0 - 55 U/L 13  Total Protein     6.4 - 8.3 g/dL 7.4  Albumin     3.5 - 5.0 g/dL 3.7  Calcium     8.4 - 10.4 mg/dL 8.8  Anion gap     3 - 11 mEq/L 6  EGFR     >90  ml/min/1.73 m2 64 (L)  Iron     42 - 163 ug/dL 20 (L)  TIBC     202 - 409 ug/dL 389  UIBC     117 - 376 ug/dL 369  %SAT     20 - 55 % 5 (L)  Ferritin     22 - 316 ng/ml 19 (L)  Vitamin B12     211 - 946 pg/mL 1,063 (H)   RADIOGRAPHIC STUDIES: I have personally reviewed the radiological images as listed and agreed with the findings in the report. Ct Abdomen Pelvis W Contrast  Result Date: 08/08/2016 CLINICAL DATA:  Right lower quadrant pain for 2 months, history of prostate carcinoma EXAM: CT ABDOMEN AND PELVIS WITH CONTRAST TECHNIQUE: Multidetector CT imaging of the abdomen and pelvis was performed using the standard protocol following bolus administration of intravenous contrast. CONTRAST:  122m ISOVUE-300 IOPAMIDOL (ISOVUE-300) INJECTION 61% COMPARISON:  None. FINDINGS: Lower chest:  No acute abnormality. Coronary calcifications are noted. Hepatobiliary: No focal liver abnormality is seen. No gallstones, gallbladder wall thickening, or biliary dilatation. Pancreas: Unremarkable. No pancreatic ductal dilatation or surrounding inflammatory changes. Spleen: Normal in size without focal abnormality. Adrenals/Urinary Tract: The adrenal glands and right kidney are within normal limits. A nonobstructing 5 mm stone is noted in the upper pole of the left kidney. No ureteral calculi are seen. The bladder is partially distended. Stomach/Bowel: Scattered diverticular change of the colon is noted. No diverticulitis is noted. The appendix appears have been surgically removed. Fullness in the cecum is seen which is likely related to fecal material although the possibility of a focal mass could not be totally excluded on this exam. Vascular/Lymphatic: Aortic atherosclerosis. No enlarged abdominal or pelvic lymph nodes. Reproductive: Prostate is unremarkable. Other: No abdominal wall hernia or abnormality. No abdominopelvic ascites. Musculoskeletal: Degenerative change of the lumbar spine is noted. IMPRESSION:  Nonobstructing left renal stone. Diverticulosis without diverticulitis. Fullness in the cecum which may represent fecal material although the possibility of an underlying mass could not be totally excluded. Further evaluation when patient is able is recommended. Electronically Signed   By: Inez Catalina M.D.   On: 08/08/2016 15:48    ASSESSMENT & PLAN:   82 yo with multiple medical co-morbids with   1) Normocytic Anemia - likely multifactorial. Appears to be primarily from Iron deficiency Anemia - likely from GI losses related to ongoing anticoagulation for afib and previous known small bowel and gastric AVM's and diverticulosis. No evidence of hemolysis SPEP neg Patient notes he has an increasing PSA level -followed with Dr Risa Grill  PLAN -transfuse prn for hgb<8 or if symptomatic. Not overtly symptomatic at this time. -he has been setup for IV Feraheme qweekly x 3 doses -risk vs benefits of continued anticoagulation in the setting of chronic GI bleeding -- to be determined by PCP and cardiology. -may hold off on PO iron at this time. -he will RTC with Dr Irene Limbo in 5-6 weeks with labs to determine response to IV iron  2)Increasing PSA - concerning for progressive prostate cancer -continue f/u with Urology - Dr Risa Grill.  3) Patient Active Problem List   Diagnosis Date Noted  . Symptomatic anemia 08/22/2016  . Iron deficiency anemia due to chronic blood loss 06/29/2016  . Melena   . Angiodysplasia of stomach and duodenum without bleeding   . GIB (gastrointestinal bleeding) 09/14/2014  . Atrial fibrillation ([place]) 07/15/2014  . PVC's (premature ventricular contractions) 12/05/2012  . Fatigue 12/12/2011  . Heart palpitations 01/24/2011  . CAD (coronary artery disease)   . Aortic stenosis   . Dyslipidemia   . Depression   . ANXIETY 11/06/2008  . Aortic valve disorder 11/06/2008  . CONSTIPATION 11/06/2008  . IDIOPATHIC OSTEOPOROSIS 11/06/2008  . OSA (obstructive sleep apnea) 11/06/2008    . HEMORRHOIDS 11/05/2008  . DIVERTICULOSIS, COLON 11/05/2008  . Personal history of colonic polyps 11/05/2008   -continue other medical management with PCP  All of the patients questions were answered with apparent satisfaction. The patient knows to call the clinic with any problems, questions or concerns.  I spent 20 minutes counseling the patient face to face. The total time spent in the appointment was 20 minutes and more than 50% was on counseling and direct patient cares.    Sullivan Lone MD [place] AAHIVMS [facility] Hematology/Oncology Physician [facility]  (Office):       [phone] (Work cell):  [phone] (Fax):  [phone]

## 2016-08-25 NOTE — Progress Notes (Signed)
Jason [name]    HEMATOLOGY/ONCOLOGY CLINIC NOTE  Date of Service: .08/10/2016  Patient Care Team: [name], MD as PCP - General (Internal Medicine)  CHIEF COMPLAINTS/PURPOSE OF CONSULTATION:  Iron deficiency Anemia  HISTORY OF PRESENTING ILLNESS:   Jason [name] is a wonderful 82 y.o. male who has been referred to [place] by Dr .Jerlyn Ly, MD for evaluation and management of Iron deficiency anemia.  He has multiple medical co-morbids as noted below including HTN, afib on coumadin, Aortic stenosis (AVR), GERD, prostate cancer (Mx by Dr Risa Grill)  who has had issues with IDA requiring PRBC transfusions in 09/2014 and 12/2015. His labs with his PCP has show anemia with hgb in the 8-10 range with low iron sat, nl B12 and SPEP neg for M spike He notes no overt GI bleeding. No overt hematuria.  GI workup with Dr Henrene Pastor --capsule endoscopy 02/2016 showed few tiny scattered AVM's in the small bowel otherwise normal study. EGD 08/2014-proximal gastric AVM. Colonoscopy last 10/2008 - moderate diverticulosis. Sessile polyp removed from rectum. He has elevated PSA level that is concern for recurrent prostate cancer -- being monitored by Dr Risa Grill. Has been on PO iron replacement.  INTERVAL HISTORY  Patient is here for followup after having received IV feraheme on 11/6, 11/13 and 11/20 and tolerated this well. His hgb had improved to 9.9 on 07/24/2016 based on outside labs and he noted some increase in energy levels. How his hgb drop back down to 8.7 with limited bump in ferritin to 56 suggesting ongoing blood loss - likely GI. Notes no overt GI bleeding that he can appreciate. Continues to be on coumadin. Patient had CT abd/pelvis with PCP which showed concern for possible cecal mass vs fecal matter.  MEDICAL HISTORY:  Past Medical History:  Diagnosis Date  . Abnormal PSA   . Adenomatous polyp of colon 2010 and before 2006  . Anemia   . Aortic stenosis   . Aortic valve prosthesis present   . Atrial  fibrillation ([place])   . CAD (coronary artery disease)   . DDD (degenerative disc disease), lumbar   . Depression   . Diverticulosis   . Dyslipidemia   . GERD (gastroesophageal reflux disease)   . Glaucoma   . Hx of CABG   . Hypertension   . Hypogonadism, male   . IFG (impaired fasting glucose)   . Macular degeneration   . OSA (obstructive sleep apnea)   . Osteoarthritis   . Osteopenia   . Prostate CA [facility])     SURGICAL HISTORY: Past Surgical History:  Procedure Laterality Date  . AORTIC VALVE REPLACEMENT  October 2011   Magna Ease pericardial tissue valve #68m  . BACK SURGERY    . CATARACT EXTRACTION    . CORNEAL TRANSPLANT    . CORONARY ARTERY BYPASS GRAFT  October 2011   LIMA to LAD  . ESOPHAGOGASTRODUODENOSCOPY N/A 09/16/2014   Procedure: ESOPHAGOGASTRODUODENOSCOPY (EGD);  Surgeon: JIrene Shipper MD;  [facility];  Service: Endoscopy;  Laterality: N/A;  . TONSILLECTOMY      SOCIAL HISTORY: Social History   Social History  . Marital status: Married    Spouse name: N/A  . Number of children: N/A  . Years of education: N/A   Occupational History  . Not on file.   Social History Main Topics  . Smoking status: Former Smoker    Packs/day: 1.00    Years: 40.00    Types: Cigarettes    Quit date: 11/16/1990  .  Smokeless tobacco: Never Used  . Alcohol use 1.2 oz/week    2 Glasses of wine per week  . Drug use: No  . Sexual activity: Not on file   Other Topics Concern  . Not on file   Social History Narrative  . No narrative on file    FAMILY HISTORY: Family History  Problem Relation Age of Onset  . Heart attack Father     ALLERGIES:  has No Known Allergies.  MEDICATIONS:  Current Outpatient Prescriptions  Medication Sig Dispense Refill  . acetaminophen (TYLENOL) 500 MG tablet Take 500 mg by mouth every 6 (six) hours as needed for moderate pain.     Jason [name] amoxicillin (AMOXIL) 500 MG capsule FOR DENTAL USED ONLY    . Calcium Carbonate-Vitamin D  (CALCIUM-VITAMIN D) 500-200 MG-UNIT per tablet Take 1 tablet by mouth daily.    . Cholecalciferol 1000 UNITS capsule Take 1,000 Units by mouth daily.     . dorzolamide (TRUSOPT) 2 % ophthalmic solution PLACE 1 DROP INTO BOTH EYES 3 TIMES DAILY.  11  . fluticasone (FLONASE) 50 MCG/ACT nasal spray Place 1 spray into both nostrils as needed for allergies or rhinitis.    Jason [name] glucosamine-chondroitin 500-400 MG tablet Take 1 tablet by mouth 2 (two) times daily.    Jason [name] latanoprost (XALATAN) 0.005 % ophthalmic solution Place 1 drop into both eyes daily.  11  . metoprolol tartrate (LOPRESSOR) 25 MG tablet TAKE 1 TABLET BY MOUTH EVERY DAY *NEED OFFICE VISIT* 90 tablet 0  . Multiple Vitamin (MULTIVITAMIN WITH MINERALS) TABS tablet Take 1 tablet by mouth daily.    . NON FORMULARY Inhale 1 application into the lungs at bedtime. CPAP for apnea    . omega-3 acid ethyl esters (LOVAZA) 1 G capsule Take 1 g by mouth daily.     Jason [name] perphenazine-amitriptyline (ETRAFON/TRIAVIL) 4-25 MG TABS Take 1 tablet by mouth at bedtime.    . prednisoLONE acetate (PRED FORTE) 1 % ophthalmic suspension Place 1 drop into the right eye daily.    . ranitidine (ZANTAC) 150 MG tablet Take 150 mg by mouth daily.    . rosuvastatin (CRESTOR) 5 MG tablet Take 5 mg by mouth every other day. Every other day     . senna-docusate (SENOKOT-S) 8.6-50 MG per tablet Take 1 tablet by mouth at bedtime.     . vitamin B-12 (CYANOCOBALAMIN) 1000 MCG tablet Take 1,000 mcg by mouth daily.    Jason [name] warfarin (COUMADIN) 5 MG tablet Take 2.5-5 mg by mouth daily at 6 PM. Takes 1/2 tab on mon Takes 1 tab all other days     No current facility-administered medications for this visit.     REVIEW OF SYSTEMS:    10 Point review of Systems was done is negative except as noted above.  PHYSICAL EXAMINATION: ECOG PERFORMANCE STATUS: 2 - Symptomatic, <50% confined to bed  . Vitals:   08/10/16 1316  BP: 134/70  Pulse: (!) 103  Resp: 18  Temp: 98 F (36.7 C)    Filed Weights   08/10/16 1316  Weight: 183 lb 9.6 oz (83.3 kg)   .Body mass index is 25.61 kg/m.  GENERAL:alert, in no acute distress and comfortable SKIN: skin color, texture, turgor are normal, no rashes or significant lesions EYES: normal, conjunctiva are pink and non-injected, sclera clear OROPHARYNX:no exudate, no erythema and lips, buccal mucosa, and tongue normal  NECK: supple, no JVD, thyroid normal size, non-tender, without nodularity LYMPH:  no palpable lymphadenopathy in the cervical, axillary  or inguinal LUNGS: decreased AE bilateral, few scattered rhonci HEART: irregular s1s2 ABDOMEN: abdomen soft, non-tender, normoactive bowel sounds  Musculoskeletal: no cyanosis of digits and no clubbing  PSYCH: alert & oriented x 3 with fluent speech NEURO: no focal motor/sensory deficits  LABORATORY DATA:  I have reviewed the data as listed Component     Latest Ref Rng & Units 08/10/2016  WBC     4.0 - 10.3 10e3/uL 6.8  NEUT#     1.5 - 6.5 10e3/uL 4.7  Hemoglobin     13.0 - 17.1 g/dL 8.7 (L)  HCT     38.4 - 49.9 % 27.9 (L)  Platelets     140 - 400 10e3/uL 261  MCV     79.3 - 98.0 fL 93.9  MCH     27.2 - 33.4 pg 29.3  MCHC     32.0 - 36.0 g/dL 31.2 (L)  RBC     4.20 - 5.82 10e6/uL 2.97 (L)  RDW     11.0 - 14.6 % 14.7 (H)  lymph#     0.9 - 3.3 10e3/uL 1.0  MONO#     0.1 - 0.9 10e3/uL 0.7  Eosinophils Absolute     0.0 - 0.5 10e3/uL 0.3  Basophils Absolute     0.0 - 0.1 10e3/uL 0.1  NEUT%     39.0 - 75.0 % 69.0  LYMPH%     14.0 - 49.0 % 14.7  MONO%     0.0 - 14.0 % 9.9  EOS%     0.0 - 7.0 % 4.9  BASO%     0.0 - 2.0 % 1.5  Retic %     0.80 - 1.80 % 2.53 (H)  Retic Ct Abs     34.80 - 93.90 10e3/uL 75.14  Immature Retic Fract     3.00 - 10.60 % 23.30 (H)  Sodium     136 - 145 mEq/L 141  Potassium     3.5 - 5.1 mEq/L 4.3  Chloride     98 - 109 mEq/L 107  CO2     22 - 29 mEq/L 25  Glucose     70 - 140 mg/dl 97  BUN     7.0 - 26.0 mg/dL 15.5   Creatinine     0.7 - 1.3 mg/dL 1.1  Total Bilirubin     0.20 - 1.20 mg/dL 0.22  Alkaline Phosphatase     40 - 150 U/L 63  AST     5 - 34 U/L 17  ALT     0 - 55 U/L 16  Total Protein     6.4 - 8.3 g/dL 7.3  Albumin     3.5 - 5.0 g/dL 3.6  Calcium     8.4 - 10.4 mg/dL 8.9  Anion gap     3 - 11 mEq/L 8  EGFR     >90 ml/min/1.73 m2 61 (L)  Iron     42 - 163 ug/dL 25 (L)  TIBC     202 - 409 ug/dL 345  UIBC     117 - 376 ug/dL 320  %SAT     20 - 55 % 7 (L)  Ferritin     22 - 316 ng/ml 56   RADIOGRAPHIC STUDIES: I have personally reviewed the radiological images as listed and agreed with the findings in the report. Ct Abdomen Pelvis W Contrast  Result Date: 08/08/2016 CLINICAL DATA:  Right lower quadrant pain for 2 months, history  of prostate carcinoma EXAM: CT ABDOMEN AND PELVIS WITH CONTRAST TECHNIQUE: Multidetector CT imaging of the abdomen and pelvis was performed using the standard protocol following bolus administration of intravenous contrast. CONTRAST:  160m ISOVUE-300 IOPAMIDOL (ISOVUE-300) INJECTION 61% COMPARISON:  None. FINDINGS: Lower chest: No acute abnormality. Coronary calcifications are noted. Hepatobiliary: No focal liver abnormality is seen. No gallstones, gallbladder wall thickening, or biliary dilatation. Pancreas: Unremarkable. No pancreatic ductal dilatation or surrounding inflammatory changes. Spleen: Normal in size without focal abnormality. Adrenals/Urinary Tract: The adrenal glands and right kidney are within normal limits. A nonobstructing 5 mm stone is noted in the upper pole of the left kidney. No ureteral calculi are seen. The bladder is partially distended. Stomach/Bowel: Scattered diverticular change of the colon is noted. No diverticulitis is noted. The appendix appears have been surgically removed. Fullness in the cecum is seen which is likely related to fecal material although the possibility of a focal mass could not be totally excluded on this  exam. Vascular/Lymphatic: Aortic atherosclerosis. No enlarged abdominal or pelvic lymph nodes. Reproductive: Prostate is unremarkable. Other: No abdominal wall hernia or abnormality. No abdominopelvic ascites. Musculoskeletal: Degenerative change of the lumbar spine is noted. IMPRESSION: Nonobstructing left renal stone. Diverticulosis without diverticulitis. Fullness in the cecum which may represent fecal material although the possibility of an underlying mass could not be totally excluded. Further evaluation when patient is able is recommended. Electronically Signed   By: MInez CatalinaM.D.   On: 08/08/2016 15:48    ASSESSMENT & PLAN:   82yo with multiple medical co-morbids with   1) Normocytic Anemia - likely multifactorial. Appears to be primarily from Iron deficiency Anemia - likely from GI losses related to ongoing anticoagulation for afib and previous known small bowel and gastric AVM's and diverticulosis. No evidence of hemolysis SPEP neg Patient notes he has an increasing PSA level -followed with Dr GRisa Grill. If noted to have active prostate cancer this could add to anemia of chronic disease. -cannot r/o element of MDS given patients age. PLAN -transfuse prn for hgb<8 or if symptomatic. Not overtly symptomatic at this time. -hejust received IV Feraheme qweekly x 3 doses with improvement in hgb to 9.9 and then drop to 8.7 suggest active GI losses. -patient GI losses while on anticoagulation might be too significant to management with IV Iron and he might need significant PRBC transfusion. -continue f/u with Dr PHenrene Pastorto determine benefit of additional GI evaluation. -risk vs benefits of continued anticoagulation in the setting of chronic GI bleeding -- to be determined by PCP and cardiology.  -he will RTC with Dr KIrene Limboin 5-6 weeks with labs to need for additional IV Iron  2) Concerning for Cecal mass vs fecal material in cecum -GI will need to decide on consideration of onoscopy to r/o  cecal tumor (patient follows with Dr PHenrene Pastor.  3)Increasing PSA - concerning for progressive prostate cancer. Patient notes PSA level noted to be 55 on 07/24/2016. Bone scan on 05/11/2016 showed Subtle areas of increased uptake remain at the lumbar spine and LEFT Femur. CT abd /pelvis 12/12 - unremarkable prostate. -continue f/u with Urology - Dr GRisa Grill -might need abd  3) Patient Active Problem List   Diagnosis Date Noted  . Symptomatic anemia 08/22/2016  . Iron deficiency anemia due to chronic blood loss 06/29/2016  . Melena   . Angiodysplasia of stomach and duodenum without bleeding   . GIB (gastrointestinal bleeding) 09/14/2014  . Atrial fibrillation ([place] 07/15/2014  . PVC's (premature ventricular  contractions) 12/05/2012  . Fatigue 12/12/2011  . Heart palpitations 01/24/2011  . CAD (coronary artery disease)   . Aortic stenosis   . Dyslipidemia   . Depression   . ANXIETY 11/06/2008  . Aortic valve disorder 11/06/2008  . CONSTIPATION 11/06/2008  . IDIOPATHIC OSTEOPOROSIS 11/06/2008  . OSA (obstructive sleep apnea) 11/06/2008  . HEMORRHOIDS 11/05/2008  . DIVERTICULOSIS, COLON 11/05/2008  . Personal history of colonic polyps 11/05/2008   -continue other medical management with PCP  All of the patients questions were answered with apparent satisfaction. The patient knows to call the clinic with any problems, questions or concerns.  I spent 20 minutes counseling the patient face to face. The total time spent in the appointment was 25 minutes and more than 50% was on counseling and direct patient cares.    Sullivan Lone MD Valinda AAHIVMS [facility] Hematology/Oncology Physician [facility]  (Office):       [phone] (Work cell):  [phone] (Fax):           [phone]

## 2016-08-29 ENCOUNTER — Ambulatory Visit (INDEPENDENT_AMBULATORY_CARE_PROVIDER_SITE_OTHER): Payer: Medicare Other | Admitting: Gastroenterology

## 2016-08-29 ENCOUNTER — Encounter: Payer: Self-pay | Admitting: Gastroenterology

## 2016-08-29 VITALS — BP 126/72 | HR 82 | Ht 71.0 in | Wt 182.0 lb

## 2016-08-29 DIAGNOSIS — R1031 Right lower quadrant pain: Secondary | ICD-10-CM | POA: Diagnosis not present

## 2016-08-29 DIAGNOSIS — Z7901 Long term (current) use of anticoagulants: Secondary | ICD-10-CM

## 2016-08-29 DIAGNOSIS — R935 Abnormal findings on diagnostic imaging of other abdominal regions, including retroperitoneum: Secondary | ICD-10-CM

## 2016-08-29 DIAGNOSIS — D500 Iron deficiency anemia secondary to blood loss (chronic): Secondary | ICD-10-CM

## 2016-08-29 MED ORDER — NA SULFATE-K SULFATE-MG SULF 17.5-3.13-1.6 GM/177ML PO SOLN: 1.0000 | ORAL | 0 refills | Status: DC

## 2016-08-29 NOTE — Progress Notes (Signed)
Comprehensive consultation note reviewed. Agree with initial assessment and plans. Jess to follow up on final recommendation regarding coumadin

## 2016-08-29 NOTE — Patient Instructions (Signed)

## 2016-08-29 NOTE — Progress Notes (Signed)
08/29/2016 Jason [name] [id number] 03-Sep-1925   HISTORY OF PRESENT ILLNESS:  This is a pleasant 83 year old male who is known to Dr. Henrene Pastor. He's been evaluated for iron deficiency anemia in the past. Underwent EGD in January 2016 that showed a gastric AVM to  which APC was applied. He had a capsule endoscopy in July 2017 that showed some very tiny small bowel AVMs as well. His last colonoscopy was in 2010 at which time his found have diverticulosis and a rectal polyp that was a tubular adenoma. Repeat surveillance was not recommended due to age.  He presents to our office today at the request of his PCP, Dr. Joylene Draft, for evaluation regarding right lower quadrant discomfort and an abnormal CT scan. The patient and his wife tell me that he's been having some right lower quadrant discomfort for the past several months. The discomfort is constant, does not ever completely resolve with a bowel movement, etc. He says that it is not severe but always notices some soreness in that area. He had a CT scan of the abdomen and pelvis with contrast in December at which time it showed fullness in the cecum which was thought to possibly represent fecal material although possibility of underlying mass could not totally be excluded.  He continues to have ongoing iron deficiency anemia. Follows with hematology. Was previously on iron supplements, but most recently those were discontinued and he is receiving IV iron infusions. Denies seeing blood in his stools or black stools. Most recent hemoglobin is 8.4 grams.  He is on Coumadin due to having a bioprosthetic valve.   Past Medical History:  Diagnosis Date  . Abnormal PSA   . Adenomatous polyp of colon 2010 and before 2006  . Anemia   . Aortic stenosis   . Aortic valve prosthesis present   . Atrial fibrillation ([place])   . CAD (coronary artery disease)   . DDD (degenerative disc disease), lumbar   . Depression   . Diverticulosis   . Dyslipidemia   .  GERD (gastroesophageal reflux disease)   . Glaucoma   . Hx of CABG   . Hypertension   . Hypogonadism, male   . IFG (impaired fasting glucose)   . Macular degeneration   . OSA (obstructive sleep apnea)   . Osteoarthritis   . Osteopenia   . Prostate CA [facility])    Past Surgical History:  Procedure Laterality Date  . AORTIC VALVE REPLACEMENT  October 2011   Magna Ease pericardial tissue valve #74m  . BACK SURGERY    . CATARACT EXTRACTION    . CORNEAL TRANSPLANT    . CORONARY ARTERY BYPASS GRAFT  October 2011   LIMA to LAD  . ESOPHAGOGASTRODUODENOSCOPY N/A 09/16/2014   Procedure: ESOPHAGOGASTRODUODENOSCOPY (EGD);  Surgeon: JIrene Shipper MD;  [facility];  Service: Endoscopy;  Laterality: N/A;  . TONSILLECTOMY      reports that he quit smoking about 25 years ago. His smoking use included Cigarettes. He has a 40.00 pack-year smoking history. He has never used smokeless tobacco. He reports that he drinks about 1.2 oz of alcohol per week . He reports that he does not use drugs. family history includes Heart attack in his father. No Known Allergies    Outpatient Encounter Prescriptions as of 08/29/2016  Medication Sig  . acetaminophen (TYLENOL) 500 MG tablet Take 500 mg by mouth every 6 (six) hours as needed for moderate pain.   .[name] Kitchenamoxicillin (AMOXIL) 500 MG capsule FOR  DENTAL USED ONLY  . Calcium Carbonate-Vitamin D (CALCIUM-VITAMIN D) 500-200 MG-UNIT per tablet Take 1 tablet by mouth daily.  . Cholecalciferol 1000 UNITS capsule Take 1,000 Units by mouth daily.   . dorzolamide (TRUSOPT) 2 % ophthalmic solution PLACE 1 DROP INTO BOTH EYES 3 TIMES DAILY.  . fluticasone (FLONASE) 50 MCG/ACT nasal spray Place 1 spray into both nostrils as needed for allergies or rhinitis.  [name] glucosamine-chondroitin 500-400 MG tablet Take 1 tablet by mouth 2 (two) times daily.  [name] latanoprost (XALATAN) 0.005 % ophthalmic solution Place 1 drop into both eyes daily.  . metoprolol tartrate (LOPRESSOR) 25  MG tablet TAKE 1 TABLET BY MOUTH EVERY DAY *NEED OFFICE VISIT*  . Multiple Vitamin (MULTIVITAMIN WITH MINERALS) TABS tablet Take 1 tablet by mouth daily.  . NON FORMULARY Inhale 1 application into the lungs at bedtime. CPAP for apnea  . omega-3 acid ethyl esters (LOVAZA) 1 G capsule Take 1 g by mouth daily.   [name] perphenazine-amitriptyline (ETRAFON/TRIAVIL) 4-25 MG TABS Take 1 tablet by mouth at bedtime.  . prednisoLONE acetate (PRED FORTE) 1 % ophthalmic suspension Place 1 drop into the right eye daily.  . ranitidine (ZANTAC) 150 MG tablet Take 150 mg by mouth daily.  . rosuvastatin (CRESTOR) 5 MG tablet Take 5 mg by mouth every other day. Every other day   . senna-docusate (SENOKOT-S) 8.6-50 MG per tablet Take 1 tablet by mouth at bedtime.   . vitamin B-12 (CYANOCOBALAMIN) 1000 MCG tablet Take 1,000 mcg by mouth daily.  [name] warfarin (COUMADIN) 5 MG tablet Take 2.5-5 mg by mouth daily at 6 PM. Takes 1/2 tab on mon Takes 1 tab all other days  . Na Sulfate-K Sulfate-Mg Sulf 17.5-3.13-1.6 GM/180ML SOLN Take 1 kit by mouth as directed.   No facility-administered encounter medications on file as of 08/29/2016.      REVIEW OF SYSTEMS  : All other systems reviewed and negative except where noted in the History of Present Illness.   PHYSICAL EXAM: BP 126/72   Pulse 82   Ht 5' 11"  (1.803 m)   Wt 182 lb (82.6 kg)   BMI 25.38 kg/m  General: Well developed white male in no acute distress Head: Normocephalic and atraumatic Eyes:  Sclerae anicteric, conjunctiva pink. Ears: Normal auditory acuity Lungs: Clear throughout to auscultation Heart: Regular rate and rhythm Abdomen: Soft, non-distended.  Normal bowel sounds.  Mild TTP in RLQ. Rectal:  Will be done at the time of colonoscopy. Musculoskeletal: Symmetrical with no gross deformities  Skin: No lesions on visible extremities Extremities: No edema  Neurological: Alert oriented x 4, grossly non-focal Psychological:  Alert and cooperative. Normal  mood and affect  ASSESSMENT AND PLAN: *83 year old male with complaints of right lower quadrant discomfort and a CT scan showing abnormality in the cecum described as a fullness in the secondary to fecal material although underlying mass could not totally be excluded. *IDA:  Previously on oral iron supplements but recently received some IV iron infusions. Was with hematology. Question in part secondary to his gastrointestinal AVMs. *Gastric and small bowel AVM's with APC in the past *Chronic anticoagulation with coumadin due to bioprosthetic valve  **Think that with his complaints of discomfort and CT scan showing abnormality in that area along with his ongoing iron deficiency anemia that it is not unreasonable to proceed with colonoscopy. **Will hold coumadin for 5 days prior to endoscopic procedures - will instruct when and how to resume after procedure. Benefits and risks of procedure  explained including risks of bleeding, perforation, infection, missed lesions, reactions to medications and possible need for hospitalization and surgery for complications. Additional rare but real risk of stroke or other vascular clotting events off coumadin also explained and need to seek urgent help if any signs of these problems occur. Will communicate by phone or EMR with patient's prescribing provider, Dr. Joylene Draft, to confirm that holding coumadin is reasonable in this case.    CC:  [name], MD

## 2016-08-30 ENCOUNTER — Telehealth: Payer: Self-pay | Admitting: Emergency Medicine

## 2016-08-30 DIAGNOSIS — I480 Paroxysmal atrial fibrillation: Secondary | ICD-10-CM | POA: Diagnosis not present

## 2016-08-30 DIAGNOSIS — K922 Gastrointestinal hemorrhage, unspecified: Secondary | ICD-10-CM | POA: Diagnosis not present

## 2016-08-30 DIAGNOSIS — E611 Iron deficiency: Secondary | ICD-10-CM | POA: Diagnosis not present

## 2016-08-30 DIAGNOSIS — D6489 Other specified anemias: Secondary | ICD-10-CM | POA: Diagnosis not present

## 2016-08-30 DIAGNOSIS — Z6826 Body mass index (BMI) 26.0-26.9, adult: Secondary | ICD-10-CM | POA: Diagnosis not present

## 2016-08-30 NOTE — Telephone Encounter (Signed)
Received fax correspondence from Dr. Joylene Draft. Last dose of coumadin on January 5th then hold until colonoscopy and restart same day after procedure. Will have note scanned in to epic.   Spoke to patients wife and they verbalized understanding.

## 2016-09-06 ENCOUNTER — Encounter: Payer: Self-pay | Admitting: Internal Medicine

## 2016-09-06 ENCOUNTER — Other Ambulatory Visit (HOSPITAL_COMMUNITY): Payer: Self-pay | Admitting: *Deleted

## 2016-09-06 ENCOUNTER — Ambulatory Visit (AMBULATORY_SURGERY_CENTER): Payer: Medicare Other | Admitting: Internal Medicine

## 2016-09-06 VITALS — BP 119/58 | HR 87 | Temp 97.7°F | Resp 12 | Ht 71.0 in | Wt 182.0 lb

## 2016-09-06 DIAGNOSIS — R935 Abnormal findings on diagnostic imaging of other abdominal regions, including retroperitoneum: Secondary | ICD-10-CM | POA: Diagnosis not present

## 2016-09-06 DIAGNOSIS — D500 Iron deficiency anemia secondary to blood loss (chronic): Secondary | ICD-10-CM | POA: Diagnosis not present

## 2016-09-06 DIAGNOSIS — K639 Disease of intestine, unspecified: Secondary | ICD-10-CM | POA: Diagnosis not present

## 2016-09-06 DIAGNOSIS — K621 Rectal polyp: Secondary | ICD-10-CM | POA: Diagnosis not present

## 2016-09-06 DIAGNOSIS — R1031 Right lower quadrant pain: Secondary | ICD-10-CM

## 2016-09-06 DIAGNOSIS — D128 Benign neoplasm of rectum: Secondary | ICD-10-CM

## 2016-09-06 DIAGNOSIS — C20 Malignant neoplasm of rectum: Secondary | ICD-10-CM

## 2016-09-06 DIAGNOSIS — C180 Malignant neoplasm of cecum: Secondary | ICD-10-CM | POA: Diagnosis not present

## 2016-09-06 DIAGNOSIS — K6389 Other specified diseases of intestine: Secondary | ICD-10-CM

## 2016-09-06 MED ORDER — SODIUM CHLORIDE 0.9 % IV SOLN: 500.0000 mL | INTRAVENOUS | Status: DC

## 2016-09-06 NOTE — Op Note (Addendum)
[facility] Patient Name: Jason [name] Procedure Date: 09/06/2016 8:56 AM MRN: [id number] Endoscopist: Docia Chuck. Henrene Pastor , MD Age: 83 Referring MD:  Date of Birth: [birth year] Gender: Male Account #: [id number] Procedure:                Colonoscopy, with biopsies and cold snare                            polypectomy Indications:              Abdominal pain in the right lower quadrant, Iron                            deficiency anemia, Abnormal CT of the GI tract.                            Treated previously for AVMs. Prior colonoscopy with                            adenomatous polyps 2003 in 2006 (Dr. Velora Heckler) in                            2010 (Dr. Sharlett Iles) Medicines:                Monitored Anesthesia Care Procedure:                Pre-Anesthesia Assessment:                           - Prior to the procedure, a History and Physical                            was performed, and patient medications and                            allergies were reviewed. The patient's tolerance of                            previous anesthesia was also reviewed. The risks                            and benefits of the procedure and the sedation                            options and risks were discussed with the patient.                            All questions were answered, and informed consent                            was obtained. Prior Anticoagulants: The patient has                            taken Coumadin (warfarin), last dose was 5 days  prior to procedure. ASA Grade Assessment: III - A                            patient with severe systemic disease. After                            reviewing the risks and benefits, the patient was                            deemed in satisfactory condition to undergo the                            procedure.                           After obtaining informed consent, the colonoscope                            was  passed under direct vision. Throughout the                            procedure, the patient's blood pressure, pulse, and                            oxygen saturations were monitored continuously. The                            Colonoscope was introduced through the anus and                            advanced to the the cecum, identified by                            appendiceal orifice and ileocecal valve. The                            ileocecal valve, appendiceal orifice, and rectum                            were photographed. The quality of the bowel                            preparation was excellent. The colonoscopy was                            performed without difficulty. The patient tolerated                            the procedure well. The bowel preparation used was                            SUPREP. Scope In: 9:07:46 AM Scope Out: 9:19:47 AM Scope Withdrawal Time: 0 hours 8 minutes 46 seconds  Total Procedure Duration: 0 hours 12 minutes 1 second  Findings:  An ulcerated non-obstructing large mass was found                            in the cecum. The mass was non-circumferential. In                            addition, its diameter measured five mm. No                            bleeding was present. Biopsies were taken with a                            cold forceps for histology.                           A 4 mm polyp was found in the rectum. The polyp was                            removed with a cold snare. Resection and retrieval                            were complete.                           Multiple diverticula were found in the left colon.                           Internal hemorrhoids were found during retroflexion.                           The exam was otherwise without abnormality on                            direct and retroflexion views. Complications:            No immediate complications. Estimated blood loss:                             None. Estimated Blood Loss:     Estimated blood loss: none. Impression:               - Malignant-appearing tumor in the colon. Biopsied.                           - One benign appearing 4 mm polyp in the rectum,                            removed with a cold snare. Resected and retrieved.                           - Moderate diverticulosis in the left colon. Recommendation:           1. Resume Coumadin today                           2.  Refer to general surgery for right hemicolectomy                           3. Keep plans for transfusion as already arranged                           4. Continue other medications                           5. Await pathology results. Docia Chuck. Henrene Pastor, MD 09/06/2016 9:37:20 AM This report has been signed electronically.

## 2016-09-06 NOTE — Patient Instructions (Signed)
Impression/Recommendations:  Diverticulosis handout given to patient.  Resume Coumadin today. Continue other medications.  Keep plans for transfusion as already arranged.   Refer to general surgery for right hemicolectomy.  Await pathology results.  YOU HAD AN ENDOSCOPIC PROCEDURE TODAY AT [facility]:   Refer to the procedure report that was given to you for any specific questions about what was found during the examination.  If the procedure report does not answer your questions, please call your gastroenterologist to clarify.  If you requested that your care partner not be given the details of your procedure findings, then the procedure report has been included in a sealed envelope for you to review at your convenience later.  YOU SHOULD EXPECT: Some feelings of bloating in the abdomen. Passage of more gas than usual.  Walking can help get rid of the air that was put into your GI tract during the procedure and reduce the bloating. If you had a lower endoscopy (such as a colonoscopy or flexible sigmoidoscopy) you may notice spotting of blood in your stool or on the toilet paper. If you underwent a bowel prep for your procedure, you may not have a normal bowel movement for a few days.  Please Note:  You might notice some irritation and congestion in your nose or some drainage.  This is from the oxygen used during your procedure.  There is no need for concern and it should clear up in a day or so.  SYMPTOMS TO REPORT IMMEDIATELY:   Following lower endoscopy (colonoscopy or flexible sigmoidoscopy):  Excessive amounts of blood in the stool  Significant tenderness or worsening of abdominal pains  Swelling of the abdomen that is new, acute  Fever of 100F or higher  For urgent or emergent issues, a gastroenterologist can be reached at any hour by calling [phone].   DIET:  We do recommend a small meal at first, but then you may proceed to your regular diet.  Drink  plenty of fluids but you should avoid alcoholic beverages for 24 hours.  ACTIVITY:  You should plan to take it easy for the rest of today and you should NOT DRIVE or use heavy machinery until tomorrow (because of the sedation medicines used during the test).    FOLLOW UP: Our staff will call the number listed on your records the next business day following your procedure to check on you and address any questions or concerns that you may have regarding the information given to you following your procedure. If we do not reach you, we will leave a message.  However, if you are feeling well and you are not experiencing any problems, there is no need to return our call.  We will assume that you have returned to your regular daily activities without incident.  If any biopsies were taken you will be contacted by phone or by letter within the next 1-3 weeks.  Please call us at [phone] if you have not heard about the biopsies in 3 weeks.    SIGNATURES/CONFIDENTIALITY: You and/or your care partner have signed paperwork which will be entered into your electronic medical record.  These signatures attest to the fact that that the information above on your After Visit Summary has been reviewed and is understood.  Full responsibility of the confidentiality of this discharge information lies with you and/or your care-partner.

## 2016-09-06 NOTE — Progress Notes (Signed)
Spontaneous respirations throughout. VSS. Resting comfortably. To PACU on room air. Report to  Jane RN. 

## 2016-09-06 NOTE — Progress Notes (Signed)
Called to room to assist during endoscopic procedure.  Patient ID and intended procedure confirmed with present staff. Received instructions for my participation in the procedure from the performing physician.  

## 2016-09-07 ENCOUNTER — Telehealth: Payer: Self-pay

## 2016-09-07 ENCOUNTER — Ambulatory Visit (HOSPITAL_COMMUNITY)
Admission: RE | Admit: 2016-09-07 | Discharge: 2016-09-07 | Disposition: A | Discharge status: Home / Self Care | Payer: Medicare Other | Source: Ambulatory Visit | Attending: Internal Medicine | Admitting: Internal Medicine

## 2016-09-07 DIAGNOSIS — D649 Anemia, unspecified: Secondary | ICD-10-CM | POA: Diagnosis not present

## 2016-09-07 LAB — PREPARE RBC (CROSSMATCH)

## 2016-09-07 MED ORDER — ACETAMINOPHEN 325 MG PO TABS
ORAL_TABLET | ORAL | Status: AC
  Filled 2016-09-07: qty 2

## 2016-09-07 MED ORDER — ACETAMINOPHEN 325 MG PO TABS
650.0000 mg | ORAL_TABLET | Freq: Once | ORAL | Status: AC
  Administered 2016-09-07: 650 mg via ORAL

## 2016-09-07 MED ORDER — SODIUM CHLORIDE 0.9 % IV SOLN: Freq: Once | INTRAVENOUS | Status: DC

## 2016-09-07 NOTE — Telephone Encounter (Signed)
  Follow up Call-  Call back number 09/06/2016  Post procedure Call Back phone  # [phone]  Permission to leave phone message Yes  Some recent data might be hidden     Patient questions:  Do you have a fever, pain , or abdominal swelling? No. Pain Score  0 *  Have you tolerated food without any problems? Yes.    Have you been able to return to your normal activities? Yes.    Do you have any questions about your discharge instructions: Diet   No. Medications  No. Follow up visit  No.  Do you have questions or concerns about your Care? No.  Actions: * If pain score is 4 or above: No action needed, pain <4.

## 2016-09-07 NOTE — Telephone Encounter (Signed)
  Follow up Call-  Call back number 09/06/2016  Post procedure Call Back phone  # [phone]  Permission to leave phone message Yes  Some recent data might be hidden    Patient was called for follow up after his procedure on 09/06/2016. No answer at the number given for follow up phone call. A message was left on the answering machine.

## 2016-09-08 DIAGNOSIS — H401111 Primary open-angle glaucoma, right eye, mild stage: Secondary | ICD-10-CM | POA: Diagnosis not present

## 2016-09-08 DIAGNOSIS — H401123 Primary open-angle glaucoma, left eye, severe stage: Secondary | ICD-10-CM | POA: Diagnosis not present

## 2016-09-08 LAB — TYPE AND SCREEN
ABO/RH(D): A NEG
Antibody Screen: NEGATIVE
UNIT DIVISION: 0
Unit division: 0

## 2016-09-12 ENCOUNTER — Other Ambulatory Visit: Payer: Self-pay | Admitting: Cardiology

## 2016-09-12 NOTE — Telephone Encounter (Signed)
Rx(s) sent to pharmacy electronically.  

## 2016-09-13 ENCOUNTER — Telehealth: Payer: Self-pay | Admitting: *Deleted

## 2016-09-13 ENCOUNTER — Other Ambulatory Visit: Payer: Medicare Other

## 2016-09-13 ENCOUNTER — Ambulatory Visit: Payer: Medicare Other | Admitting: Hematology

## 2016-09-13 NOTE — Telephone Encounter (Signed)
Sw patient regarding apts today.  Due to inclement weather, pt wishes to reschedule.  Pt stated recent colonoscopy shows possible cancer.  Pt has surgical consult on 1/29.  Will review with Dr. Irene Limbo to see what to do in terms of scheduling.  Will call pt to scheduled new apt after review with Dr. Irene Limbo.

## 2016-09-14 ENCOUNTER — Encounter: Payer: Self-pay | Admitting: Internal Medicine

## 2016-09-15 ENCOUNTER — Telehealth: Payer: Self-pay

## 2016-09-15 DIAGNOSIS — E611 Iron deficiency: Secondary | ICD-10-CM | POA: Diagnosis not present

## 2016-09-15 DIAGNOSIS — Z7901 Long term (current) use of anticoagulants: Secondary | ICD-10-CM | POA: Diagnosis not present

## 2016-09-15 NOTE — Telephone Encounter (Signed)
-----   Message from [name], MD sent at 09/14/2016  3:13 PM EST ----- Regarding: Biopsy results  Vaughan Basta, Please let patient know that his biopsy confirmed colon cancer as expected. He should certainly be aware. Keep plans for surgical consultation. I will send him a letter as well. Thanks

## 2016-09-15 NOTE — Telephone Encounter (Signed)
Patient notified.  He confirmed appt for Monday with the surgeon

## 2016-09-25 ENCOUNTER — Ambulatory Visit: Payer: Self-pay | Admitting: Surgery

## 2016-09-25 ENCOUNTER — Telehealth: Payer: Self-pay

## 2016-09-25 DIAGNOSIS — C180 Malignant neoplasm of cecum: Secondary | ICD-10-CM | POA: Diagnosis not present

## 2016-09-25 DIAGNOSIS — Z01818 Encounter for other preprocedural examination: Secondary | ICD-10-CM | POA: Diagnosis not present

## 2016-09-25 NOTE — H&P (Signed)
Jason [name] 09/25/2016 10:13 AM Location: [facility] Patient #: [id number] DOB: [birth year] Married / Language: English / Race: White Male  Patient Care Team: [name], [name] as PCP - General (Internal Medicine) [name], [name] as Consulting Physician (General Surgery) [name], [name] as Consulting Physician (Gastroenterology) [name], [name] as Consulting Physician (Cardiology)   History of Present Illness Jason [name]; 09/25/2016 5:38 PM) The patient is a 83 year old male who presents with colorectal cancer. Note for "Colorectal cancer": Patient sent for surgical consultation by his gastroenterologist, [name] with [facility] gastroenterology. 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  Pleasant elderly gentleman. Comes today with his wife. Elderly but does work out at [employer] with exercise classes. Required aortic valve replacement surgery by [name] in 2011. Has atrial fibrillation. Good ejection fraction normal 2015 according to his cardiologist Dr. Martinique. Struggled with iron deficiency anemia. Has had endoscopies in the past. Colon in past had been underwhelming. 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 Still has persistent anemia. Had some abdominal pain. Prostate cancer that seems to be very slow growing and monitored at this point. No seeds or radiation or surgery. Follwed by Dr Risa Grill.  Because of abdominal pain, he underwent a CAT scan last month. Mass at cecum. ? tumor versus stool. Colonocscopy done by Dr Henrene Pastor & found to have a cecal tumor. Biopsy consistent with adenocarcinoma. No obvious metastatic disease in abdomen and pelvis. Surgical consultation requested. Patient claims to able to walk relatively well. Takes exercise classes. Bleeding on Xerelto so has been on warfarin. INR around 2. Not smoke cigarettes in over 25 years. Glasses of wine a week. He is hard of hearing but no balancer fall issues. No  abdominal surgery. I blood transfusions and iron infusions in the past month or so due to worsening anemia.  No personal nor family history of inflammatory bowel disease, irritable bowel syndrome, allergy such as Celiac Sprue, dietary/dairy problems, colitis, ulcers nor gastritis. No recent sick contacts/gastroenteritis. No travel outside the country. No changes in diet. No dysphagia to solids or liquids. No significant heartburn or reflux. No hematochezia, hematemesis, coffee ground emesis. No evidence of prior gastric/peptic ulceration.   Past Surgical History ([name], [name]; 09/25/2016 10:13 AM) Cataract Surgery Bilateral. Colon Polyp Removal - Colonoscopy Coronary Artery Bypass Graft Spinal Surgery - Lower Back Valve Replacement  Allergies ([name], [name]; 09/25/2016 10:13 AM) No Known Drug Allergies 09/25/2016  Medication History ([name], [name]; 09/25/2016 10:28 AM) Acetaminophen (500MG  Tablet, Oral) Active. Calcium w/Vitamin D & Iron (Oral) Active. Dorzolamide HCl (2% Solution, Ophthalmic) Active. Flonase (50MCG/ACT Suspension, Nasal) Active. Glucosamine Chondroitin Complx (500-400MG  Tablet, Oral) Active. Metoprolol Tartrate (25MG  Tablet, Oral) Active. Multiple Vitamin (Oral) Active. Omega-3-acid Ethyl Esters (1GM Capsule, Oral) Active. Perphenazine-Amitriptyline (4-25MG  Tablet, Oral) Active. PrednisoLONE Acetate (1% Suspension, Ophthalmic) Active. Rosuvastatin Calcium (5MG  Tablet, Oral) Active. Warfarin Sodium (5MG  Tablet, Oral) Active. ICaps Lutein & Zeaxanthin (Oral) Active. Vitamin B-12 (1000MCG Tablet, Oral) Active.  Social History ([name], [name]; 09/25/2016 10:13 AM) Alcohol use Moderate alcohol use. Caffeine use Coffee. No drug use Tobacco use Former smoker.  Family History ([name], [name]; 09/25/2016 10:13 AM) Alcohol Abuse Father. Arthritis Mother, Sister. Breast Cancer Mother. Depression Mother. Heart Disease  Father. Heart disease in male family member before age 20  Other Problems ([name], [name]; 09/25/2016 10:13 AM) Arthritis Atrial Fibrillation Depression Gastroesophageal Reflux Disease Hemorrhoids Hypercholesterolemia Kidney Stone Prostate Cancer Sleep Apnea Transfusion history  Review of Systems ([name] [name]; 09/25/2016 10:13 AM) General Present- Fatigue. Not Present- Appetite Loss, Chills, Fever, Night Sweats, Weight Gain and Weight Loss. Skin Not Present- Change in Wart/Mole, Dryness, Hives, Jaundice, New Lesions, Non-Healing Wounds, Rash and Ulcer. HEENT Present- Hearing Loss, Seasonal Allergies and Wears glasses/contact lenses. Not Present- Earache, Hoarseness, Nose Bleed, Oral Ulcers, Ringing in the Ears, Sinus Pain, Sore Throat, Visual Disturbances and Yellow Eyes. Respiratory Not Present- Bloody sputum, Chronic Cough, Difficulty Breathing, Snoring and Wheezing. Breast Not Present- Breast Mass, Breast Pain, Nipple Discharge and Skin Changes. Cardiovascular Present- Shortness of Breath. Not Present- Chest Pain, Difficulty Breathing Lying Down, Leg Cramps, Palpitations, Rapid Heart Rate and Swelling of Extremities. Gastrointestinal Present- Abdominal Pain, Change in Bowel Habits and Excessive gas. Not Present- Bloating, Bloody Stool, Chronic diarrhea, Constipation, Difficulty Swallowing, Gets full quickly at meals, Hemorrhoids, Indigestion, Nausea, Rectal Pain and Vomiting. Male Genitourinary Present- Impotence and Nocturia. Not Present- Blood in Urine, Change in Urinary Stream, Frequency, Painful Urination, Urgency and Urine Leakage. Musculoskeletal Present- Back Pain and Joint Pain. Not Present- Joint Stiffness, Muscle Pain, Muscle Weakness and Swelling of Extremities. Neurological Not Present- Decreased Memory, Fainting, Headaches, Numbness, Seizures, Tingling, Tremor, Trouble walking and Weakness. Psychiatric Present- Depression. Not Present- Anxiety,  Bipolar, Change in Sleep Pattern, Fearful and Frequent crying. Endocrine Not Present- Cold Intolerance, Excessive Hunger, Hair Changes, Heat Intolerance, Hot flashes and New Diabetes. Hematology Present- Blood Thinners. Not Present- Easy Bruising, Excessive bleeding, Gland problems, HIV and Persistent Infections.  Vitals ([name] [name]; 09/25/2016 10:19 AM) 09/25/2016 10:18 AM Weight: 180.13 lb Height: 71in Body Surface Area: 2.02 m Body Mass Index: 25.12 kg/m  Temp.: 98.39F(Oral)  Pulse: 81 (Regular)  BP: 130/72 (Sitting, Left Arm, Standard)      Physical Exam Jason [name]; 09/25/2016 11:40 AM)  General Mental Status-Alert. General Appearance-Not in acute distress, Not Sickly. Orientation-Oriented X3. Hydration-Well hydrated. Voice-Normal.  Integumentary Global Assessment Upon inspection and palpation of skin surfaces of the - Axillae: non-tender, no inflammation or ulceration, no drainage. and Distribution of scalp and body hair is normal. General Characteristics Temperature - normal warmth is noted.  Head and Neck Head-normocephalic, atraumatic with no lesions or palpable masses. Face Global Assessment - atraumatic, no absence of expression. Neck Global Assessment - no abnormal movements, no bruit auscultated on the right, no bruit auscultated on the left, no decreased range of motion, non-tender. Trachea-midline. Thyroid Gland Characteristics - non-tender.  Eye Eyeball - Left-Extraocular movements intact, No Nystagmus. Eyeball - Right-Extraocular movements intact, No Nystagmus. Cornea - Left-No Hazy. Cornea - Right-No Hazy. Sclera/Conjunctiva - Left-No scleral icterus, No Discharge. Sclera/Conjunctiva - Right-No scleral icterus, No Discharge. Pupil - Left-Direct reaction to light normal. Pupil - Right-Direct reaction to light normal. Note: Wears glasses. Vision corrected  ENMT Ears Pinna - Left - no drainage  observed, no generalized tenderness observed. Right - no drainage observed, no generalized tenderness observed. Nose and Sinuses External Inspection of the Nose - no destructive lesion observed. Inspection of the nares - Left - quiet respiration. Right - quiet respiration. Mouth and Throat Lips - Upper Lip - no fissures observed, no pallor noted. Lower Lip - no fissures observed, no pallor noted. Nasopharynx - no discharge present. Oral Cavity/Oropharynx - Tongue - no dryness observed. Oral Mucosa - no cyanosis observed. Hypopharynx - no evidence of airway distress observed. Note: Hard of hearing  Chest and Lung Exam Inspection Movements - Normal and Symmetrical. Accessory muscles - No use of accessory muscles in breathing. Palpation  Palpation of the chest reveals - Non-tender. Auscultation Breath sounds - Normal and Clear.  Cardiovascular Auscultation Rhythm - Regular. Murmurs & Other Heart Sounds - Auscultation of the heart reveals - No Murmurs and No Systolic Clicks.  Abdomen Inspection Inspection of the abdomen reveals - No Visible peristalsis and No Abnormal pulsations. Umbilicus - No Bleeding, No Urine drainage. Palpation/Percussion Palpation and Percussion of the abdomen reveal - Soft, Non Tender, No Rebound tenderness, No Rigidity (guarding) and No Cutaneous hyperesthesia. Note: Abdomen soft. Nontender, nondistended. No guarding. No diastasis. No umbilical nor other hernias  Male Genitourinary Sexual Maturity Tanner 5 - Adult hair pattern and Adult penile size and shape. Note: No inguinal hernias. Normal external genitalia. Epididymi, testes, and spermatic cords normal without any masses.  Peripheral Vascular Upper Extremity Inspection - Left - No Cyanotic nailbeds, Not Ischemic. Right - No Cyanotic nailbeds, Not Ischemic.  Neurologic Neurologic evaluation reveals -normal attention span and ability to concentrate, able to name objects and repeat phrases. Appropriate  fund of knowledge , normal sensation and normal coordination. Mental Status Affect - not angry, not paranoid. Cranial Nerves-Normal Bilaterally. Gait-Normal.  Neuropsychiatric Mental status exam performed with findings of-able to articulate well with normal speech/language, rate, volume and coherence, thought content normal with ability to perform basic computations and apply abstract reasoning and no evidence of hallucinations, delusions, obsessions or homicidal/suicidal ideation.  Musculoskeletal Global Assessment Spine, Ribs and Pelvis - no instability, subluxation or laxity. Right Upper Extremity - no instability, subluxation or laxity.  Lymphatic Head & Neck  General Head & Neck Lymphatics: Bilateral - Description - No Localized lymphadenopathy. Axillary  General Axillary Region: Bilateral - Description - No Localized lymphadenopathy. Femoral & Inguinal  Generalized Femoral & Inguinal Lymphatics: Left - Description - No Localized lymphadenopathy. Right - Description - No Localized lymphadenopathy.    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  Standard of care would be metastatic workup and probable segmental colonic resection.  CT of abdomen and pelvis done last month otherwise negative. Would complete with CT of chest with IV contrast. Also order CEA level.  As stated, while he is of advanced age he is actually quite functional and active. I think he he has many more years of life in him for that cancer to get him into trouble. No evidence of metastatic disease, so potentially curable with surgery only. Be a good candidate for a minimally invasive approach such as robotic/laparoscopic resection with intracorporeal anastomosis.  I definitely would like cardiac clearance first from Dr. Martinique. Make sure it is safe to  come off of warfarin. He is able to hold that and not need a Lovenox bridge for the colonoscopy. The patient did have an echocardiogram done in the past 2 years but 2015 showed good LV functioning and aortic valve prosthesis working well.  I discussed at length with the patient and his wife. They wish to be aggressive and proceed with surgery but deftly want to make sure that primary care physician and cardiology are involved.  Current Plans I recommended obtaining preoperative cardiac clearance and recommendations on management of anticoagualtion perioperatively:  1. Timing of holding anticoagulation 2. Need for any bridge therapy (SQ enoxaparin, IV heparin, IV Aggrastat, etc) preop/postop. 3. Desired timing of resumption of anticoagulation.  In general from Dr. Johney Maine' standpoint:  Aspirin is okay to continue perioperatively (81mg  or 325mg ) &  does not need to be held  Hold warfarin 5 dayspreoperatively. Consider PT/INR level on arrival to short stay the day of surgery. No need to check PT/INR level on preop visit  Hold P2Y12 inibitors such as clopidrogel (Plavix) 4 dayspreoperatively  Hold direct thrombin / factor Xa inhibitors (Xerelto, Pradaxa, Eliquis, etc) 2 dayspreoperatively   Request clearance by cardiology to better assess operative risk & see if a reevaluation, further workup, etc is needed. Also recommendations on how medications such as for anticoagulation and blood pressure should be managed/held/restarted after surgery.  PREOP COLON - ENCOUNTER FOR PREOPERATIVE EXAMINATION FOR GENERAL SURGICAL PROCEDURE (Z01.818)  Current Plans You are being scheduled for surgery- Our schedulers will call you.  You should hear from our office's scheduling department within 5 working days about the location, date, and time of surgery. We try to make accommodations for patient's preferences in scheduling surgery, but sometimes the OR schedule or the surgeon's  schedule prevents [place] from making those accommodations.  If you have not heard from our office [phone]) in 5 working days, call the office and ask for your surgeon's nurse.  If you have other questions about your diagnosis, plan, or surgery, call the office and ask for your surgeon's nurse.  Written instructions provided Pt Education - [facility] Colon Bowel Prep 2015 Miralax/Antibiotics Started Neomycin Sulfate 500MG , 2 (two) Tablet SEE NOTE, #6, 09/25/2016, No Refill. Local Order: TAKE TWO TABLETS AT 2 PM, 3 PM, AND 10 PM THE DAY PRIOR TO SURGERY Started Flagyl 500MG , 2 (two) Tablet SEE NOTE, #6, 09/25/2016, No Refill. Local Order: Take at 2pm, 3pm, and 10pm the day prior to your colon operation Pt Education - [facility] Colectomy post-op instructions: discussed with patient and provided information. Pt Education - [facility] Good Bowel Health (Earlisha Sharples) Pt Education - [facility] Pain Control (Loralyn Rachel) Pt Education - Pamphlet Given - Laparoscopic Colorectal Surgery: discussed with patient and provided information.  Jason Hector, M.D., F.A.C.S. Gastrointestinal and Minimally Invasive Surgery [facility] [address], [place], [place] [address] [phone] Main / Paging

## 2016-09-25 NOTE — Telephone Encounter (Signed)
Received surgical clearance from [facility].Dr.Jordan cleared patient for upcoming surgery.Advised may hold coumadin 5 days prior to surgery.Form faxed back to fax # [phone].

## 2016-09-26 ENCOUNTER — Telehealth: Payer: Self-pay | Admitting: Cardiology

## 2016-09-26 ENCOUNTER — Other Ambulatory Visit: Payer: Self-pay | Admitting: Surgery

## 2016-09-26 DIAGNOSIS — H3570 Unspecified separation of retinal layers: Secondary | ICD-10-CM | POA: Diagnosis not present

## 2016-09-26 DIAGNOSIS — H35363 Drusen (degenerative) of macula, bilateral: Secondary | ICD-10-CM | POA: Diagnosis not present

## 2016-09-26 DIAGNOSIS — H4720 Unspecified optic atrophy: Secondary | ICD-10-CM | POA: Diagnosis not present

## 2016-09-26 DIAGNOSIS — H353134 Nonexudative age-related macular degeneration, bilateral, advanced atrophic with subfoveal involvement: Secondary | ICD-10-CM | POA: Diagnosis not present

## 2016-09-26 DIAGNOSIS — C180 Malignant neoplasm of cecum: Secondary | ICD-10-CM

## 2016-09-26 DIAGNOSIS — H35361 Drusen (degenerative) of macula, right eye: Secondary | ICD-10-CM | POA: Diagnosis not present

## 2016-09-26 NOTE — Telephone Encounter (Signed)
I already communicated with Dr. Gross  Galileo Colello Martinique MD, [facility]

## 2016-09-26 NOTE — Telephone Encounter (Signed)
Spoke with pt wife, she reports that dr gross is going to send something to [place] to get clearance. They are planning to remove part of the colon laparoscopically.  They are aware of the risk due to his atrial fib and valve but they would like to know dr jordan's option. Offered patient appt with dr Martinique to discuss but he declined. Will forward for dr jordan's review and advise.

## 2016-09-26 NOTE — Telephone Encounter (Signed)
New Message  Pts wife voiced pt was diagnosed with colon cancer and he has an heart condition and she is needing advice on the risk.  Please f/u

## 2016-09-28 NOTE — Telephone Encounter (Signed)
Returned call to patient's wife.She wanted to know if Dr.Jordan cleared husband to have upcoming colon surgery.Advised I will speak to Dr.Jordan 09/29/16 and call her back.

## 2016-09-29 DIAGNOSIS — Z9289 Personal history of other medical treatment: Secondary | ICD-10-CM

## 2016-09-29 HISTORY — DX: Personal history of other medical treatment: Z92.89

## 2016-09-29 NOTE — Telephone Encounter (Signed)
Returned call to patient's wife Dr.Jordan advised ok to have upcoming surgery.

## 2016-10-02 ENCOUNTER — Ambulatory Visit
Admission: RE | Admit: 2016-10-02 | Discharge: 2016-10-02 | Disposition: A | Discharge status: Home / Self Care | Payer: Medicare Other | Source: Ambulatory Visit | Attending: Surgery | Admitting: Surgery

## 2016-10-02 DIAGNOSIS — R918 Other nonspecific abnormal finding of lung field: Secondary | ICD-10-CM | POA: Diagnosis not present

## 2016-10-02 DIAGNOSIS — C180 Malignant neoplasm of cecum: Secondary | ICD-10-CM | POA: Diagnosis not present

## 2016-10-02 DIAGNOSIS — C61 Malignant neoplasm of prostate: Secondary | ICD-10-CM | POA: Diagnosis not present

## 2016-10-02 MED ORDER — IOPAMIDOL (ISOVUE-300) INJECTION 61%
75.0000 mL | Freq: Once | INTRAVENOUS | Status: AC | PRN
  Administered 2016-10-02: 75 mL via INTRAVENOUS

## 2016-10-05 DIAGNOSIS — D6489 Other specified anemias: Secondary | ICD-10-CM | POA: Diagnosis not present

## 2016-10-05 DIAGNOSIS — Z7901 Long term (current) use of anticoagulants: Secondary | ICD-10-CM | POA: Diagnosis not present

## 2016-10-06 ENCOUNTER — Other Ambulatory Visit (HOSPITAL_COMMUNITY): Payer: Self-pay | Admitting: *Deleted

## 2016-10-06 ENCOUNTER — Ambulatory Visit (HOSPITAL_COMMUNITY)
Admission: RE | Admit: 2016-10-06 | Discharge: 2016-10-06 | Disposition: A | Discharge status: Home / Self Care | Payer: Medicare Other | Source: Ambulatory Visit | Attending: Internal Medicine | Admitting: Internal Medicine

## 2016-10-06 DIAGNOSIS — D649 Anemia, unspecified: Secondary | ICD-10-CM | POA: Insufficient documentation

## 2016-10-06 LAB — PREPARE RBC (CROSSMATCH)

## 2016-10-09 ENCOUNTER — Ambulatory Visit (HOSPITAL_COMMUNITY)
Admission: RE | Admit: 2016-10-09 | Discharge: 2016-10-09 | Disposition: A | Discharge status: Home / Self Care | Payer: Medicare Other | Source: Ambulatory Visit | Attending: Internal Medicine | Admitting: Internal Medicine

## 2016-10-09 DIAGNOSIS — D649 Anemia, unspecified: Secondary | ICD-10-CM | POA: Insufficient documentation

## 2016-10-09 MED ORDER — ACETAMINOPHEN 325 MG PO TABS
ORAL_TABLET | ORAL | Status: AC
  Filled 2016-10-09: qty 2

## 2016-10-09 MED ORDER — ACETAMINOPHEN 325 MG PO TABS: 650.0000 mg | ORAL_TABLET | Freq: Once | ORAL | Status: DC

## 2016-10-09 MED ORDER — SODIUM CHLORIDE 0.9 % IV SOLN: Freq: Once | INTRAVENOUS | Status: DC

## 2016-10-10 LAB — TYPE AND SCREEN
ABO/RH(D): A NEG
ANTIBODY SCREEN: NEGATIVE
UNIT DIVISION: 0
Unit division: 0

## 2016-10-13 ENCOUNTER — Other Ambulatory Visit (HOSPITAL_COMMUNITY): Payer: Self-pay | Admitting: *Deleted

## 2016-10-13 ENCOUNTER — Inpatient Hospital Stay (HOSPITAL_COMMUNITY): Admission: RE | Admit: 2016-10-13 | Payer: Medicare Other | Source: Ambulatory Visit

## 2016-10-13 NOTE — Patient Instructions (Addendum)
Jason [name]  10/13/2016   Your procedure is scheduled on: 10-24-16  Report to [facility] Main  Entrance take [facility]  elevators to 3rd floor to  [place] at 530 AM.  Call this number if you have problems the morning of surgery [phone]  BRING CPAP MASK AND TUBING     Remember: ONLY 1 PERSON MAY GO WITH YOU TO SHORT STAY TO GET  READY MORNING OF YOUR SURGERY.  Do not eat food  :After Midnight Sunday night clear liquids all day Monday 10-23-16 and follow dr gross bowel prep instructions. No clear liquids after midnight Monday night.     Take these medicines the morning of surgery with A SIP OF WATER: EYE DROPS, FLONASE NASAL SPRAY IF NEEDED              You may not have any metal on your body including hair pins and              piercings  Do not wear jewelry, make-up, lotions, powders or perfumes, deodorant             Do not wear nail polish.  Do not shave  48 hours prior to surgery.              Men may shave face and neck.   Do not bring valuables to the hospital. [place].  Contacts, dentures or bridgework may not be worn into surgery.  Leave suitcase in the car. After surgery it may be brought to your room.                  Please read over the following fact sheets you were given: _____________________________________________________________________                CLEAR LIQUID DIET   Foods Allowed                                                                     Foods Excluded  Coffee and tea, regular and decaf                             liquids that you cannot  Plain Jell-O in any flavor                                             see through such as: Fruit ices (not with fruit pulp)                                     milk, soups, orange juice  Iced Popsicles  All solid food Carbonated beverages, regular and diet                                     Cranberry, grape and apple juices Sports drinks like Gatorade Lightly seasoned clear broth or consume(fat free) Sugar, honey syrup  Sample Menu Breakfast                                Lunch                                     Supper Cranberry juice                    Beef broth                            Chicken broth Jell-O                                     Grape juice                           Apple juice Coffee or tea                        Jell-O                                      Popsicle                                                Coffee or tea                        Coffee or tea  _____________________________________________________________________  [facility] - Preparing for Surgery Before surgery, you can play an important role.  Because skin is not sterile, your skin needs to be as free of germs as possible.  You can reduce the number of germs on your skin by washing with CHG (chlorahexidine gluconate) soap before surgery.  CHG is an antiseptic cleaner which kills germs and bonds with the skin to continue killing germs even after washing. Please DO NOT use if you have an allergy to CHG or antibacterial soaps.  If your skin becomes reddened/irritated stop using the CHG and inform your nurse when you arrive at Short Stay. Do not shave (including legs and underarms) for at least 48 hours prior to the first CHG shower.  You may shave your face/neck. Please follow these instructions carefully:  1.  Shower with CHG Soap the night before surgery and the  morning of Surgery.  2.  If you choose to wash your hair, wash your hair first as usual with your  normal  shampoo.  3.  After you shampoo, rinse your hair and body thoroughly to remove the  shampoo.  4.  Use CHG as you would any other liquid soap.  You can apply chg directly  to the skin and wash                       Gently with a scrungie or clean washcloth.  5.  Apply the CHG  Soap to your body ONLY FROM THE NECK DOWN.   Do not use on face/ open                           Wound or open sores. Avoid contact with eyes, ears mouth and genitals (private parts).                       Wash face,  Genitals (private parts) with your normal soap.             6.  Wash thoroughly, paying special attention to the area where your surgery  will be performed.  7.  Thoroughly rinse your body with warm water from the neck down.  8.  DO NOT shower/wash with your normal soap after using and rinsing off  the CHG Soap.                9.  Pat yourself dry with a clean towel.            10.  Wear clean pajamas.            11.  Place clean sheets on your bed the night of your first shower and do not  sleep with pets. Day of Surgery : Do not apply any lotions/deodorants the morning of surgery.  Please wear clean clothes to the hospital/surgery center.  FAILURE TO FOLLOW THESE INSTRUCTIONS MAY RESULT IN THE CANCELLATION OF YOUR SURGERY PATIENT SIGNATURE_________________________________  NURSE SIGNATURE__________________________________  ________________________________________________________________________  WHAT IS A BLOOD TRANSFUSION? Blood Transfusion Information  A transfusion is the replacement of blood or some of its parts. Blood is made up of multiple cells which provide different functions.  Red blood cells carry oxygen and are used for blood loss replacement.  White blood cells fight against infection.  Platelets control bleeding.  Plasma helps clot blood.  Other blood products are available for specialized needs, such as hemophilia or other clotting disorders. BEFORE THE TRANSFUSION  Who gives blood for transfusions?   Healthy volunteers who are fully evaluated to make sure their blood is safe. This is blood bank blood. Transfusion therapy is the safest it has ever been in the practice of medicine. Before blood is taken from a donor, a complete history is taken to make  sure that person has no history of diseases nor engages in risky social behavior (examples are intravenous drug use or sexual activity with multiple partners). The donor's travel history is screened to minimize risk of transmitting infections, such as malaria. The donated blood is tested for signs of infectious diseases, such as HIV and hepatitis. The blood is then tested to be sure it is compatible with you in order to minimize the chance of a transfusion reaction. If you or a relative donates blood, this is often done in anticipation of surgery and is not appropriate for emergency situations. It takes many days to process the donated blood. RISKS AND COMPLICATIONS Although transfusion therapy is very safe and saves many lives, the main dangers of transfusion include:   Getting an infectious disease.  Developing a transfusion reaction.  This is an allergic reaction to something in the blood you were given. Every precaution is taken to prevent this. The decision to have a blood transfusion has been considered carefully by your caregiver before blood is given. Blood is not given unless the benefits outweigh the risks. AFTER THE TRANSFUSION  Right after receiving a blood transfusion, you will usually feel much better and more energetic. This is especially true if your red blood cells have gotten low (anemic). The transfusion raises the level of the red blood cells which carry oxygen, and this usually causes an energy increase.  The nurse administering the transfusion will monitor you carefully for complications. HOME CARE INSTRUCTIONS  No special instructions are needed after a transfusion. You may find your energy is better. Speak with your caregiver about any limitations on activity for underlying diseases you may have. SEEK MEDICAL CARE IF:   Your condition is not improving after your transfusion.  You develop redness or irritation at the intravenous (IV) site. SEEK IMMEDIATE MEDICAL CARE IF:   Any of the following symptoms occur over the next 12 hours:  Shaking chills.  You have a temperature by mouth above 102 F (38.9 C), not controlled by medicine.  Chest, back, or muscle pain.  People around you feel you are not acting correctly or are confused.  Shortness of breath or difficulty breathing.  Dizziness and fainting.  You get a rash or develop hives.  You have a decrease in urine output.  Your urine turns a dark color or changes to pink, red, or brown. Any of the following symptoms occur over the next 10 days:  You have a temperature by mouth above 102 F (38.9 C), not controlled by medicine.  Shortness of breath.  Weakness after normal activity.  The white part of the eye turns yellow (jaundice).  You have a decrease in the amount of urine or are urinating less often.  Your urine turns a dark color or changes to pink, red, or brown. Document Released: 08/11/2000 Document Revised: 11/06/2011 Document Reviewed: 03/30/2008 [facility] Patient Information 2014 [place], [place].  _______________________________________________________________________

## 2016-10-13 NOTE — Progress Notes (Signed)
Cardiac clearance note dr Martinique 08-22-17 epic Chest ct 10-02-16 epic ekg 08-22-16 epic

## 2016-10-16 DIAGNOSIS — D6489 Other specified anemias: Secondary | ICD-10-CM | POA: Diagnosis not present

## 2016-10-16 DIAGNOSIS — H401123 Primary open-angle glaucoma, left eye, severe stage: Secondary | ICD-10-CM | POA: Diagnosis not present

## 2016-10-16 DIAGNOSIS — H401111 Primary open-angle glaucoma, right eye, mild stage: Secondary | ICD-10-CM | POA: Diagnosis not present

## 2016-10-17 ENCOUNTER — Encounter (INDEPENDENT_AMBULATORY_CARE_PROVIDER_SITE_OTHER): Payer: Self-pay

## 2016-10-17 ENCOUNTER — Encounter (HOSPITAL_COMMUNITY): Payer: Self-pay

## 2016-10-17 ENCOUNTER — Encounter (HOSPITAL_COMMUNITY)
Admission: RE | Admit: 2016-10-17 | Discharge: 2016-10-17 | Disposition: A | Discharge status: Home / Self Care | Payer: Medicare Other | Source: Ambulatory Visit | Attending: Surgery | Admitting: Surgery

## 2016-10-17 DIAGNOSIS — I4891 Unspecified atrial fibrillation: Secondary | ICD-10-CM | POA: Insufficient documentation

## 2016-10-17 DIAGNOSIS — M5136 Other intervertebral disc degeneration, lumbar region: Secondary | ICD-10-CM | POA: Insufficient documentation

## 2016-10-17 DIAGNOSIS — Z951 Presence of aortocoronary bypass graft: Secondary | ICD-10-CM | POA: Diagnosis not present

## 2016-10-17 DIAGNOSIS — E785 Hyperlipidemia, unspecified: Secondary | ICD-10-CM | POA: Diagnosis not present

## 2016-10-17 DIAGNOSIS — K219 Gastro-esophageal reflux disease without esophagitis: Secondary | ICD-10-CM | POA: Insufficient documentation

## 2016-10-17 DIAGNOSIS — Z952 Presence of prosthetic heart valve: Secondary | ICD-10-CM | POA: Diagnosis not present

## 2016-10-17 DIAGNOSIS — Z8546 Personal history of malignant neoplasm of prostate: Secondary | ICD-10-CM | POA: Insufficient documentation

## 2016-10-17 DIAGNOSIS — Z01812 Encounter for preprocedural laboratory examination: Secondary | ICD-10-CM | POA: Insufficient documentation

## 2016-10-17 HISTORY — DX: Personal history of other medical treatment: Z92.89

## 2016-10-17 LAB — CBC
HEMATOCRIT: 27.3 % — AB (ref 39.0–52.0)
HEMOGLOBIN: 8.5 g/dL — AB (ref 13.0–17.0)
MCH: 24.9 pg — ABNORMAL LOW (ref 26.0–34.0)
MCHC: 31.1 g/dL (ref 30.0–36.0)
MCV: 80.1 fL (ref 78.0–100.0)
Platelets: 263 10*3/uL (ref 150–400)
RBC: 3.41 MIL/uL — ABNORMAL LOW (ref 4.22–5.81)
RDW: 18.6 % — AB (ref 11.5–15.5)
WBC: 6 10*3/uL (ref 4.0–10.5)

## 2016-10-17 LAB — BASIC METABOLIC PANEL
ANION GAP: 4 — AB (ref 5–15)
BUN: 17 mg/dL (ref 6–20)
CO2: 27 mmol/L (ref 22–32)
Calcium: 9 mg/dL (ref 8.9–10.3)
Chloride: 108 mmol/L (ref 101–111)
Creatinine, Ser: 1.03 mg/dL (ref 0.61–1.24)
GFR calc Af Amer: >60 mL/min (ref >60)
GLUCOSE: 127 mg/dL — AB (ref 65–99)
POTASSIUM: 4.3 mmol/L (ref 3.5–5.1)
Sodium: 139 mmol/L (ref 135–145)

## 2016-10-18 DIAGNOSIS — R351 Nocturia: Secondary | ICD-10-CM | POA: Diagnosis not present

## 2016-10-18 DIAGNOSIS — C61 Malignant neoplasm of prostate: Secondary | ICD-10-CM | POA: Diagnosis not present

## 2016-10-18 LAB — CEA: CEA: 4.6 ng/mL (ref 0.0–4.7)

## 2016-10-18 LAB — HEMOGLOBIN A1C
Hgb A1c MFr Bld: 5.5 % (ref 4.8–5.6)
Mean Plasma Glucose: 111

## 2016-10-18 NOTE — Progress Notes (Signed)
  Cbc results routed to dr gross inbasket by epic

## 2016-10-23 MED ORDER — CLINDAMYCIN PHOSPHATE 900 MG/50ML IV SOLN: 900.0000 mg | INTRAVENOUS | Status: DC

## 2016-10-23 MED ORDER — DEXTROSE 5 % IV SOLN
5.0000 mg/kg | INTRAVENOUS | Status: DC
  Filled 2016-10-23: qty 10

## 2016-10-23 NOTE — Anesthesia Preprocedure Evaluation (Signed)
Anesthesia Evaluation  Patient identified by MRN, date of birth, ID band Patient awake    Reviewed: Allergy & Precautions, NPO status   Airway Mallampati: II  TM Distance: >3 FB Neck ROM: Full    Dental no notable dental hx.    Pulmonary sleep apnea , COPD, former smoker,    Pulmonary exam normal breath sounds clear to auscultation       Cardiovascular + CAD and + CABG  Normal cardiovascular exam+ dysrhythmias Atrial Fibrillation + Valvular Problems/Murmurs AS  Rhythm:Regular Rate:Normal     Neuro/Psych Anxiety    GI/Hepatic GERD  ,  Endo/Other    Renal/GU      Musculoskeletal   Abdominal   Peds  Hematology  (+) anemia ,   Anesthesia Other Findings   Reproductive/Obstetrics                             Anesthesia Physical  Anesthesia Plan  ASA: III  Anesthesia Plan: General   Post-op Pain Management:    Induction: Intravenous  Airway Management Planned: Oral ETT  Additional Equipment:   Intra-op Plan:   Post-operative Plan: Extubation in OR  Informed Consent: I have reviewed the patients History and Physical, chart, labs and discussed the procedure including the risks, benefits and alternatives for the proposed anesthesia with the patient or authorized representative who has indicated his/her understanding and acceptance.   Dental advisory given  Plan Discussed with: CRNA  Anesthesia Plan Comments:         Anesthesia Quick Evaluation

## 2016-10-24 ENCOUNTER — Inpatient Hospital Stay (HOSPITAL_COMMUNITY): Payer: Medicare Other | Admitting: Anesthesiology

## 2016-10-24 ENCOUNTER — Encounter (HOSPITAL_COMMUNITY)
Admission: RE | Disposition: A | Discharge status: Home / Self Care | Payer: Self-pay | Source: Ambulatory Visit | Attending: Surgery

## 2016-10-24 ENCOUNTER — Inpatient Hospital Stay (HOSPITAL_COMMUNITY)
Admission: RE | Admit: 2016-10-24 | Discharge: 2016-10-27 | DRG: 330 | Disposition: A | Discharge status: Home / Self Care | Payer: Medicare Other | Source: Ambulatory Visit | Attending: Surgery | Admitting: Surgery

## 2016-10-24 ENCOUNTER — Encounter (HOSPITAL_COMMUNITY): Payer: Self-pay | Admitting: Certified Registered Nurse Anesthetist

## 2016-10-24 DIAGNOSIS — I2510 Atherosclerotic heart disease of native coronary artery without angina pectoris: Secondary | ICD-10-CM | POA: Diagnosis not present

## 2016-10-24 DIAGNOSIS — D62 Acute posthemorrhagic anemia: Secondary | ICD-10-CM | POA: Diagnosis not present

## 2016-10-24 DIAGNOSIS — D500 Iron deficiency anemia secondary to blood loss (chronic): Secondary | ICD-10-CM | POA: Diagnosis present

## 2016-10-24 DIAGNOSIS — K5730 Diverticulosis of large intestine without perforation or abscess without bleeding: Secondary | ICD-10-CM | POA: Diagnosis not present

## 2016-10-24 DIAGNOSIS — I4891 Unspecified atrial fibrillation: Secondary | ICD-10-CM

## 2016-10-24 DIAGNOSIS — N390 Urinary tract infection, site not specified: Secondary | ICD-10-CM

## 2016-10-24 DIAGNOSIS — Z8249 Family history of ischemic heart disease and other diseases of the circulatory system: Secondary | ICD-10-CM

## 2016-10-24 DIAGNOSIS — F411 Generalized anxiety disorder: Secondary | ICD-10-CM | POA: Diagnosis present

## 2016-10-24 DIAGNOSIS — K567 Ileus, unspecified: Secondary | ICD-10-CM | POA: Diagnosis not present

## 2016-10-24 DIAGNOSIS — E785 Hyperlipidemia, unspecified: Secondary | ICD-10-CM | POA: Diagnosis not present

## 2016-10-24 DIAGNOSIS — Z7901 Long term (current) use of anticoagulants: Secondary | ICD-10-CM

## 2016-10-24 DIAGNOSIS — G4733 Obstructive sleep apnea (adult) (pediatric): Secondary | ICD-10-CM | POA: Diagnosis present

## 2016-10-24 DIAGNOSIS — Z87891 Personal history of nicotine dependence: Secondary | ICD-10-CM

## 2016-10-24 DIAGNOSIS — R011 Cardiac murmur, unspecified: Secondary | ICD-10-CM | POA: Diagnosis not present

## 2016-10-24 DIAGNOSIS — Z8546 Personal history of malignant neoplasm of prostate: Secondary | ICD-10-CM

## 2016-10-24 DIAGNOSIS — H3530 Unspecified macular degeneration: Secondary | ICD-10-CM | POA: Diagnosis present

## 2016-10-24 DIAGNOSIS — Z951 Presence of aortocoronary bypass graft: Secondary | ICD-10-CM

## 2016-10-24 DIAGNOSIS — Z952 Presence of prosthetic heart valve: Secondary | ICD-10-CM | POA: Diagnosis not present

## 2016-10-24 DIAGNOSIS — H9190 Unspecified hearing loss, unspecified ear: Secondary | ICD-10-CM | POA: Diagnosis present

## 2016-10-24 DIAGNOSIS — C180 Malignant neoplasm of cecum: Secondary | ICD-10-CM | POA: Diagnosis present

## 2016-10-24 DIAGNOSIS — K922 Gastrointestinal hemorrhage, unspecified: Secondary | ICD-10-CM | POA: Diagnosis not present

## 2016-10-24 DIAGNOSIS — I10 Essential (primary) hypertension: Secondary | ICD-10-CM | POA: Diagnosis present

## 2016-10-24 DIAGNOSIS — I482 Chronic atrial fibrillation: Secondary | ICD-10-CM | POA: Diagnosis not present

## 2016-10-24 DIAGNOSIS — I25810 Atherosclerosis of coronary artery bypass graft(s) without angina pectoris: Secondary | ICD-10-CM | POA: Diagnosis not present

## 2016-10-24 DIAGNOSIS — H409 Unspecified glaucoma: Secondary | ICD-10-CM | POA: Diagnosis present

## 2016-10-24 DIAGNOSIS — K219 Gastro-esophageal reflux disease without esophagitis: Secondary | ICD-10-CM | POA: Diagnosis present

## 2016-10-24 DIAGNOSIS — C182 Malignant neoplasm of ascending colon: Secondary | ICD-10-CM | POA: Diagnosis not present

## 2016-10-24 DIAGNOSIS — N200 Calculus of kidney: Secondary | ICD-10-CM | POA: Diagnosis present

## 2016-10-24 DIAGNOSIS — E7800 Pure hypercholesterolemia, unspecified: Secondary | ICD-10-CM | POA: Diagnosis present

## 2016-10-24 DIAGNOSIS — J449 Chronic obstructive pulmonary disease, unspecified: Secondary | ICD-10-CM | POA: Diagnosis present

## 2016-10-24 DIAGNOSIS — I493 Ventricular premature depolarization: Secondary | ICD-10-CM | POA: Diagnosis not present

## 2016-10-24 DIAGNOSIS — K921 Melena: Secondary | ICD-10-CM | POA: Diagnosis not present

## 2016-10-24 LAB — PROTIME-INR
INR: 1.19
Prothrombin Time: 15.1 seconds (ref 11.4–15.2)

## 2016-10-24 LAB — HEMOGLOBIN: Hemoglobin: 8.4 g/dL — ABNORMAL LOW (ref 13.0–17.0)

## 2016-10-24 LAB — ABO/RH: ABO/RH(D): A NEG

## 2016-10-24 LAB — TYPE AND SCREEN
ABO/RH(D): A NEG
ANTIBODY SCREEN: NEGATIVE

## 2016-10-24 SURGERY — COLECTOMY, PARTIAL, ROBOT-ASSISTED, LAPAROSCOPIC
Anesthesia: General | Site: Abdomen

## 2016-10-24 MED ORDER — ACETAMINOPHEN 500 MG PO TABS
1000.0000 mg | ORAL_TABLET | ORAL | Status: AC
  Administered 2016-10-24: 1000 mg via ORAL
  Filled 2016-10-24: qty 2

## 2016-10-24 MED ORDER — ONDANSETRON HCL 4 MG/2ML IJ SOLN
INTRAMUSCULAR | Status: DC | PRN
  Administered 2016-10-24: 4 mg via INTRAVENOUS

## 2016-10-24 MED ORDER — LACTATED RINGERS IV SOLN
INTRAVENOUS | Status: DC
  Administered 2016-10-24: 16:00:00 via INTRAVENOUS

## 2016-10-24 MED ORDER — SUCCINYLCHOLINE CHLORIDE 200 MG/10ML IV SOSY
PREFILLED_SYRINGE | INTRAVENOUS | Status: DC | PRN
  Administered 2016-10-24: 100 mg via INTRAVENOUS

## 2016-10-24 MED ORDER — SUCCINYLCHOLINE CHLORIDE 200 MG/10ML IV SOSY
PREFILLED_SYRINGE | INTRAVENOUS | Status: AC
  Filled 2016-10-24: qty 10

## 2016-10-24 MED ORDER — PHENOL 1.4 % MT LIQD
2.0000 | OROMUCOSAL | Status: DC | PRN
Start: 2016-10-24 — End: 2016-10-27

## 2016-10-24 MED ORDER — EPHEDRINE 5 MG/ML INJ
INTRAVENOUS | Status: AC
  Filled 2016-10-24: qty 10

## 2016-10-24 MED ORDER — LACTATED RINGERS IV BOLUS (SEPSIS)
1000.0000 mL | Freq: Three times a day (TID) | INTRAVENOUS | Status: AC | PRN
  Administered 2016-10-26: 1000 mL via INTRAVENOUS
  Filled 2016-10-24: qty 1000

## 2016-10-24 MED ORDER — ACETAMINOPHEN 325 MG PO TABS: 650.0000 mg | ORAL_TABLET | Freq: Once | ORAL | Status: DC

## 2016-10-24 MED ORDER — METHOCARBAMOL 500 MG PO TABS: 1000.0000 mg | ORAL_TABLET | Freq: Four times a day (QID) | ORAL | Status: DC | PRN

## 2016-10-24 MED ORDER — TRAMADOL HCL 50 MG PO TABS
50.0000 mg | ORAL_TABLET | Freq: Four times a day (QID) | ORAL | Status: DC | PRN
Start: 2016-10-25 — End: 2016-10-27

## 2016-10-24 MED ORDER — ONDANSETRON HCL 4 MG/2ML IJ SOLN
INTRAMUSCULAR | Status: AC
Start: 2016-10-24 — End: 2016-10-24
  Filled 2016-10-24: qty 2

## 2016-10-24 MED ORDER — FENTANYL CITRATE (PF) 100 MCG/2ML IJ SOLN
INTRAMUSCULAR | Status: DC | PRN
  Administered 2016-10-24 (×8): 25 ug via INTRAVENOUS

## 2016-10-24 MED ORDER — LACTATED RINGERS IR SOLN
Status: DC | PRN
  Administered 2016-10-24: 1000 mL

## 2016-10-24 MED ORDER — PROPOFOL 10 MG/ML IV BOLUS
INTRAVENOUS | Status: DC | PRN
  Administered 2016-10-24: 150 mg via INTRAVENOUS

## 2016-10-24 MED ORDER — ALVIMOPAN 12 MG PO CAPS
12.0000 mg | ORAL_CAPSULE | Freq: Once | ORAL | Status: AC
  Administered 2016-10-24: 12 mg via ORAL
  Filled 2016-10-24: qty 1

## 2016-10-24 MED ORDER — GABAPENTIN 300 MG PO CAPS
300.0000 mg | ORAL_CAPSULE | ORAL | Status: AC
  Administered 2016-10-24: 300 mg via ORAL
  Filled 2016-10-24: qty 1

## 2016-10-24 MED ORDER — 0.9 % SODIUM CHLORIDE (POUR BTL) OPTIME
TOPICAL | Status: DC | PRN
  Administered 2016-10-24: 2000 mL

## 2016-10-24 MED ORDER — VITAMIN B-12 1000 MCG PO TABS
1000.0000 ug | ORAL_TABLET | Freq: Every day | ORAL | Status: DC
  Administered 2016-10-25 – 2016-10-27 (×3): 1000 ug via ORAL
  Filled 2016-10-24 (×3): qty 1

## 2016-10-24 MED ORDER — CLINDAMYCIN PHOSPHATE 900 MG/50ML IV SOLN
INTRAVENOUS | Status: AC
  Filled 2016-10-24: qty 50

## 2016-10-24 MED ORDER — PERPHENAZINE-AMITRIPTYLINE 4-25 MG PO TABS: 1.0000 | ORAL_TABLET | Freq: Every day | ORAL | Status: DC

## 2016-10-24 MED ORDER — CALCIUM CARBONATE-VITAMIN D 500-200 MG-UNIT PO TABS
1.0000 | ORAL_TABLET | Freq: Every day | ORAL | Status: DC
  Administered 2016-10-25 – 2016-10-27 (×3): 1 via ORAL
  Filled 2016-10-24 (×2): qty 1
  Filled 2016-10-24: qty 2
  Filled 2016-10-24 (×3): qty 1

## 2016-10-24 MED ORDER — ALUM & MAG HYDROXIDE-SIMETH 200-200-20 MG/5ML PO SUSP
30.0000 mL | Freq: Four times a day (QID) | ORAL | Status: DC | PRN
  Administered 2016-10-25 – 2016-10-26 (×2): 30 mL via ORAL
  Filled 2016-10-24 (×2): qty 30

## 2016-10-24 MED ORDER — CEFOTETAN DISODIUM-DEXTROSE 2-2.08 GM-% IV SOLR
INTRAVENOUS | Status: AC
  Filled 2016-10-24: qty 50

## 2016-10-24 MED ORDER — METHOCARBAMOL 1000 MG/10ML IJ SOLN
1000.0000 mg | Freq: Four times a day (QID) | INTRAVENOUS | Status: DC | PRN
  Filled 2016-10-24: qty 10

## 2016-10-24 MED ORDER — ROCURONIUM BROMIDE 50 MG/5ML IV SOSY
PREFILLED_SYRINGE | INTRAVENOUS | Status: AC
  Filled 2016-10-24: qty 5

## 2016-10-24 MED ORDER — FLUTICASONE PROPIONATE 50 MCG/ACT NA SUSP
1.0000 | Freq: Every day | NASAL | Status: DC | PRN
  Administered 2016-10-26: 1 via NASAL
  Filled 2016-10-24: qty 16

## 2016-10-24 MED ORDER — ROSUVASTATIN CALCIUM 5 MG PO TABS
5.0000 mg | ORAL_TABLET | Freq: Every day | ORAL | Status: DC
  Administered 2016-10-24 – 2016-10-26 (×3): 5 mg via ORAL
  Filled 2016-10-24 (×3): qty 1

## 2016-10-24 MED ORDER — MAGIC MOUTHWASH
15.0000 mL | Freq: Four times a day (QID) | ORAL | Status: DC | PRN
  Filled 2016-10-24: qty 15

## 2016-10-24 MED ORDER — LIDOCAINE 2% (20 MG/ML) 5 ML SYRINGE
INTRAMUSCULAR | Status: AC
  Filled 2016-10-24: qty 5

## 2016-10-24 MED ORDER — ROCURONIUM BROMIDE 50 MG/5ML IV SOSY
PREFILLED_SYRINGE | INTRAVENOUS | Status: DC | PRN
  Administered 2016-10-24: 50 mg via INTRAVENOUS
  Administered 2016-10-24 (×2): 10 mg via INTRAVENOUS

## 2016-10-24 MED ORDER — ONDANSETRON HCL 4 MG/2ML IJ SOLN: 4.0000 mg | Freq: Four times a day (QID) | INTRAMUSCULAR | Status: DC | PRN

## 2016-10-24 MED ORDER — LACTATED RINGERS IV SOLN: INTRAVENOUS | Status: DC

## 2016-10-24 MED ORDER — ENOXAPARIN SODIUM 40 MG/0.4ML ~~LOC~~ SOLN: 40.0000 mg | SUBCUTANEOUS | Status: DC

## 2016-10-24 MED ORDER — PERPHENAZINE 4 MG PO TABS
4.0000 mg | ORAL_TABLET | Freq: Every day | ORAL | Status: DC
  Administered 2016-10-24 – 2016-10-26 (×3): 4 mg via ORAL
  Filled 2016-10-24 (×3): qty 1

## 2016-10-24 MED ORDER — LACTATED RINGERS IV SOLN
INTRAVENOUS | Status: DC | PRN
  Administered 2016-10-24 (×3): via INTRAVENOUS

## 2016-10-24 MED ORDER — METOPROLOL TARTRATE 5 MG/5ML IV SOLN: 5.0000 mg | Freq: Four times a day (QID) | INTRAVENOUS | Status: DC | PRN

## 2016-10-24 MED ORDER — MEPERIDINE HCL 50 MG/ML IJ SOLN: 6.2500 mg | INTRAMUSCULAR | Status: DC | PRN

## 2016-10-24 MED ORDER — SODIUM CHLORIDE 0.9% FLUSH: 3.0000 mL | INTRAVENOUS | Status: DC | PRN

## 2016-10-24 MED ORDER — BISACODYL 5 MG PO TBEC
20.0000 mg | DELAYED_RELEASE_TABLET | Freq: Once | ORAL | Status: DC
  Filled 2016-10-24: qty 4

## 2016-10-24 MED ORDER — DEXTROSE 5 % IV SOLN
2.0000 g | INTRAVENOUS | Status: AC
  Administered 2016-10-24: 2 g via INTRAVENOUS
  Filled 2016-10-24: qty 2

## 2016-10-24 MED ORDER — PHENYLEPHRINE HCL 10 MG/ML IJ SOLN
INTRAMUSCULAR | Status: AC
  Filled 2016-10-24: qty 1

## 2016-10-24 MED ORDER — BUPIVACAINE LIPOSOME 1.3 % IJ SUSP
20.0000 mL | INTRAMUSCULAR | Status: DC
  Filled 2016-10-24: qty 20

## 2016-10-24 MED ORDER — DORZOLAMIDE HCL 2 % OP SOLN
1.0000 [drp] | Freq: Three times a day (TID) | OPHTHALMIC | Status: DC
  Administered 2016-10-24 – 2016-10-27 (×9): 1 [drp] via OPHTHALMIC
  Filled 2016-10-24: qty 10

## 2016-10-24 MED ORDER — MENTHOL 3 MG MT LOZG
1.0000 | LOZENGE | OROMUCOSAL | Status: DC | PRN
  Filled 2016-10-24: qty 9

## 2016-10-24 MED ORDER — ALVIMOPAN 12 MG PO CAPS
12.0000 mg | ORAL_CAPSULE | Freq: Two times a day (BID) | ORAL | Status: DC
  Administered 2016-10-25: 12 mg via ORAL
  Filled 2016-10-24 (×3): qty 1

## 2016-10-24 MED ORDER — TRAMADOL HCL 50 MG PO TABS: 50.0000 mg | ORAL_TABLET | Freq: Four times a day (QID) | ORAL | 0 refills | Status: DC | PRN

## 2016-10-24 MED ORDER — DEXAMETHASONE SODIUM PHOSPHATE 10 MG/ML IJ SOLN
INTRAMUSCULAR | Status: DC | PRN
  Administered 2016-10-24: 8 mg via INTRAVENOUS

## 2016-10-24 MED ORDER — PROCHLORPERAZINE EDISYLATE 5 MG/ML IJ SOLN: 5.0000 mg | INTRAMUSCULAR | Status: DC | PRN

## 2016-10-24 MED ORDER — SODIUM CHLORIDE 0.9% FLUSH
3.0000 mL | Freq: Two times a day (BID) | INTRAVENOUS | Status: DC
  Administered 2016-10-24 – 2016-10-26 (×4): 3 mL via INTRAVENOUS

## 2016-10-24 MED ORDER — LATANOPROST 0.005 % OP SOLN
1.0000 [drp] | Freq: Every day | OPHTHALMIC | Status: DC
  Administered 2016-10-24 – 2016-10-26 (×3): 1 [drp] via OPHTHALMIC
  Filled 2016-10-24: qty 2.5

## 2016-10-24 MED ORDER — PROMETHAZINE HCL 25 MG/ML IJ SOLN: 6.2500 mg | INTRAMUSCULAR | Status: DC | PRN

## 2016-10-24 MED ORDER — FENTANYL CITRATE (PF) 100 MCG/2ML IJ SOLN: 12.5000 ug | INTRAMUSCULAR | Status: DC | PRN

## 2016-10-24 MED ORDER — PREDNISOLONE ACETATE 1 % OP SUSP
1.0000 [drp] | Freq: Every day | OPHTHALMIC | Status: DC
  Filled 2016-10-24: qty 1

## 2016-10-24 MED ORDER — SODIUM CHLORIDE 0.9 % IJ SOLN
INTRAMUSCULAR | Status: DC | PRN
  Administered 2016-10-24: 28 mL via INTRAVENOUS

## 2016-10-24 MED ORDER — AMITRIPTYLINE HCL 25 MG PO TABS
25.0000 mg | ORAL_TABLET | Freq: Every day | ORAL | Status: DC
  Administered 2016-10-24 – 2016-10-26 (×3): 25 mg via ORAL
  Filled 2016-10-24 (×3): qty 1

## 2016-10-24 MED ORDER — FENTANYL CITRATE (PF) 100 MCG/2ML IJ SOLN: 25.0000 ug | INTRAMUSCULAR | Status: DC | PRN

## 2016-10-24 MED ORDER — SUGAMMADEX SODIUM 200 MG/2ML IV SOLN
INTRAVENOUS | Status: DC | PRN
  Administered 2016-10-24: 200 mg via INTRAVENOUS

## 2016-10-24 MED ORDER — LIDOCAINE 2% (20 MG/ML) 5 ML SYRINGE
INTRAMUSCULAR | Status: DC | PRN
  Administered 2016-10-24: 1.5 mg/kg/h via INTRAVENOUS

## 2016-10-24 MED ORDER — ALBUTEROL SULFATE (2.5 MG/3ML) 0.083% IN NEBU: 2.5000 mg | INHALATION_SOLUTION | Freq: Four times a day (QID) | RESPIRATORY_TRACT | Status: DC | PRN

## 2016-10-24 MED ORDER — LIP MEDEX EX OINT
1.0000 "application " | TOPICAL_OINTMENT | Freq: Two times a day (BID) | CUTANEOUS | Status: DC
  Administered 2016-10-24 – 2016-10-27 (×7): 1 via TOPICAL
  Filled 2016-10-24: qty 7

## 2016-10-24 MED ORDER — GLUCOSAMINE-CHONDROITIN 500-400 MG PO TABS: 1.0000 | ORAL_TABLET | Freq: Two times a day (BID) | ORAL | Status: DC

## 2016-10-24 MED ORDER — ADULT MULTIVITAMIN W/MINERALS CH
1.0000 | ORAL_TABLET | Freq: Every day | ORAL | Status: DC
  Administered 2016-10-25 – 2016-10-27 (×3): 1 via ORAL
  Filled 2016-10-24 (×3): qty 1

## 2016-10-24 MED ORDER — GENTAMICIN SULFATE 40 MG/ML IJ SOLN
5.0000 mg/kg | Freq: Once | INTRAVENOUS | Status: AC
  Administered 2016-10-24: 400 mg via INTRAVENOUS
  Filled 2016-10-24 (×2): qty 10

## 2016-10-24 MED ORDER — FENTANYL CITRATE (PF) 250 MCG/5ML IJ SOLN
INTRAMUSCULAR | Status: AC
  Filled 2016-10-24: qty 5

## 2016-10-24 MED ORDER — SODIUM CHLORIDE 0.9 % IJ SOLN
INTRAMUSCULAR | Status: AC
  Filled 2016-10-24: qty 50

## 2016-10-24 MED ORDER — ONDANSETRON HCL 4 MG PO TABS: 4.0000 mg | ORAL_TABLET | Freq: Four times a day (QID) | ORAL | Status: DC | PRN

## 2016-10-24 MED ORDER — DIPHENHYDRAMINE HCL 50 MG/ML IJ SOLN: 12.5000 mg | Freq: Four times a day (QID) | INTRAMUSCULAR | Status: DC | PRN

## 2016-10-24 MED ORDER — DIPHENHYDRAMINE HCL 12.5 MG/5ML PO ELIX: 12.5000 mg | ORAL_SOLUTION | Freq: Four times a day (QID) | ORAL | Status: DC | PRN

## 2016-10-24 MED ORDER — BUPIVACAINE HCL (PF) 0.25 % IJ SOLN
INTRAMUSCULAR | Status: AC
  Filled 2016-10-24: qty 60

## 2016-10-24 MED ORDER — PREDNISOLONE ACETATE 1 % OP SUSP
1.0000 [drp] | Freq: Every day | OPHTHALMIC | Status: DC
  Administered 2016-10-24 – 2016-10-26 (×3): 1 [drp] via OPHTHALMIC
  Filled 2016-10-24: qty 1

## 2016-10-24 MED ORDER — ENOXAPARIN SODIUM 40 MG/0.4ML ~~LOC~~ SOLN
40.0000 mg | Freq: Once | SUBCUTANEOUS | Status: AC
  Administered 2016-10-24: 40 mg via SUBCUTANEOUS
  Filled 2016-10-24: qty 0.4

## 2016-10-24 MED ORDER — ZOLPIDEM TARTRATE 5 MG PO TABS
5.0000 mg | ORAL_TABLET | Freq: Every evening | ORAL | Status: DC | PRN
  Administered 2016-10-25 – 2016-10-26 (×2): 5 mg via ORAL
  Filled 2016-10-24 (×2): qty 1

## 2016-10-24 MED ORDER — SACCHAROMYCES BOULARDII 250 MG PO CAPS
250.0000 mg | ORAL_CAPSULE | Freq: Two times a day (BID) | ORAL | Status: DC
  Administered 2016-10-24 – 2016-10-27 (×6): 250 mg via ORAL
  Filled 2016-10-24 (×6): qty 1

## 2016-10-24 MED ORDER — BUPIVACAINE LIPOSOME 1.3 % IJ SUSP
INTRAMUSCULAR | Status: DC | PRN
  Administered 2016-10-24: 20 mL

## 2016-10-24 MED ORDER — VITAMIN D3 25 MCG (1000 UNIT) PO TABS
1000.0000 [IU] | ORAL_TABLET | Freq: Every day | ORAL | Status: DC
  Administered 2016-10-25 – 2016-10-27 (×3): 1000 [IU] via ORAL
  Filled 2016-10-24 (×6): qty 1

## 2016-10-24 MED ORDER — SUGAMMADEX SODIUM 200 MG/2ML IV SOLN
INTRAVENOUS | Status: AC
  Filled 2016-10-24: qty 2

## 2016-10-24 MED ORDER — EPHEDRINE SULFATE 50 MG/ML IJ SOLN
INTRAMUSCULAR | Status: DC | PRN
  Administered 2016-10-24: 5 mg via INTRAVENOUS
  Administered 2016-10-24: 10 mg via INTRAVENOUS
  Administered 2016-10-24: 5 mg via INTRAVENOUS
  Administered 2016-10-24 (×2): 10 mg via INTRAVENOUS
  Administered 2016-10-24: 5 mg via INTRAVENOUS
  Administered 2016-10-24 (×2): 10 mg via INTRAVENOUS
  Administered 2016-10-24 (×3): 5 mg via INTRAVENOUS
  Administered 2016-10-24: 10 mg via INTRAVENOUS
  Administered 2016-10-24 (×2): 5 mg via INTRAVENOUS
  Administered 2016-10-24: 10 mg via INTRAVENOUS
  Administered 2016-10-24: 5 mg via INTRAVENOUS

## 2016-10-24 MED ORDER — FAMOTIDINE 20 MG PO TABS
20.0000 mg | ORAL_TABLET | Freq: Every day | ORAL | Status: DC
  Administered 2016-10-24 – 2016-10-27 (×4): 20 mg via ORAL
  Filled 2016-10-24 (×4): qty 1

## 2016-10-24 MED ORDER — GENTAMICIN SULFATE 40 MG/ML IJ SOLN
INTRAMUSCULAR | Status: DC
  Filled 2016-10-24: qty 6

## 2016-10-24 MED ORDER — DEXAMETHASONE SODIUM PHOSPHATE 10 MG/ML IJ SOLN
INTRAMUSCULAR | Status: AC
  Filled 2016-10-24: qty 1

## 2016-10-24 MED ORDER — SODIUM CHLORIDE 0.9 % IV SOLN: 250.0000 mL | INTRAVENOUS | Status: DC | PRN

## 2016-10-24 MED ORDER — SULFAMETHOXAZOLE-TRIMETHOPRIM 800-160 MG PO TABS
2.0000 | ORAL_TABLET | Freq: Two times a day (BID) | ORAL | Status: DC
  Administered 2016-10-25 – 2016-10-27 (×5): 2 via ORAL
  Filled 2016-10-24 (×5): qty 2
  Filled 2016-10-24: qty 1

## 2016-10-24 MED ORDER — PROPOFOL 10 MG/ML IV BOLUS
INTRAVENOUS | Status: AC
  Filled 2016-10-24: qty 20

## 2016-10-24 MED ORDER — SODIUM CHLORIDE 0.9 % IV SOLN
INTRAVENOUS | Status: DC | PRN
  Administered 2016-10-24: 09:00:00 via INTRAPERITONEAL

## 2016-10-24 MED ORDER — DEXTROSE 5 % IV SOLN
2.0000 g | Freq: Two times a day (BID) | INTRAVENOUS | Status: AC
  Administered 2016-10-24: 2 g via INTRAVENOUS
  Filled 2016-10-24: qty 2

## 2016-10-24 MED ORDER — ACETAMINOPHEN 500 MG PO TABS
1000.0000 mg | ORAL_TABLET | Freq: Three times a day (TID) | ORAL | Status: DC
  Administered 2016-10-24 – 2016-10-27 (×9): 1000 mg via ORAL
  Filled 2016-10-24 (×9): qty 2

## 2016-10-24 SURGICAL SUPPLY — 104 items
APPLIER CLIP 5 13 M/L LIGAMAX5 (MISCELLANEOUS)
APPLIER CLIP ROT 10 11.4 M/L (STAPLE)
APR CLP MED LRG 11.4X10 (STAPLE)
APR CLP MED LRG 5 ANG JAW (MISCELLANEOUS)
BLADE EXTENDED COATED 6.5IN (ELECTRODE) IMPLANT
CANNULA REDUC XI 12-8 STAPL (CANNULA) ×1
CANNULA REDUCER 12-8 DVNC XI (CANNULA) ×1 IMPLANT
CELLS DAT CNTRL 66122 CELL SVR (MISCELLANEOUS) IMPLANT
CHLORAPREP W/TINT 26ML (MISCELLANEOUS) ×1 IMPLANT
CLIP APPLIE 5 13 M/L LIGAMAX5 (MISCELLANEOUS) IMPLANT
CLIP APPLIE ROT 10 11.4 M/L (STAPLE) IMPLANT
CLIP LIGATING HEM O LOK PURPLE (MISCELLANEOUS) IMPLANT
CLIP LIGATING HEMO O LOK GREEN (MISCELLANEOUS) IMPLANT
COUNTER NEEDLE 20 DBL MAG RED (NEEDLE) ×2 IMPLANT
COVER MAYO STAND STRL (DRAPES) ×4 IMPLANT
COVER TIP SHEARS 8 DVNC (MISCELLANEOUS) ×1 IMPLANT
COVER TIP SHEARS 8MM DA VINCI (MISCELLANEOUS) ×1
DECANTER SPIKE VIAL GLASS SM (MISCELLANEOUS) ×2 IMPLANT
DEVICE TROCAR PUNCTURE CLOSURE (ENDOMECHANICALS) IMPLANT
DRAIN CHANNEL 19F RND (DRAIN) IMPLANT
DRAPE ARM DVNC X/XI (DISPOSABLE) ×4 IMPLANT
DRAPE COLUMN DVNC XI (DISPOSABLE) ×1 IMPLANT
DRAPE DA VINCI XI ARM (DISPOSABLE) ×4
DRAPE DA VINCI XI COLUMN (DISPOSABLE) ×1
DRAPE SURG IRRIG POUCH 19X23 (DRAPES) ×1 IMPLANT
DRSG OPSITE POSTOP 4X10 (GAUZE/BANDAGES/DRESSINGS) IMPLANT
DRSG OPSITE POSTOP 4X6 (GAUZE/BANDAGES/DRESSINGS) ×1 IMPLANT
DRSG OPSITE POSTOP 4X8 (GAUZE/BANDAGES/DRESSINGS) IMPLANT
DRSG TEGADERM 2-3/8X2-3/4 SM (GAUZE/BANDAGES/DRESSINGS) ×5 IMPLANT
DRSG TEGADERM 4X4.75 (GAUZE/BANDAGES/DRESSINGS) IMPLANT
ELECT PENCIL ROCKER SW 15FT (MISCELLANEOUS) ×4 IMPLANT
ELECT REM PT RETURN 15FT ADLT (MISCELLANEOUS) ×2 IMPLANT
ENDOLOOP SUT PDS II  0 18 (SUTURE)
ENDOLOOP SUT PDS II 0 18 (SUTURE) IMPLANT
EVACUATOR SILICONE 100CC (DRAIN) IMPLANT
GAUZE SPONGE 2X2 8PLY STRL LF (GAUZE/BANDAGES/DRESSINGS) ×1 IMPLANT
GAUZE SPONGE 4X4 12PLY STRL (GAUZE/BANDAGES/DRESSINGS) IMPLANT
GLOVE ECLIPSE 8.0 STRL XLNG CF (GLOVE) ×6 IMPLANT
GLOVE INDICATOR 8.0 STRL GRN (GLOVE) ×6 IMPLANT
GOWN STRL REUS W/TWL XL LVL3 (GOWN DISPOSABLE) ×10 IMPLANT
GRASPER ENDOPATH ANVIL 10MM (MISCELLANEOUS) IMPLANT
HOLDER FOLEY CATH W/STRAP (MISCELLANEOUS) ×2 IMPLANT
IRRIG SUCT STRYKERFLOW 2 WTIP (MISCELLANEOUS) ×2
IRRIGATION SUCT STRKRFLW 2 WTP (MISCELLANEOUS) ×1 IMPLANT
KIT PROCEDURE DA VINCI SI (MISCELLANEOUS)
KIT PROCEDURE DVNC SI (MISCELLANEOUS) ×1 IMPLANT
LEGGING LITHOTOMY PAIR STRL (DRAPES) ×1 IMPLANT
LUBRICANT JELLY K Y 4OZ (MISCELLANEOUS) IMPLANT
NDL INSUFFLATION 14GA 120MM (NEEDLE) ×1 IMPLANT
NEEDLE INSUFFLATION 14GA 120MM (NEEDLE) ×2 IMPLANT
PACK CARDIOVASCULAR III (CUSTOM PROCEDURE TRAY) ×2 IMPLANT
PACK COLON (CUSTOM PROCEDURE TRAY) ×2 IMPLANT
PAD POSITIONING PINK XL (MISCELLANEOUS) ×2 IMPLANT
PORT LAP GEL ALEXIS MED 5-9CM (MISCELLANEOUS) ×2 IMPLANT
RELOAD STAPLE 45 BLU REG DVNC (STAPLE) IMPLANT
RELOAD STAPLE 45 GRN THCK DVNC (STAPLE) IMPLANT
RETRACTOR WND ALEXIS 18 MED (MISCELLANEOUS) IMPLANT
RTRCTR WOUND ALEXIS 18CM MED (MISCELLANEOUS)
SCISSORS LAP 5X35 DISP (ENDOMECHANICALS) ×2 IMPLANT
SEAL CANN UNIV 5-8 DVNC XI (MISCELLANEOUS) ×3 IMPLANT
SEAL XI 5MM-8MM UNIVERSAL (MISCELLANEOUS) ×3
SEALER VESSEL DA VINCI XI (MISCELLANEOUS) ×1
SEALER VESSEL EXT DVNC XI (MISCELLANEOUS) ×1 IMPLANT
SLEEVE ADV FIXATION 5X100MM (TROCAR) ×2 IMPLANT
SOLUTION ELECTROLUBE (MISCELLANEOUS) ×2 IMPLANT
SPONGE GAUZE 2X2 STER 10/PKG (GAUZE/BANDAGES/DRESSINGS) ×1
STAPLER 45 BLU RELOAD XI (STAPLE) ×5 IMPLANT
STAPLER 45 BLUE RELOAD XI (STAPLE) ×5
STAPLER 45 GREEN RELOAD XI (STAPLE)
STAPLER 45 GRN RELOAD XI (STAPLE) IMPLANT
STAPLER CANNULA SEAL DVNC XI (STAPLE) ×1 IMPLANT
STAPLER CANNULA SEAL XI (STAPLE) ×1
STAPLER SHEATH (SHEATH) ×1
STAPLER SHEATH ENDOWRIST DVNC (SHEATH) ×1 IMPLANT
SUT MNCRL AB 4-0 PS2 18 (SUTURE) ×2 IMPLANT
SUT PDS AB 0 CT1 36 (SUTURE) ×1 IMPLANT
SUT PDS AB 1 CTX 36 (SUTURE) ×4 IMPLANT
SUT PDS AB 1 TP1 96 (SUTURE) IMPLANT
SUT PDS AB 2-0 CT2 27 (SUTURE) IMPLANT
SUT PROLENE 0 CT 2 (SUTURE) ×1 IMPLANT
SUT PROLENE 2 0 KS (SUTURE) ×1 IMPLANT
SUT PROLENE 2 0 SH DA (SUTURE) IMPLANT
SUT SILK 2 0 (SUTURE) ×2
SUT SILK 2 0 SH CR/8 (SUTURE) ×2 IMPLANT
SUT SILK 2-0 18XBRD TIE 12 (SUTURE) ×1 IMPLANT
SUT SILK 3 0 (SUTURE) ×2
SUT SILK 3 0 SH CR/8 (SUTURE) ×2 IMPLANT
SUT SILK 3-0 18XBRD TIE 12 (SUTURE) ×1 IMPLANT
SUT V-LOC BARB 180 2/0GR6 GS22 (SUTURE) ×4
SUT VIC AB 3-0 SH 18 (SUTURE) IMPLANT
SUT VIC AB 3-0 SH 27 (SUTURE)
SUT VIC AB 3-0 SH 27XBRD (SUTURE) IMPLANT
SUT VICRYL 0 UR6 27IN ABS (SUTURE) ×3 IMPLANT
SUTURE V-LC BRB 180 2/0GR6GS22 (SUTURE) IMPLANT
SYR 10ML LL (SYRINGE) ×2 IMPLANT
SYS LAPSCP GELPORT 120MM (MISCELLANEOUS)
SYSTEM LAPSCP GELPORT 120MM (MISCELLANEOUS) IMPLANT
TAPE UMBILICAL COTTON 1/8X30 (MISCELLANEOUS) ×2 IMPLANT
TOWEL OR 17X26 10 PK STRL BLUE (TOWEL DISPOSABLE) IMPLANT
TOWEL OR NON WOVEN STRL DISP B (DISPOSABLE) ×2 IMPLANT
TRAY FOLEY W/METER SILVER 16FR (SET/KITS/TRAYS/PACK) ×1 IMPLANT
TROCAR ADV FIXATION 5X100MM (TROCAR) IMPLANT
TUBING CONNECTING 10 (TUBING) IMPLANT
TUBING INSUFFLATION 10FT LAP (TUBING) ×2 IMPLANT

## 2016-10-24 NOTE — Anesthesia Procedure Notes (Signed)
Procedure Name: Intubation Date/Time: 10/24/2016 7:43 AM Performed by: Maxwell Caul Pre-anesthesia Checklist: Patient identified, Emergency Drugs available, Suction available and Patient being monitored Patient Re-evaluated:Patient Re-evaluated prior to inductionOxygen Delivery Method: Circle system utilized Preoxygenation: Pre-oxygenation with 100% oxygen Intubation Type: IV induction Ventilation: Mask ventilation without difficulty and Oral airway inserted - appropriate to patient size Laryngoscope Size: Mac and 4 Grade View: Grade III Tube type: Oral Tube size: 7.5 mm Number of attempts: 1 Airway Equipment and Method: Stylet and Oral airway Placement Confirmation: ETT inserted through vocal cords under direct vision,  positive ETCO2 and breath sounds checked- equal and bilateral Secured at: 22 cm Tube secured with: Tape Dental Injury: Teeth and Oropharynx as per pre-operative assessment

## 2016-10-24 NOTE — Op Note (Signed)
10/24/2016  11:14 AM  PATIENT:  Jason [name]  83 y.o. male  Patient Care Team: [name], MD as PCP - General (Internal Medicine) [name], MD as Consulting Physician (General Surgery) [name], MD as Consulting Physician (Gastroenterology) [name], MD as Consulting Physician (Cardiology)  PRE-OPERATIVE DIAGNOSIS:  Cecal cancer  POST-OPERATIVE DIAGNOSIS:  Cecal cancer  PROCEDURE:  XI ROBOT PROXIMAL RIGHT COLECTOMY    SURGEON:  Adin Hector, MD  ASSISTANT: Gurney Maxin, MD   ANESTHESIA:   local and general  EBL:  Total I/O In: 2000 [I.V.:2000] Out: 225 [Urine:175; Blood:50]  Delay start of Pharmacological VTE agent (>24hrs) due to surgical blood loss or risk of bleeding:  no  DRAINS: none   SPECIMEN:  PROXIMAL "RIGHT" COLON  DISPOSITION OF SPECIMEN:  PATHOLOGY  COUNTS:  YES  PLAN OF CARE: Admit to inpatient   PATIENT DISPOSITION:  PACU - hemodynamically stable.  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  No evidence of metastatic disease.  I recommended segmental resection:  The anatomy & physiology of the digestive tract was discussed.  The pathophysiology was discussed.  Natural history risks without surgery was discussed.   I worked to give an overview of the disease and the frequent need to have multispecialty involvement.  I feel the risks of no intervention will lead to serious problems that outweigh the operative risks; therefore, I recommended a partial colectomy to remove the pathology.  Laparoscopic & open techniques were discussed.    Risks such as bleeding, infection, abscess, leak, reoperation, possible ostomy, hernia, heart attack, death, and other risks were discussed.  I noted a good likelihood this will help address the problem.   Goals of post-operative recovery were discussed as well.  We will work to minimize complications.  An educational handout on the pathology was given as well.  Questions were answered.     The patient expresses understanding & wishes to proceed with surgery.  OR FINDINGS:   Patient had thick flat tumor involving half of the cecum.  Patient had very redundant stretched out colon, especially sigmoid.  No obvious metastatic disease on visceral parietal peritoneum or liver.  It is an ileocolonic anastomosis that rests in the epigastric region.  DESCRIPTION:   Informed consent was confirmed.  The patient underwent general anaesthesia without difficulty.  Because a recent diagnosis of urinary tract infection, gentamicin was given in addition upon recommendation by Dr Matilde Sprang with urology.  The patient was positioned with arms tucked & secured appropriately.  VTE prevention in place.  The patient's abdomen was clipped, prepped, & draped in a sterile fashion.  Surgical timeout confirmed our plan.  The patient was positioned in reverse Trendelenburg.  Abdominal entry was gained using Veress needle technique using trach hook on the anterior abdominal wall fascia for countertension in the left upper abdomen.  Entry was clean.  I induced carbon dioxide insufflation.  Camera inspection revealed no injury.  Extra ports were carefully placed under direct laparoscopic visualization.  We docked the [employer] Vinci robot carefully and placed intstruments under visualization  I mobilized & reflected the greater omentum in the upper abdomen.  He had a cecal bascule that was still rather adherent to the right lower quadrant and pelvic sidewall.  His colon was quite stretched out especially on the left side and sigmoid colon.  Dilated.  Visualization was more tight.   Eventually mobilized the cecum and a lateral to medial fashion a little bit  had some thickening adhesions to the sidewall so end up taking some of that and some sidewall fat to ensure good margins.  Eventually was able to get a little more mobility on the right colon.  I was able to elevate the proximal colon to isolate the ileocolonic  pedicle.  I scored the ileal mesentery just proximal to that.   I carried that further dissection in a medial to lateral fashion.  I was able to bluntly get into the retro-mesenteric plane on the right side.  I freed the proximal right sided colonic mesentery off the retroperitoneum including the duodenal sweep, pancreatic head, & Gerota's fascia of the right kidney. I was able to get underneath the hepatic flexure.  I was able to get underneath the proximal and mid transverse colon.  I isolated the proximal ileocecal pedicle.  I skeletonized it & transected the vessels.    I did further mobilization and isolated the right branch of the middle colic pedicle.  Transected the proximal transverse colon mesentery towards that could better define the distal margin   I then proceeded to mobilize the terminal ileum & proximal "right" colon in a lateral to medial fashion.  I mobilized the distal ileal mesentery off its retroperitoneal and pelvic attachments.  I took care to mobilized the proximal right colon off the retroperitoneum and gonadal vessels and keep them retroperitoneal.  I was able to stay anterior & mostly lateral to the right ureter  I mobilized the ascending colon off Itss side wall attachments to the paracolic gutter and retroperitoneum.  I also mobilized the greater omentum off the mid transverse colon and mobilized the mid to proximal transverse colon in a superior to inferior fashion off the retroperitoneum.  This allowed me to mobilize the hepatic flexure and get a complete mobilization of the proximal "right" colon to the mid-transverse colon..  I could isolate the pathology.  Bulky mass involving half of the cecal circumference.  I went ahead and transected the distal ileal mesentery radially.  Transected the mid transverse colon mesentery just proximal to the right middle colic pedicle.    When he went ahead and proceeded with transection.  I transected the distal ileum with a robotic stapler.  I  then transected at the proximal transverse colon with a robotic stapler x 2 firings.  We assured hemostasis.   I did a side-to-side stapled anastomosis of ileum to mid-transverse colon using a 35mm robotic stapler x 2 firings in an isoperistaltic fashion.  (Distal stump of ileum to mid transverse colon for the distal end of the anastomosis.  Proximal end of colon stump to more proximal ileum for the proximal end of the anastomosis).  I sewed the common staple channel wound with an absorbable suture ( 2-0 V-lock) in a running [place] fashion from each corner and meeting in the center.  I did meticulous inspection prove an airtight closure.  I protected the anastomosis line with an anterior omentopexy of greater omentum using V lock suture.  We did reinspection of the abdomen.  Hemostasis was good.   Ureters, retroperitoneum, and bowel uninjured.  The anastomosis looked healthy.   We did a final irrigation of antibiotic solution (900 mg clindamycin/240 mg gentamicin in a liter of crystalloid) & held that while we placed the wound protector through a Pfannenstiel incision in the suprapubic region.  Had to make it 6 cm to get the specimen out.  Specimen removed without incident.   Endoluminal gas was evacuated.  Ports &  wound protector removed.  We changed gloves & redraped the patient per colon SSI prevention protocol.  We aspirated the antibiotic irrigation.  Hemostasis was good.  Sterile unused instruments were used from this point.  I closed the 80mm port sites using Monocryl stitch and sterile dressing.  I closed the extraction wound using a 0 PDS vertical peritoneal closure and a #1 PDS transverse anterior rectal fascial closure  in the classic Pfannenstiel closure.   A 12 mm port site closed with 0 PDS suture.  Port sites closed with 4 Monocryl suture.  Suprapubic Pfannenstiel incision closed with some interrupted Monocryl stitches. I placed antibiotic-soaked wicks into the closure at the corners x2.  I placed  sterile dressings.     Patient is being extubated go to recovery room. I discussed postop care with the patient in detail the office & in the holding area. Instructions are written. I updated the patient's status to the family.  Recommendations were made.  Questions were answered.  The family expressed understanding & appreciation.  Adin Hector, M.D., F.A.C.S. Gastrointestinal and Minimally Invasive Surgery [facility] [address] [place], [place] [address] [phone] Main / Paging

## 2016-10-24 NOTE — Consult Note (Signed)
[facility] Nurse requested for preoperative stoma site marking by Dr. Johney [place].  Discussed surgical procedure and possibility of intraoperative stoma creation with patient and wife, who is a retired Therapist, sports.  Explained role of the [place] nurse team. Answered patient and wife's questions. Both hope that an ostomy is not required, but understand they would have an ostomy nurse post operatively and help at home post discharge if that was necessary.  Examined patient lying, sitting, and standing in order to place the marking in the patient's visual field, away from any creases or abdominal contour issues and within the rectus muscle.  Attempted to mark below the patient's belt line but this is not possible.  Ventral hernia and creases in the LLQ and just above umbilicus require marking in LUQ.   Marked for colostomy in the LLQ  7cm to the left of the umbilicus and [id number] above the umbilicus.  Patient's abdomen cleansed with CHG wipes at site markings, allowed to air dry prior to marking. Covered mark with thin film transparent dressing to preserve mark until surgical procedure.  [place] nursing team will remain available to this patient, the nursing, surgical and medical teams.  Please re-consult if an ostomy is created.  Thanks, Maudie Flakes, MSN, RN, [place], Arther Abbott  Pager# [phone]

## 2016-10-24 NOTE — Transfer of Care (Signed)
Immediate Anesthesia Transfer of Care Note  Patient: Jason [name]  Procedure(s) Performed: Procedure(s): XI ROBOT PROXIMAL RIGHT  COLECTOMY  ERAS PATHWAY (N/A)  Patient Location: PACU  Anesthesia Type:General  Level of Consciousness:  sedated, patient cooperative and responds to stimulation  Airway & Oxygen Therapy:Patient Spontanous Breathing and Patient connected to face mask oxgen  Post-op Assessment:  Report given to PACU RN and Post -op Vital signs reviewed and stable  Post vital signs:  Reviewed and stable  Last Vitals:  Vitals:   10/24/16 0541  BP: (!) 125/57  Pulse: (!) 52  Resp: 16  Temp: [id number] C    Complications: No apparent anesthesia complications

## 2016-10-24 NOTE — Interval H&P Note (Signed)
History and Physical Interval Note:  10/24/2016 7:19 AM  Jason [name]  has presented today for surgery, with the diagnosis of Cecal cancer  The various methods of treatment have been discussed with the patient and family. After consideration of risks, benefits and other options for treatment, the patient has consented to  Procedure(s): XI [place] (N/A) as a surgical intervention .  The patient's history has been reviewed, patient examined, no change in status, stable for surgery.  I have reviewed the patient's chart and labs.  Questions were answered to the patient's satisfaction.   Diagnosis of possible UTI.  Rx Bactrim given.  Pt does not know if he took it or not  I have re-reviewed the the patient's records, history, medications, and allergies.  I have re-examined the patient.  I again discussed intraoperative plans and goals of post-operative recovery.  The patient agrees to proceed.  Jason [name]  Jason [name], Jason [name] [id number]  Patient Care Team: [name], MD as PCP - General (Internal Medicine) [name], MD as Consulting Physician (General Surgery) [name], MD as Consulting Physician (Gastroenterology) [name], MD as Consulting Physician (Cardiology)  Patient Active Problem List   Diagnosis Date Noted  . 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 RLQ abdominal pain 08/29/2016  . Abnormal abdominal CT scan 08/29/2016  . Chronic anticoagulation 08/29/2016  . Symptomatic anemia 08/22/2016  . Iron deficiency anemia due to chronic blood loss 06/29/2016  . Melena   . Angiodysplasia of stomach and duodenum without bleeding   . GIB (gastrointestinal bleeding) 09/14/2014  . Atrial fibrillation ([place]) 07/15/2014  . PVC's (premature ventricular contractions) 12/05/2012  . Fatigue 12/12/2011  . Heart palpitations 01/24/2011  . CAD (coronary artery disease)   . Aortic stenosis   . Dyslipidemia   . Depression   . ANXIETY  11/06/2008  . Aortic valve disorder 11/06/2008  . CONSTIPATION 11/06/2008  . IDIOPATHIC OSTEOPOROSIS 11/06/2008  . OSA (obstructive sleep apnea) 11/06/2008  . HEMORRHOIDS 11/05/2008  . DIVERTICULOSIS, COLON 11/05/2008  . Personal history of colonic polyps 11/05/2008    Past Medical History:  Diagnosis Date  . Abnormal PSA   . Adenomatous polyp of colon 2010 and before 2006  . Anemia   . Aortic stenosis   . Aortic valve prosthesis present   . Atrial fibrillation ([place])   . CAD (coronary artery disease)   . DDD (degenerative disc disease), lumbar   . Depression   . Diverticulosis   . Dyslipidemia   . GERD (gastroesophageal reflux disease)   . Glaucoma   . History of blood transfusion 09/29/2016  . Hx of CABG   . Hypogonadism, male   . Macular degeneration   . OSA (obstructive sleep apnea)   . Osteoarthritis   . Osteopenia   . Prostate CA [facility]) prostate 2007   colon dx 2018, watching psa levels, no tx yet for prostate    Past Surgical History:  Procedure Laterality Date  . AORTIC VALVE REPLACEMENT  October 2011   Magna Ease pericardial tissue valve #26mm  . [place]   lower  . CATARACT EXTRACTION Bilateral   . CORNEAL TRANSPLANT Right   . CORONARY ARTERY BYPASS GRAFT  October 2011   LIMA to LAD  . ESOPHAGOGASTRODUODENOSCOPY N/A 09/16/2014   Procedure: ESOPHAGOGASTRODUODENOSCOPY (EGD);  Surgeon: [name], MD;  Location: [facility]COPY;  Service: Endoscopy;  Laterality: N/A;  . TONSILLECTOMY  74 yrs  ago    Social History   Social History  . Marital status: Married    Spouse name: Izora Gala  . Number of children: 2  . Years of education: N/A   Occupational History  . Not on file.   Social History Main Topics  . Smoking status: Former Smoker    Packs/day: 1.00    Years: 40.00    Types: Cigarettes    Quit date: 11/16/1990  . Smokeless tobacco: Never Used  . Alcohol use 1.2 oz/week    2 Glasses of wine per week     Comment: wine few x per week  .  Drug use: No  . Sexual activity: Not on file   Other Topics Concern  . Not on file   Social History Narrative  . No narrative on file    Family History  Problem Relation Age of Onset  . Heart attack Father   . Colon cancer Neg Hx     Current Facility-Administered Medications  Medication Dose Route Frequency Provider Last Rate Last Dose  . bisacodyl (DULCOLAX) EC tablet 20 mg  20 mg Oral Once [name], MD      . bupivacaine liposome (EXPAREL) 1.3 % injection 266 mg  20 mL Infiltration On Call to OR [name], MD      . cefoTEtan (CEFOTAN) 2 g in dextrose 5 % 50 mL IVPB  2 g Intravenous On Call to OR [name], MD      . clindamycin (CLEOCIN) 900 mg, gentamicin (GARAMYCIN) 240 mg in sodium chloride 0.9 % 1,000 mL for intraperitoneal lavage   Intraperitoneal To OR [name], MD      . clindamycin (CLEOCIN) IVPB 900 mg  900 mg Intravenous 60 min Pre-Op [name], MD       And  . gentamicin (GARAMYCIN) 400 mg in dextrose 5 % 100 mL IVPB  5 mg/kg Intravenous 60 min Pre-Op [name], MD       Facility-Administered Medications Ordered in Other Encounters  Medication Dose Route Frequency Provider Last Rate Last Dose  . lactated ringers infusion    Continuous PRN Maxwell Caul, CRNA         No Known Allergies  BP (!) 125/57   Pulse (!) 52   Temp 97.7 F (36.5 C) (Oral)   Resp 16   Ht 5\' 11"  (1.803 m)   Wt 80.3 kg (177 lb)   SpO2 96%   BMI 24.69 kg/m   Labs: Results for orders placed or performed during the hospital encounter of 10/24/16 (from the past 48 hour(s))  Type and screen [place]     Status: None   Collection Time: 10/24/16  5:40 AM  Result Value Ref Range   ABO/RH(D) A NEG    Antibody Screen NEG    Sample Expiration 10/27/2016   PT- INR Day of Surgery     Status: None   Collection Time: 10/24/16  5:40 AM  Result Value Ref Range   Prothrombin Time 15.1 11.4 - 15.2 seconds   INR 1.19   Hemoglobin     Status: Abnormal    Collection Time: 10/24/16  5:40 AM  Result Value Ref Range   Hemoglobin 8.4 (L) 13.0 - 17.0 g/dL  ABO/Rh     Status: None   Collection Time: 10/24/16  5:40 AM  Result Value Ref Range   ABO/RH(D) A NEG     Imaging / Studies: Ct Chest W Contrast  Result Date: 10/02/2016 CLINICAL  DATA:  New diagnosis of cecal carcinoma. Rule out metastasis. Prior CABG and aortic valve repair. EXAM: CT CHEST WITH CONTRAST TECHNIQUE: Multidetector CT imaging of the chest was performed during intravenous contrast administration. CONTRAST:  49mL ISOVUE-300 IOPAMIDOL (ISOVUE-300) INJECTION 61% COMPARISON:  08/08/2016 abdominopelvic CT. Chest radiograph 07/15/2010. FINDINGS: Cardiovascular: Aortic and branch vessel atherosclerosis. Tortuous thoracic aorta. Moderate cardiomegaly with left atrial enlargement. Prior aortic valve repair and CABG. No central pulmonary embolism, on this non-dedicated study. Mediastinum/Nodes: No supraclavicular adenopathy. No mediastinal adenopathy. Borderline to mild right hilar adenopathy at 1.4 cm on image 68/series 2. Lungs/Pleura: No pleural fluid. Minimal motion degradation. Moderate centrilobular emphysema. Biapical pleural-parenchymal scarring. 2 mm subpleural right middle lobe pulmonary nodule on image 107/ series 5. Superior segment right lower lobe 2 mm subpleural nodule on image 58/series 5. Upper Abdomen: Normal imaged portions of the liver, spleen, stomach, pancreas, right adrenal gland. Mild left adrenal thickening. 5 mm upper pole left renal collecting system calculus. Upper pole right renal scarring. Advanced abdominal aortic and branch vessel atherosclerosis. Musculoskeletal: Osteopenia.  Moderate thoracic spondylosis. IMPRESSION: 1. No typical findings of thoracic metastasis. 2. Tiny pulmonary nodules are subpleural in distribution and likely subpleural lymph nodes. 3. Borderline to mild right hilar adenopathy is favored to be reactive. Not in the typical initial drainage pattern for  cecal primary. Recommend attention on follow-up. 4. Left nephrolithiasis with mild right renal scarring. 5. Status post median sternotomy for CABG and aortic valve repair. Electronically Signed   By: Abigail Miyamoto M.D.   On: 10/02/2016 16:33     .Adin Hector, M.D., F.A.C.S. Gastrointestinal and Minimally Invasive Surgery [facility] [address] [place], [place] [address] [phone] Main / Paging  10/24/2016 7:20 AM    Maysen Sudol C.

## 2016-10-24 NOTE — H&P (View-Only) (Signed)
Jason [name] 09/25/2016 10:13 AM Location: [facility] Patient #: [id number] DOB: 12/30/1925 Married / Language: English / Race: White Male  Patient Care Team: [name], MD as PCP - General (Internal Medicine) [name], MD as Consulting Physician (General Surgery) [name], MD as Consulting Physician (Gastroenterology) [name], MD as Consulting Physician (Cardiology)   History of Present Illness [name]; 09/25/2016 5:38 PM) The patient is a 83 year old male who presents with colorectal cancer. Note for "Colorectal cancer": Patient sent for surgical consultation by his gastroenterologist, [name] with [facility]. 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  Pleasant elderly gentleman. Comes today with his wife. Elderly but does work out at [employer] with exercise classes. Required aortic valve replacement surgery by [name] in 2011. Has atrial fibrillation. Good ejection fraction normal 2015 according to his cardiologist Dr. Martinique. Struggled with iron deficiency anemia. Has had endoscopies in the past. Colon in past had been underwhelming. 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 Still has persistent anemia. Had some abdominal pain. Prostate cancer that seems to be very slow growing and monitored at this point. No seeds or radiation or surgery. Follwed by Dr Risa Grill.  Because of abdominal pain, he underwent a CAT scan last month. Mass at cecum. ? tumor versus stool. Colonocscopy done by Dr Henrene Pastor & found to have a cecal tumor. Biopsy consistent with adenocarcinoma. No obvious metastatic disease in abdomen and pelvis. Surgical consultation requested. Patient claims to able to walk relatively well. Takes exercise classes. Bleeding on Xerelto so has been on warfarin. INR around 2. Not smoke cigarettes in over 25 years. Glasses of wine a week. He is hard of hearing but no balancer fall issues. No  abdominal surgery. I blood transfusions and iron infusions in the past month or so due to worsening anemia.  No personal nor family history of inflammatory bowel disease, irritable bowel syndrome, allergy such as Celiac Sprue, dietary/dairy problems, colitis, ulcers nor gastritis. No recent sick contacts/gastroenteritis. No travel outside the country. No changes in diet. No dysphagia to solids or liquids. No significant heartburn or reflux. No hematochezia, hematemesis, coffee ground emesis. No evidence of prior gastric/peptic ulceration.   Past Surgical History ([name], [name]; 09/25/2016 10:13 AM) Cataract Surgery Bilateral. Colon Polyp Removal - Colonoscopy Coronary Artery Bypass Graft Spinal Surgery - Lower Back Valve Replacement  Allergies ([name], [name]; 09/25/2016 10:13 AM) No Known Drug Allergies 09/25/2016  Medication History ([name], [name]; 09/25/2016 10:28 AM) Acetaminophen (500MG  Tablet, Oral) Active. Calcium w/Vitamin D & Iron (Oral) Active. Dorzolamide HCl (2% Solution, Ophthalmic) Active. Flonase (50MCG/ACT Suspension, Nasal) Active. Glucosamine Chondroitin Complx (500-400MG  Tablet, Oral) Active. Metoprolol Tartrate (25MG  Tablet, Oral) Active. Multiple Vitamin (Oral) Active. Omega-3-acid Ethyl Esters (1GM Capsule, Oral) Active. Perphenazine-Amitriptyline (4-25MG  Tablet, Oral) Active. PrednisoLONE Acetate (1% Suspension, Ophthalmic) Active. Rosuvastatin Calcium (5MG  Tablet, Oral) Active. Warfarin Sodium (5MG  Tablet, Oral) Active. ICaps Lutein & Zeaxanthin (Oral) Active. Vitamin B-12 (1000MCG Tablet, Oral) Active.  Social History ([name], [name]; 09/25/2016 10:13 AM) Alcohol use Moderate alcohol use. Caffeine use Coffee. No drug use Tobacco use Former smoker.  Family History ([name], [name]; 09/25/2016 10:13 AM) Alcohol Abuse Father. Arthritis Mother, Sister. Breast Cancer Mother. Depression Mother. Heart Disease  Father. Heart disease in male family member before age 69  Other Problems ([name], [name]; 09/25/2016 10:13 AM) Arthritis Atrial Fibrillation Depression Gastroesophageal Reflux Disease Hemorrhoids Hypercholesterolemia Kidney Stone Prostate Cancer Sleep Apnea Transfusion history  Review of Systems ([name] [name]; 09/25/2016 10:13 AM) General Present- Fatigue. Not Present- Appetite Loss, Chills, Fever, Night Sweats, Weight Gain and Weight Loss. Skin Not Present- Change in Wart/Mole, Dryness, Hives, Jaundice, New Lesions, Non-Healing Wounds, Rash and Ulcer. HEENT Present- Hearing Loss, Seasonal Allergies and Wears glasses/contact lenses. Not Present- Earache, Hoarseness, Nose Bleed, Oral Ulcers, Ringing in the Ears, Sinus Pain, Sore Throat, Visual Disturbances and Yellow Eyes. Respiratory Not Present- Bloody sputum, Chronic Cough, Difficulty Breathing, Snoring and Wheezing. Breast Not Present- Breast Mass, Breast Pain, Nipple Discharge and Skin Changes. Cardiovascular Present- Shortness of Breath. Not Present- Chest Pain, Difficulty Breathing Lying Down, Leg Cramps, Palpitations, Rapid Heart Rate and Swelling of Extremities. Gastrointestinal Present- Abdominal Pain, Change in Bowel Habits and Excessive gas. Not Present- Bloating, Bloody Stool, Chronic diarrhea, Constipation, Difficulty Swallowing, Gets full quickly at meals, Hemorrhoids, Indigestion, Nausea, Rectal Pain and Vomiting. Male Genitourinary Present- Impotence and Nocturia. Not Present- Blood in Urine, Change in Urinary Stream, Frequency, Painful Urination, Urgency and Urine Leakage. Musculoskeletal Present- Back Pain and Joint Pain. Not Present- Joint Stiffness, Muscle Pain, Muscle Weakness and Swelling of Extremities. Neurological Not Present- Decreased Memory, Fainting, Headaches, Numbness, Seizures, Tingling, Tremor, Trouble walking and Weakness. Psychiatric Present- Depression. Not Present- Anxiety,  Bipolar, Change in Sleep Pattern, Fearful and Frequent crying. Endocrine Not Present- Cold Intolerance, Excessive Hunger, Hair Changes, Heat Intolerance, Hot flashes and New Diabetes. Hematology Present- Blood Thinners. Not Present- Easy Bruising, Excessive bleeding, Gland problems, HIV and Persistent Infections.  Vitals ([name] [name]; 09/25/2016 10:19 AM) 09/25/2016 10:18 AM Weight: 180.13 lb Height: 71in Body Surface Area: 2.02 m Body Mass Index: 25.12 kg/m  Temp.: 98.68F(Oral)  Pulse: 81 (Regular)  BP: 130/72 (Sitting, Left Arm, Standard)      Physical Exam [name]; 09/25/2016 11:40 AM)  General Mental Status-Alert. General Appearance-Not in acute distress, Not Sickly. Orientation-Oriented X3. Hydration-Well hydrated. Voice-Normal.  Integumentary Global Assessment Upon inspection and palpation of skin surfaces of the - Axillae: non-tender, no inflammation or ulceration, no drainage. and Distribution of scalp and body hair is normal. General Characteristics Temperature - normal warmth is noted.  Head and Neck Head-normocephalic, atraumatic with no lesions or palpable masses. Face Global Assessment - atraumatic, no absence of expression. Neck Global Assessment - no abnormal movements, no bruit auscultated on the right, no bruit auscultated on the left, no decreased range of motion, non-tender. Trachea-midline. Thyroid Gland Characteristics - non-tender.  Eye Eyeball - Left-Extraocular movements intact, No Nystagmus. Eyeball - Right-Extraocular movements intact, No Nystagmus. Cornea - Left-No Hazy. Cornea - Right-No Hazy. Sclera/Conjunctiva - Left-No scleral icterus, No Discharge. Sclera/Conjunctiva - Right-No scleral icterus, No Discharge. Pupil - Left-Direct reaction to light normal. Pupil - Right-Direct reaction to light normal. Note: Wears glasses. Vision corrected  ENMT Ears Pinna - Left - no drainage  observed, no generalized tenderness observed. Right - no drainage observed, no generalized tenderness observed. Nose and Sinuses External Inspection of the Nose - no destructive lesion observed. Inspection of the nares - Left - quiet respiration. Right - quiet respiration. Mouth and Throat Lips - Upper Lip - no fissures observed, no pallor noted. Lower Lip - no fissures observed, no pallor noted. Nasopharynx - no discharge present. Oral Cavity/Oropharynx - Tongue - no dryness observed. Oral Mucosa - no cyanosis observed. Hypopharynx - no evidence of airway distress observed. Note: Hard of hearing  Chest and Lung Exam Inspection Movements - Normal and Symmetrical. Accessory muscles - No use of accessory muscles in breathing. Palpation  Palpation of the chest reveals - Non-tender. Auscultation Breath sounds - Normal and Clear.  Cardiovascular Auscultation Rhythm - Regular. Murmurs & Other Heart Sounds - Auscultation of the heart reveals - No Murmurs and No Systolic Clicks.  Abdomen Inspection Inspection of the abdomen reveals - No Visible peristalsis and No Abnormal pulsations. Umbilicus - No Bleeding, No Urine drainage. Palpation/Percussion Palpation and Percussion of the abdomen reveal - Soft, Non Tender, No Rebound tenderness, No Rigidity (guarding) and No Cutaneous hyperesthesia. Note: Abdomen soft. Nontender, nondistended. No guarding. No diastasis. No umbilical nor other hernias  Male Genitourinary Sexual Maturity Tanner 5 - Adult hair pattern and Adult penile size and shape. Note: No inguinal hernias. Normal external genitalia. Epididymi, testes, and spermatic cords normal without any masses.  Peripheral Vascular Upper Extremity Inspection - Left - No Cyanotic nailbeds, Not Ischemic. Right - No Cyanotic nailbeds, Not Ischemic.  Neurologic Neurologic evaluation reveals -normal attention span and ability to concentrate, able to name objects and repeat phrases. Appropriate  fund of knowledge , normal sensation and normal coordination. Mental Status Affect - not angry, not paranoid. Cranial Nerves-Normal Bilaterally. Gait-Normal.  Neuropsychiatric Mental status exam performed with findings of-able to articulate well with normal speech/language, rate, volume and coherence, thought content normal with ability to perform basic computations and apply abstract reasoning and no evidence of hallucinations, delusions, obsessions or homicidal/suicidal ideation.  Musculoskeletal Global Assessment Spine, Ribs and Pelvis - no instability, subluxation or laxity. Right Upper Extremity - no instability, subluxation or laxity.  Lymphatic Head & Neck  General Head & Neck Lymphatics: Bilateral - Description - No Localized lymphadenopathy. Axillary  General Axillary Region: Bilateral - Description - No Localized lymphadenopathy. Femoral & Inguinal  Generalized Femoral & Inguinal Lymphatics: Left - Description - No Localized lymphadenopathy. Right - Description - No Localized lymphadenopathy.    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  Standard of care would be metastatic workup and probable segmental colonic resection.  CT of abdomen and pelvis done last month otherwise negative. Would complete with CT of chest with IV contrast. Also order CEA level.  As stated, while he is of advanced age he is actually quite functional and active. I think he he has many more years of life in him for that cancer to get him into trouble. No evidence of metastatic disease, so potentially curable with surgery only. Be a good candidate for a minimally invasive approach such as robotic/laparoscopic resection with intracorporeal anastomosis.  I definitely would like cardiac clearance first from Dr. Martinique. Make sure it is safe to  come off of warfarin. He is able to hold that and not need a Lovenox bridge for the colonoscopy. The patient did have an echocardiogram done in the past 2 years but 2015 showed good LV functioning and aortic valve prosthesis working well.  I discussed at length with the patient and his wife. They wish to be aggressive and proceed with surgery but deftly want to make sure that primary care physician and cardiology are involved.  Current Plans I recommended obtaining preoperative cardiac clearance and recommendations on management of anticoagualtion perioperatively:  1. Timing of holding anticoagulation 2. Need for any bridge therapy (SQ enoxaparin, IV heparin, IV Aggrastat, etc) preop/postop. 3. Desired timing of resumption of anticoagulation.  In general from Dr. Johney Maine' standpoint:  Aspirin is okay to continue perioperatively (81mg  or 325mg ) &  does not need to be held  Hold warfarin 5 dayspreoperatively. Consider PT/INR level on arrival to short stay the day of surgery. No need to check PT/INR level on preop visit  Hold P2Y12 inibitors such as clopidrogel (Plavix) 4 dayspreoperatively  Hold direct thrombin / factor Xa inhibitors (Xerelto, Pradaxa, Eliquis, etc) 2 dayspreoperatively   Request clearance by cardiology to better assess operative risk & see if a reevaluation, further workup, etc is needed. Also recommendations on how medications such as for anticoagulation and blood pressure should be managed/held/restarted after surgery.  PREOP COLON - ENCOUNTER FOR PREOPERATIVE EXAMINATION FOR GENERAL SURGICAL PROCEDURE (Z01.818)  Current Plans You are being scheduled for surgery- Our schedulers will call you.  You should hear from our office's scheduling department within 5 working days about the location, date, and time of surgery. We try to make accommodations for patient's preferences in scheduling surgery, but sometimes the OR schedule or the surgeon's  schedule prevents [place] from making those accommodations.  If you have not heard from our office [phone]) in 5 working days, call the office and ask for your surgeon's nurse.  If you have other questions about your diagnosis, plan, or surgery, call the office and ask for your surgeon's nurse.  Written instructions provided Pt Education - [facility] Colon Bowel Prep 2015 Miralax/Antibiotics Started Neomycin Sulfate 500MG , 2 (two) Tablet SEE NOTE, #6, 09/25/2016, No Refill. Local Order: TAKE TWO TABLETS AT 2 PM, 3 PM, AND 10 PM THE DAY PRIOR TO SURGERY Started Flagyl 500MG , 2 (two) Tablet SEE NOTE, #6, 09/25/2016, No Refill. Local Order: Take at 2pm, 3pm, and 10pm the day prior to your colon operation Pt Education - [facility] Colectomy post-op instructions: discussed with patient and provided information. Pt Education - [facility] Good Bowel Health (Shakeena Kafer) Pt Education - [facility] Pain Control (Worth Kober) Pt Education - Pamphlet Given - Laparoscopic Colorectal Surgery: discussed with patient and provided information.  Adin Hector, M.D., F.A.C.S. Gastrointestinal and Minimally Invasive Surgery [facility] [address], [place],  [address] [phone] Main / Paging

## 2016-10-24 NOTE — Anesthesia Postprocedure Evaluation (Signed)
Anesthesia Post Note  Patient: Jason [name]  Procedure(s) Performed: Procedure(s) (LRB): XI ROBOT PROXIMAL RIGHT  COLECTOMY  ERAS PATHWAY (N/A)  Patient location during evaluation: PACU Anesthesia Type: General Level of consciousness: sedated and patient cooperative Pain management: pain level controlled Vital Signs Assessment: post-procedure vital signs reviewed and stable Respiratory status: spontaneous breathing Cardiovascular status: stable Anesthetic complications: no       Last Vitals:  Vitals:   10/24/16 1145 10/24/16 1200  BP: (!) 112/58   Pulse: 83 87  Resp: (!) 25 18  Temp:      Last Pain:  Vitals:   10/24/16 1145  TempSrc:   PainSc: 0-No pain                 Nolon Nations

## 2016-10-25 LAB — BASIC METABOLIC PANEL
Anion gap: 6 (ref 5–15)
BUN: 11 mg/dL (ref 6–20)
CALCIUM: 8.6 mg/dL — AB (ref 8.9–10.3)
CO2: 26 mmol/L (ref 22–32)
CREATININE: 1.1 mg/dL (ref 0.61–1.24)
Chloride: 107 mmol/L (ref 101–111)
GFR calc non Af Amer: 58 mL/min — ABNORMAL LOW (ref >60)
Glucose, Bld: 131 mg/dL — ABNORMAL HIGH (ref 65–99)
Potassium: 4.2 mmol/L (ref 3.5–5.1)
SODIUM: 139 mmol/L (ref 135–145)

## 2016-10-25 LAB — CBC
HCT: 21.9 % — ABNORMAL LOW (ref 39.0–52.0)
Hemoglobin: 6.8 g/dL — CL (ref 13.0–17.0)
MCH: 24.4 pg — ABNORMAL LOW (ref 26.0–34.0)
MCHC: 31.1 g/dL (ref 30.0–36.0)
MCV: 78.5 fL (ref 78.0–100.0)
PLATELETS: 301 10*3/uL (ref 150–400)
RBC: 2.79 MIL/uL — AB (ref 4.22–5.81)
RDW: 19.2 % — ABNORMAL HIGH (ref 11.5–15.5)
WBC: 13.6 10*3/uL — ABNORMAL HIGH (ref 4.0–10.5)

## 2016-10-25 LAB — MAGNESIUM: Magnesium: 1.8 mg/dL (ref 1.7–2.4)

## 2016-10-25 MED ORDER — SODIUM CHLORIDE 0.9 % IV SOLN
500.0000 mg | Freq: Once | INTRAVENOUS | Status: AC
  Administered 2016-10-25: 500 mg via INTRAVENOUS
  Filled 2016-10-25: qty 10

## 2016-10-25 MED ORDER — ENOXAPARIN SODIUM 40 MG/0.4ML ~~LOC~~ SOLN
40.0000 mg | SUBCUTANEOUS | Status: DC
  Administered 2016-10-25 – 2016-10-27 (×2): 40 mg via SUBCUTANEOUS
  Filled 2016-10-25 (×2): qty 0.4

## 2016-10-25 MED ORDER — SODIUM CHLORIDE 0.9 % IV SOLN
25.0000 mg | Freq: Once | INTRAVENOUS | Status: AC
  Administered 2016-10-25: 25 mg via INTRAVENOUS
  Filled 2016-10-25: qty 0.5

## 2016-10-25 NOTE — Progress Notes (Signed)
Dr. Brantley Stage, Marcello Moores notified of the critical labs.

## 2016-10-25 NOTE — Progress Notes (Signed)
Pt tolerated initial dose of the Infed. Pharmacy has been notified.

## 2016-10-25 NOTE — Progress Notes (Signed)
[facility]  [address] [place], 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  Transfuse if worse or Sx.  Hold LMWH one day & follow -VTE prophylaxis- SCDs, etc -mobilize as tolerated to help recovery  I updated the patient's status to the patient and spouse.  Recommendations were made.  Questions were answered.  They expressed understanding & appreciation.   Adin Hector, M.D., F.A.C.S. Gastrointestinal and Minimally Invasive Surgery [facility] [address], [place], [place] [address] [phone] Main / Paging   10/25/2016  CARE TEAM:  PCP: Jerlyn Ly, MD  Outpatient Care Team: Patient Care Team: [name], MD as PCP - General (Internal Medicine) [name], MD as Consulting Physician (General Surgery) [name], MD as Consulting Physician (Gastroenterology) [name], MD as Consulting Physician (Cardiology)  Inpatient Treatment Team: Treatment Team: Attending Provider: [name], MD; Technician: Etheleen Sia, NT; Registered Nurse: Mortimer Fries, RN  Subjective:  Feels well Pain mild Walked in hallways x 2 Wife at bedise with questions from family  Objective:  Vital signs:  Vitals:   10/24/16 2108 10/25/16 0154 10/25/16 0500 10/25/16 0542   BP: (!) 119/51 (!) 148/66  132/61  Pulse: 81 86  86  Resp: _0 Temp: 98.2 F (36.8 C) 99.3 F (37.4 C)  97.5 F (36.4 C)  TempSrc: Oral Oral  Oral  SpO2: 99% 100%  100%  Weight:   88.9 kg (195 lb 15.8 oz)   Height:        Last BM Date: 10/23/16  Intake/Output   Yesterday:  02/27 0701 - 02/28 0700 In: 4975.8 [P.O.:1320; I.V.:3495.8; IV Piggyback:160] Out: 2175 [Urine:2125; Blood:50] This shift:  No intake/output data recorded.  Bowel function:  Flatus: YES  BM:  No  Drain: (No drain)   Physical Exam:  General: Pt awake/alert/oriented x4 in No acute distress Eyes: PERRL, normal EOM.  Sclera clear.  No icterus Neuro: CN II-XII intact w/o focal sensory/motor deficits. Lymph: No head/neck/groin lymphadenopathy Psych:  No delerium/psychosis/paranoia HENT: Normocephalic, Mucus membranes moist.  No thrush Neck: Supple, No tracheal deviation Chest: No chest wall pain w good excursion CV:  Pulses intact.  Regular rhythm MS: Normal AROM mjr joints.  No obvious deformity Abdomen: Soft.  Nondistended.  Mildly tender at incisions only.  No evidence of peritonitis.  No incarcerated hernias.  No ecchymosis Ext:  SCDs BLE.  No mjr edema.  No cyanosis Skin: No petechiae / purpura  Results:   Labs: Results for orders placed or performed during the hospital encounter of 10/24/16 (from the past 48 hour(s))  Type and screen [place]     Status: None   Collection Time: 10/24/16  5:40 AM  Result Value Ref Range   ABO/RH(D) A  NEG    Antibody Screen NEG    Sample Expiration 10/27/2016   PT- INR Day of Surgery     Status: None   Collection Time: 10/24/16  5:40 AM  Result Value Ref Range   Prothrombin Time 15.1 11.4 - 15.2 seconds   INR 1.19   Hemoglobin     Status: Abnormal   Collection Time: 10/24/16  5:40 AM  Result Value Ref Range   Hemoglobin 8.4 (L) 13.0 - 17.0 g/dL  ABO/Rh     Status: None   Collection Time: 10/24/16  5:40 AM  Result Value  Ref Range   ABO/RH(D) A NEG   Basic metabolic panel     Status: Abnormal   Collection Time: 10/25/16  4:17 AM  Result Value Ref Range   Sodium 139 135 - 145 mmol/L   Potassium 4.2 3.5 - 5.1 mmol/L   Chloride 107 101 - 111 mmol/L   CO2 26 22 - 32 mmol/L   Glucose, Bld 131 (H) 65 - 99 mg/dL   BUN 11 6 - 20 mg/dL   Creatinine, Ser 1.10 0.61 - 1.24 mg/dL   Calcium 8.6 (L) 8.9 - 10.3 mg/dL   GFR calc non Af Amer 58 (L) >60 mL/min   GFR calc Af Amer >60 >60 mL/min    Comment: (NOTE) The eGFR has been calculated using the CKD EPI equation. This calculation has not been validated in all clinical situations. eGFR's persistently <60 mL/min signify possible Chronic Kidney Disease.    Anion gap 6 5 - 15  CBC     Status: Abnormal   Collection Time: 10/25/16  4:17 AM  Result Value Ref Range   WBC 13.6 (H) 4.0 - 10.5 K/uL   RBC 2.79 (L) 4.22 - 5.81 MIL/uL   Hemoglobin 6.8 (LL) 13.0 - 17.0 g/dL    Comment: REPEATED TO VERIFY CRITICAL RESULT CALLED TO, READ BACK BY AND VERIFIED WITH: PEROTTE, P RN 2.28.18 _0  ZANDO,C    HCT 21.9 (L) 39.0 - 52.0 %   MCV 78.5 78.0 - 100.0 fL   MCH 24.4 (L) 26.0 - 34.0 pg   MCHC 31.1 30.0 - 36.0 g/dL   RDW 19.2 (H) 11.5 - 15.5 %   Platelets 301 150 - 400 K/uL  Magnesium     Status: None   Collection Time: 10/25/16  4:17 AM  Result Value Ref Range   Magnesium 1.8 1.7 - 2.4 mg/dL    Imaging / Studies: No results found.  Medications / Allergies: per chart  Antibiotics: Anti-infectives    Start     Dose/Rate Route Frequency Ordered Stop   10/25/16 1000  sulfamethoxazole-trimethoprim (BACTRIM DS,SEPTRA DS) 800-160 MG per tablet 2 tablet     2 tablet Oral Every 12 hours 10/24/16 1331 10/28/16 0959   10/24/16 2000  cefoTEtan (CEFOTAN) 2 g in dextrose 5 % 50 mL IVPB     2 g 100 mL/hr over 30 Minutes Intravenous Every 12 hours 10/24/16 1331 10/24/16 2323   10/24/16 0844  clindamycin (CLEOCIN) 900 mg, gentamicin (GARAMYCIN) 240 mg in sodium chloride 0.9  % 1,000 mL for intraperitoneal lavage  Status:  Discontinued       As needed 10/24/16 0844 10/24/16 1115   10/24/16 0800  gentamicin (GARAMYCIN) 400 mg in dextrose 5 % 100 mL IVPB     5 mg/kg  80.3 kg 220 mL/hr over 30 Minutes Intravenous  Once 10/24/16 0755 10/24/16 0855   10/24/16 0600  gentamicin (GARAMYCIN) 400 mg in dextrose  5 % 100 mL IVPB  Status:  Discontinued     5 mg/kg  80.3 kg 110 mL/hr over 60 Minutes Intravenous 60 min pre-op 10/23/16 1247 10/24/16 0721   10/24/16 0530  clindamycin (CLEOCIN) 900 mg, gentamicin (GARAMYCIN) 240 mg in sodium chloride 0.9 % 1,000 mL for intraperitoneal lavage  Status:  Discontinued      Intraperitoneal To Surgery 10/24/16 0526 10/24/16 1309   10/24/16 0526  cefoTEtan (CEFOTAN) 2 g in dextrose 5 % 50 mL IVPB     2 g 100 mL/hr over 30 Minutes Intravenous On call to O.R. 10/24/16 0526 10/24/16 0745   10/23/16 1247  clindamycin (CLEOCIN) IVPB 900 mg  Status:  Discontinued     900 mg 100 mL/hr over 30 Minutes Intravenous 60 min pre-op 10/23/16 1247 10/24/16 0721        Note: Portions of this report may have been transcribed using voice recognition software. Every effort was made to ensure accuracy; however, inadvertent computerized transcription errors may be present.   Any transcriptional errors that result from this process are unintentional.     Adin Hector, M.D., F.A.C.S. Gastrointestinal and Minimally Invasive Surgery [facility] [address], [place], [place] [address] [phone] Main / Paging   10/25/2016

## 2016-10-26 LAB — CBC
HCT: 24.6 % — ABNORMAL LOW (ref 39.0–52.0)
Hemoglobin: 7.7 g/dL — ABNORMAL LOW (ref 13.0–17.0)
MCH: 24.3 pg — AB (ref 26.0–34.0)
MCHC: 31.3 g/dL (ref 30.0–36.0)
MCV: 77.6 fL — ABNORMAL LOW (ref 78.0–100.0)
Platelets: 342 10*3/uL (ref 150–400)
RBC: 3.17 MIL/uL — ABNORMAL LOW (ref 4.22–5.81)
RDW: 19.5 % — AB (ref 11.5–15.5)
WBC: 12.8 10*3/uL — ABNORMAL HIGH (ref 4.0–10.5)

## 2016-10-26 LAB — POTASSIUM: Potassium: 4 mmol/L (ref 3.5–5.1)

## 2016-10-26 LAB — CREATININE, SERUM
CREATININE: 1.12 mg/dL (ref 0.61–1.24)
GFR calc Af Amer: >60 mL/min (ref >60)
GFR, EST NON AFRICAN AMERICAN: 57 mL/min — AB (ref >60)

## 2016-10-26 NOTE — Progress Notes (Signed)
RT placed patient on CPAP. Patient is tolerating well. 

## 2016-10-26 NOTE — Progress Notes (Signed)
Pharmacy Brief Note - Alvimopan (Entereg)  The standing order set for alvimopan (Entereg) now includes an automatic order to discontinue the drug after the patient has had a bowel movement. The change was approved by the [facility] and the Medical Executive Committee.   This patient has had bowel movements documented by nursing. Therefore, alvimopan has been discontinued. If there are questions, please contact the pharmacy at [phone].   Thank you-  Dia Sitter, PharmD, BCPS 10/26/2016 11:21 AM

## 2016-10-26 NOTE — Progress Notes (Signed)
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  Transfuse if worse or Sx.  Hold LMWH one day & follow -VTE prophylaxis- SCDs, etc -mobilize as tolerated to help recovery  I updated the patient's status to the patient and spouse.  Recommendations were made.  Questions were answered.  They expressed understanding & appreciation.   Adin Hector, M.D., F.A.C.S. Gastrointestinal and Minimally Invasive Surgery [facility] [address], [place], [place] [address] [phone] Main / Paging   10/26/2016  CARE TEAM:  PCP: Jerlyn Ly, MD  Outpatient Care Team: Patient Care Team: [name], MD as PCP - General (Internal Medicine) [name], MD as Consulting Physician (General Surgery) [name], MD as Consulting Physician (Gastroenterology) [name], MD as Consulting Physician (Cardiology)  Inpatient Treatment Team: Treatment Team: Attending Provider: [name], MD; Technician: Etheleen Sia, NT; Registered Nurse: Mertha Baars, RN; Technician: Macy Junita Push, NT  Subjective:  Feels well Had some nausea and bloating last night.  Not now.   Walking in hallway with his wife   Objective:  Vital signs:  Vitals:    10/25/16 1014 10/25/16 1432 10/25/16 2017 10/26/16 0543  BP: (!) 103/56 131/67 107/80 140/78  Pulse: 87 97 (!) 58 98  Resp: _0 Temp: 98.7 F (37.1 C) 98.1 F (36.7 C) 97.9 F (36.6 C) 98 F (36.7 C)  TempSrc: Oral Oral Oral Oral  SpO2: 98% 98% 98% 98%  Weight:      Height:        Last BM Date: 10/25/16  Intake/Output   Yesterday:  02/28 0701 - 03/01 0700 In: 390 [P.O.:390] Out: 330 [Urine:330] This shift:  No intake/output data recorded.  Bowel function:  Flatus: YES  BM:  No  Drain: (No drain)   Physical Exam:  General: Pt awake/alert/oriented x4 in No acute distress Eyes: PERRL, normal EOM.  Sclera clear.  No icterus Neuro: CN II-XII intact w/o focal sensory/motor deficits. Lymph: No head/neck/groin lymphadenopathy Psych:  No delerium/psychosis/paranoia HENT: Normocephalic, Mucus membranes moist.  No thrush Neck: Supple, No tracheal deviation Chest: No chest wall pain w good excursion CV:  Pulses intact.  Regular rhythm MS: Normal AROM mjr joints.  No obvious deformity Abdomen: Soft.  Nondistended.  Mildly tender at incisions only.  No evidence of peritonitis.  No incarcerated hernias.  No ecchymosis Ext:  SCDs BLE.  No mjr edema.  No cyanosis Skin: No petechiae / purpura  Results:   Labs: Results for orders placed or performed during the hospital encounter of 10/24/16 (from the past 48 hour(s))  Basic metabolic panel     Status: Abnormal   Collection Time: 10/25/16  4:17 AM  Result Value Ref Range  Sodium 139 135 - 145 mmol/L   Potassium 4.2 3.5 - 5.1 mmol/L   Chloride 107 101 - 111 mmol/L   CO2 26 22 - 32 mmol/L   Glucose, Bld 131 (H) 65 - 99 mg/dL   BUN 11 6 - 20 mg/dL   Creatinine, Ser 1.10 0.61 - 1.24 mg/dL   Calcium 8.6 (L) 8.9 - 10.3 mg/dL   GFR calc non Af Amer 58 (L) >60 mL/min   GFR calc Af Amer >60 >60 mL/min    Comment: (NOTE) The eGFR has been calculated using the CKD EPI equation. This calculation has not been validated in  all clinical situations. eGFR's persistently <60 mL/min signify possible Chronic Kidney Disease.    Anion gap 6 5 - 15  CBC     Status: Abnormal   Collection Time: 10/25/16  4:17 AM  Result Value Ref Range   WBC 13.6 (H) 4.0 - 10.5 K/uL   RBC 2.79 (L) 4.22 - 5.81 MIL/uL   Hemoglobin 6.8 (LL) 13.0 - 17.0 g/dL    Comment: REPEATED TO VERIFY CRITICAL RESULT CALLED TO, READ BACK BY AND VERIFIED WITH: PEROTTE, P RN 2.28.18 _0  ZANDO,C    HCT 21.9 (L) 39.0 - 52.0 %   MCV 78.5 78.0 - 100.0 fL   MCH 24.4 (L) 26.0 - 34.0 pg   MCHC 31.1 30.0 - 36.0 g/dL   RDW 19.2 (H) 11.5 - 15.5 %   Platelets 301 150 - 400 K/uL  Magnesium     Status: None   Collection Time: 10/25/16  4:17 AM  Result Value Ref Range   Magnesium 1.8 1.7 - 2.4 mg/dL  CBC     Status: Abnormal   Collection Time: 10/26/16  4:29 AM  Result Value Ref Range   WBC 12.8 (H) 4.0 - 10.5 K/uL   RBC 3.17 (L) 4.22 - 5.81 MIL/uL   Hemoglobin 7.7 (L) 13.0 - 17.0 g/dL   HCT 24.6 (L) 39.0 - 52.0 %   MCV 77.6 (L) 78.0 - 100.0 fL   MCH 24.3 (L) 26.0 - 34.0 pg   MCHC 31.3 30.0 - 36.0 g/dL   RDW 19.5 (H) 11.5 - 15.5 %   Platelets 342 150 - 400 K/uL  Creatinine, serum     Status: Abnormal   Collection Time: 10/26/16  4:29 AM  Result Value Ref Range   Creatinine, Ser 1.12 0.61 - 1.24 mg/dL   GFR calc non Af Amer 57 (L) >60 mL/min   GFR calc Af Amer >60 >60 mL/min    Comment: (NOTE) The eGFR has been calculated using the CKD EPI equation. This calculation has not been validated in all clinical situations. eGFR's persistently <60 mL/min signify possible Chronic Kidney Disease.   Potassium     Status: None   Collection Time: 10/26/16  4:29 AM  Result Value Ref Range   Potassium 4.0 3.5 - 5.1 mmol/L    Imaging / Studies: No results found.  Medications / Allergies: per chart  Antibiotics: Anti-infectives    Start     Dose/Rate Route Frequency Ordered Stop   10/25/16 1000  sulfamethoxazole-trimethoprim (BACTRIM DS,SEPTRA DS)  800-160 MG per tablet 2 tablet     2 tablet Oral Every 12 hours 10/24/16 1331 10/28/16 0959   10/24/16 2000  cefoTEtan (CEFOTAN) 2 g in dextrose 5 % 50 mL IVPB     2 g 100 mL/hr over 30 Minutes Intravenous Every 12 hours 10/24/16 1331 10/24/16 2323   10/24/16 0844  clindamycin (CLEOCIN)  900 mg, gentamicin (GARAMYCIN) 240 mg in sodium chloride 0.9 % 1,000 mL for intraperitoneal lavage  Status:  Discontinued       As needed 10/24/16 0844 10/24/16 1115   10/24/16 0800  gentamicin (GARAMYCIN) 400 mg in dextrose 5 % 100 mL IVPB     5 mg/kg  80.3 kg 220 mL/hr over 30 Minutes Intravenous  Once 10/24/16 0755 10/24/16 0855   10/24/16 0600  gentamicin (GARAMYCIN) 400 mg in dextrose 5 % 100 mL IVPB  Status:  Discontinued     5 mg/kg  80.3 kg 110 mL/hr over 60 Minutes Intravenous 60 min pre-op 10/23/16 1247 10/24/16 0721   10/24/16 0530  clindamycin (CLEOCIN) 900 mg, gentamicin (GARAMYCIN) 240 mg in sodium chloride 0.9 % 1,000 mL for intraperitoneal lavage  Status:  Discontinued      Intraperitoneal To Surgery 10/24/16 0526 10/24/16 1309   10/24/16 0526  cefoTEtan (CEFOTAN) 2 g in dextrose 5 % 50 mL IVPB     2 g 100 mL/hr over 30 Minutes Intravenous On call to O.R. 10/24/16 0526 10/24/16 0745   10/23/16 1247  clindamycin (CLEOCIN) IVPB 900 mg  Status:  Discontinued     900 mg 100 mL/hr over 30 Minutes Intravenous 60 min pre-op 10/23/16 1247 10/24/16 4944        Note: Portions of this report may have been transcribed using voice recognition software. Every effort was made to ensure accuracy; however, inadvertent computerized transcription errors may be present.   Any transcriptional errors that result from this process are unintentional.     Adin Hector, M.D., F.A.C.S. Gastrointestinal and Minimally Invasive Surgery [facility] [address], [place], [place] [address] [phone] Main / Paging   10/26/2016

## 2016-10-27 ENCOUNTER — Encounter (HOSPITAL_COMMUNITY): Payer: Self-pay | Admitting: Emergency Medicine

## 2016-10-27 ENCOUNTER — Inpatient Hospital Stay (HOSPITAL_COMMUNITY)
Admission: EM | Admit: 2016-10-27 | Discharge: 2016-11-01 | Disposition: A | Discharge status: Home / Self Care | Payer: Medicare Other | Source: Home / Self Care | Attending: Surgery | Admitting: Surgery

## 2016-10-27 ENCOUNTER — Emergency Department (HOSPITAL_COMMUNITY): Payer: Medicare Other

## 2016-10-27 DIAGNOSIS — K921 Melena: Secondary | ICD-10-CM | POA: Diagnosis not present

## 2016-10-27 DIAGNOSIS — H409 Unspecified glaucoma: Secondary | ICD-10-CM | POA: Diagnosis present

## 2016-10-27 DIAGNOSIS — Z87891 Personal history of nicotine dependence: Secondary | ICD-10-CM

## 2016-10-27 DIAGNOSIS — E785 Hyperlipidemia, unspecified: Secondary | ICD-10-CM | POA: Diagnosis present

## 2016-10-27 DIAGNOSIS — Z7901 Long term (current) use of anticoagulants: Secondary | ICD-10-CM

## 2016-10-27 DIAGNOSIS — D500 Iron deficiency anemia secondary to blood loss (chronic): Secondary | ICD-10-CM | POA: Diagnosis present

## 2016-10-27 DIAGNOSIS — Z952 Presence of prosthetic heart valve: Secondary | ICD-10-CM

## 2016-10-27 DIAGNOSIS — H3530 Unspecified macular degeneration: Secondary | ICD-10-CM | POA: Diagnosis present

## 2016-10-27 DIAGNOSIS — Z951 Presence of aortocoronary bypass graft: Secondary | ICD-10-CM

## 2016-10-27 DIAGNOSIS — Z8249 Family history of ischemic heart disease and other diseases of the circulatory system: Secondary | ICD-10-CM

## 2016-10-27 DIAGNOSIS — H9190 Unspecified hearing loss, unspecified ear: Secondary | ICD-10-CM | POA: Diagnosis present

## 2016-10-27 DIAGNOSIS — I2510 Atherosclerotic heart disease of native coronary artery without angina pectoris: Secondary | ICD-10-CM | POA: Diagnosis present

## 2016-10-27 DIAGNOSIS — F411 Generalized anxiety disorder: Secondary | ICD-10-CM | POA: Diagnosis present

## 2016-10-27 DIAGNOSIS — N200 Calculus of kidney: Secondary | ICD-10-CM | POA: Diagnosis present

## 2016-10-27 DIAGNOSIS — J449 Chronic obstructive pulmonary disease, unspecified: Secondary | ICD-10-CM | POA: Diagnosis present

## 2016-10-27 DIAGNOSIS — C180 Malignant neoplasm of cecum: Principal | ICD-10-CM | POA: Diagnosis present

## 2016-10-27 DIAGNOSIS — G4733 Obstructive sleep apnea (adult) (pediatric): Secondary | ICD-10-CM | POA: Diagnosis present

## 2016-10-27 DIAGNOSIS — E7800 Pure hypercholesterolemia, unspecified: Secondary | ICD-10-CM | POA: Diagnosis present

## 2016-10-27 DIAGNOSIS — K567 Ileus, unspecified: Secondary | ICD-10-CM | POA: Diagnosis not present

## 2016-10-27 DIAGNOSIS — D62 Acute posthemorrhagic anemia: Secondary | ICD-10-CM

## 2016-10-27 DIAGNOSIS — I493 Ventricular premature depolarization: Secondary | ICD-10-CM | POA: Diagnosis not present

## 2016-10-27 DIAGNOSIS — I10 Essential (primary) hypertension: Secondary | ICD-10-CM | POA: Diagnosis present

## 2016-10-27 DIAGNOSIS — K922 Gastrointestinal hemorrhage, unspecified: Secondary | ICD-10-CM | POA: Diagnosis present

## 2016-10-27 DIAGNOSIS — K219 Gastro-esophageal reflux disease without esophagitis: Secondary | ICD-10-CM | POA: Diagnosis present

## 2016-10-27 DIAGNOSIS — D649 Anemia, unspecified: Secondary | ICD-10-CM | POA: Diagnosis present

## 2016-10-27 DIAGNOSIS — Z8546 Personal history of malignant neoplasm of prostate: Secondary | ICD-10-CM

## 2016-10-27 DIAGNOSIS — I482 Chronic atrial fibrillation: Secondary | ICD-10-CM | POA: Diagnosis present

## 2016-10-27 LAB — POC OCCULT BLOOD, ED: FECAL OCCULT BLD: POSITIVE — AB

## 2016-10-27 LAB — COMPREHENSIVE METABOLIC PANEL
ALT: 17 U/L (ref 17–63)
AST: 26 U/L (ref 15–41)
Albumin: 3.1 g/dL — ABNORMAL LOW (ref 3.5–5.0)
Alkaline Phosphatase: 39 U/L (ref 38–126)
Anion gap: 9 (ref 5–15)
BUN: 17 mg/dL (ref 6–20)
CHLORIDE: 100 mmol/L — AB (ref 101–111)
CO2: 24 mmol/L (ref 22–32)
Calcium: 8.6 mg/dL — ABNORMAL LOW (ref 8.9–10.3)
Creatinine, Ser: 1.56 mg/dL — ABNORMAL HIGH (ref 0.61–1.24)
GFR, EST AFRICAN AMERICAN: 44 mL/min — AB (ref >60)
GFR, EST NON AFRICAN AMERICAN: 38 mL/min — AB (ref >60)
Glucose, Bld: 189 mg/dL — ABNORMAL HIGH (ref 65–99)
POTASSIUM: 3.6 mmol/L (ref 3.5–5.1)
SODIUM: 133 mmol/L — AB (ref 135–145)
Total Bilirubin: 0.6 mg/dL (ref 0.3–1.2)
Total Protein: 6 g/dL — ABNORMAL LOW (ref 6.5–8.1)

## 2016-10-27 LAB — PROTIME-INR
INR: 1.29
PROTHROMBIN TIME: 16.1 s — AB (ref 11.4–15.2)

## 2016-10-27 LAB — CBC WITH DIFFERENTIAL/PLATELET
Basophils Absolute: 0 10*3/uL (ref 0.0–0.1)
Basophils Relative: 0 %
EOS ABS: 0 10*3/uL (ref 0.0–0.7)
Eosinophils Relative: 0 %
HCT: 19.3 % — ABNORMAL LOW (ref 39.0–52.0)
HEMOGLOBIN: 6 g/dL — AB (ref 13.0–17.0)
LYMPHS ABS: 0.6 10*3/uL — AB (ref 0.7–4.0)
Lymphocytes Relative: 5 %
MCH: 24.5 pg — AB (ref 26.0–34.0)
MCHC: 31.1 g/dL (ref 30.0–36.0)
MCV: 78.8 fL (ref 78.0–100.0)
MONO ABS: 0.6 10*3/uL (ref 0.1–1.0)
MONOS PCT: 5 %
Neutro Abs: 10.2 10*3/uL — ABNORMAL HIGH (ref 1.7–7.7)
Neutrophils Relative %: 90 %
Platelets: 325 10*3/uL (ref 150–400)
RBC: 2.45 MIL/uL — ABNORMAL LOW (ref 4.22–5.81)
RDW: 19.8 % — AB (ref 11.5–15.5)
WBC: 11.4 10*3/uL — ABNORMAL HIGH (ref 4.0–10.5)

## 2016-10-27 LAB — I-STAT TROPONIN, ED: TROPONIN I, POC: 0.01 ng/mL (ref 0.00–0.08)

## 2016-10-27 LAB — PREPARE RBC (CROSSMATCH)

## 2016-10-27 MED ORDER — SODIUM CHLORIDE 0.9 % IV BOLUS (SEPSIS)
1000.0000 mL | Freq: Once | INTRAVENOUS | Status: AC
  Administered 2016-10-27: 1000 mL via INTRAVENOUS

## 2016-10-27 MED ORDER — IOPAMIDOL (ISOVUE-300) INJECTION 61%: 30.0000 mL | Freq: Once | INTRAVENOUS | Status: DC | PRN

## 2016-10-27 MED ORDER — PANTOPRAZOLE SODIUM 40 MG IV SOLR
40.0000 mg | Freq: Once | INTRAVENOUS | Status: AC
  Administered 2016-10-27: 40 mg via INTRAVENOUS
  Filled 2016-10-27: qty 40

## 2016-10-27 MED ORDER — PERPHENAZINE-AMITRIPTYLINE 4-25 MG PO TABS: 1.0000 | ORAL_TABLET | Freq: Every day | ORAL | Status: DC

## 2016-10-27 MED ORDER — METOPROLOL TARTRATE 12.5 MG HALF TABLET
12.5000 mg | ORAL_TABLET | Freq: Two times a day (BID) | ORAL | Status: DC
  Administered 2016-10-27: 12.5 mg via ORAL
  Filled 2016-10-27: qty 1

## 2016-10-27 MED ORDER — METOPROLOL TARTRATE 25 MG PO TABS: 25.0000 mg | ORAL_TABLET | Freq: Every day | ORAL | Status: DC

## 2016-10-27 MED ORDER — SODIUM CHLORIDE 0.9 % IV SOLN
INTRAVENOUS | Status: DC
  Administered 2016-10-27 – 2016-10-30 (×7): via INTRAVENOUS

## 2016-10-27 MED ORDER — PREDNISOLONE ACETATE 1 % OP SUSP
1.0000 [drp] | Freq: Every day | OPHTHALMIC | Status: DC
  Filled 2016-10-27: qty 1

## 2016-10-27 MED ORDER — IOPAMIDOL (ISOVUE-300) INJECTION 61%
INTRAVENOUS | Status: AC
  Filled 2016-10-27: qty 30

## 2016-10-27 MED ORDER — SODIUM CHLORIDE 0.9 % IV SOLN
10.0000 mL/h | Freq: Once | INTRAVENOUS | Status: AC
  Administered 2016-10-27: 10 mL/h via INTRAVENOUS

## 2016-10-27 MED ORDER — LORAZEPAM 2 MG/ML IJ SOLN
1.0000 mg | Freq: Once | INTRAMUSCULAR | Status: AC
  Administered 2016-10-27: 1 mg via INTRAVENOUS
  Filled 2016-10-27: qty 1

## 2016-10-27 MED ORDER — IOPAMIDOL (ISOVUE-300) INJECTION 61%
INTRAVENOUS | Status: AC
  Filled 2016-10-27: qty 100

## 2016-10-27 MED ORDER — IOPAMIDOL (ISOVUE-300) INJECTION 61%
75.0000 mL | Freq: Once | INTRAVENOUS | Status: AC | PRN
  Administered 2016-10-27: 75 mL via INTRAVENOUS

## 2016-10-27 NOTE — ED Notes (Signed)
Bed: [id number] Expected date:  Expected time:  Means of arrival:  Comments: TRIAGE 2

## 2016-10-27 NOTE — ED Notes (Signed)
Patient transported to CT 

## 2016-10-27 NOTE — Progress Notes (Signed)
Discharge instructions were given to patient and family all questions were answered and patient was taken to main entrance by wheelchair. [place]

## 2016-10-27 NOTE — ED Notes (Signed)
ED Provider at bedside. 

## 2016-10-27 NOTE — Discharge Instructions (Signed)
Restart warfarin blood thinner when you get home  Call your PCP (Dr Perini's group: [phone]) to have your PT/INR checked next week ~Wed/Thu  See Dr Johney Maine in 2-3 weeks   SURGERY: POST OP INSTRUCTIONS (Surgery for small bowel obstruction, colon resection, etc)   ######################################################################  EAT Gradually transition to a high fiber diet with a fiber supplement over the next few days after discharge  WALK Walk an hour a day.  Control your pain to do that.    CONTROL PAIN Control pain so that you can walk, sleep, tolerate sneezing/coughing, go up/down stairs.  HAVE A BOWEL MOVEMENT DAILY Keep your bowels regular to avoid problems.  OK to try a laxative to override constipation.  OK to use an antidairrheal to slow down diarrhea.  Call if not better after 2 tries  CALL IF YOU HAVE PROBLEMS/CONCERNS Call if you are still struggling despite following these instructions. Call if you have concerns not answered by these instructions  ######################################################################   DIET Follow a light diet the first few days at home.  Start with a bland diet such as soups, liquids, starchy foods, low fat foods, etc.  If you feel full, bloated, or constipated, stay on a ful liquid or pureed/blenderized diet for a few days until you feel better and no longer constipated. Be sure to drink plenty of fluids every day to avoid getting dehydrated (feeling dizzy, not urinating, etc.). Gradually add a fiber supplement to your diet over the next week.  Gradually get back to a regular solid diet.  Avoid fast food or heavy meals the first week as you are more likely to get nauseated. It is expected for your digestive tract to need a few months to get back to normal.  It is common for your bowel movements and stools to be irregular.  You will have occasional bloating and cramping that should eventually fade away.  Until you are eating  solid food normally, off all pain medications, and back to regular activities; your bowels will not be normal. Focus on eating a low-fat, high fiber diet the rest of your life (See Getting to [place], below).  CARE of your INCISION or WOUND It is good for closed incision and even open wounds to be washed every day.  Shower every day.  Short baths are fine.  Wash the incisions and wounds clean with soap & water.    If you have a closed incision(s), wash the incision with soap & water every day.  You may leave closed incisions open to air if it is dry.   You may cover the incision with clean gauze & replace it after your daily shower for comfort. If you have skin tapes (Steristrips) or skin glue (Dermabond) on your incision, leave them in place.  They will fall off on their own like a scab.  You may trim any edges that curl up with clean scissors.  If you have staples, set up an appointment for them to be removed in the office in 10 days after surgery.  If you have a drain, wash around the skin exit site with soap & water and place a new dressing of gauze or band aid around the skin every day.  Keep the drain site clean & dry.    If you have an open wound with packing, see wound care instructions.  In general, it is encouraged that you remove your dressing and packing, shower with soap & water, and replace your dressing once  a day.  Pack the wound with clean gauze moistened with normal (0.9%) saline to keep the wound moist & uninfected.  Pressure on the dressing for 30 minutes will stop most wound bleeding.  Eventually your body will heal & pull the open wound closed over the next few months.  Raw open wounds will occasionally bleed or secrete yellow drainage until it heals closed.  Drain sites will drain a little until the drain is removed.  Even closed incisions can have mild bleeding or drainage the first few days until the skin edges scab over & seal.   If you have an open wound with a wound  vac, see wound vac care instructions.     ACTIVITIES as tolerated Start light daily activities --- self-care, walking, climbing stairs-- beginning the day after surgery.  Gradually increase activities as tolerated.  Control your pain to be active.  Stop when you are tired.  Ideally, walk several times a day, eventually an hour a day.   Most people are back to most day-to-day activities in a few weeks.  It takes 4-8 weeks to get back to unrestricted, intense activity. If you can walk 30 minutes without difficulty, it is safe to try more intense activity such as jogging, treadmill, bicycling, low-impact aerobics, swimming, etc. Save the most intensive and strenuous activity for last (Usually 4-8 weeks after surgery) such as sit-ups, heavy lifting, contact sports, etc.  Refrain from any intense heavy lifting or straining until you are off narcotics for pain control.  You will have off days, but things should improve week-by-week. DO NOT PUSH THROUGH PAIN.  Let pain be your guide: If it hurts to do something, don't do it.  Pain is your body warning you to avoid that activity for another week until the pain goes down. You may drive when you are no longer taking narcotic prescription pain medication, you can comfortably wear a seatbelt, and you can safely make sudden turns/stops to protect yourself without hesitating due to pain. You may have sexual intercourse when it is comfortable. If it hurts to do something, stop.  MEDICATIONS Take your usually prescribed home medications unless otherwise directed.   Blood thinners:  Usually you can restart any strong blood thinners after the second postoperative day.  It is OK to take aspirin right away.     If you are on strong blood thinners (warfarin/Coumadin, Plavix, Xerelto, Eliquis, Pradaxa, etc), discuss with your surgeon, medicine PCP, and/or cardiologist for instructions on when to restart the blood thinner & if blood monitoring is needed (PT/INR blood  check, etc).     PAIN CONTROL Pain after surgery or related to activity is often due to strain/injury to muscle, tendon, nerves and/or incisions.  This pain is usually short-term and will improve in a few months.  To help speed the process of healing and to get back to regular activity more quickly, DO THE FOLLOWING THINGS TOGETHER: 1. Increase activity gradually.  DO NOT PUSH THROUGH PAIN 2. Use Ice and/or Heat 3. Try Gentle Massage and/or Stretching 4. Take over the counter pain medication 5. Take Narcotic prescription pain medication for more severe pain  Good pain control = faster recovery.  It is better to take more medicine to be more active than to stay in bed all day to avoid medications. 1.  Increase activity gradually Avoid heavy lifting at first, then increase to lifting as tolerated over the next 6 weeks. Do not push through the pain.  Listen to your  body and avoid positions and maneuvers than reproduce the pain.  Wait a few days before trying something more intense Walking an hour a day is encouraged to help your body recover faster and more safely.  Start slowly and stop when getting sore.  If you can walk 30 minutes without stopping or pain, you can try more intense activity (running, jogging, aerobics, cycling, swimming, treadmill, sex, sports, weightlifting, etc.) Remember: If it hurts to do it, then dont do it! 2. Use Ice and/or Heat You will have swelling and bruising around the incisions.  This will take several weeks to resolve. Ice packs or heating pads (6-8 times a day, 30-60 minutes at a time) will help sooth soreness & bruising. Some people prefer to use ice alone, heat alone, or alternate between ice & heat.  Experiment and see what works best for you.  Consider trying ice for the first few days to help decrease swelling and bruising; then, switch to heat to help relax sore spots and speed recovery. Shower every day.  Short baths are fine.  It feels good!  Keep the  incisions and wounds clean with soap & water.   3. Try Gentle Massage and/or Stretching Massage at the area of pain many times a day Stop if you feel pain - do not overdo it 4. Take over the counter pain medication This helps the muscle and nerve tissues become less irritable and calm down faster Choose ONE of the following over-the-counter anti-inflammatory medications: Acetaminophen 500mg  tabs (Tylenol) 1-2 pills with every meal and just before bedtime (avoid if you have liver problems or if you have acetaminophen in you narcotic prescription) Naproxen 220mg  tabs (ex. Aleve, Naprosyn) 1-2 pills twice a day (avoid if you have kidney, stomach, IBD, or bleeding problems) Ibuprofen 200mg  tabs (ex. Advil, Motrin) 3-4 pills with every meal and just before bedtime (avoid if you have kidney, stomach, IBD, or bleeding problems) Take with food/snack several times a day as directed for at least 2 weeks to help keep pain / soreness down & more manageable. 5. Take Narcotic prescription pain medication for more severe pain A prescription for strong pain control is often given to you upon discharge (for example: oxycodone/Percocet, hydrocodone/Norco/Vicodin, or tramadol/Ultram) Take your pain medication as prescribed. Be mindful that most narcotic prescriptions contain Tylenol (acetaminophen) as well - avoid taking too much Tylenol. If you are having problems/concerns with the prescription medicine (does not control pain, nausea, vomiting, rash, itching, etc.), please call us [phone] to see if we need to switch you to a different pain medicine that will work better for you and/or control your side effects better. If you need a refill on your pain medication, you must call the office before 4 pm and on weekdays only.  By federal law, prescriptions for narcotics cannot be called into a pharmacy.  They must be filled out on paper & picked up from our office by the patient or authorized caretaker.   Prescriptions cannot be filled after 4 pm nor on weekends.    WHEN TO CALL us [phone] Severe uncontrolled or worsening pain  Fever over 101 F (38.5 C) Concerns with the incision: Worsening pain, redness, rash/hives, swelling, bleeding, or drainage Reactions / problems with new medications (itching, rash, hives, nausea, etc.) Nausea and/or vomiting Difficulty urinating Difficulty breathing Worsening fatigue, dizziness, lightheadedness, blurred vision Other concerns If you are not getting better after two weeks or are noticing you are getting worse, contact our office [phone]  for further advice.  We may need to adjust your medications, re-evaluate you in the office, send you to the emergency room, or see what other things we can do to help. The clinic staff is available to answer your questions during regular business hours (8:30am-5pm).  Please dont hesitate to call and ask to speak to one of our nurses for clinical concerns.    A surgeon from [facility] is always on call at the hospitals 24 hours/day If you have a medical emergency, go to the nearest emergency room or call 911.  FOLLOW UP in our office One the day of your discharge from the hospital (or the next business weekday), please call [facility] to set up or confirm an appointment to see your surgeon in the office for a follow-up appointment.  Usually it is 2-3 weeks after your surgery.   If you have skin staples at your incision(s), let the office know so we can set up a time in the office for the nurse to remove them (usually around 10 days after surgery). Make sure that you call for appointments the day of discharge (or the next business weekday) from the hospital to ensure a convenient appointment time. IF YOU HAVE DISABILITY OR FAMILY LEAVE FORMS, BRING THEM TO THE OFFICE FOR PROCESSING.  DO NOT GIVE THEM TO YOUR DOCTOR.  [facility] [address], [place],  [place],   [address] ? [phone] - Main [phone] - [place],  [phone] - Fax [email or web address]  GETTING TO GOOD BOWEL HEALTH. It is expected for your digestive tract to need a few months to get back to normal.  It is common for your bowel movements and stools to be irregular.  You will have occasional bloating and cramping that should eventually fade away.  Until you are eating solid food normally, off all pain medications, and back to regular activities; your bowels will not be normal.   Avoiding constipation The goal: ONE SOFT BOWEL MOVEMENT A DAY!    Drink plenty of fluids.  Choose water first. TAKE A FIBER SUPPLEMENT EVERY DAY THE REST OF YOUR LIFE During your first week back home, gradually add back a fiber supplement every day Experiment which form you can tolerate.   There are many forms such as powders, tablets, wafers, gummies, etc Psyllium bran (Metamucil), methylcellulose (Citrucel), Miralax or Glycolax, Benefiber, Flax Seed.  Adjust the dose week-by-week (1/2 dose/day to 6 doses a day) until you are moving your bowels 1-2 times a day.  Cut back the dose or try a different fiber product if it is giving you problems such as diarrhea or bloating. Sometimes a laxative is needed to help jump-start bowels if constipated until the fiber supplement can help regulate your bowels.  If you are tolerating eating & you are farting, it is okay to try a gentle laxative such as double dose MiraLax, prune juice, or Milk of Magnesia.  Avoid using laxatives too often. Stool softeners can sometimes help counteract the constipating effects of narcotic pain medicines.  It can also cause diarrhea, so avoid using for too long. If you are still constipated despite taking fiber daily, eating solids, and a few doses of laxatives, call our office. Controlling diarrhea Try drinking liquids and eating bland foods for a few days to avoid stressing your intestines further. Avoid dairy  products (especially milk & ice cream) for a short time.  The intestines often can lose the ability to digest  lactose when stressed. Avoid foods that cause gassiness or bloating.  Typical foods include beans and other legumes, cabbage, broccoli, and dairy foods.  Avoid greasy, spicy, fast foods.  Every person has some sensitivity to other foods, so listen to your body and avoid those foods that trigger problems for you. Probiotics (such as active yogurt, Align, etc) may help repopulate the intestines and colon with normal bacteria and calm down a sensitive digestive tract Adding a fiber supplement gradually can help thicken stools by absorbing excess fluid and retrain the intestines to act more normally.  Slowly increase the dose over a few weeks.  Too much fiber too soon can backfire and cause cramping & bloating. It is okay to try and slow down diarrhea with a few doses of antidiarrheal medicines.   Bismuth subsalicylate (ex. Kayopectate, Pepto Bismol) for a few doses can help control diarrhea.  Avoid if pregnant.   Loperamide (Imodium) can slow down diarrhea.  Start with one tablet (2mg ) first.  Avoid if you are having fevers or severe pain.  ILEOSTOMY PATIENTS WILL HAVE CHRONIC DIARRHEA since their colon is not in use.    Drink plenty of liquids.  You will need to drink even more glasses of water/liquid a day to avoid getting dehydrated. Record output from your ileostomy.  Expect to empty the bag every 3-4 hours at first.  Most people with a permanent ileostomy empty their bag 4-6 times at the least.   Use antidiarrheal medicine (especially Imodium) several times a day to avoid getting dehydrated.  Start with a dose at bedtime & breakfast.  Adjust up or down as needed.  Increase antidiarrheal medications as directed to avoid emptying the bag more than 8 times a day (every 3 hours). Work with your wound ostomy nurse to learn care for your ostomy.  See ostomy care instructions. TROUBLESHOOTING  IRREGULAR BOWELS 1) Start with a soft & bland diet. No spicy, greasy, or fried foods.  2) Avoid gluten/wheat or dairy products from diet to see if symptoms improve. 3) Miralax 17gm or flax seed mixed in [place]. water or juice-daily. May use 2-4 times a day as needed. 4) Gas-X, Phazyme, etc. as needed for gas & bloating.  5) Prilosec (omeprazole) over-the-counter as needed 6)  Consider probiotics (Align, Activa, etc) to help calm the bowels down  Call your doctor if you are getting worse or not getting better.  Sometimes further testing (cultures, endoscopy, X-ray studies, CT scans, bloodwork, etc.) may be needed to help diagnose and treat the cause of the diarrhea. [facility], [place], [place], [place], [place]  [address] [phone] - Main.    [phone]  - Toll Free.   [phone] - Fax [email or web address]   Bleeding Precautions When on Anticoagulant Therapy WHAT IS ANTICOAGULANT THERAPY? Anticoagulant therapy is taking medicine to prevent or reduce blood clots. It is also called blood thinner therapy. Blood clots that form in your blood vessels can be dangerous. They can break loose and travel to your heart, lungs, or brain. This increases your risk of a heart attack or stroke. Anticoagulant therapy causes blood to clot more slowly. You may need anticoagulant therapy if you have:  A medical condition that increases the likelihood that blood clots will form.  A heart defect or a problem with heart rhythm. It is also a common treatment after heart surgery, such as valve replacement. WHAT ARE COMMON TYPES OF ANTICOAGULANT THERAPY? Anticoagulant medicine can be injected or taken by  mouth.If you need anticoagulant therapy quickly at the hospital, the medicine may be injected under your skin or given through an IV tube. Heparin is a common example of an anticoagulant that you may get at the hospital. Most anticoagulant therapy is in the form of  pills that you take at home every day. These may include:  Aspirin. This common blood thinner works by preventing blood cells (platelets) from sticking together to form a clot. Aspirin is not as strong as anticoagulants that slow down the time that it takes for your body to form a clot.  Clopidogrel. This is a newer type of drug that affects platelets. It is stronger than aspirin.  Warfarin. This is the most common anticoagulant. It changes the way your body uses vitamin K, a vitamin that helps your blood to clot. The risk of bleeding is higher with warfarin than with aspirin. You will need frequent blood tests to make sure you are taking the safest amount.  New anticoagulants. Several new drugs have been approved. They are all taken by mouth. Studies show that these drugs work as well as warfarin. They do not require blood testing. They may cause less bleeding risk than warfarin. WHAT DO I NEED TO REMEMBER WHEN TAKING ANTICOAGULANT THERAPY? Anticoagulant therapy decreases your risk of forming a blood clot, but it increases your risk of bleeding. Work closely with your health care provider to make sure you are taking your medicine safely. These tips can help:  Learn ways to reduce your risk of bleeding.  If you are taking warfarin:  Have blood tests as ordered by your health care provider.  Do not make any sudden changes to your diet. Vitamin K in your diet can make warfarin less effective.  Do not get pregnant. This medicine may cause birth defects.  Take your medicine at the same time every day. If you forget to take your medicine, take it as soon as you remember. If you miss a whole day, do not double your dose of medicine. Take your normal dose and call your health care provider to check in.  Do not stop taking your medicine on your own.  Tell your health care provider before you start taking any new medicine, vitamin, or herbal product. Some of these could interfere with your  therapy.  Tell all of your health care providers that you are on anticoagulant therapy.  Do not have surgery, medical procedures, or dental work until you tell your health care provider that you are on anticoagulant therapy. WHAT CAN AFFECT HOW ANTICOAGULANTS WORK? Certain foods, vitamins, medicines, supplements, and herbal medicines change the way that anticoagulant therapy works. They may increase or decrease the effects of your anticoagulant therapy. Either result can be dangerous for you.  Many over-the-counter medicines for pain, colds, or stomach problems interfere with anticoagulant therapy. Take these only as told by your health care provider.  Do not drink alcohol. It can interfere with your medicine and increase your risk of an injury that causes bleeding.  If you are taking warfarin, do not begin eating more foods that contain vitamin K. These include leafy green vegetables. Ask your health care provider if you should avoid any foods. WHAT ARE SOME WAYS TO PREVENT BLEEDING? You can prevent bleeding by taking certain precautions:  Be extra careful when you use knives, scissors, or other sharp objects.  Use an electric razor instead of a blade.  Do not use toothpicks.  Use a soft toothbrush.  Wear shoes that  have nonskid soles.  Use bath mats and handrails in your bathroom.  Wear gloves while you do yard work.  Wear a helmet when you ride a bike.  Wear your seat belt.  Prevent falls by removing loose rugs and extension cords from areas where you walk.  Do not play contact sports or participate in other activities that have a high risk of injury. [place] PROVIDER? Call your health care provider if:  You miss a dose of medicine:  And you are not sure what to do.  For more than one day.  You have:  Menstrual bleeding that is heavier than normal.  Blood in your urine.  A bloody nose or bleeding gums.  Easy bruising.  Blood in  your stool (feces) or have black and tarry stool.  Side effects from your medicine.  You feel weak or dizzy.  You become pregnant. Seek immediate medical care if:  You have bleeding that will not stop.  You have sudden and severe headache or belly pain.  You vomit or you cough up bright red blood.  You have a severe blow to your head. WHAT ARE SOME QUESTIONS TO ASK MY HEALTH CARE PROVIDER?  What is the best anticoagulant therapy for my condition?  What side effects should I watch for?  When should I take my medicine? What should I do if I forget to take it?  Will I need to have regular blood tests?  Do I need to change my diet? Are there foods or drinks that I should avoid?  What activities are safe for me?  What should I do if I want to get pregnant? This information is not intended to replace advice given to you by your health care provider. Make sure you discuss any questions you have with your health care provider. Document Released: 07/26/2015 Document Reviewed: 07/26/2015 Elsevier Interactive Patient Education  2017 [employer].

## 2016-10-27 NOTE — ED Triage Notes (Signed)
Pt complaint of gross amount of bright red rectal bleeding onset 2 hours ago. Recent colectomy this past Tuesday.

## 2016-10-27 NOTE — ED Notes (Signed)
Dr. Newman at bedside. 

## 2016-10-27 NOTE — ED Provider Notes (Signed)
[facility] DEPT Provider Note   CSN: [id number] Arrival date & time: 10/27/16  1550     History   Chief Complaint Chief Complaint  Patient presents with  . Rectal Bleeding    HPI Jason [name] is a 83 y.o. male.  HPI   83 year old male with hx of afib currently not on anticoagulant, diverticulosis, GERD, prostate CA, recent colectomy BIB family member from home for rectal bleeding.  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 Johney Maine.  He was discharged today and was doing well.  While he was in the hospital he was having regular bowel movements, and was able to ambulate around the hall way without difficulty.  Once he return home he started to drink some liquid but have not ate.  He then had the urge to have a bowel movement.  He report feeling weak, lightheadeness and sob on his way to the bathroom.  He then had a large BM with dark red blood approximately 2 hrs ago.  He felt better after BM.  When resting, he feels fine, but with exertion he feels weak.  He has not resume on his coumadin yet.  He currently denies any significant abd pain, rectal pain, active cp, sob, or headache.  Pt is hard of hearing.  Hx obtain through pt and wife who is at bedside. Last Hgb 7.7 yesterday.  Past Medical History:  Diagnosis Date  . Abnormal PSA   . Adenomatous polyp of colon 2010 and before 2006  . Anemia   . Aortic stenosis   . Aortic valve prosthesis present   . Atrial fibrillation ([place])   . CAD (coronary artery disease)   . DDD (degenerative disc disease), lumbar   . Depression   . Diverticulosis   . Dyslipidemia   . GERD (gastroesophageal reflux disease)   . Glaucoma   . History of blood transfusion 09/29/2016  . Hx of CABG   . Hypogonadism, male   . Macular degeneration   . OSA (obstructive sleep apnea)   . Osteoarthritis   . Osteopenia   . Prostate CA [facility]) prostate 2007   colon dx 2018, watching psa levels, no tx yet for prostate    Patient  Active Problem List   Diagnosis Date Noted  . HOH (hard of hearing) 10/24/2016  . Cecal cancer s/p robotic proximal colectomy 10/24/2016 09/25/2016  . RLQ abdominal pain 08/29/2016  . Abnormal abdominal CT scan 08/29/2016  . Chronic anticoagulation 08/29/2016  . Symptomatic anemia 08/22/2016  . Iron deficiency anemia due to chronic blood loss 06/29/2016  . Melena   . Angiodysplasia of stomach and duodenum without bleeding   . GIB (gastrointestinal bleeding) 09/14/2014  . Atrial fibrillation ([place]) 07/15/2014  . PVC's (premature ventricular contractions) 12/05/2012  . Fatigue 12/12/2011  . Heart palpitations 01/24/2011  . CAD (coronary artery disease)   . Aortic stenosis   . Dyslipidemia   . Depression   . Anxiety state 11/06/2008  . Aortic valve disorder 11/06/2008  . CONSTIPATION 11/06/2008  . IDIOPATHIC OSTEOPOROSIS 11/06/2008  . OSA (obstructive sleep apnea) 11/06/2008  . HEMORRHOIDS 11/05/2008  . DIVERTICULOSIS, COLON 11/05/2008  . Personal history of colonic polyps 11/05/2008    Past Surgical History:  Procedure Laterality Date  . AORTIC VALVE REPLACEMENT  October 2011   Magna Ease pericardial tissue valve #55mm  . [place]   lower  . CATARACT EXTRACTION Bilateral   . CORNEAL TRANSPLANT Right   .  CORONARY ARTERY BYPASS GRAFT  October 2011   LIMA to LAD  . ESOPHAGOGASTRODUODENOSCOPY N/A 09/16/2014   Procedure: ESOPHAGOGASTRODUODENOSCOPY (EGD);  Surgeon: [name], MD;  Location: [facility] ENDOSCOPY;  Service: Endoscopy;  Laterality: N/A;  . TONSILLECTOMY  76 yrs ago       Home Medications    Prior to Admission medications   Medication Sig Start Date End Date Taking? Authorizing Provider  acetaminophen (TYLENOL) 500 MG tablet Take 500 mg by mouth every 6 (six) hours as needed for moderate pain.     Historical Provider, MD  Calcium Carbonate-Vitamin D (CALCIUM-VITAMIN D) 500-200 MG-UNIT per tablet Take 1 tablet by mouth daily.    Historical Provider, MD    Cholecalciferol 1000 UNITS capsule Take 1,000 Units by mouth daily.     Historical Provider, MD  dorzolamide (TRUSOPT) 2 % ophthalmic solution PLACE 1 DROP INTO BOTH EYES 3 TIMES DAILY. 11/03/15   Historical Provider, MD  fluticasone (FLONASE) 50 MCG/ACT nasal spray Place 1 spray into both nostrils daily as needed for allergies or rhinitis.     Historical Provider, MD  glucosamine-chondroitin 500-400 MG tablet Take 1 tablet by mouth 2 (two) times daily.    Historical Provider, MD  latanoprost (XALATAN) 0.005 % ophthalmic solution Place 1 drop into both eyes daily. 05/16/15   Historical Provider, MD  metoprolol tartrate (LOPRESSOR) 25 MG tablet Take 1 tablet (25 mg total) by mouth daily. Patient taking differently: Take 25 mg by mouth at bedtime.  09/12/16   [name], MD  Multiple Vitamin (MULTIVITAMIN WITH MINERALS) TABS tablet Take 1 tablet by mouth daily.    Historical Provider, MD  NON FORMULARY Inhale 1 application into the lungs at bedtime. CPAP for apnea    Historical Provider, MD  omega-3 acid ethyl esters (LOVAZA) 1 G capsule Take 1 g by mouth daily.     Historical Provider, MD  perphenazine-amitriptyline (ETRAFON/TRIAVIL) 4-25 MG TABS Take 1 tablet by mouth at bedtime.    Historical Provider, MD  prednisoLONE acetate (PRED FORTE) 1 % ophthalmic suspension Place 1 drop into the right eye daily at 12 noon.     Historical Provider, MD  ranitidine (ZANTAC) 150 MG tablet Take 150 mg by mouth daily.    Historical Provider, MD  rosuvastatin (CRESTOR) 5 MG tablet Take 5 mg by mouth at bedtime.     Historical Provider, MD  senna-docusate (SENOKOT-S) 8.6-50 MG per tablet Take 1 tablet by mouth at bedtime.     Historical Provider, MD  traMADol (ULTRAM) 50 MG tablet Take 1-2 tablets (50-100 mg total) by mouth every 6 (six) hours as needed for moderate pain or severe pain. 10/24/16   [name], MD  vitamin B-12 (CYANOCOBALAMIN) 1000 MCG tablet Take 1,000 mcg by mouth daily.    Historical Provider,  MD  warfarin (COUMADIN) 5 MG tablet Take 2.5-5 mg by mouth daily at 6 PM. Takes 1 tablet(5mg ) daily except 1/2 tablet(2.5mg ) on Mondays and Thursdays.    Historical Provider, MD    Family History Family History  Problem Relation Age of Onset  . Heart attack Father   . Colon cancer Neg Hx     Social History Social History  Substance Use Topics  . Smoking status: Former Smoker    Packs/day: 1.00    Years: 40.00    Types: Cigarettes    Quit date: 11/16/1990  . Smokeless tobacco: Never Used  . Alcohol use 1.2 oz/week    2 Glasses of wine per week  Comment: wine few x per week     Allergies   Patient has no known allergies.   Review of Systems Review of Systems  All other systems reviewed and are negative.    Physical Exam Updated Vital Signs BP (!) 85/67 (BP Location: Right Arm)   Pulse (!) 133   Resp 18   Wt 86.2 kg   SpO2 100%   BMI 26.50 kg/m   Physical Exam  Constitutional: He appears well-developed and well-nourished. No distress.  Elderly male, resting in bed in NAD  HENT:  Head: Atraumatic.  Eyes: EOM are normal. Pupils are equal, round, and reactive to light.  Neck: Neck supple.  Cardiovascular:  Tachycardic without M/R/G  Pulmonary/Chest: He has rales (faint rales at lung base).  Abdominal: Soft.  Decrease bowel sounds, abdomen with mild tenderness at recent laparoscopic surgical site.  The site is well appearing without signs of infection.   Genitourinary:  Genitourinary Comments: Chaperone present during exam.  Normal rectal tone, moderate amount of maroon color stool noted on glove.   Neurological: He is alert.  Skin: No rash noted. There is pallor.  Psychiatric: He has a normal mood and affect.  Nursing note and vitals reviewed.    ED Treatments / Results  Labs (all labs ordered are listed, but only abnormal results are displayed) Labs Reviewed  COMPREHENSIVE METABOLIC PANEL - Abnormal; Notable for the following:       Result Value    Sodium 133 (*)    Chloride 100 (*)    Glucose, Bld 189 (*)    Creatinine, Ser 1.56 (*)    Calcium 8.6 (*)    Total Protein 6.0 (*)    Albumin 3.1 (*)    GFR calc non Af Amer 38 (*)    GFR calc Af Amer 44 (*)    All other components within normal limits  PROTIME-INR - Abnormal; Notable for the following:    Prothrombin Time 16.1 (*)    All other components within normal limits  CBC WITH DIFFERENTIAL/PLATELET - Abnormal; Notable for the following:    WBC 11.4 (*)    RBC 2.45 (*)    Hemoglobin 6.0 (*)    HCT 19.3 (*)    MCH 24.5 (*)    RDW 19.8 (*)    Neutro Abs 10.2 (*)    Lymphs Abs 0.6 (*)    All other components within normal limits  POC OCCULT BLOOD, ED - Abnormal; Notable for the following:    Fecal Occult Bld POSITIVE (*)    All other components within normal limits  I-STAT TROPOININ, ED  TYPE AND SCREEN  PREPARE RBC (CROSSMATCH)    EKG  EKG Interpretation  Date/Time:  Friday October 27 2016 16:32:51 EST Ventricular Rate:  116 PR Interval:    QRS Duration: 146 QT Interval:  410 QTC Calculation: 570 R Axis:   -64 Text Interpretation:  Atrial fibrillation Ventricular premature complex Nonspecific IVCD with LAD LVH with secondary repolarization abnormality Lateral leads are also involved Probable RV involvement, suggest recording right precordial leads new st depression in v2, no other leads involved Confirmed by FLOYD MD, DANIEL [phone]) on 10/27/2016 4:48:16 PM       Radiology Ct Abdomen Pelvis W Contrast  Result Date: 10/27/2016 CLINICAL DATA:  GI bleed. Recent abdominal surgery. Patient underwent robotic proximal right colectomy for cecal cancer 3 days prior. EXAM: CT ABDOMEN AND PELVIS WITH CONTRAST TECHNIQUE: Multidetector CT imaging of the abdomen and pelvis was performed using the  standard protocol following bolus administration of intravenous contrast. CONTRAST:  1mL ISOVUE-300 IOPAMIDOL (ISOVUE-300) INJECTION 61% COMPARISON:  CT 08/08/2016 FINDINGS: Lower chest:  Stable cardiomegaly, mitral annulus and coronary artery calcifications. Minimal dependent atelectasis, right greater than left. Trace right pleural effusion. Hepatobiliary: No focal hepatic lesion. Gallbladder is distended without calcified stone. No biliary dilatation. Small amount perihepatic fluid. Pancreas: No ductal dilatation or inflammation. Spleen: Normal in size without focal abnormality. Adrenals/Urinary Tract: No adrenal nodule. Nonobstructing left renal stone. No hydronephrosis or perinephric edema. Bilateral renal parenchymal thinning. The ureters are decompressed. Urinary bladder is physiologically distended, no wall thickening. Stomach/Bowel: Patient is post right hemicolectomy 3 days prior, enteric sutures noted at the ileal colonic anastomosis. No perianal stenotic wall thickening or inflammation. Small hiatal hernia, stomach is distended with ingested contrast. No enteric contrast distal to the stomach. Proximal small bowel is decompressed, the distal small bowel are prominent fluid-filled. Colon is diffusely fluid-filled without wall thickening. Diverticula are seen involving the descending and sigmoid colon. Sigmoid colon is tortuous and courses in the right abdomen. No evidence of obstruction. Scattered free air in the abdomen likely sequela of recent abdominal surgery. Trace free fluid in the pericolic gutters, about the liver, and presacral pelvis. No loculated abscess. Vascular/Lymphatic: Aortic atherosclerosis without aneurysm. No evidence of adenopathy. Reproductive: No focal prostatic abnormality. Other: Postsurgical change in the anterior abdominal wall with scattered air and edema. No confluent subcutaneous hematoma. Minimal air and edema track in the bilateral flank soft tissues. Edematous changes track into the right inguinal canal. Musculoskeletal: Stable degenerative change in the lumbar spine without acute abnormality. IMPRESSION: 1. No abnormality to explain GI bleed. Post recent  right hemicolectomy without apparent complication. Distal small bowel is fluid-filled and prominent, with fluid throughout the entire colon, however no bowel wall thickening or inflammation. Small volume of free air and free fluid in the abdomen and pelvis, not unexpected post recent abdominal surgery. 2. Distal colonic diverticulosis without diverticulitis. 3. Incidental findings of nonobstructing left renal stone and intra-abdominal atherosclerosis. Electronically Signed   By: Jeb Levering M.D.   On: 10/27/2016 20:31    Procedures Procedures (including critical care time)  Medications Ordered in ED Medications - No data to display   Initial Impression / Assessment and Plan / ED Course  I have reviewed the triage vital signs and the nursing notes.  Pertinent labs & imaging results that were available during my care of the patient were reviewed by me and considered in my medical decision making (see chart for details).     BP 95/78   Pulse 119   Temp 98.2 F (36.8 C) (Oral)   Resp 19   Wt 86.2 kg   SpO2 99%   BMI 26.50 kg/m    Final Clinical Impressions(s) / ED Diagnoses   Final diagnoses:  Acute GI bleeding    New Prescriptions New Prescriptions   No medications on file   4:37 PM Pt with recent robotic laparoscopic proximal colectomy a week ago here with new onset of rectal bleeding.  Does have maroon color stool on rectal exam.  Has a benign abdominal exam.  Labs recmarkable for a drop of hemoglobin of 7.7 to 6.0 since yesterday.  Fecal occult blood test positive.  Will initiate blood transfusion.  Plan to obtain abd/pelvis CT scan, will consult surgery and plan to admit to medicine for symptomatic anemia.  Care discussed with Dr. Tyrone Nine  Time were also spent discussion option of palliative care consultation.  Pt and  family members show appreciation but currently not interested at this time.    9:06 PM CT scan of abd/pelvis without acute finding to explain pt's GI  bleeding.  Appreciate consultation from on call surgeon Dr. Lucia Gaskins who agrees to see pt in the ER and will determine disposition.   9:39 PM While receiving blood product pt became nauseous, vomiting up coffee ground emesis. Became diaphoretic and tachycardic.  He has since improved.  Ativan given for nausea. Does have prolonged QT on EKG.  I did discussed with Dr. Tyrone Nine who evaluated pt. Will monitor closely, pt may need NG tube if n/v persists.    CRITICAL CARE Performed by: Domenic Moras Total critical care time: 45 minutes Critical care time was exclusive of separately billable procedures and treating other patients. Critical care was necessary to treat or prevent imminent or life-threatening deterioration. Critical care was time spent personally by me on the following activities: development of treatment plan with patient and/or surrogate as well as nursing, discussions with consultants, evaluation of patient's response to treatment, examination of patient, obtaining history from patient or surrogate, ordering and performing treatments and interventions, ordering and review of laboratory studies, ordering and review of radiographic studies, pulse oximetry and re-evaluation of patient's condition.    Domenic Moras, PA-C 10/27/16 [place], DO 10/27/16 2323

## 2016-10-27 NOTE — H&P (Signed)
Re:   Jason [name] DOB:   16-Mar-1926 MRN:   [id number]   Jason [name] Admission  ASSESSMENT AND PLAN: 1.  GI bleed - presumably from the anastomosis  Hgb - 6.0 - 10/27/2016  PT/INR - 16.1/1.29 - 10/27/2016  Plan:  Step down ICU, transfuse, follow H/H  2.  Cecal cancer   Final path not back  s/p robotic proximal colectomy 10/24/2016 - [place]  Hospitalized at [facility] from 10/24/2016 - 10/27/2016 3.  Anxiety state 4.  OSA (obstructive sleep apnea)  On CPAP 5.  Chronic anticoagulation  He has not restarted his coumadin since surgery 6.   HOH (hard of hearing) 7.   Renal insufficiency  Creatinine - 1.56 - 10/27/2016  8.  Aortic valve replacement surgery and CABG by [name] in 2011 9  History of atrial fibrillation  Followed by Dr. P Martinique  10.  Prostate cancer followed by Dr. Ellwood Sayers 11. Left kidney stone on CT  Chief Complaint  Patient presents with  . Rectal Bleeding   PHYSICIAN REQUESTING CONSULTATION:  Domenic Moras, PA, Karenann Cai  HISTORY OF PRESENT ILLNESS: Jason [name] is a 83 y.o. (DOB: Sep 01, 1925)  white male whose primary care physician is Jerlyn Ly, MD.    His wife, Avanell Shackleton, and daughter in law, Tillie Fantasia, are at the bedside.  He was discharged this AM after a right hemicolectomy for cecal cancer on 10/24/2016 by Dr. Johney Maine.  He experienced blood per rectum at home today and returned the [facility] after being home about 3 hours. His hospital course post colectomy had gone well.  Though he was anemic post op.  His pre op Hgb was only 8.5.  Post op he was 6.8.  He also has had some nausea and vomited just before I came in the ER.  He feels better post emesis.    Past Medical History:  Diagnosis Date  . Abnormal PSA   . Adenomatous polyp of colon 2010 and before 2006  . Anemia   . Aortic stenosis   . Aortic valve prosthesis present   . Atrial fibrillation ([place])   . CAD (coronary artery disease)   . DDD (degenerative disc disease), lumbar   . Depression    . Diverticulosis   . Dyslipidemia   . GERD (gastroesophageal reflux disease)   . Glaucoma   . History of blood transfusion 09/29/2016  . Hx of CABG   . Hypogonadism, male   . Macular degeneration   . OSA (obstructive sleep apnea)   . Osteoarthritis   . Osteopenia   . Prostate CA [facility]) prostate 2007   colon dx 2018, watching psa levels, no tx yet for prostate      Past Surgical History:  Procedure Laterality Date  . AORTIC VALVE REPLACEMENT  October 2011   Magna Ease pericardial tissue valve #57mm  . [place]   lower  . CATARACT EXTRACTION Bilateral   . CORNEAL TRANSPLANT Right   . CORONARY ARTERY BYPASS GRAFT  October 2011   LIMA to LAD  . ESOPHAGOGASTRODUODENOSCOPY N/A 09/16/2014   Procedure: ESOPHAGOGASTRODUODENOSCOPY (EGD);  Surgeon: [name], MD;  Location: [facility] ENDOSCOPY;  Service: Endoscopy;  Laterality: N/A;  . TONSILLECTOMY  76 yrs ago      Current Facility-Administered Medications  Medication Dose Route Frequency Provider Last Rate Last Dose  . 0.9 %  sodium chloride infusion  10 mL/hr Intravenous Once Domenic Moras, PA-C      . 0.9 %  sodium chloride infusion   Intravenous Continuous Domenic Moras, PA-C 125 mL/hr at 10/27/16 1833    . iopamidol (ISOVUE-300) 61 % injection 30 mL  30 mL Oral Once PRN Deno Etienne, DO      . iopamidol (ISOVUE-300) 61 % injection           . iopamidol (ISOVUE-300) 61 % injection            Current Outpatient Prescriptions  Medication Sig Dispense Refill  . dorzolamide (TRUSOPT) 2 % ophthalmic solution PLACE 1 DROP INTO BOTH EYES 3 TIMES DAILY.  11  . latanoprost (XALATAN) 0.005 % ophthalmic solution Place 1 drop into both eyes daily.  11  . acetaminophen (TYLENOL) 500 MG tablet Take 500 mg by mouth every 6 (six) hours as needed for moderate pain.     . Calcium Carbonate-Vitamin D (CALCIUM-VITAMIN D) 500-200 MG-UNIT per tablet Take 1 tablet by mouth daily.    . Cholecalciferol 1000 UNITS capsule Take 1,000 Units by mouth daily.      . fluticasone (FLONASE) 50 MCG/ACT nasal spray Place 1 spray into both nostrils daily as needed for allergies or rhinitis.     [name] glucosamine-chondroitin 500-400 MG tablet Take 1 tablet by mouth 2 (two) times daily.    . metoprolol tartrate (LOPRESSOR) 25 MG tablet Take 1 tablet (25 mg total) by mouth daily. (Patient taking differently: Take 25 mg by mouth at bedtime. ) 90 tablet 1  . Multiple Vitamin (MULTIVITAMIN WITH MINERALS) TABS tablet Take 1 tablet by mouth daily.    . NON FORMULARY Inhale 1 application into the lungs at bedtime. CPAP for apnea    . omega-3 acid ethyl esters (LOVAZA) 1 G capsule Take 1 g by mouth daily.     [name] perphenazine-amitriptyline (ETRAFON/TRIAVIL) 4-25 MG TABS Take 1 tablet by mouth at bedtime.    . prednisoLONE acetate (PRED FORTE) 1 % ophthalmic suspension Place 1 drop into the right eye daily at 12 noon.     . ranitidine (ZANTAC) 150 MG tablet Take 150 mg by mouth daily.    . rosuvastatin (CRESTOR) 5 MG tablet Take 5 mg by mouth at bedtime.     . senna-docusate (SENOKOT-S) 8.6-50 MG per tablet Take 1 tablet by mouth at bedtime.     . traMADol (ULTRAM) 50 MG tablet Take 1-2 tablets (50-100 mg total) by mouth every 6 (six) hours as needed for moderate pain or severe pain. 30 tablet 0  . vitamin B-12 (CYANOCOBALAMIN) 1000 MCG tablet Take 1,000 mcg by mouth daily.    [name] warfarin (COUMADIN) 5 MG tablet Take 2.5-5 mg by mouth daily at 6 PM. Takes 1 tablet(5mg ) daily except 1/2 tablet(2.5mg ) on Mondays and Thursdays.       No Known Allergies  REVIEW OF SYSTEMS: Skin:  No history of rash.  No history of abnormal moles. Infection:  No history of hepatitis or HIV.  No history of MRSA. Neurologic:  No history of stroke.  No history of seizure.  No history of headaches.  Poor hearing. Cardiac:  Aortic valve replacement surgery and CABG by Dr. Servando Snare in 2011.  History of atrial fibrillation -  Followed by Dr. P Martinique Pulmonary:  OSA - on CPAP  Endocrine:  No  diabetes. No thyroid disease. Gastrointestinal:  See HPI Urologic:  Prostate Ca, followed by [name] Musculoskeletal:  No history of joint or back disease. Hematologic:  Was anticoagulated prior to surgery, but had not restarted Coumadin. Psycho-social:  The patient is oriented.  The patient has no obvious psychologic or social impairment to understanding our conversation and plan.  SOCIAL and FAMILY HISTORY: Lives at [facility]. His wife, Avanell Shackleton (2nd wife), and daughter in law, Tillie Fantasia, are at the bedside. He has 2 sons.   PHYSICAL EXAM: BP 118/80   Pulse 92   Temp 98.2 F (36.8 C) (Oral)   Resp 14   Wt 86.2 kg (190 lb)   SpO2 100%   BMI 26.50 kg/m   General: WN older WM who is alert.  He looks okay, but does not feel good. Skin:  Inspection and palpation - no mass or rash. Eyes:  Conjunctiva and lids unremarkable.            Pupils are equal Ears, Nose, Mouth, and Throat:  Ears and nose unremarkable            Lips and teeth are unremarable. Neck: Supple. No mass, trachea midline.  No thyroid mass. Lymph Nodes:  No supraclavicular, cervical, or inguinal nodes. Lungs: Normal respiratory effort.  Clear to auscultation and symmetric breath sounds. Heart:  Palpation of the heart is normal.            Auscultation: Irregular heart rate.  3/6 systolic murmur.  Abdomen: No mass.  No hernia.  Distended.            Some bowel sounds.  Lower pfannenstiel incision Rectal: Not done. Musculoskeletal:                        Okay muscle strength and ROM  in upper and lower extremities. Neurologic:  Grossly intact to motor and sensory function. Psychiatric: Behavior is normal.            Oriented to time, person, place.   DATA REVIEWED, COUNSELING AND COORDINATION OF CARE: Epic notes reviewed. Counseling and coordination of care exceeded more than 50% of the time spent with patient. Total time spent with patient and charting: 45 minutes  Alphonsa Overall,  MD,  [facility], [address]  [place], [place]    [place] Phone:  [phone] FAX:  [phone]

## 2016-10-27 NOTE — ED Notes (Signed)
Pt. Vomited about 200cc of reddish emesis , denied chest pain ,denied SOB . PA called to room . Dr. Tyrone Nine at bedside. BT rate changed to 55ml x35mins. Pt. Kept monitored. Pt. Stated that he felt a little better after vomiting.

## 2016-10-27 NOTE — Discharge Summary (Signed)
Physician Discharge Summary  Patient ID: Jason [name] MRN: [id number] DOB/AGE: 08/30/1925  83 y.o.  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  Postoperatively, the patient gradually mobilized and advanced to a solid diet.  Pain and other symptoms were treated aggressively.    By the time of discharge, the patient was walking well the hallways, eating food, having flatus.  Pain was well-controlled on an oral medications.  Based on meeting discharge criteria and continuing to recover, I felt it was safe for the patient to be discharged from the hospital to further recover with close followup. Postoperative recommendations were discussed in detail with patient & his son.  They are written as well.  Discharged Condition: good  Disposition:  Follow-up Information    , C., MD. Schedule an appointment as soon as possible for a visit in 2 week(s).   Specialty:  General Surgery Why:  To follow up after your operation, To follow up after your hospital stay Contact information: [address] [place] [address] [phone]        PERINI,MARK A, MD Follow up on 11/01/2016.   Specialty:  Internal  Medicine Why:  To have your PT/INR checked Contact information: [address] [place] [address] [phone]           01-Home or Self Care  Discharge Instructions    Call MD for:    Complete by:  As directed    FEVER > 101.5 F  (temperatures < 101.5 F are not significant)   Call MD for:    Complete by:  As directed    FEVER > 101.5 F  (temperatures < 101.5 F are not significant)   Call MD for:  extreme fatigue    Complete by:  As directed    Call MD for:  extreme fatigue    Complete by:  As directed    Call MD for:  persistant dizziness or light-headedness    Complete by:  As directed    Call MD for:  persistant dizziness or light-headedness    Complete by:  As directed    Call MD for:  persistant nausea and vomiting    Complete by:  As directed    Call MD for:  persistant nausea and vomiting    Complete by:  As directed    Call MD for:  redness, tenderness, or signs of infection (pain, swelling, redness, odor or green/yellow discharge around incision site)    Complete by:  As directed    Call MD for:  redness, tenderness, or signs of infection (pain, swelling, redness, odor or green/yellow discharge around incision site)    Complete by:  As directed    Call MD for:  severe uncontrolled   pain    Complete by:  As directed    Call MD for:  severe uncontrolled pain    Complete by:  As directed    Diet - low sodium heart healthy    Complete by:  As directed    Follow a light diet the first few days at home.   Start with a bland diet such as soups, liquids, starchy foods, low fat foods, etc.   If you feel full, bloated, or constipated, stay on a full liquid or pureed/blenderized diet for a few days until you feel better and no longer constipated. Be sure to drink plenty of fluids every day to avoid getting dehydrated (feeling dizzy, not urinating, etc.). Gradually add a fiber supplement to your diet   Diet - low sodium heart healthy    Complete by:  As directed     Follow a light diet the first few days at home.   Start with a bland diet such as soups, liquids, starchy foods, low fat foods, etc.   If you feel full, bloated, or constipated, stay on a full liquid or pureed/blenderized diet for a few days until you feel better and no longer constipated. Be sure to drink plenty of fluids every day to avoid getting dehydrated (feeling dizzy, not urinating, etc.). Gradually add a fiber supplement to your diet   Discharge instructions    Complete by:  As directed    See Discharge Instructions If you are not getting better after two weeks or are noticing you are getting worse, contact our office [phone] for further advice.  We may need to adjust your medications, re-evaluate you in the office, send you to the emergency room, or see what other things we can do to help. The clinic staff is available to answer your questions during regular business hours (8:30am-5pm).  Please don't hesitate to call and ask to speak to one of our nurses for clinical concerns.    A surgeon from [facility] is always on call at the hospitals 24 hours/day If you have a medical emergency, go to the nearest emergency room or call 911.   Discharge instructions    Complete by:  As directed    See Discharge Instructions If you are not getting better after two weeks or are noticing you are getting worse, contact our office [phone] for further advice.  We may need to adjust your medications, re-evaluate you in the office, send you to the emergency room, or see what other things we can do to help. The clinic staff is available to answer your questions during regular business hours (8:30am-5pm).  Please don't hesitate to call and ask to speak to one of our nurses for clinical concerns.    A surgeon from [facility] is always on call at the hospitals 24 hours/day If you have a medical emergency, go to the nearest emergency room or call 911.   Driving  Restrictions    Complete by:  As directed    You may drive when you are no longer taking narcotic prescription pain medication, you can comfortably wear a seatbelt, and you can safely make sudden turns/stops to protect yourself without hesitating due to pain.   Driving Restrictions    Complete by:  As directed    You may drive when you are no longer taking narcotic prescription pain medication, you can comfortably wear a seatbelt, and you can safely make sudden turns/stops to protect yourself without hesitating due   to pain.   Increase activity slowly    Complete by:  As directed    Start light daily activities --- self-care, walking, climbing stairs- beginning the day after surgery.  Gradually increase activities as tolerated.  Control your pain to be active.  Stop when you are tired.  Ideally, walk several times a day, eventually an hour a day.   Most people are back to most day-to-day activities in a few weeks.  It takes 4-8 weeks to get back to unrestricted, intense activity. If you can walk 30 minutes without difficulty, it is safe to try more intense activity such as jogging, treadmill, bicycling, low-impact aerobics, swimming, etc. Save the most intensive and strenuous activity for last (Usually 4-8 weeks after surgery) such as sit-ups, heavy lifting, contact sports, etc.  Refrain from any intense heavy lifting or straining until you are off narcotics for pain control.  You will have off days, but things should improve week-by-week. DO NOT PUSH THROUGH PAIN.  Let pain be your guide: If it hurts to do something, don't do it.  Pain is your body warning you to avoid that activity for another week until the pain goes down.   Increase activity slowly    Complete by:  As directed    Start light daily activities --- self-care, walking, climbing stairs- beginning the day after surgery.  Gradually increase activities as tolerated.  Control your pain to be active.  Stop when you are tired.  Ideally, walk  several times a day, eventually an hour a day.   Most people are back to most day-to-day activities in a few weeks.  It takes 4-8 weeks to get back to unrestricted, intense activity. If you can walk 30 minutes without difficulty, it is safe to try more intense activity such as jogging, treadmill, bicycling, low-impact aerobics, swimming, etc. Save the most intensive and strenuous activity for last (Usually 4-8 weeks after surgery) such as sit-ups, heavy lifting, contact sports, etc.  Refrain from any intense heavy lifting or straining until you are off narcotics for pain control.  You will have off days, but things should improve week-by-week. DO NOT PUSH THROUGH PAIN.  Let pain be your guide: If it hurts to do something, don't do it.  Pain is your body warning you to avoid that activity for another week until the pain goes down.   Lifting restrictions    Complete by:  As directed    If you can walk 30 minutes without difficulty, it is safe to try more intense activity such as jogging, treadmill, bicycling, low-impact aerobics, swimming, etc. Save the most intensive and strenuous activity for last (Usually 4-8 weeks after surgery) such as sit-ups, heavy lifting, contact sports, etc.  Refrain from any intense heavy lifting or straining until you are off narcotics for pain control.  You will have off days, but things should improve week-by-week. DO NOT PUSH THROUGH PAIN.  Let pain be your guide: If it hurts to do something, don't do it.  Pain is your body warning you to avoid that activity for another week until the pain goes down.   Lifting restrictions    Complete by:  As directed    If you can walk 30 minutes without difficulty, it is safe to try more intense activity such as jogging, treadmill, bicycling, low-impact aerobics, swimming, etc. Save the most intensive and strenuous activity for last (Usually 4-8 weeks after surgery) such as sit-ups, heavy lifting, contact sports, etc.  Refrain from any    intense heavy lifting or straining until you are off narcotics for pain control.  You will have off days, but things should improve week-by-week. DO NOT PUSH THROUGH PAIN.  Let pain be your guide: If it hurts to do something, don't do it.  Pain is your body warning you to avoid that activity for another week until the pain goes down.   May walk up steps    Complete by:  As directed    May walk up steps    Complete by:  As directed    No wound care    Complete by:  As directed    It is good for closed incision and even open wounds to be washed every day.  Shower every day.  Short baths are fine.  Wash the incisions and wounds clean with soap & water.    If you have a closed incision(s), wash the incision with soap & water every day.  You may leave closed incisions open to air if it is dry.   You may cover the incision with clean gauze & replace it after your daily shower for comfort. If you have skin tapes (Steristrips) or skin glue (Dermabond) on your incision, leave them in place.  They will fall off on their own like a scab.  You may trim any edges that curl up with clean scissors.  If you have staples, set up an appointment for them to be removed in the office in 10 days after surgery.  If you have a drain, wash around the skin exit site with soap & water and place a new dressing of gauze or band aid around the skin every day.  Keep the drain site clean & dry.   No wound care    Complete by:  As directed    It is good for closed incision and even open wounds to be washed every day.  Shower every day.  Short baths are fine.  Wash the incisions and wounds clean with soap & water.    If you have a closed incision(s), wash the incision with soap & water every day.  You may leave closed incisions open to air if it is dry.   You may cover the incision with clean gauze & replace it after your daily shower for comfort. If you have skin tapes (Steristrips) or skin glue (Dermabond) on your incision, leave  them in place.  They will fall off on their own like a scab.  You may trim any edges that curl up with clean scissors.  If you have staples, set up an appointment for them to be removed in the office in 10 days after surgery.  If you have a drain, wash around the skin exit site with soap & water and place a new dressing of gauze or band aid around the skin every day.  Keep the drain site clean & dry.   Sexual Activity Restrictions    Complete by:  As directed    You may have sexual intercourse when it is comfortable. If it hurts to do something, stop.   Sexual Activity Restrictions    Complete by:  As directed    You may have sexual intercourse when it is comfortable. If it hurts to do something, stop.      Allergies as of 10/27/2016   No Known Allergies     Medication List    TAKE these medications   acetaminophen 500 MG tablet Commonly known as:  TYLENOL Take   500 mg by mouth every 6 (six) hours as needed for moderate pain.   calcium-vitamin D 500-200 MG-UNIT tablet Take 1 tablet by mouth daily.   Cholecalciferol 1000 units capsule Take 1,000 Units by mouth daily.   dorzolamide 2 % ophthalmic solution Commonly known as:  TRUSOPT PLACE 1 DROP INTO BOTH EYES 3 TIMES DAILY.   fluticasone 50 MCG/ACT nasal spray Commonly known as:  FLONASE Place 1 spray into both nostrils daily as needed for allergies or rhinitis.   glucosamine-chondroitin 500-400 MG tablet Take 1 tablet by mouth 2 (two) times daily.   latanoprost 0.005 % ophthalmic solution Commonly known as:  XALATAN Place 1 drop into both eyes daily.   metoprolol tartrate 25 MG tablet Commonly known as:  LOPRESSOR Take 1 tablet (25 mg total) by mouth daily. What changed:  when to take this   multivitamin with minerals Tabs tablet Take 1 tablet by mouth daily.   NON FORMULARY Inhale 1 application into the lungs at bedtime. CPAP for apnea   omega-3 acid ethyl esters 1 g capsule Commonly known as:  LOVAZA Take 1 g by  mouth daily.   perphenazine-amitriptyline 4-25 MG Tabs tablet Commonly known as:  ETRAFON/TRIAVIL Take 1 tablet by mouth at bedtime.   prednisoLONE acetate 1 % ophthalmic suspension Commonly known as:  PRED FORTE Place 1 drop into the right eye daily at 12 noon.   ranitidine 150 MG tablet Commonly known as:  ZANTAC Take 150 mg by mouth daily.   rosuvastatin 5 MG tablet Commonly known as:  CRESTOR Take 5 mg by mouth at bedtime.   senna-docusate 8.6-50 MG tablet Commonly known as:  Senokot-S Take 1 tablet by mouth at bedtime.   traMADol 50 MG tablet Commonly known as:  ULTRAM Take 1-2 tablets (50-100 mg total) by mouth every 6 (six) hours as needed for moderate pain or severe pain.   vitamin B-12 1000 MCG tablet Commonly known as:  CYANOCOBALAMIN Take 1,000 mcg by mouth daily.   warfarin 5 MG tablet Commonly known as:  COUMADIN Take 2.5-5 mg by mouth daily at 6 PM. Takes 1 tablet(32m) daily except 1/2 tablet(2.576m on Mondays and Thursdays.       Significant Diagnostic Studies:  Results for orders placed or performed during the hospital encounter of 10/24/16 (from the past 72 hour(s))  Basic metabolic panel     Status: Abnormal   Collection Time: 10/25/16  4:17 AM  Result Value Ref Range   Sodium 139 135 - 145 mmol/L   Potassium 4.2 3.5 - 5.1 mmol/L   Chloride 107 101 - 111 mmol/L   CO2 26 22 - 32 mmol/L   Glucose, Bld 131 (H) 65 - 99 mg/dL   BUN 11 6 - 20 mg/dL   Creatinine, Ser 1.10 0.61 - 1.24 mg/dL   Calcium 8.6 (L) 8.9 - 10.3 mg/dL   GFR calc non Af Amer 58 (L) >60 mL/min   GFR calc Af Amer >60 >60 mL/min    Comment: (NOTE) The eGFR has been calculated using the CKD EPI equation. This calculation has not been validated in all clinical situations. eGFR's persistently <60 mL/min signify possible Chronic Kidney Disease.    Anion gap 6 5 - 15  CBC     Status: Abnormal   Collection Time: 10/25/16  4:17 AM  Result Value Ref Range   WBC 13.6 (H) 4.0 - 10.5  K/uL   RBC 2.79 (L) 4.22 - 5.81 MIL/uL   Hemoglobin 6.8 (LL) 13.0 -  17.0 g/dL    Comment: REPEATED TO VERIFY CRITICAL RESULT CALLED TO, READ BACK BY AND VERIFIED WITH: PEROTTE, P RN 2.28.18 @0507 ZANDO,C    HCT 21.9 (L) 39.0 - 52.0 %   MCV 78.5 78.0 - 100.0 fL   MCH 24.4 (L) 26.0 - 34.0 pg   MCHC 31.1 30.0 - 36.0 g/dL   RDW 19.2 (H) 11.5 - 15.5 %   Platelets 301 150 - 400 K/uL  Magnesium     Status: None   Collection Time: 10/25/16  4:17 AM  Result Value Ref Range   Magnesium 1.8 1.7 - 2.4 mg/dL  CBC     Status: Abnormal   Collection Time: 10/26/16  4:29 AM  Result Value Ref Range   WBC 12.8 (H) 4.0 - 10.5 K/uL   RBC 3.17 (L) 4.22 - 5.81 MIL/uL   Hemoglobin 7.7 (L) 13.0 - 17.0 g/dL   HCT 24.6 (L) 39.0 - 52.0 %   MCV 77.6 (L) 78.0 - 100.0 fL   MCH 24.3 (L) 26.0 - 34.0 pg   MCHC 31.3 30.0 - 36.0 g/dL   RDW 19.5 (H) 11.5 - 15.5 %   Platelets 342 150 - 400 K/uL  Creatinine, serum     Status: Abnormal   Collection Time: 10/26/16  4:29 AM  Result Value Ref Range   Creatinine, Ser 1.12 0.61 - 1.24 mg/dL   GFR calc non Af Amer 57 (L) >60 mL/min   GFR calc Af Amer >60 >60 mL/min    Comment: (NOTE) The eGFR has been calculated using the CKD EPI equation. This calculation has not been validated in all clinical situations. eGFR's persistently <60 mL/min signify possible Chronic Kidney Disease.   Potassium     Status: None   Collection Time: 10/26/16  4:29 AM  Result Value Ref Range   Potassium 4.0 3.5 - 5.1 mmol/L    No results found.  Discharge Exam: Blood pressure (!) 111/49, pulse (!) 102, temperature 98.2 F (36.8 C), temperature source Oral, resp. rate 16, height 5' 11" (1.803 m), weight 88.7 kg (195 lb 8.8 oz), SpO2 97 %.  General: Pt awake/alert/oriented x4 in No acute distress Eyes: PERRL, normal EOM.  Sclera clear.  No icterus Neuro: CN II-XII intact w/o focal sensory/motor deficits. Lymph: No head/neck/groin lymphadenopathy Psych:  No  delerium/psychosis/paranoia HENT: Normocephalic, Mucus membranes moist.  No thrush Neck: Supple, No tracheal deviation Chest: No chest wall pain w good excursion CV:  Pulses intact.  Regular rhythm MS: Normal AROM mjr joints.  No obvious deformity Abdomen: Soft.  Mildy distended.  Nontender.  Incisions c/d/i.  No evidence of peritonitis.  No incarcerated hernias. Ext:  SCDs BLE.  No mjr edema.  No cyanosis Skin: No petechiae / purpura  Past Medical History:  Diagnosis Date  . Abnormal PSA   . Adenomatous polyp of colon 2010 and before 2006  . Anemia   . Aortic stenosis   . Aortic valve prosthesis present   . Atrial fibrillation (HCC)   . CAD (coronary artery disease)   . DDD (degenerative disc disease), lumbar   . Depression   . Diverticulosis   . Dyslipidemia   . GERD (gastroesophageal reflux disease)   . Glaucoma   . History of blood transfusion 09/29/2016  . Hx of CABG   . Hypogonadism, male   . Macular degeneration   . OSA (obstructive sleep apnea)   . Osteoarthritis   . Osteopenia   . Prostate CA (HCC) prostate 2007     colon dx 2018, watching psa levels, no tx yet for prostate    Past Surgical History:  Procedure Laterality Date  . AORTIC VALVE REPLACEMENT  October 2011   Magna Ease pericardial tissue valve #68m  . BBlaine  lower  . CATARACT EXTRACTION Bilateral   . CORNEAL TRANSPLANT Right   . CORONARY ARTERY BYPASS GRAFT  October 2011   LIMA to LAD  . ESOPHAGOGASTRODUODENOSCOPY N/A 09/16/2014   Procedure: ESOPHAGOGASTRODUODENOSCOPY (EGD);  Surgeon: JIrene Shipper MD;  [facility];  Service: Endoscopy;  Laterality: N/A;  . TONSILLECTOMY  76 yrs ago    Social History   Social History  . Marital status: Married    Spouse name: NIzora Gala . Number of children: 2  . Years of education: N/A   Occupational History  . Not on file.   Social History Main Topics  . Smoking status: Former Smoker    Packs/day: 1.00    Years: 40.00    Types:  Cigarettes    Quit date: 11/16/1990  . Smokeless tobacco: Never Used  . Alcohol use 1.2 oz/week    2 Glasses of wine per week     Comment: wine few x per week  . Drug use: No  . Sexual activity: Not on file   Other Topics Concern  . Not on file   Social History Narrative  . No narrative on file    Family History  Problem Relation Age of Onset  . Heart attack Father   . Colon cancer Neg Hx     Current Facility-Administered Medications  Medication Dose Route Frequency Provider Last Rate Last Dose  . 0.9 %  sodium chloride infusion  250 mL Intravenous PRN SMichael Boston MD      . acetaminophen (TYLENOL) tablet 1,000 mg  1,000 mg Oral TID SMichael Boston MD   1,000 mg at 10/26/16 2101  . albuterol (PROVENTIL) (2.5 MG/3ML) 0.083% nebulizer solution 2.5 mg  2.5 mg Nebulization Q6H PRN SMichael Boston MD      . alum & mag hydroxide-simeth (MAALOX/MYLANTA) 200-200-20 MG/5ML suspension 30 mL  30 mL Oral Q6H PRN SMichael Boston MD   30 mL at 10/26/16 1754  . perphenazine (TRILAFON) tablet 4 mg  4 mg Oral QHS TMinda Ditto RPH   4 mg at 10/26/16 2101   And  . amitriptyline (ELAVIL) tablet 25 mg  25 mg Oral QHS TMinda Ditto RPH   25 mg at 10/26/16 2102  . calcium-vitamin D (OSCAL WITH D) 500-200 MG-UNIT per tablet 1 tablet  1 tablet Oral Daily SMichael Boston MD   1 tablet at 10/26/16 [phone] . cholecalciferol (VITAMIN D) tablet 1,000 Units  1,000 Units Oral Daily SMichael Boston MD   1,000 Units at 10/26/16 [phone] . diphenhydrAMINE (BENADRYL) 12.5 MG/5ML elixir 12.5 mg  12.5 mg Oral Q6H PRN SMichael Boston MD       Or  . diphenhydrAMINE (BENADRYL) injection 12.5 mg  12.5 mg Intravenous Q6H PRN SMichael Boston MD      . dorzolamide (TRUSOPT) 2 % ophthalmic solution 1 drop  1 drop Both Eyes TID SMichael Boston MD   1 drop at 10/26/16 2103  . enoxaparin (LOVENOX) injection 40 mg  40 mg Subcutaneous Q24H SMichael Boston MD   40 mg at 10/27/16 0032  . famotidine (PEPCID) tablet 20 mg  20 mg Oral Daily SMichael Boston MD   20 mg at 10/26/16 0935  . fentaNYL (SUBLIMAZE) injection 12.5-25  mcg  12.5-25 mcg Intravenous Q1H PRN  , MD      . fluticasone (FLONASE) 50 MCG/ACT nasal spray 1 spray  1 spray Each Nare Daily PRN  , MD   1 spray at 10/26/16 0837  . latanoprost (XALATAN) 0.005 % ophthalmic solution 1 drop  1 drop Both Eyes QHS  , MD   1 drop at 10/26/16 2102  . lip balm (CARMEX) ointment 1 application  1 application Topical BID  , MD   1 application at 10/26/16 2104  . magic mouthwash  15 mL Oral QID PRN  , MD      . menthol-cetylpyridinium (CEPACOL) lozenge 3 mg  1 lozenge Oral PRN  , MD      . methocarbamol (ROBAXIN) 1,000 mg in dextrose 5 % 50 mL IVPB  1,000 mg Intravenous Q6H PRN  , MD      . methocarbamol (ROBAXIN) tablet 1,000 mg  1,000 mg Oral Q6H PRN  , MD      . metoprolol (LOPRESSOR) injection 5 mg  5 mg Intravenous Q6H PRN  , MD      . metoprolol tartrate (LOPRESSOR) tablet 12.5 mg  12.5 mg Oral BID  , MD      . multivitamin with minerals tablet 1 tablet  1 tablet Oral Daily  , MD   1 tablet at 10/26/16 0936  . ondansetron (ZOFRAN) tablet 4 mg  4 mg Oral Q6H PRN  , MD       Or  . ondansetron (ZOFRAN) injection 4 mg  4 mg Intravenous Q6H PRN  , MD      . perphenazine-amitriptyline (ETRAFON/TRIAVIL) 4-25 MG per tablet 1 tablet  1 tablet Oral QHS  , MD      . phenol (CHLORASEPTIC) mouth spray 2 spray  2 spray Mouth/Throat PRN  , MD      . prednisoLONE acetate (PRED FORTE) 1 % ophthalmic suspension 1 drop  1 drop Right Eye Q1200  , MD      . prochlorperazine (COMPAZINE) injection 5-10 mg  5-10 mg Intravenous Q4H PRN  , MD      . rosuvastatin (CRESTOR) tablet 5 mg  5 mg Oral QHS  , MD   5 mg at 10/26/16 2101  . saccharomyces boulardii (FLORASTOR) capsule 250 mg  250 mg Oral BID  , MD   250  mg at 10/26/16 2101  . sodium chloride flush (NS) 0.9 % injection 3 mL  3 mL Intravenous Q12H  , MD   3 mL at 10/26/16 2104  . sodium chloride flush (NS) 0.9 % injection 3 mL  3 mL Intravenous PRN  , MD      . sulfamethoxazole-trimethoprim (BACTRIM DS,SEPTRA DS) 800-160 MG per tablet 2 tablet  2 tablet Oral Q12H  , MD   2 tablet at 10/26/16 2100  . traMADol (ULTRAM) tablet 50-100 mg  50-100 mg Oral Q6H PRN  , MD      . vitamin B-12 (CYANOCOBALAMIN) tablet 1,000 mcg  1,000 mcg Oral Daily  , MD   1,000 mcg at 10/26/16 0936  . zolpidem (AMBIEN) tablet 5 mg  5 mg Oral QHS PRN  , MD   5 mg at 10/26/16 2100     No Known Allergies  Signed: , C.   C. , M.D., F.A.C.S. Gastrointestinal and Minimally Invasive Surgery [facility] [address], [address] [place], [place] [address] [phone] Main / Paging   10/27/2016, 7:40 AM    

## 2016-10-28 LAB — BASIC METABOLIC PANEL
Anion gap: 7 (ref 5–15)
BUN: 17 mg/dL (ref 6–20)
CHLORIDE: 106 mmol/L (ref 101–111)
CO2: 24 mmol/L (ref 22–32)
Calcium: 8.5 mg/dL — ABNORMAL LOW (ref 8.9–10.3)
Creatinine, Ser: 1.31 mg/dL — ABNORMAL HIGH (ref 0.61–1.24)
GFR calc Af Amer: 54 mL/min — ABNORMAL LOW (ref >60)
GFR calc non Af Amer: 47 mL/min — ABNORMAL LOW (ref >60)
GLUCOSE: 100 mg/dL — AB (ref 65–99)
POTASSIUM: 4.3 mmol/L (ref 3.5–5.1)
Sodium: 137 mmol/L (ref 135–145)

## 2016-10-28 LAB — CBC
HEMATOCRIT: 22.9 % — AB (ref 39.0–52.0)
Hemoglobin: 7.4 g/dL — ABNORMAL LOW (ref 13.0–17.0)
MCH: 26.2 pg (ref 26.0–34.0)
MCHC: 32.3 g/dL (ref 30.0–36.0)
MCV: 81.2 fL (ref 78.0–100.0)
Platelets: 269 10*3/uL (ref 150–400)
RBC: 2.82 MIL/uL — ABNORMAL LOW (ref 4.22–5.81)
RDW: 19.2 % — AB (ref 11.5–15.5)
WBC: 12.6 10*3/uL — ABNORMAL HIGH (ref 4.0–10.5)

## 2016-10-28 LAB — PREPARE RBC (CROSSMATCH)

## 2016-10-28 LAB — HEMOGLOBIN AND HEMATOCRIT, BLOOD
HEMATOCRIT: 27.8 % — AB (ref 39.0–52.0)
Hemoglobin: 9 g/dL — ABNORMAL LOW (ref 13.0–17.0)

## 2016-10-28 LAB — PROTIME-INR
INR: 1.3
Prothrombin Time: 16.3 seconds — ABNORMAL HIGH (ref 11.4–15.2)

## 2016-10-28 MED ORDER — LATANOPROST 0.005 % OP SOLN
1.0000 [drp] | Freq: Every day | OPHTHALMIC | Status: DC
  Administered 2016-10-29 – 2016-11-01 (×4): 1 [drp] via OPHTHALMIC
  Filled 2016-10-28: qty 2.5

## 2016-10-28 MED ORDER — MORPHINE SULFATE (PF) 4 MG/ML IV SOLN: 1.0000 mg | INTRAVENOUS | Status: DC | PRN

## 2016-10-28 MED ORDER — ONDANSETRON 4 MG PO TBDP: 4.0000 mg | ORAL_TABLET | Freq: Four times a day (QID) | ORAL | Status: DC | PRN

## 2016-10-28 MED ORDER — ALUM & MAG HYDROXIDE-SIMETH 200-200-20 MG/5ML PO SUSP
15.0000 mL | Freq: Four times a day (QID) | ORAL | Status: DC | PRN
  Administered 2016-10-28 – 2016-10-30 (×2): 15 mL via ORAL
  Filled 2016-10-28 (×2): qty 30

## 2016-10-28 MED ORDER — PANTOPRAZOLE SODIUM 40 MG IV SOLR
40.0000 mg | Freq: Every day | INTRAVENOUS | Status: DC
  Administered 2016-10-28 – 2016-10-29 (×2): 40 mg via INTRAVENOUS
  Filled 2016-10-28 (×2): qty 40

## 2016-10-28 MED ORDER — HYDROCODONE-ACETAMINOPHEN 5-325 MG PO TABS: 1.0000 | ORAL_TABLET | ORAL | Status: DC | PRN

## 2016-10-28 MED ORDER — METOPROLOL TARTRATE 25 MG PO TABS
25.0000 mg | ORAL_TABLET | Freq: Every day | ORAL | Status: DC
  Administered 2016-10-28: 25 mg via ORAL
  Filled 2016-10-28: qty 1

## 2016-10-28 MED ORDER — SODIUM CHLORIDE 0.9 % IV SOLN
Freq: Once | INTRAVENOUS | Status: AC
  Administered 2016-10-28: 14:00:00 via INTRAVENOUS

## 2016-10-28 MED ORDER — ONDANSETRON HCL 4 MG/2ML IJ SOLN
4.0000 mg | Freq: Four times a day (QID) | INTRAMUSCULAR | Status: DC | PRN
  Administered 2016-10-28 – 2016-10-29 (×4): 4 mg via INTRAVENOUS
  Filled 2016-10-28 (×4): qty 2

## 2016-10-28 MED ORDER — PREDNISOLONE ACETATE 1 % OP SUSP
1.0000 [drp] | Freq: Every day | OPHTHALMIC | Status: DC
  Administered 2016-10-29 – 2016-10-31 (×3): 1 [drp] via OPHTHALMIC
  Filled 2016-10-28: qty 5

## 2016-10-28 NOTE — ED Notes (Signed)
Pt. completed 2 units PRBC transfusions without reactions, latest V/S within normal ranges, recorded, pt. Afebrile. Dr. Lucia Gaskins notified of pt.'s hgb post BT , 7.4 from 6 last night. Dr. Lucia Gaskins ordered for 1 unit PRBC (3rd bag) transfusion and to notify Lab for 2 units ahead for possible BT. Pt. Denied any pain nor SOB. Alert and oriented x4.

## 2016-10-28 NOTE — Plan of Care (Signed)
Problem: Safety: Goal: Ability to remain free from injury will improve Outcome: Completed/Met Date Met: 10/28/16 Discussed safety prevention plan. Pt is aware to call for assistance.Patient and family verbalized understanding.

## 2016-10-28 NOTE — Progress Notes (Signed)
Provider paged to make aware pt has arrived to unit

## 2016-10-28 NOTE — Progress Notes (Signed)
[facility]:  [phone] General Surgery Progress Note   LOS: 1 day  POD -     Assessment/Plan: 1.  GI bleed - presumably from the anastomosis             Hgb - 6.0 - 10/27/2016             Hgb - 7.4 - 10/28/2016 (after 2 units PRBC)  Stable hemodynamics.  There are no ICU/stepdown beds - so he spent the night in the ER.  Will put in monitored bed.    On clear liquids, but not taking much.  Has reflux.  2. Cecal cancer              Final path not back             s/p robotic proximal colectomy 10/24/2016 - Gross             Hospitalized at [facility] from 10/24/2016 - 10/27/2016 3. Anxiety state 4.  OSA (obstructive sleep apnea)             On CPAP 5.Chronic anticoagulation             He has not restarted his coumadin since surgery 6. HOH (hard of hearing) 7.   Renal insufficiency - better             Creatinine - 1.31 - 10/27/2016  8.  Aortic valve replacement surgery and CABG by [name] in 2011 9.  History of atrial fibrillation             Followed by Dr. P Martinique 10.  Prostate cancer followed by Dr. Ellwood Sayers 11. Left kidney stone on CT 12.  DVT prophylaxis - on hold for GI bleed   Active Problems:   Lower GI bleed   Subjective:  Did not sleep last PM.  He spent the night in the ER.  3 more BM's of blood, but not it looks like "old' blood.  Has heartburn.  Son, Nicole Kindred, in room with patient.  Objective:   Vitals:   10/28/16 0615 10/28/16 0656  BP: 98/72 98/61  Pulse: (!) 128 83  Resp: 23 16  Temp:       Intake/Output from previous day:  03/02 0701 - 03/03 0700 In: 1840 [I.V.:835; Blood:1005] Out: -   Intake/Output this shift:  No intake/output data recorded.   Physical Exam:   General: Older WM who is alert and oriented.    HEENT: Normal. Pupils equal. .   Lungs: Clear   Abdomen: Mild distention.  Has a few BS.   Wound: Clean   Lab Results:    Recent Labs  10/27/16 1700 10/28/16 0520  WBC 11.4* 12.6*  HGB 6.0* 7.4*  HCT  19.3* 22.9*  PLT 325 269    BMET   Recent Labs  10/27/16 1700 10/28/16 0520  NA 133* 137  K 3.6 4.3  CL 100* 106  CO2 24 24  GLUCOSE 189* 100*  BUN 17 17  CREATININE 1.56* 1.31*  CALCIUM 8.6* 8.5*    PT/INR   Recent Labs  10/27/16 1700 10/28/16 0520  LABPROT 16.1* 16.3*  INR 1.29 1.30    ABG  No results for input(s): PHART, HCO3 in the last 72 hours.  Invalid input(s): PCO2, PO2   Studies/Results:  Ct Abdomen Pelvis W Contrast  Result Date: 10/27/2016 CLINICAL DATA:  GI bleed. Recent abdominal surgery. Patient underwent robotic proximal right colectomy for cecal cancer 3 days prior.  EXAM: CT ABDOMEN AND PELVIS WITH CONTRAST TECHNIQUE: Multidetector CT imaging of the abdomen and pelvis was performed using the standard protocol following bolus administration of intravenous contrast. CONTRAST:  63mL ISOVUE-300 IOPAMIDOL (ISOVUE-300) INJECTION 61% COMPARISON:  CT 08/08/2016 FINDINGS: Lower chest: Stable cardiomegaly, mitral annulus and coronary artery calcifications. Minimal dependent atelectasis, right greater than left. Trace right pleural effusion. Hepatobiliary: No focal hepatic lesion. Gallbladder is distended without calcified stone. No biliary dilatation. Small amount perihepatic fluid. Pancreas: No ductal dilatation or inflammation. Spleen: Normal in size without focal abnormality. Adrenals/Urinary Tract: No adrenal nodule. Nonobstructing left renal stone. No hydronephrosis or perinephric edema. Bilateral renal parenchymal thinning. The ureters are decompressed. Urinary bladder is physiologically distended, no wall thickening. Stomach/Bowel: Patient is post right hemicolectomy 3 days prior, enteric sutures noted at the ileal colonic anastomosis. No perianal stenotic wall thickening or inflammation. Small hiatal hernia, stomach is distended with ingested contrast. No enteric contrast distal to the stomach. Proximal small bowel is decompressed, the distal small bowel are prominent  fluid-filled. Colon is diffusely fluid-filled without wall thickening. Diverticula are seen involving the descending and sigmoid colon. Sigmoid colon is tortuous and courses in the right abdomen. No evidence of obstruction. Scattered free air in the abdomen likely sequela of recent abdominal surgery. Trace free fluid in the pericolic gutters, about the liver, and presacral pelvis. No loculated abscess. Vascular/Lymphatic: Aortic atherosclerosis without aneurysm. No evidence of adenopathy. Reproductive: No focal prostatic abnormality. Other: Postsurgical change in the anterior abdominal wall with scattered air and edema. No confluent subcutaneous hematoma. Minimal air and edema track in the bilateral flank soft tissues. Edematous changes track into the right inguinal canal. Musculoskeletal: Stable degenerative change in the lumbar spine without acute abnormality. IMPRESSION: 1. No abnormality to explain GI bleed. Post recent right hemicolectomy without apparent complication. Distal small bowel is fluid-filled and prominent, with fluid throughout the entire colon, however no bowel wall thickening or inflammation. Small volume of free air and free fluid in the abdomen and pelvis, not unexpected post recent abdominal surgery. 2. Distal colonic diverticulosis without diverticulitis. 3. Incidental findings of nonobstructing left renal stone and intra-abdominal atherosclerosis. Electronically Signed   By: Jeb Levering M.D.   On: 10/27/2016 20:31     Anti-infectives:   Anti-infectives    None      Alphonsa Overall, MD, FACS Pager: [phone] Surgery Office: [phone] 10/28/2016

## 2016-10-28 NOTE — ED Notes (Signed)
Flow manager called and told to downgrade this bed to a med surg per [employer].

## 2016-10-28 NOTE — Progress Notes (Signed)
Patient experienced 5 beats VTACH, VS stable 113, 125/60. Patient also has 1 bloody BM and c/o N/V, 50 cc emesis. Zofran administered. For now N/V has improved, will continue to monitor. Will call provider with update.

## 2016-10-28 NOTE — ED Notes (Signed)
Patient refused eye medication due to his wife was brining them from home.

## 2016-10-29 DIAGNOSIS — R011 Cardiac murmur, unspecified: Secondary | ICD-10-CM

## 2016-10-29 DIAGNOSIS — I482 Chronic atrial fibrillation: Secondary | ICD-10-CM

## 2016-10-29 DIAGNOSIS — I2510 Atherosclerotic heart disease of native coronary artery without angina pectoris: Secondary | ICD-10-CM

## 2016-10-29 LAB — CBC WITH DIFFERENTIAL/PLATELET
BASOS PCT: 0 %
Basophils Absolute: 0 10*3/uL (ref 0.0–0.1)
EOS PCT: 0 %
Eosinophils Absolute: 0 10*3/uL (ref 0.0–0.7)
HEMATOCRIT: 28.7 % — AB (ref 39.0–52.0)
Hemoglobin: 9.3 g/dL — ABNORMAL LOW (ref 13.0–17.0)
Lymphocytes Relative: 7 %
Lymphs Abs: 0.9 10*3/uL (ref 0.7–4.0)
MCH: 27.4 pg (ref 26.0–34.0)
MCHC: 32.4 g/dL (ref 30.0–36.0)
MCV: 84.4 fL (ref 78.0–100.0)
MONO ABS: 1.2 10*3/uL — AB (ref 0.1–1.0)
MONOS PCT: 10 %
NEUTROS ABS: 9.8 10*3/uL — AB (ref 1.7–7.7)
Neutrophils Relative %: 83 %
PLATELETS: 270 10*3/uL (ref 150–400)
RBC: 3.4 MIL/uL — ABNORMAL LOW (ref 4.22–5.81)
RDW: 20.6 % — AB (ref 11.5–15.5)
WBC: 11.9 10*3/uL — ABNORMAL HIGH (ref 4.0–10.5)

## 2016-10-29 LAB — BASIC METABOLIC PANEL
Anion gap: 8 (ref 5–15)
BUN: 15 mg/dL (ref 6–20)
CALCIUM: 8.1 mg/dL — AB (ref 8.9–10.3)
CO2: 24 mmol/L (ref 22–32)
CREATININE: 1.07 mg/dL (ref 0.61–1.24)
Chloride: 106 mmol/L (ref 101–111)
GFR, EST NON AFRICAN AMERICAN: 60 mL/min — AB (ref >60)
GLUCOSE: 105 mg/dL — AB (ref 65–99)
Potassium: 3.8 mmol/L (ref 3.5–5.1)
Sodium: 138 mmol/L (ref 135–145)

## 2016-10-29 MED ORDER — ZOLPIDEM TARTRATE 5 MG PO TABS
5.0000 mg | ORAL_TABLET | Freq: Every evening | ORAL | Status: DC | PRN
Start: 2016-10-29 — End: 2016-11-01

## 2016-10-29 MED ORDER — METOPROLOL TARTRATE 25 MG PO TABS
37.5000 mg | ORAL_TABLET | Freq: Two times a day (BID) | ORAL | Status: DC
  Administered 2016-10-29 – 2016-11-01 (×6): 37.5 mg via ORAL
  Filled 2016-10-29 (×6): qty 2

## 2016-10-29 NOTE — Progress Notes (Addendum)
Assessment 1. GI bleed - presumably from the anastomosis; hemoglobin is 9.3 and stable since transfusion. Has postop ileus now.  2. Cecal cancer  Final path not back s/p robotic proximal colectomy 10/24/2016 - Gross Hospitalized at [facility] from 10/24/2016 - 10/27/2016 3. Anxiety state 4. OSA (obstructive sleep apnea) On CPAP 5.Chronic anticoagulation He has not restarted his coumadin since surgery 6. HOH (hard of hearing) 7. Renal insufficiency - resolved 8. Aortic valve replacement surgery and CABGby [name] in 2011 9. History of atrial fibrillation-having some wide complex tachycardia episodes as well. Followed by Dr. P Martinique 10. Prostate cancer followed by Dr. Ellwood Sayers 11. Left kidney stone on CT 12.  DVT prophylaxis - on hold for GI bleed  Plan:  Stat EKG. Ask Cardiology about wide complex tachycardia, possible V-Tach; continue ice chips for now; ambulate. Check hemoglobin tomorrow.   LOS: 2 days        Subjective: Feels distended and nauseated. Passed some dark stool this AM.  Passed a little gas.  Had some vomiting after clear liquids yesterday.   Objective: Vital signs in last 24 hours: Temp:  [98.1 F (36.7 C)-99 F (37.2 C)] 98.1 F (36.7 C) (03/04 0514) Pulse Rate:  [99-126] 120 (03/04 0514) Resp:  [16-24] 18 (03/04 0514) BP: (114-143)/(60-85) 114/74 (03/04 0514) SpO2:  [97 %-100 %] 98 % (03/04 0514) Weight:  [82.3 kg (181 lb 6.4 oz)] 82.3 kg (181 lb 6.4 oz) (03/03 1425) Last BM Date: 10/28/16  Intake/Output from previous day: 03/03 0701 - 03/04 0700 In: 3466.7 [I.V.:3000; Blood:466.7] Out: 50 [Emesis/NG output:50] Intake/Output this shift: No intake/output data recorded.  PE: General- In NAD Abdomen-firm, distended, hypoactive bowel sounds, wounds clean and intact  Lab Results:   Recent Labs  10/28/16 0520 10/28/16 1536 10/29/16 0537  WBC 12.6*  --   11.9*  HGB 7.4* 9.0* 9.3*  HCT 22.9* 27.8* 28.7*  PLT 269  --  270   BMET  Recent Labs  10/28/16 0520 10/29/16 0537  NA 137 138  K 4.3 3.8  CL 106 106  CO2 24 24  GLUCOSE 100* 105*  BUN 17 15  CREATININE 1.31* 1.07  CALCIUM 8.5* 8.1*   PT/INR  Recent Labs  10/27/16 1700 10/28/16 0520  LABPROT 16.1* 16.3*  INR 1.29 1.30   Comprehensive Metabolic Panel:    Component Value Date/Time   NA 138 10/29/2016 0537   NA 137 10/28/2016 0520   NA 141 08/10/2016 1221   NA 140 06/29/2016 1205   K 3.8 10/29/2016 0537   K 4.3 10/28/2016 0520   K 4.3 08/10/2016 1221   K 4.3 06/29/2016 1205   CL 106 10/29/2016 0537   CL 106 10/28/2016 0520   CO2 24 10/29/2016 0537   CO2 24 10/28/2016 0520   CO2 25 08/10/2016 1221   CO2 26 06/29/2016 1205   BUN 15 10/29/2016 0537   BUN 17 10/28/2016 0520   BUN 15.5 08/10/2016 1221   BUN 16.3 06/29/2016 1205   CREATININE 1.07 10/29/2016 0537   CREATININE 1.31 (H) 10/28/2016 0520   CREATININE 1.1 08/10/2016 1221   CREATININE 1.0 06/29/2016 1205   GLUCOSE 105 (H) 10/29/2016 0537   GLUCOSE 100 (H) 10/28/2016 0520   GLUCOSE 97 08/10/2016 1221   GLUCOSE 98 06/29/2016 1205   CALCIUM 8.1 (L) 10/29/2016 0537   CALCIUM 8.5 (L) 10/28/2016 0520   CALCIUM 8.9 08/10/2016 1221   CALCIUM 8.8 06/29/2016 1205   AST 26 10/27/2016 1700   AST 20  08/22/2016 1540   AST 17 08/10/2016 1221   AST 16 06/29/2016 1205   ALT 17 10/27/2016 1700   ALT 14 (L) 08/22/2016 1540   ALT 16 08/10/2016 1221   ALT 13 06/29/2016 1205   ALKPHOS 39 10/27/2016 1700   ALKPHOS 47 08/22/2016 1540   ALKPHOS 63 08/10/2016 1221   ALKPHOS 58 06/29/2016 1205   BILITOT 0.6 10/27/2016 1700   BILITOT 0.2 (L) 08/22/2016 1540   BILITOT 0.22 08/10/2016 1221   BILITOT 0.25 06/29/2016 1205   PROT 6.0 (L) 10/27/2016 1700   PROT 7.1 08/22/2016 1540   PROT 7.3 08/10/2016 1221   PROT 7.4 06/29/2016 1205   ALBUMIN 3.1 (L) 10/27/2016 1700   ALBUMIN 3.7 08/22/2016 1540   ALBUMIN 3.6  08/10/2016 1221   ALBUMIN 3.7 06/29/2016 1205     Studies/Results: Ct Abdomen Pelvis W Contrast  Result Date: 10/27/2016 CLINICAL DATA:  GI bleed. Recent abdominal surgery. Patient underwent robotic proximal right colectomy for cecal cancer 3 days prior. EXAM: CT ABDOMEN AND PELVIS WITH CONTRAST TECHNIQUE: Multidetector CT imaging of the abdomen and pelvis was performed using the standard protocol following bolus administration of intravenous contrast. CONTRAST:  47mL ISOVUE-300 IOPAMIDOL (ISOVUE-300) INJECTION 61% COMPARISON:  CT 08/08/2016 FINDINGS: Lower chest: Stable cardiomegaly, mitral annulus and coronary artery calcifications. Minimal dependent atelectasis, right greater than left. Trace right pleural effusion. Hepatobiliary: No focal hepatic lesion. Gallbladder is distended without calcified stone. No biliary dilatation. Small amount perihepatic fluid. Pancreas: No ductal dilatation or inflammation. Spleen: Normal in size without focal abnormality. Adrenals/Urinary Tract: No adrenal nodule. Nonobstructing left renal stone. No hydronephrosis or perinephric edema. Bilateral renal parenchymal thinning. The ureters are decompressed. Urinary bladder is physiologically distended, no wall thickening. Stomach/Bowel: Patient is post right hemicolectomy 3 days prior, enteric sutures noted at the ileal colonic anastomosis. No perianal stenotic wall thickening or inflammation. Small hiatal hernia, stomach is distended with ingested contrast. No enteric contrast distal to the stomach. Proximal small bowel is decompressed, the distal small bowel are prominent fluid-filled. Colon is diffusely fluid-filled without wall thickening. Diverticula are seen involving the descending and sigmoid colon. Sigmoid colon is tortuous and courses in the right abdomen. No evidence of obstruction. Scattered free air in the abdomen likely sequela of recent abdominal surgery. Trace free fluid in the pericolic gutters, about the liver,  and presacral pelvis. No loculated abscess. Vascular/Lymphatic: Aortic atherosclerosis without aneurysm. No evidence of adenopathy. Reproductive: No focal prostatic abnormality. Other: Postsurgical change in the anterior abdominal wall with scattered air and edema. No confluent subcutaneous hematoma. Minimal air and edema track in the bilateral flank soft tissues. Edematous changes track into the right inguinal canal. Musculoskeletal: Stable degenerative change in the lumbar spine without acute abnormality. IMPRESSION: 1. No abnormality to explain GI bleed. Post recent right hemicolectomy without apparent complication. Distal small bowel is fluid-filled and prominent, with fluid throughout the entire colon, however no bowel wall thickening or inflammation. Small volume of free air and free fluid in the abdomen and pelvis, not unexpected post recent abdominal surgery. 2. Distal colonic diverticulosis without diverticulitis. 3. Incidental findings of nonobstructing left renal stone and intra-abdominal atherosclerosis. Electronically Signed   By: Jeb Levering M.D.   On: 10/27/2016 20:31    Anti-infectives: Anti-infectives    None       Serigne Kubicek J 10/29/2016

## 2016-10-29 NOTE — Consult Note (Signed)
CARDIOLOGY CONSULT NOTE     Primary Care Physician: Jerlyn Ly, MD Referring Physician: Etta Quill Date: 10/27/2016  Reason for consultation: atrial fibrillation  Jason [name] is a 83 y.o. male with a h/o Coronary artery disease status post CABG in 2011, atrial fibrillation, prosthetic aortic valve, hypertension who presented to the hospital after lower GI bleeding. He had a right hemicolectomy for cecal cancer on 03/23/17. He was admitted to stepdown ICU and transfused. His hemoglobin came up to 9.3. His hospital course is been compensated by postop ileus. He did go into atrial fibrillation, and also had a wide complex tachycardia on telemetry.    Today, he denies symptoms of palpitations, chest pain, shortness of breath, orthopnea, PND, lower extremity edema, dizziness, presyncope, syncope, or neurologic sequela. The patient is tolerating medications without difficulties and is otherwise without complaint today. He says that he is feeling bloated passing quite a bit of gas. Otherwise he has no major complaint.  Past Medical History:  Diagnosis Date  . Abnormal PSA   . Adenomatous polyp of colon 2010 and before 2006  . Anemia   . Aortic stenosis   . Aortic valve prosthesis present   . Atrial fibrillation ([place])   . CAD (coronary artery disease)   . DDD (degenerative disc disease), lumbar   . Depression   . Diverticulosis   . Dyslipidemia   . GERD (gastroesophageal reflux disease)   . Glaucoma   . History of blood transfusion 09/29/2016  . Hx of CABG   . Hypogonadism, male   . Macular degeneration   . OSA (obstructive sleep apnea)   . Osteoarthritis   . Osteopenia   . Prostate CA [facility]) prostate 2007   colon dx 2018, watching psa levels, no tx yet for prostate   Past Surgical History:  Procedure Laterality Date  . AORTIC VALVE REPLACEMENT  October 2011   Magna Ease pericardial tissue valve #9mm  . [place]   lower  . CATARACT EXTRACTION Bilateral   .  CORNEAL TRANSPLANT Right   . CORONARY ARTERY BYPASS GRAFT  October 2011   LIMA to LAD  . ESOPHAGOGASTRODUODENOSCOPY N/A 09/16/2014   Procedure: ESOPHAGOGASTRODUODENOSCOPY (EGD);  Surgeon: [name], MD;  Location: [facility] ENDOSCOPY;  Service: Endoscopy;  Laterality: N/A;  . TONSILLECTOMY  76 yrs ago    . latanoprost  1 drop Both Eyes Daily  . metoprolol tartrate  25 mg Oral QHS  . pantoprazole (PROTONIX) IV  40 mg Intravenous QHS  . prednisoLONE acetate  1 drop Right Eye Q1200   . sodium chloride 125 mL/hr at 10/29/16 1525    No Known Allergies  Social History   Social History  . Marital status: Married    Spouse name: Izora Gala  . Number of children: 2  . Years of education: N/A   Occupational History  . Not on file.   Social History Main Topics  . Smoking status: Former Smoker    Packs/day: 1.00    Years: 40.00    Types: Cigarettes    Quit date: 11/16/1990  . Smokeless tobacco: Never Used  . Alcohol use 1.2 oz/week    2 Glasses of wine per week     Comment: wine few x per week  . Drug use: No  . Sexual activity: Not on file   Other Topics Concern  . Not on file   Social History Narrative  . No narrative on file    Family History  Problem Relation  Age of Onset  . Heart attack Father   . Colon cancer Neg Hx     ROS- All systems are reviewed and negative except as per the HPI above  Physical Exam: Telemetry: Vitals:   10/28/16 1717 10/28/16 2106 10/29/16 0514 10/29/16 1343  BP: 125/60 114/63 114/74 112/70  Pulse: (!) 113 99 (!) 120 (!) 101  Resp:  16 18 18   Temp:  98.9 F (37.2 C) 98.1 F (36.7 C) 97.7 F (36.5 C)  TempSrc:  Oral Oral Oral  SpO2:  100% 98% 96%  Weight:      Height:        GEN- The patient is well appearing, alert and oriented x 3 today.   Head- normocephalic, atraumatic Eyes-  Sclera clear, conjunctiva pink Ears- hearing intact Oropharynx- clear Neck- supple, no JVP Lymph- no cervical lymphadenopathy Lungs- Clear to ausculation  bilaterally, normal work of breathing Heart- iRRR, 2/6 systolic murmur at the base, no rubs or gallops, PMI not laterally displaced GI- soft, NT, ND, + BS Extremities- no clubbing, cyanosis, or edema MS- no significant deformity or atrophy Skin- no rash or lesion Psych- euthymic mood, full affect Neuro- strength and sensation are intact  EKG: atrial fibrillation with RVR  Labs:   Lab Results  Component Value Date   WBC 11.9 (H) 10/29/2016   HGB 9.3 (L) 10/29/2016   HCT 28.7 (L) 10/29/2016   MCV 84.4 10/29/2016   PLT 270 10/29/2016    Recent Labs Lab 10/27/16 1700  10/29/16 0537  NA 133*  < > 138  K 3.6  < > 3.8  CL 100*  < > 106  CO2 24  < > 24  BUN 17  < > 15  CREATININE 1.56*  < > 1.07  CALCIUM 8.6*  < > 8.1*  PROT 6.0*  --   --   BILITOT 0.6  --   --   ALKPHOS 39  --   --   ALT 17  --   --   AST 26  --   --   GLUCOSE 189*  < > 105*  < > = values in this interval not displayed. Lab Results  Component Value Date   TROPONINI <0.03 09/15/2014    Lab Results  Component Value Date   CHOL 89 01/03/2012   Lab Results  Component Value Date   HDL 37.40 (L) 01/03/2012   Lab Results  Component Value Date   LDLCALC 27 01/03/2012   Lab Results  Component Value Date   TRIG 125.0 01/03/2012   Lab Results  Component Value Date   CHOLHDL 2 01/03/2012   No results found for: LDLDIRECT    Echo 2015: - Left ventricle: The cavity size was normal. Wall thickness was increased in a pattern of mild LVH. There was mild concentric hypertrophy. Systolic function was normal. The estimated ejection fraction was in the range of 55% to 60%. Wall motion was normal; there were no regional wall motion abnormalities. - Ventricular septum: Septal motion showed paradox. - Aortic valve: A bioprosthesis was present and functioning normally. Valve area (VTI): 1.6 cm^2. Valve area (Vmax): 1.5 cm^2. Valve area (Vmean): 1.33 cm^2. - Mitral valve: Calcified, moderately to  severely fibrotic annulus. Mildly thickened leaflets . The findings are consistent with trivial stenosis. There was mild regurgitation directed centrally. Valve area by continuity equation (using LVOT flow): 2.6 cm^2. - Left atrium: The atrium was moderately dilated. - Right atrium: The atrium was mildly dilated.  ASSESSMENT AND  PLAN:   1. Permanent atrial fibrillation: His atrial fibrillation is continued in this hospitalization. He has had rapid rates, with some episodes of wide complex tachycardia. Some of these episodes appear to be due to aberrancy, but some of them could also be due to ventricular arrhythmias. He has been asymptomatic, and has not had any further wide complex tachycardias since early this morning. He is currently on once daily dosing of metoprolol. Due to his atrial fibrillation with rapid rates, Jason [name] increase his metoprolol to 37.5 mg twice a day. It is likely that his atrial fibrillation and wide complex tachycardia is due to his ongoing illness. This may decrease as his GI symptoms improved.  2. Coronary artery disease: Status post CABG. Currently not on antiplatelets as he is having GI bleeding. No current chest pain.  3. Prosthetic aortic valve: Does have a murmur, but without shortness of breath. Continue current management.  4. PVCs: Has been asymptomatic in the past. No further management needed.   Jason [name] Meredith Leeds, MD 10/29/2016  3:31 PM

## 2016-10-29 NOTE — Progress Notes (Signed)
CMT notified RN of patient having 26 beats wide complex QRS. Patient asymptomatic. MD notified. Will continue to monitor.

## 2016-10-30 DIAGNOSIS — Z952 Presence of prosthetic heart valve: Secondary | ICD-10-CM

## 2016-10-30 DIAGNOSIS — K922 Gastrointestinal hemorrhage, unspecified: Secondary | ICD-10-CM

## 2016-10-30 LAB — HEMOGLOBIN: HEMOGLOBIN: 9.3 g/dL — AB (ref 13.0–17.0)

## 2016-10-30 MED ORDER — LACTATED RINGERS IV BOLUS (SEPSIS): 1000.0000 mL | Freq: Three times a day (TID) | INTRAVENOUS | Status: DC | PRN

## 2016-10-30 MED ORDER — FERROUS SULFATE 325 (65 FE) MG PO TABS
325.0000 mg | ORAL_TABLET | Freq: Two times a day (BID) | ORAL | Status: DC
  Administered 2016-10-30 – 2016-11-01 (×5): 325 mg via ORAL
  Filled 2016-10-30 (×5): qty 1

## 2016-10-30 MED ORDER — DORZOLAMIDE HCL 2 % OP SOLN
1.0000 [drp] | Freq: Three times a day (TID) | OPHTHALMIC | Status: DC
  Administered 2016-10-30 – 2016-11-01 (×7): 1 [drp] via OPHTHALMIC
  Filled 2016-10-30: qty 10

## 2016-10-30 MED ORDER — FLUTICASONE PROPIONATE 50 MCG/ACT NA SUSP: 1.0000 | Freq: Every day | NASAL | Status: DC | PRN

## 2016-10-30 MED ORDER — AMITRIPTYLINE HCL 25 MG PO TABS
25.0000 mg | ORAL_TABLET | Freq: Every day | ORAL | Status: DC
  Administered 2016-10-30 – 2016-10-31 (×2): 25 mg via ORAL
  Filled 2016-10-30 (×2): qty 1

## 2016-10-30 MED ORDER — VITAMIN B-12 1000 MCG PO TABS
1000.0000 ug | ORAL_TABLET | Freq: Every day | ORAL | Status: DC
  Administered 2016-10-30 – 2016-11-01 (×3): 1000 ug via ORAL
  Filled 2016-10-30 (×3): qty 1

## 2016-10-30 MED ORDER — FAMOTIDINE 20 MG PO TABS
20.0000 mg | ORAL_TABLET | Freq: Two times a day (BID) | ORAL | Status: DC
  Administered 2016-10-30 – 2016-11-01 (×5): 20 mg via ORAL
  Filled 2016-10-30 (×5): qty 1

## 2016-10-30 MED ORDER — PERPHENAZINE 4 MG PO TABS
4.0000 mg | ORAL_TABLET | Freq: Every day | ORAL | Status: DC
  Administered 2016-10-30 – 2016-10-31 (×2): 4 mg via ORAL
  Filled 2016-10-30 (×2): qty 1

## 2016-10-30 MED ORDER — ADULT MULTIVITAMIN W/MINERALS CH
1.0000 | ORAL_TABLET | Freq: Every day | ORAL | Status: DC
  Administered 2016-10-30 – 2016-11-01 (×3): 1 via ORAL
  Filled 2016-10-30 (×3): qty 1

## 2016-10-30 MED ORDER — ACETAMINOPHEN 500 MG PO TABS: 500.0000 mg | ORAL_TABLET | Freq: Four times a day (QID) | ORAL | Status: DC | PRN

## 2016-10-30 MED ORDER — CALCIUM CARBONATE-VITAMIN D 500-200 MG-UNIT PO TABS
1.0000 | ORAL_TABLET | Freq: Every day | ORAL | Status: DC
  Administered 2016-10-30 – 2016-11-01 (×3): 1 via ORAL
  Filled 2016-10-30 (×5): qty 1

## 2016-10-30 MED ORDER — ROSUVASTATIN CALCIUM 5 MG PO TABS
5.0000 mg | ORAL_TABLET | Freq: Every day | ORAL | Status: DC
  Administered 2016-10-30 – 2016-10-31 (×2): 5 mg via ORAL
  Filled 2016-10-30 (×2): qty 1

## 2016-10-30 MED ORDER — LACTATED RINGERS IV BOLUS (SEPSIS): 1000.0000 mL | Freq: Three times a day (TID) | INTRAVENOUS | Status: AC | PRN

## 2016-10-30 MED ORDER — PERPHENAZINE-AMITRIPTYLINE 4-25 MG PO TABS
1.0000 | ORAL_TABLET | Freq: Every day | ORAL | Status: DC
  Filled 2016-10-30: qty 1

## 2016-10-30 MED ORDER — TRAMADOL HCL 50 MG PO TABS: 50.0000 mg | ORAL_TABLET | Freq: Four times a day (QID) | ORAL | Status: DC | PRN

## 2016-10-30 NOTE — Progress Notes (Addendum)
[facility]  [address] [place], [place] [address] Phone: [phone] FAX: [place] [phone] 13-Oct-1925    Problem List:   Active Problems:   Lower GI bleed      10/24/2016  POST-OPERATIVE DIAGNOSIS:  Cecal cancer  PROCEDURE:  XI ROBOT PROXIMAL RIGHT COLECTOMY    SURGEON:  Adin Hector, MD    Assessment  Recovering  Plan:  -adv diet to solids -hold IVF w backup IVF boluses -anemia - acute postop on chronic - Hgb improved s/p transfusion.  Transfuse if worse or Sx. Restart warfarin if Hgb stable > 48 & no GI bleeding -H2B - add PPI for now -fiber & iron bowel regimen -VTE prophylaxis- SCDs, etc -mobilize as tolerated to help recovery  I updated the patient's status to the patient and spouse.  Recommendations were made.  Questions were answered.  They expressed understanding & appreciation.   Adin Hector, M.D., F.A.C.S. Gastrointestinal and Minimally Invasive Surgery [facility] [address] [place],  [address] [phone] Main / Paging   10/30/2016  CARE TEAM:  PCP: Jerlyn Ly, MD  Outpatient Care Team: Patient Care Team: [name], MD as PCP - General (Internal Medicine) [name], MD as Consulting Physician (General Surgery) [name], MD as Consulting Physician (Gastroenterology) [name], MD as Consulting Physician (Cardiology) Rana Snare, MD as Consulting Physician (Urology)  Inpatient Treatment Team: Treatment Team: Attending Provider: [name], MD; Consulting Physician: Nolon Nations, MD; Consulting Physician: [name], MD; Registered Nurse: Hermina Staggers, RN; Technician: Loree Fee, NT; Consulting Physician: Michae Kava Lbcardiology, MD; Registered Nurse: Deatra Robinson Seay, RN  Subjective:  Feels better Had some nausea yesterday - not now.   Wife worried about an ulcer Pt denies pain    Objective:  Vital signs:  Vitals:   10/29/16 1343 10/29/16 2041 10/29/16 2124 10/30/16 0437  BP: 112/70 139/75  116/73  Pulse: (!) 101 (!) 119 (!) 104 92  Resp: 18 18 18 17   Temp: 97.7 F (36.5 C) 97.5 F (36.4 C)  97.5 F (36.4 C)  TempSrc: Oral Oral  Oral  SpO2: 96% 98% 97% 100%  Weight:      Height:        Last BM Date: 10/28/16  Intake/Output   Yesterday:  03/04 0701 - 03/05 0700 In: 3120 [P.O.:120; I.V.:3000] Out: -  This shift:  Total I/O In: 2120 [P.O.:120; I.V.:2000] Out: -   Bowel function:  Flatus: YES  BM:  YES  Drain: (No drain)   Physical Exam:  General: Pt awake/alert/oriented x4 in No acute distress Eyes: PERRL, normal EOM.  Sclera clear.  No icterus Neuro: CN II-XII intact w/o focal sensory/motor deficits. Lymph: No head/neck/groin lymphadenopathy Psych:  No delerium/psychosis/paranoia HENT: Normocephalic, Mucus membranes moist.  No thrush.  Remains HOH Neck: Supple, No tracheal deviation Chest: No chest wall pain w good excursion CV:  Pulses intact.  Regular rhythm MS: Normal AROM mjr joints.  No obvious deformity Abdomen: Soft.  Mildy distended.  Nontender.  No evidence of peritonitis.  No incarcerated hernias.  No ecchymosis Ext:  SCDs BLE.  No mjr edema.  No cyanosis Skin: No petechiae / purpura  Results:   Labs: Results for orders placed or performed during the hospital encounter of 10/27/16 (from the past 48 hour(s))  Hemoglobin and hematocrit, blood     Status: Abnormal   Collection Time: 10/28/16  3:36 PM  Result Value Ref Range   Hemoglobin 9.0 (L) 13.0 - 17.0 g/dL   HCT 27.8 (L) 39.0 - 52.0 %  CBC with Differential/Platelet     Status: Abnormal   Collection Time: 10/29/16  5:37 AM  Result Value Ref Range   WBC 11.9 (H) 4.0 - 10.5 K/uL   RBC 3.40 (L) 4.22 - 5.81 MIL/uL   Hemoglobin 9.3 (L) 13.0 - 17.0 g/dL   HCT 28.7 (L) 39.0 - 52.0 %   MCV 84.4 78.0 - 100.0 fL   MCH 27.4 26.0 - 34.0 pg   MCHC 32.4 30.0 - 36.0 g/dL    RDW 20.6 (H) 11.5 - 15.5 %   Platelets 270 150 - 400 K/uL   Neutrophils Relative % 83 %   Neutro Abs 9.8 (H) 1.7 - 7.7 K/uL   Lymphocytes Relative 7 %   Lymphs Abs 0.9 0.7 - 4.0 K/uL   Monocytes Relative 10 %   Monocytes Absolute 1.2 (H) 0.1 - 1.0 K/uL   Eosinophils Relative 0 %   Eosinophils Absolute 0.0 0.0 - 0.7 K/uL   Basophils Relative 0 %   Basophils Absolute 0.0 0.0 - 0.1 K/uL  Basic metabolic panel     Status: Abnormal   Collection Time: 10/29/16  5:37 AM  Result Value Ref Range   Sodium 138 135 - 145 mmol/L   Potassium 3.8 3.5 - 5.1 mmol/L   Chloride 106 101 - 111 mmol/L   CO2 24 22 - 32 mmol/L   Glucose, Bld 105 (H) 65 - 99 mg/dL   BUN 15 6 - 20 mg/dL   Creatinine, Ser 1.07 0.61 - 1.24 mg/dL   Calcium 8.1 (L) 8.9 - 10.3 mg/dL   GFR calc non Af Amer 60 (L) >60 mL/min   GFR calc Af Amer >60 >60 mL/min    Comment: (NOTE) The eGFR has been calculated using the CKD EPI equation. This calculation has not been validated in all clinical situations. eGFR's persistently <60 mL/min signify possible Chronic Kidney Disease.    Anion gap 8 5 - 15    Imaging / Studies: No results found.  Medications / Allergies: per chart  Antibiotics: Anti-infectives    None        Note: Portions of this report may have been transcribed using voice recognition software. Every effort was made to ensure accuracy; however, inadvertent computerized transcription errors may be present.   Any transcriptional errors that result from this process are unintentional.     Adin Hector, M.D., F.A.C.S. Gastrointestinal and Minimally Invasive Surgery [facility] [address], [place], [place] [address] [phone] Main / Paging   10/30/2016

## 2016-10-30 NOTE — Progress Notes (Signed)
Progress Note  Patient Name: Jason [name] Date of Encounter: 10/30/2016  Primary Cardiologist: [place]  Subjective   Feeling well this morning. No complaints.   Inpatient Medications    Scheduled Meds: . calcium-vitamin D  1 tablet Oral Daily  . dorzolamide  1 drop Both Eyes TID  . famotidine  20 mg Oral BID  . ferrous sulfate  325 mg Oral BID WC  . latanoprost  1 drop Both Eyes Daily  . metoprolol tartrate  37.5 mg Oral BID  . multivitamin with minerals  1 tablet Oral Daily  . perphenazine-amitriptyline  1 tablet Oral QHS  . prednisoLONE acetate  1 drop Right Eye Q1200  . rosuvastatin  5 mg Oral QHS  . vitamin B-12  1,000 mcg Oral Daily   Continuous Infusions:  PRN Meds: acetaminophen, alum & mag hydroxide-simeth, fluticasone, HYDROcodone-acetaminophen, iopamidol, lactated ringers, lactated ringers, ondansetron **OR** ondansetron (ZOFRAN) IV, traMADol, zolpidem   Vital Signs    Vitals:   10/29/16 1343 10/29/16 2041 10/29/16 2124 10/30/16 0437  BP: 112/70 139/75  116/73  Pulse: (!) 101 (!) 119 (!) 104 92  Resp: 18 18 18 17   Temp: 97.7 F (36.5 C) 97.5 F (36.4 C)  97.5 F (36.4 C)  TempSrc: Oral Oral  Oral  SpO2: 96% 98% 97% 100%  Weight:      Height:        Intake/Output Summary (Last 24 hours) at 10/30/16 1341 Last data filed at 10/30/16 0600  Gross per 24 hour  Intake             3120 ml  Output                0 ml  Net             3120 ml   Filed Weights   10/27/16 1602 10/28/16 1425  Weight: 190 lb (86.2 kg) 181 lb 6.4 oz (82.3 kg)    Telemetry    Afib, Fewer episodes of WCT - Personally Reviewed  ECG    Afib - Personally Reviewed  Physical Exam   General: Well developed, well nourished, male appearing in no acute distress. Head: Normocephalic, atraumatic.  Neck: Supple without bruits, JVD. Lungs:  Resp regular and unlabored, CTA. Heart: Irreg Irreg, S1, S2, no S3, S4, or murmur; no rub. Abdomen: Soft, non-tender, non-distended  with normoactive bowel sounds. No hepatomegaly. No rebound/guarding. No obvious abdominal masses. Extremities: No clubbing, cyanosis, edema. Distal pedal pulses are 2+ bilaterally. Neuro: Alert and oriented X 3. Moves all extremities spontaneously. Psych: Normal affect.  Labs    Chemistry Recent Labs Lab 10/27/16 1700 10/28/16 0520 10/29/16 0537  NA 133* 137 138  K 3.6 4.3 3.8  CL 100* 106 106  CO2 24 24 24   GLUCOSE 189* 100* 105*  BUN 17 17 15   CREATININE 1.56* 1.31* 1.07  CALCIUM 8.6* 8.5* 8.1*  PROT 6.0*  --   --   ALBUMIN 3.1*  --   --   AST 26  --   --   ALT 17  --   --   ALKPHOS 39  --   --   BILITOT 0.6  --   --   GFRNONAA 38* 47* 60*  GFRAA 44* 54* >60  ANIONGAP 9 7 8      Hematology Recent Labs Lab 10/27/16 1700 10/28/16 0520 10/28/16 1536 10/29/16 0537  WBC 11.4* 12.6*  --  11.9*  RBC 2.45* 2.82*  --  3.40*  HGB  6.0* 7.4* 9.0* 9.3*  HCT 19.3* 22.9* 27.8* 28.7*  MCV 78.8 81.2  --  84.4  MCH 24.5* 26.2  --  27.4  MCHC 31.1 32.3  --  32.4  RDW 19.8* 19.2*  --  20.6*  PLT 325 269  --  270    Cardiac EnzymesNo results for input(s): TROPONINI in the last 168 hours.  Recent Labs Lab 10/27/16 1704  TROPIPOC 0.01     BNPNo results for input(s): BNP, PROBNP in the last 168 hours.   DDimer No results for input(s): DDIMER in the last 168 hours.    Radiology    No results found.  Cardiac Studies   N/A  Patient Profile     83 y.o. male  with PMH of CAD s/p CABG ('11), prosthetic AV, HTN and permanent Afib who presented with lower GI bleeding and developed Afib RVR.   Assessment & Plan    1. Permanent Afib: Developed rapid rates with episodes of WCT. Appear to be aberrancy but concern for VT as well. Metoprolol was increased to 37.5mg  BID yesterday and episodes have slowed. Likely related to his GI illness.  -- coumadin on hold 2/2 GI surgery with proximal right colectomy (2/27), and then admitted with GI bleeding.  2. CAD: Status post CABG.  Currently not on antiplatelets as he is having GI bleeding. No current chest pain.  3. Prosthetic aortic valve: Does have a murmur, but without shortness of breath. Continue current management.  4.Cecal Ca: Surgery following. Underwent proximal colectomy 2/27. Coumadin has been held since surgery. Would resume once stable from a surgery stand point.   Signed, Reino Bellis, NP  10/30/2016, 1:41 PM    Attending Note:   The patient was seen and examined.  Agree with assessment and plan as noted above.  Changes made to the above note as needed.  Patient seen and independently examined with Reino Bellis , NP .   We discussed all aspects of the encounter. I agree with the assessment and plan as stated above.  1. Colon cancer, with GI bleed afterwards likely due to to anastomosis bleed.  He has atrial fib and has a bioprosthetic AV  We should think about restarting coumadin soon - will defer to surgery as to the timing of that.  2.   Wide complex tachycardia: Aberrancy vs. NSVT Seems better with higher dose of metoprolol  3. AVR:   Stable    I have spent a total of 30 minutes with patient reviewing hospital  notes , telemetry, EKGs, labs and examining patient as well as establishing an assessment and plan that was discussed with the patient. > 50% of time was spent in direct patient care.    Thayer Headings, [name] Bonito., MD, [facility] 10/30/2016, 2:01 PM [id number] N. [address]  [place] Pager [phone]

## 2016-10-31 DIAGNOSIS — Z7901 Long term (current) use of anticoagulants: Secondary | ICD-10-CM

## 2016-10-31 LAB — CBC
HCT: 29.5 % — ABNORMAL LOW (ref 39.0–52.0)
Hemoglobin: 9.5 g/dL — ABNORMAL LOW (ref 13.0–17.0)
MCH: 27.7 pg (ref 26.0–34.0)
MCHC: 32.2 g/dL (ref 30.0–36.0)
MCV: 86 fL (ref 78.0–100.0)
PLATELETS: 255 10*3/uL (ref 150–400)
RBC: 3.43 MIL/uL — AB (ref 4.22–5.81)
RDW: 23 % — ABNORMAL HIGH (ref 11.5–15.5)
WBC: 8.7 10*3/uL (ref 4.0–10.5)

## 2016-10-31 LAB — TYPE AND SCREEN
ABO/RH(D): A NEG
Antibody Screen: NEGATIVE
UNIT DIVISION: 0
UNIT DIVISION: 0
UNIT DIVISION: 0
UNIT DIVISION: 0
Unit division: 0

## 2016-10-31 LAB — BASIC METABOLIC PANEL
Anion gap: 7 (ref 5–15)
BUN: 11 mg/dL (ref 6–20)
CALCIUM: 8.1 mg/dL — AB (ref 8.9–10.3)
CHLORIDE: 103 mmol/L (ref 101–111)
CO2: 25 mmol/L (ref 22–32)
CREATININE: 0.83 mg/dL (ref 0.61–1.24)
GFR calc non Af Amer: >60 mL/min (ref >60)
Glucose, Bld: 84 mg/dL (ref 65–99)
Potassium: 3.6 mmol/L (ref 3.5–5.1)
SODIUM: 135 mmol/L (ref 135–145)

## 2016-10-31 LAB — BPAM RBC
BLOOD PRODUCT EXPIRATION DATE: 201804032359
Blood Product Expiration Date: 201803282359
Blood Product Expiration Date: 201804032359
Blood Product Expiration Date: 201804032359
Blood Product Expiration Date: 201804032359
ISSUE DATE / TIME: 201803021952
ISSUE DATE / TIME: 201803030022
ISSUE DATE / TIME: 201803030810
UNIT TYPE AND RH: 600
Unit Type and Rh: 600
Unit Type and Rh: 600
Unit Type and Rh: 600
Unit Type and Rh: 6200

## 2016-10-31 MED ORDER — ENOXAPARIN SODIUM 40 MG/0.4ML ~~LOC~~ SOLN
40.0000 mg | SUBCUTANEOUS | Status: DC
  Administered 2016-10-31: 40 mg via SUBCUTANEOUS
  Filled 2016-10-31: qty 0.4

## 2016-10-31 MED ORDER — SIMETHICONE 80 MG PO CHEW: 80.0000 mg | CHEWABLE_TABLET | Freq: Four times a day (QID) | ORAL | Status: DC | PRN

## 2016-10-31 MED ORDER — ALUM & MAG HYDROXIDE-SIMETH 200-200-20 MG/5ML PO SUSP
30.0000 mL | Freq: Four times a day (QID) | ORAL | Status: DC | PRN
  Administered 2016-10-31: 30 mL via ORAL
  Filled 2016-10-31: qty 30

## 2016-10-31 MED ORDER — PANTOPRAZOLE SODIUM 40 MG PO TBEC
40.0000 mg | DELAYED_RELEASE_TABLET | Freq: Every day | ORAL | Status: DC
  Administered 2016-10-31: 40 mg via ORAL
  Filled 2016-10-31: qty 1

## 2016-10-31 MED ORDER — PSYLLIUM 95 % PO PACK
1.0000 | PACK | Freq: Every day | ORAL | Status: DC
  Administered 2016-10-31 – 2016-11-01 (×2): 1 via ORAL
  Filled 2016-10-31 (×2): qty 1

## 2016-10-31 MED ORDER — ENOXAPARIN SODIUM 30 MG/0.3ML ~~LOC~~ SOLN: 30.0000 mg | Freq: Two times a day (BID) | SUBCUTANEOUS | Status: DC

## 2016-10-31 NOTE — Progress Notes (Signed)
Progress Note  Patient Name: Jason [name] Date of Encounter: 10/31/2016  Primary Cardiologist: [place]  Subjective   Feeling well this morning. No complaints.   Inpatient Medications    Scheduled Meds: . perphenazine  4 mg Oral QHS   And  . amitriptyline  25 mg Oral QHS  . calcium-vitamin D  1 tablet Oral Daily  . dorzolamide  1 drop Both Eyes TID  . enoxaparin (LOVENOX) injection  40 mg Subcutaneous Q24H  . famotidine  20 mg Oral BID  . ferrous sulfate  325 mg Oral BID WC  . latanoprost  1 drop Both Eyes Daily  . metoprolol tartrate  37.5 mg Oral BID  . multivitamin with minerals  1 tablet Oral Daily  . pantoprazole  40 mg Oral Q1200  . prednisoLONE acetate  1 drop Right Eye Q1200  . psyllium  1 packet Oral Daily  . rosuvastatin  5 mg Oral QHS  . vitamin B-12  1,000 mcg Oral Daily   Continuous Infusions:  PRN Meds: acetaminophen, alum & mag hydroxide-simeth, fluticasone, HYDROcodone-acetaminophen, iopamidol, lactated ringers, ondansetron **OR** ondansetron (ZOFRAN) IV, simethicone, traMADol, zolpidem   Vital Signs    Vitals:   10/30/16 0437 10/30/16 1700 10/30/16 2201 10/31/16 0533  BP: 116/73 126/63  110/61  Pulse: 92 88 79 78  Resp: 17 18 18 18   Temp: 97.5 F (36.4 C) 98.7 F (37.1 C)  98.4 F (36.9 C)  TempSrc: Oral Oral  Oral  SpO2: 100% 98% 99% 100%  Weight:      Height:        Intake/Output Summary (Last 24 hours) at 10/31/16 1133 Last data filed at 10/31/16 0109  Gross per 24 hour  Intake              480 ml  Output              300 ml  Net              180 ml   Filed Weights   10/27/16 1602 10/28/16 1425  Weight: 190 lb (86.2 kg) 181 lb 6.4 oz (82.3 kg)    Telemetry    Afib, Fewer episodes of WCT - Personally Reviewed  ECG    Afib - Personally Reviewed  Physical Exam   General: Well developed, well nourished, male appearing in no acute distress. Head: Normocephalic, atraumatic.  Neck: Supple without bruits, JVD. Lungs:  Resp  regular and unlabored, CTA. Heart: Irreg Irreg, S1, S2, no S3, S4, or murmur; no rub. Abdomen: Soft, non-tender, non-distended with normoactive bowel sounds. No hepatomegaly. No rebound/guarding. No obvious abdominal masses. Extremities: No clubbing, cyanosis, edema. Distal pedal pulses are 2+ bilaterally. Neuro: Alert and oriented X 3. Moves all extremities spontaneously. Psych: Normal affect.  Labs    Chemistry  Recent Labs Lab 10/27/16 1700 10/28/16 0520 10/29/16 0537 10/31/16 0603  NA 133* 137 138 135  K 3.6 4.3 3.8 3.6  CL 100* 106 106 103  CO2 24 24 24 25   GLUCOSE 189* 100* 105* 84  BUN 17 17 15 11   CREATININE 1.56* 1.31* 1.07 0.83  CALCIUM 8.6* 8.5* 8.1* 8.1*  PROT 6.0*  --   --   --   ALBUMIN 3.1*  --   --   --   AST 26  --   --   --   ALT 17  --   --   --   ALKPHOS 39  --   --   --  BILITOT 0.6  --   --   --   GFRNONAA 38* 47* 60* >60  GFRAA 44* 54* >60 >60  ANIONGAP 9 7 8 7      Hematology  Recent Labs Lab 10/28/16 0520 10/28/16 1536 10/29/16 0537 10/30/16 1422 10/31/16 0603  WBC 12.6*  --  11.9*  --  8.7  RBC 2.82*  --  3.40*  --  3.43*  HGB 7.4* 9.0* 9.3* 9.3* 9.5*  HCT 22.9* 27.8* 28.7*  --  29.5*  MCV 81.2  --  84.4  --  86.0  MCH 26.2  --  27.4  --  27.7  MCHC 32.3  --  32.4  --  32.2  RDW 19.2*  --  20.6*  --  23.0*  PLT 269  --  270  --  255    Cardiac EnzymesNo results for input(s): TROPONINI in the last 168 hours.   Recent Labs Lab 10/27/16 1704  TROPIPOC 0.01     BNPNo results for input(s): BNP, PROBNP in the last 168 hours.   DDimer No results for input(s): DDIMER in the last 168 hours.    Radiology    No results found.  Cardiac Studies   N/A  Patient Profile     83 y.o. male  with PMH of CAD s/p CABG ('11), prosthetic AV, HTN and permanent Afib who presented with lower GI bleeding and developed Afib RVR.   Assessment & Plan    1. Permanent Afib: Developed rapid rates with episodes of WCT. Appear to be aberrancy  but concern for VT as well. Metoprolol was increased to 37.5mg  BID yesterday and episodes have slowed. Likely related to his GI illness.  -- coumadin on hold 2/2 GI surgery with proximal right colectomy (2/27), and then admitted with GI bleeding.  Agree with plan to restart coumadin in tomorrow  if he remains stable He is on Lovenox. I would send him home with at least 3-4 days of Lovenox bridging while he is getting theraputic on coumadin.  Pharmacy to dose Lovenox.  His coumadin is managed by Dr. Joylene Draft and the Medical Resource Manager - further recommendations per MRM once hes at home.   2. CAD: Status post CABG. Currently not on antiplatelets as he is having GI bleeding. No current chest pain.  3. Prosthetic aortic valve: Does have a murmur, but without shortness of breath. Continue current management.  4.Cecal Ca: Surgery following. Underwent proximal colectomy 2/27. Coumadin has been held since surgery. Would resume once stable from a surgery stand point.   Will sign off. Call for questions     Mertie Moores, MD  10/31/2016 11:41 AM    [address],  [place], [place]  [address] Pager [phone] Phone: [phone]; Fax: [phone]

## 2016-10-31 NOTE — Progress Notes (Addendum)
[facility]  [address] [place], [place] [id number] Phone: [phone] FAX: [place] [id number] May 08, 1926    Problem List:   Principal Problem:   Lower GI bleed Active Problems:   Cecal cancer s/p robotic proximal colectomy 10/24/2016   Anxiety state   Iron deficiency anemia due to chronic blood loss   Symptomatic anemia   Chronic anticoagulation   HOH (hard of hearing)   Postoperative anemia due to acute blood loss      10/24/2016  POST-OPERATIVE DIAGNOSIS:  Cecal cancer  PROCEDURE:  XI ROBOT PROXIMAL RIGHT COLECTOMY    SURGEON:  Adin Hector, MD    Assessment  Recovering  Plan:  -adv diet to [facility] diet -Afib/HTN control - cardiology help appreciated -hold IVF w backup IVF boluses -anemia - acute postop on chronic - Hgb improved s/p transfusion.  Transfuse if worse or Sx. Restart LMWH today & follow.  If Hgb stable > 48 & no GI bleeding, OK to restart warfarin -H2B - add PPI for now -fiber & iron bowel regimen -simethicone PRN -VTE prophylaxis- SCDs, etc -mobilize as tolerated to help recovery  I updated the patient's status to the patient and spouse.  Recommendations were made.  Questions were answered.  They expressed understanding & appreciation.   Adin Hector, M.D., F.A.C.S. Gastrointestinal and Minimally Invasive Surgery [facility] [address], [place], [place] [address] [phone] Main / Paging   10/31/2016  CARE TEAM:  PCP: Jerlyn Ly, MD  Outpatient Care Team: Patient Care Team: [name], MD as PCP - General (Internal Medicine) [name], MD as Consulting Physician (General Surgery) [name], MD as Consulting Physician (Gastroenterology) [name], MD as Consulting Physician (Cardiology) Rana Snare, MD as Consulting Physician (Urology)  Inpatient Treatment Team: Treatment Team: Attending Provider:  [name], MD; Consulting Physician: [name], MD; Technician: Loree Fee, NT; Consulting Physician: Michae Kava Lbcardiology, MD; Technician: Kirt Boys, NT; Registered Nurse: Deatra Robinson Seay, RN  Subjective:  Feels better Had some nausea yesterday - not now.   Walking & sitting up well Wife less concerned today Pt denies pain   Objective:  Vital signs:  Vitals:   10/30/16 0437 10/30/16 1700 10/30/16 2201 10/31/16 0533  BP: 116/73 126/63  110/61  Pulse: 92 88 79 78  Resp: 17 18 18 18   Temp: 97.5 F (36.4 C) 98.7 F (37.1 C)  98.4 F (36.9 C)  TempSrc: Oral Oral  Oral  SpO2: 100% 98% 99% 100%  Weight:      Height:        Last BM Date: 10/30/16  Intake/Output   Yesterday:  03/05 0701 - 03/06 0700 In: 720 [P.O.:720] Out: 300 [Urine:300] This shift:  No intake/output data recorded.  Bowel function:  Flatus: YES  BM:  YES  Drain: (No drain)   Physical Exam:  General: Pt awake/alert/oriented x4 in No acute distress Eyes: PERRL, normal EOM.  Sclera clear.  No icterus Neuro: CN II-XII intact w/o focal sensory/motor deficits. Lymph: No head/neck/groin lymphadenopathy Psych:  No delerium/psychosis/paranoia HENT: Normocephalic, Mucus membranes moist.  No thrush.  Remains HOH Neck: Supple, No tracheal deviation Chest: No chest wall pain w good excursion CV:  Pulses intact.  Regular rhythm MS: Normal AROM mjr joints.  No obvious deformity Abdomen: Soft.  Mildy distended.  Nontender.  No evidence of peritonitis.  No incarcerated hernias.  No ecchymosis Ext:  SCDs  BLE.  No mjr edema.  No cyanosis Skin: No petechiae / purpura  Results:   Labs: Results for orders placed or performed during the hospital encounter of 10/27/16 (from the past 48 hour(s))  Hemoglobin     Status: Abnormal   Collection Time: 10/30/16  2:22 PM  Result Value Ref Range   Hemoglobin 9.3 (L) 13.0 - 17.0 g/dL    Imaging / Studies: No results found.  Medications /  Allergies: per chart  Antibiotics: Anti-infectives    None        Note: Portions of this report may have been transcribed using voice recognition software. Every effort was made to ensure accuracy; however, inadvertent computerized transcription errors may be present.   Any transcriptional errors that result from this process are unintentional.     Adin Hector, M.D., F.A.C.S. Gastrointestinal and Minimally Invasive Surgery [facility] [address] [place], [place] [address] [phone] Main / Paging   10/31/2016

## 2016-11-01 DIAGNOSIS — C180 Malignant neoplasm of cecum: Principal | ICD-10-CM

## 2016-11-01 DIAGNOSIS — D62 Acute posthemorrhagic anemia: Secondary | ICD-10-CM

## 2016-11-01 LAB — CBC
HCT: 28.4 % — ABNORMAL LOW (ref 39.0–52.0)
Hemoglobin: 9.2 g/dL — ABNORMAL LOW (ref 13.0–17.0)
MCH: 27.8 pg (ref 26.0–34.0)
MCHC: 32.4 g/dL (ref 30.0–36.0)
MCV: 85.8 fL (ref 78.0–100.0)
PLATELETS: 238 10*3/uL (ref 150–400)
RBC: 3.31 MIL/uL — ABNORMAL LOW (ref 4.22–5.81)
RDW: 22.8 % — ABNORMAL HIGH (ref 11.5–15.5)
WBC: 8.3 10*3/uL (ref 4.0–10.5)

## 2016-11-01 LAB — BASIC METABOLIC PANEL
Anion gap: 6 (ref 5–15)
BUN: 10 mg/dL (ref 6–20)
CALCIUM: 8 mg/dL — AB (ref 8.9–10.3)
CO2: 25 mmol/L (ref 22–32)
CREATININE: 0.79 mg/dL (ref 0.61–1.24)
Chloride: 104 mmol/L (ref 101–111)
GFR calc Af Amer: >60 mL/min (ref >60)
GFR calc non Af Amer: >60 mL/min (ref >60)
GLUCOSE: 89 mg/dL (ref 65–99)
Potassium: 3.3 mmol/L — ABNORMAL LOW (ref 3.5–5.1)
Sodium: 135 mmol/L (ref 135–145)

## 2016-11-01 MED ORDER — ENOXAPARIN SODIUM 80 MG/0.8ML ~~LOC~~ SOLN: 1.0000 mg/kg | Freq: Two times a day (BID) | SUBCUTANEOUS | 0 refills | Status: DC

## 2016-11-01 MED ORDER — ENOXAPARIN SODIUM 80 MG/0.8ML ~~LOC~~ SOLN
80.0000 mg | Freq: Two times a day (BID) | SUBCUTANEOUS | Status: DC
  Administered 2016-11-01: 80 mg via SUBCUTANEOUS
  Filled 2016-11-01: qty 0.8

## 2016-11-01 MED ORDER — ENOXAPARIN (LOVENOX) PATIENT EDUCATION KIT
PACK | Freq: Once | Status: AC
  Administered 2016-11-01: 09:00:00
  Filled 2016-11-01: qty 1

## 2016-11-01 MED ORDER — ENOXAPARIN (LOVENOX) PATIENT EDUCATION KIT: 1.0000 | PACK | Freq: Once | 0 refills | Status: AC

## 2016-11-01 NOTE — Progress Notes (Signed)
Went over discharge information with patient and family.  Discharge paperwork and prescription given to wife.  Explained how to give lovenox shots with teachback.  Pt and wife had no further questions.  VSS.  Pt wheeled out by NT.

## 2016-11-01 NOTE — Progress Notes (Signed)
    I have talked to Juliann Pulse ( Pharm D at [facility], Scientist, product/process development).  They have been managing his anticoagulation Pt was admitted with cecal cancer and is s/p hemicolectomy .  Doing well from our standpoint  She is aware that Mr. Tully is being [facility] today on coumadin and 2 days of Lovenox. She will make further arrangements to have Mr. Nay follow up with them for INR measurements.   OK for DC from my standpoint He is stable from a CV standpoint     Mertie Moores, MD  11/01/2016 8:36 AM    [address] [place],  [place], [place]  [address] Pager [phone] Phone: [phone]; Fax: [phone]

## 2016-11-01 NOTE — Discharge Summary (Addendum)
Physician Discharge Summary  Patient ID: Jason [name] MRN: [id number] DOB/AGE: 31-May-1926  83 y.o.  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  Did well and went on postoperative day eight.  Then had bloody bowel movements.  Symptomatically.  Came emergency room.  Hemoglobin six.  Transfuse.  Anticoagulation held.  The patient gradually mobilized and advanced to a solid diet.  Pain and other symptoms were treated aggressively.   Hemoglobin remained stable in the nines for 48 hours.  Cardiology L follow and adjust of metoprolol given his mild tachycardia with atrial fibrillation.  By the time of discharge, the patient was walking well the hallways, eating food, having flatus.  Pain was well-controlled on an oral medications.  Based on meeting discharge criteria and continuing to recover, I felt it was safe for the patient to be discharged from the hospital to further recover with close followup.  Cardiology recommended Lovenox bridge.  Asked Dr. nausea.  He last pharmacy to help arrange.  Patient to follow-up with primary care physician, Dr. Joylene Draft to check ProTime/INR in  the future.  Patient's case discussed at GI tumor Board.  Most likely benefit from post-adjuvant Xeloda given stage III lymph node positive cancer and pretty good exercise tolerance/functionality.  That may be a challenge with the patient on warfarin.  We will see if different anticoagulation or chemotherapy can be given.  Let medical oncology and cardiology discuss.  Postoperative recommendations were discussed in detail.  They are written as well.  Discharged Condition: good  Disposition:  Follow-up Information    Rosemary Pentecost C., MD. Schedule an appointment as soon as possible for a visit in 2 day(s).   Specialty:  General Surgery Why:  To follow up after colon surgery Contact information: [place] [address] [phone]        Jerlyn Ly, MD. Schedule an appointment as soon as possible for a visit in 5 day(s).   Specialty:  Internal Medicine Why:  To have PT/INR blood thinner level checked Contact information: [place] [address] [phone]           01-Home or Self Care  Discharge Instructions    Call MD for:    Complete by:  As directed    FEVER > 101.5 F  (temperatures < 101.5 F are not significant)   Call MD for:  extreme fatigue    Complete by:  As directed    Call MD for:  persistant dizziness or light-headedness    Complete by:  As directed    Call MD for:  persistant nausea and vomiting    Complete by:  As directed    Call MD for:  redness, tenderness, or signs of infection (pain, swelling, redness, odor or green/yellow discharge around incision site)    Complete by:  As directed    Call MD for:  severe uncontrolled pain    Complete by:  As directed    Diet - low sodium heart healthy    Complete by:  As directed    Follow a light diet the first few days at home.   Start with a bland diet such as soups, liquids, starchy foods, low fat foods, etc.   If you feel full, bloated, or constipated, stay on a  full liquid or pureed/blenderized diet for a few days until you feel better and no longer constipated. Be sure to drink plenty of fluids every day to avoid getting dehydrated (feeling dizzy, not urinating, etc.). Gradually add a fiber supplement to your diet   Discharge instructions    Complete by:  As directed    See Discharge Instructions If you are not getting better after two weeks or are noticing you are getting worse, contact our office [phone] for further advice.  We may need to adjust your medications, re-evaluate you in the office, send you to the emergency room, or see what other things we can do to help. The clinic staff is available to answer your questions during regular business hours (8:30am-5pm).  Please don't hesitate to call and ask to speak to one of our nurses for clinical concerns.    A surgeon from [facility] is always on call at the hospitals 24 hours/day If you have a medical emergency, go to the nearest emergency room or call 911.   Driving Restrictions    Complete by:  As directed    You may drive when you are no longer taking narcotic prescription pain medication, you can comfortably wear a seatbelt, and you can safely make sudden turns/stops to protect yourself without hesitating due to pain.   Increase activity slowly    Complete by:  As directed    Start light daily activities --- self-care, walking, climbing stairs- beginning the day after surgery.  Gradually increase activities as tolerated.  Control your pain to be active.  Stop when you are tired.  Ideally, walk several times a day, eventually an hour a day.   Most people are back to most day-to-day activities in a few weeks.  It takes 4-8 weeks to get back to unrestricted, intense activity. If you can walk 30 minutes without difficulty, it is safe to try more intense activity such as jogging, treadmill, bicycling, low-impact aerobics, swimming, etc. Save the most intensive and strenuous  activity for last (Usually 4-8 weeks after surgery) such as sit-ups, heavy lifting, contact sports, etc.  Refrain from any intense heavy lifting or straining until you are off narcotics for pain control.  You will have off days, but things should improve week-by-week. DO NOT PUSH THROUGH PAIN.  Let pain be your guide: If it hurts to do something, don't do it.  Pain is your body warning you to avoid that activity for another week until the pain goes down.   Lifting restrictions    Complete by:  As directed    If you can walk 30 minutes without difficulty, it is safe to try more intense activity such as jogging, treadmill, bicycling, low-impact aerobics, swimming, etc. Save the most intensive and strenuous activity for last (Usually 4-8 weeks after surgery) such as sit-ups, heavy lifting,  contact sports, etc.  Refrain from any intense heavy lifting or straining until you are off narcotics for pain control.  You will have off days, but things should improve week-by-week. DO NOT PUSH THROUGH PAIN.  Let pain be your guide: If it hurts to do something, don't do it.  Pain is your body warning you to avoid that activity for another week until the pain goes down.   May walk up steps    Complete by:  As directed    No wound care    Complete by:  As directed    It is good for closed incision and even open wounds to be washed every day.  Shower every day.  Short baths are fine.  Wash the incisions and wounds clean with soap & water.    If you have a closed incision(s), wash the incision with soap & water every day.  You may leave closed incisions open to air if it is dry.   You may cover the incision with clean gauze & replace it after your daily shower for comfort. If you have skin tapes (Steristrips) or skin glue (Dermabond) on your incision, leave them in place.  They will fall off on their own like a scab.  You may trim any edges that curl up with clean scissors.  If you have staples, set up an appointment for  them to be removed in the office in 10 days after surgery.  If you have a drain, wash around the skin exit site with soap & water and place a new dressing of gauze or band aid around the skin every day.  Keep the drain site clean & dry.   Sexual Activity Restrictions    Complete by:  As directed    You may have sexual intercourse when it is comfortable. If it hurts to do something, stop.      Allergies as of 11/01/2016   No Known Allergies     Medication List    STOP taking these medications   senna-docusate 8.6-50 MG tablet Commonly known as:  Senokot-S     TAKE these medications   acetaminophen 500 MG tablet Commonly known as:  TYLENOL Take 500 mg by mouth every 6 (six) hours as needed for moderate pain.   calcium-vitamin D 500-200 MG-UNIT tablet Take 1 tablet by mouth daily.   Cholecalciferol 1000 units capsule Take 1,000 Units by mouth daily.   dorzolamide 2 % ophthalmic solution Commonly known as:  TRUSOPT PLACE 1 DROP INTO BOTH EYES 3 TIMES DAILY.   fluticasone 50 MCG/ACT nasal spray Commonly known as:  FLONASE Place 1 spray into both nostrils daily as needed for allergies or rhinitis.   glucosamine-chondroitin 500-400 MG tablet Take 1 tablet by mouth 2 (two) times daily.   latanoprost 0.005 % ophthalmic solution Commonly known as:  XALATAN Place 1 drop into both eyes daily.   metoprolol tartrate 25 MG tablet Commonly known as:  LOPRESSOR Take 1 tablet (25 mg total) by mouth daily. What changed:  when to take this   multivitamin with minerals Tabs tablet Take 1 tablet by mouth daily.   NON FORMULARY Inhale 1 application into the lungs at bedtime. CPAP for apnea   omega-3 acid ethyl esters 1 g capsule Commonly known as:  LOVAZA Take 1 g by mouth daily.   perphenazine-amitriptyline 4-25 MG Tabs tablet Commonly known as:  ETRAFON/TRIAVIL Take 1 tablet by mouth at bedtime.   prednisoLONE acetate 1 % ophthalmic suspension Commonly known  as:  PRED  FORTE Place 1 drop into the right eye daily at 12 noon.   ranitidine 150 MG tablet Commonly known as:  ZANTAC Take 150 mg by mouth daily.   rosuvastatin 5 MG tablet Commonly known as:  CRESTOR Take 5 mg by mouth at bedtime.   traMADol 50 MG tablet Commonly known as:  ULTRAM Take 1-2 tablets (50-100 mg total) by mouth every 6 (six) hours as needed for moderate pain or severe pain.   vitamin B-12 1000 MCG tablet Commonly known as:  CYANOCOBALAMIN Take 1,000 mcg by mouth daily.   warfarin 5 MG tablet Commonly known as:  COUMADIN Take 2.5-5 mg by mouth daily at 6 PM. Takes 1 tablet(79m) daily except 1/2 tablet(2.513m on Mondays and Thursdays.       Significant Diagnostic Studies:  Results for orders placed or performed during the hospital encounter of 10/27/16 (from the past 72 hour(s))  Hemoglobin     Status: Abnormal   Collection Time: 10/30/16  2:22 PM  Result Value Ref Range   Hemoglobin 9.3 (L) 13.0 - 17.0 g/dL  Basic metabolic panel     Status: Abnormal   Collection Time: 10/31/16  6:03 AM  Result Value Ref Range   Sodium 135 135 - 145 mmol/L   Potassium 3.6 3.5 - 5.1 mmol/L   Chloride 103 101 - 111 mmol/L   CO2 25 22 - 32 mmol/L   Glucose, Bld 84 65 - 99 mg/dL   BUN 11 6 - 20 mg/dL   Creatinine, Ser 0.83 0.61 - 1.24 mg/dL   Calcium 8.1 (L) 8.9 - 10.3 mg/dL   GFR calc non Af Amer >60 >60 mL/min   GFR calc Af Amer >60 >60 mL/min    Comment: (NOTE) The eGFR has been calculated using the CKD EPI equation. This calculation has not been validated in all clinical situations. eGFR's persistently <60 mL/min signify possible Chronic Kidney Disease.    Anion gap 7 5 - 15  CBC     Status: Abnormal   Collection Time: 10/31/16  6:03 AM  Result Value Ref Range   WBC 8.7 4.0 - 10.5 K/uL   RBC 3.43 (L) 4.22 - 5.81 MIL/uL   Hemoglobin 9.5 (L) 13.0 - 17.0 g/dL   HCT 29.5 (L) 39.0 - 52.0 %   MCV 86.0 78.0 - 100.0 fL   MCH 27.7 26.0 - 34.0 pg   MCHC 32.2 30.0 - 36.0 g/dL    RDW 23.0 (H) 11.5 - 15.5 %   Platelets 255 150 - 400 K/uL  Basic metabolic panel     Status: Abnormal   Collection Time: 11/01/16  5:48 AM  Result Value Ref Range   Sodium 135 135 - 145 mmol/L   Potassium 3.3 (L) 3.5 - 5.1 mmol/L   Chloride 104 101 - 111 mmol/L   CO2 25 22 - 32 mmol/L   Glucose, Bld 89 65 - 99 mg/dL   BUN 10 6 - 20 mg/dL   Creatinine, Ser 0.79 0.61 - 1.24 mg/dL   Calcium 8.0 (L) 8.9 - 10.3 mg/dL   GFR calc non Af Amer >60 >60 mL/min   GFR calc Af Amer >60 >60 mL/min    Comment: (NOTE) The eGFR has been calculated using the CKD EPI equation. This calculation has not been validated in all clinical situations. eGFR's persistently <60 mL/min signify possible Chronic Kidney Disease.    Anion gap 6 5 - 15  CBC     Status: Abnormal  Collection Time: 11/01/16  5:48 AM  Result Value Ref Range   WBC 8.3 4.0 - 10.5 K/uL   RBC 3.31 (L) 4.22 - 5.81 MIL/uL   Hemoglobin 9.2 (L) 13.0 - 17.0 g/dL   HCT 28.4 (L) 39.0 - 52.0 %   MCV 85.8 78.0 - 100.0 fL   MCH 27.8 26.0 - 34.0 pg   MCHC 32.4 30.0 - 36.0 g/dL   RDW 22.8 (H) 11.5 - 15.5 %   Platelets 238 150 - 400 K/uL    No results found.  Discharge Exam: Blood pressure (!) 108/59, pulse 83, temperature 98.3 F (36.8 C), temperature source Oral, resp. rate 16, height _0  (1.803 m), weight 82.3 kg (181 lb 6.4 oz), SpO2 100 %.  General: Pt awake/alert/oriented x4 in No acute distress Eyes: PERRL, normal EOM.  Sclera clear.  No icterus Neuro: CN II-XII intact w/o focal sensory/motor deficits. Lymph: No head/neck/groin lymphadenopathy Psych:  No delerium/psychosis/paranoia HENT: Normocephalic, Mucus membranes moist.  No thrush Neck: Supple, No tracheal deviation Chest: No chest wall pain w good excursion CV:  Pulses intact.  Regular rhythm MS: Normal AROM mjr joints.  No obvious deformity Abdomen: Soft.  Nondistended.  Nontender.  Incisions closed & clean/dry.  No evidence of peritonitis.  No incarcerated  hernias. Ext:  SCDs BLE.  No mjr edema.  No cyanosis Skin: No petechiae / purpura  Past Medical History:  Diagnosis Date  . Abnormal PSA   . Adenomatous polyp of colon 2010 and before 2006  . Anemia   . Aortic stenosis   . Aortic valve prosthesis present   . Atrial fibrillation ([place])   . CAD (coronary artery disease)   . DDD (degenerative disc disease), lumbar   . Depression   . Diverticulosis   . Dyslipidemia   . GERD (gastroesophageal reflux disease)   . Glaucoma   . History of blood transfusion 09/29/2016  . Hx of CABG   . Hypogonadism, male   . Macular degeneration   . OSA (obstructive sleep apnea)   . Osteoarthritis   . Osteopenia   . Prostate CA [facility]) prostate 2007   colon dx 2018, watching psa levels, no tx yet for prostate    Past Surgical History:  Procedure Laterality Date  . AORTIC VALVE REPLACEMENT  October 2011   Magna Ease pericardial tissue valve #36m  . BDavis  lower  . CATARACT EXTRACTION Bilateral   . CORNEAL TRANSPLANT Right   . CORONARY ARTERY BYPASS GRAFT  October 2011   LIMA to LAD  . ESOPHAGOGASTRODUODENOSCOPY N/A 09/16/2014   Procedure: ESOPHAGOGASTRODUODENOSCOPY (EGD);  Surgeon: JIrene Shipper MD;  [facility];  Service: Endoscopy;  Laterality: N/A;  . TONSILLECTOMY  76 yrs ago    Social History   Social History  . Marital status: Married    Spouse name: NIzora Gala . Number of children: 2  . Years of education: N/A   Occupational History  . Not on file.   Social History Main Topics  . Smoking status: Former Smoker    Packs/day: 1.00    Years: 40.00    Types: Cigarettes    Quit date: 11/16/1990  . Smokeless tobacco: Never Used  . Alcohol use 1.2 oz/week    2 Glasses of wine per week     Comment: wine few x per week  . Drug use: No  . Sexual activity: Not on file   Other Topics Concern  . Not on file  Social History Narrative  . No narrative on file    Family History  Problem Relation Age of Onset   . Heart attack Father   . Colon cancer Neg Hx     Current Facility-Administered Medications  Medication Dose Route Frequency Provider Last Rate Last Dose  . acetaminophen (TYLENOL) tablet 500-1,000 mg  500-1,000 mg Oral Q6H PRN [name], MD      . alum & mag hydroxide-simeth (MAALOX/MYLANTA) 200-200-20 MG/5ML suspension 30 mL  30 mL Oral Q6H PRN [name], MD   30 mL at 10/31/16 1840  . perphenazine (TRILAFON) tablet 4 mg  4 mg Oral QHS [name], MD   4 mg at 10/31/16 2051   And  . amitriptyline (ELAVIL) tablet 25 mg  25 mg Oral QHS [name], MD   25 mg at 10/31/16 2051  . calcium-vitamin D (OSCAL WITH D) 500-200 MG-UNIT per tablet 1 tablet  1 tablet Oral Daily [name], MD   1 tablet at 10/31/16 0857  . dorzolamide (TRUSOPT) 2 % ophthalmic solution 1 drop  1 drop Both Eyes TID [name], MD   1 drop at 10/31/16 2051  . enoxaparin (LOVENOX) injection 40 mg  40 mg Subcutaneous Q24H [name], MD   40 mg at 10/31/16 0859  . famotidine (PEPCID) tablet 20 mg  20 mg Oral BID [name], MD   20 mg at 10/31/16 2050  . ferrous sulfate tablet 325 mg  325 mg Oral BID WC [name], MD   325 mg at 10/31/16 1728  . fluticasone (FLONASE) 50 MCG/ACT nasal spray 1 spray  1 spray Each Nare Daily PRN [name], MD      . HYDROcodone-acetaminophen (NORCO/VICODIN) 5-325 MG per tablet 1-2 tablet  1-2 tablet Oral Q4H PRN Alphonsa Overall, MD      . iopamidol (ISOVUE-300) 61 % injection 30 mL  30 mL Oral Once PRN Deno Etienne, DO      . lactated ringers bolus 1,000 mL  1,000 mL Intravenous Q8H PRN [name], MD      . latanoprost (XALATAN) 0.005 % ophthalmic solution 1 drop  1 drop Both Eyes Daily Alphonsa Overall, MD   1 drop at 10/31/16 0900  . metoprolol tartrate (LOPRESSOR) tablet 37.5 mg  37.5 mg Oral BID Will Meredith Leeds, MD   37.5 mg at 10/31/16 2050  . multivitamin with minerals tablet 1 tablet  1 tablet Oral Daily [name], MD   1 tablet at 10/31/16 0857  . ondansetron  (ZOFRAN-ODT) disintegrating tablet 4 mg  4 mg Oral Q6H PRN Alphonsa Overall, MD       Or  . ondansetron [facility]) injection 4 mg  4 mg Intravenous Q6H PRN Alphonsa Overall, MD   4 mg at 10/29/16 1728  . pantoprazole (PROTONIX) EC tablet 40 mg  40 mg Oral Q1200 [name], MD   40 mg at 10/31/16 0905  . prednisoLONE acetate (PRED FORTE) 1 % ophthalmic suspension 1 drop  1 drop Right Eye Q1200 Alphonsa Overall, MD   1 drop at 10/31/16 1200  . psyllium (HYDROCIL/METAMUCIL) packet 1 packet  1 packet Oral Daily [name], MD   1 packet at 10/31/16 0900  . rosuvastatin (CRESTOR) tablet 5 mg  5 mg Oral QHS [name], MD   5 mg at 10/31/16 2051  . simethicone (MYLICON) chewable tablet 80 mg  80 mg Oral Q6H PRN [name], MD      . traMADol Veatrice Bourbon) tablet 50-100 mg  50-100 mg Oral Q6H PRN [name], MD      . vitamin B-12 (CYANOCOBALAMIN) tablet 1,000 mcg  1,000 mcg Oral Daily [name], MD   1,000 mcg at 10/31/16 0857  . zolpidem (AMBIEN) tablet 5 mg  5 mg Oral QHS PRN Alphonsa Overall, MD         No Known Allergies  Signed: Morton Peters, M.D., F.A.C.S. Gastrointestinal and Minimally Invasive Surgery [facility] [address] [place], [place] [address] [phone] Main / Paging   11/01/2016, 7:34 AM

## 2016-11-01 NOTE — Progress Notes (Addendum)
ANTICOAGULATION CONSULT NOTE - Initial Consult  Pharmacy Consult for enoxaparin Indication: atrial fibrillation  No Known Allergies  Patient Measurements: Height: _0  (180.3 cm) Weight: 181 lb 6.4 oz (82.3 kg) IBW/kg (Calculated) : 75.3 Heparin Dosing Weight:   Vital Signs: Temp: 98.3 F (36.8 C) (03/07 0525) Temp Source: Oral (03/07 0525) BP: 108/59 (03/07 0525) Pulse Rate: 83 (03/07 0525)  Labs:  Recent Labs  10/31/16 0603 11/01/16 0548  HGB 9.5* 9.2*  HCT 29.5* 28.4*  PLT 255 238  CREATININE 0.83 0.79    Estimated Creatinine Clearance: 68 mL/min (by C-G formula based on SCr of 0.79 mg/dL).   Medical History: Past Medical History:  Diagnosis Date  . Abnormal PSA   . Adenomatous polyp of colon 2010 and before 2006  . Anemia   . Aortic stenosis   . Aortic valve prosthesis present   . Atrial fibrillation ([place])   . CAD (coronary artery disease)   . DDD (degenerative disc disease), lumbar   . Depression   . Diverticulosis   . Dyslipidemia   . GERD (gastroesophageal reflux disease)   . Glaucoma   . History of blood transfusion 09/29/2016  . Hx of CABG   . Hypogonadism, male   . Macular degeneration   . OSA (obstructive sleep apnea)   . Osteoarthritis   . Osteopenia   . Prostate CA [facility]) prostate 2007   colon dx 2018, watching psa levels, no tx yet for prostate    Assessment: 64 YOM with permanent afib on warfarin prior to admission. Has prosthetic AVR (not mechanical).  He presented 3/2 with lower GIB following robotic colectomy 2/27 (off warfarin since 2/2 per notes, not resumed post-op).  Pharmacy asked to dose enoxaparin so can be discharged with 3-4 day bridge and resume warfarin as outpatient (appears patient being discharged today)  Warfarin regimen prior to surgery was warfarin 39m daily except 2.584mMon,Thur  Today, 11/01/2016  CBC: Hgb low but stable, pltc WNL  Renal: SCr WNL   Goal of Therapy:  Anti-Xa level 0.6-1 units/ml 4hrs after  LMWH dose given Monitor platelets by anticoagulation protocol: Yes   Plan:   D/c enoxaparin 40104mlast dose 9am 3/6) and start enoxaparin 7m101mmg/53m SQ q12h  Consult from cardiology states needs two days of LMWH bridging.  Will pend discharge order for #4 syringes (for surgery to sign)  Send lovenox teaching kit as appears patient discharging today  Appropriate to resume warfarin as per his previous regimen and follow-up as outpatient  CBC ordered daily  Jason ElandrmD, BCPS.   Pager: [phone] 7:58 AM

## 2016-11-01 NOTE — Discharge Instructions (Signed)
Use Lovenox injections & warfarin pills as directed by cardiology.  Follow up with your PCP Dr Joylene Draft to get your INR checked Mon/Tues.  SURGERY: POST OP INSTRUCTIONS (Surgery for small bowel obstruction, colon resection, etc)   ######################################################################  EAT Gradually transition to a high fiber diet with a fiber supplement over the next few days after discharge  WALK Walk an hour a day.  Control your pain to do that.    CONTROL PAIN Control pain so that you can walk, sleep, tolerate sneezing/coughing, go up/down stairs.  HAVE A BOWEL MOVEMENT DAILY Keep your bowels regular to avoid problems.  OK to try a laxative to override constipation.  OK to use an antidairrheal to slow down diarrhea.  Call if not better after 2 tries  CALL IF YOU HAVE PROBLEMS/CONCERNS Call if you are still struggling despite following these instructions. Call if you have concerns not answered by these instructions  ######################################################################   DIET Follow a light diet the first few days at home.  Start with a bland diet such as soups, liquids, starchy foods, low fat foods, etc.  If you feel full, bloated, or constipated, stay on a ful liquid or pureed/blenderized diet for a few days until you feel better and no longer constipated. Be sure to drink plenty of fluids every day to avoid getting dehydrated (feeling dizzy, not urinating, etc.). Gradually add a fiber supplement to your diet over the next week.  Gradually get back to a regular solid diet.  Avoid fast food or heavy meals the first week as you are more likely to get nauseated. It is expected for your digestive tract to need a few months to get back to normal.  It is common for your bowel movements and stools to be irregular.  You will have occasional bloating and cramping that should eventually fade away.  Until you are eating solid food normally, off all pain  medications, and back to regular activities; your bowels will not be normal. Focus on eating a low-fat, high fiber diet the rest of your life (See Getting to [place], below).  CARE of your INCISION or WOUND It is good for closed incision and even open wounds to be washed every day.  Shower every day.  Short baths are fine.  Wash the incisions and wounds clean with soap & water.    If you have a closed incision(s), wash the incision with soap & water every day.  You may leave closed incisions open to air if it is dry.   You may cover the incision with clean gauze & replace it after your daily shower for comfort. If you have skin tapes (Steristrips) or skin glue (Dermabond) on your incision, leave them in place.  They will fall off on their own like a scab.  You may trim any edges that curl up with clean scissors.  If you have staples, set up an appointment for them to be removed in the office in 10 days after surgery.  If you have a drain, wash around the skin exit site with soap & water and place a new dressing of gauze or band aid around the skin every day.  Keep the drain site clean & dry.    If you have an open wound with packing, see wound care instructions.  In general, it is encouraged that you remove your dressing and packing, shower with soap & water, and replace your dressing once a day.  Pack the wound with clean  gauze moistened with normal (0.9%) saline to keep the wound moist & uninfected.  Pressure on the dressing for 30 minutes will stop most wound bleeding.  Eventually your body will heal & pull the open wound closed over the next few months.  Raw open wounds will occasionally bleed or secrete yellow drainage until it heals closed.  Drain sites will drain a little until the drain is removed.  Even closed incisions can have mild bleeding or drainage the first few days until the skin edges scab over & seal.   If you have an open wound with a wound vac, see wound vac care  instructions.     ACTIVITIES as tolerated Start light daily activities --- self-care, walking, climbing stairs-- beginning the day after surgery.  Gradually increase activities as tolerated.  Control your pain to be active.  Stop when you are tired.  Ideally, walk several times a day, eventually an hour a day.   Most people are back to most day-to-day activities in a few weeks.  It takes 4-8 weeks to get back to unrestricted, intense activity. If you can walk 30 minutes without difficulty, it is safe to try more intense activity such as jogging, treadmill, bicycling, low-impact aerobics, swimming, etc. Save the most intensive and strenuous activity for last (Usually 4-8 weeks after surgery) such as sit-ups, heavy lifting, contact sports, etc.  Refrain from any intense heavy lifting or straining until you are off narcotics for pain control.  You will have off days, but things should improve week-by-week. DO NOT PUSH THROUGH PAIN.  Let pain be your guide: If it hurts to do something, don't do it.  Pain is your body warning you to avoid that activity for another week until the pain goes down. You may drive when you are no longer taking narcotic prescription pain medication, you can comfortably wear a seatbelt, and you can safely make sudden turns/stops to protect yourself without hesitating due to pain. You may have sexual intercourse when it is comfortable. If it hurts to do something, stop.  MEDICATIONS Take your usually prescribed home medications unless otherwise directed.   Blood thinners:  Usually you can restart any strong blood thinners after the second postoperative day.  It is OK to take aspirin right away.     If you are on strong blood thinners (warfarin/Coumadin, Plavix, Xerelto, Eliquis, Pradaxa, etc), discuss with your surgeon, medicine PCP, and/or cardiologist for instructions on when to restart the blood thinner & if blood monitoring is needed (PT/INR blood check, etc).     PAIN  CONTROL Pain after surgery or related to activity is often due to strain/injury to muscle, tendon, nerves and/or incisions.  This pain is usually short-term and will improve in a few months.  To help speed the process of healing and to get back to regular activity more quickly, DO THE FOLLOWING THINGS TOGETHER: 1. Increase activity gradually.  DO NOT PUSH THROUGH PAIN 2. Use Ice and/or Heat 3. Try Gentle Massage and/or Stretching 4. Take over the counter pain medication 5. Take Narcotic prescription pain medication for more severe pain  Good pain control = faster recovery.  It is better to take more medicine to be more active than to stay in bed all day to avoid medications. 1.  Increase activity gradually Avoid heavy lifting at first, then increase to lifting as tolerated over the next 6 weeks. Do not push through the pain.  Listen to your body and avoid positions and maneuvers than reproduce  the pain.  Wait a few days before trying something more intense Walking an hour a day is encouraged to help your body recover faster and more safely.  Start slowly and stop when getting sore.  If you can walk 30 minutes without stopping or pain, you can try more intense activity (running, jogging, aerobics, cycling, swimming, treadmill, sex, sports, weightlifting, etc.) Remember: If it hurts to do it, then dont do it! 2. Use Ice and/or Heat You will have swelling and bruising around the incisions.  This will take several weeks to resolve. Ice packs or heating pads (6-8 times a day, 30-60 minutes at a time) will help sooth soreness & bruising. Some people prefer to use ice alone, heat alone, or alternate between ice & heat.  Experiment and see what works best for you.  Consider trying ice for the first few days to help decrease swelling and bruising; then, switch to heat to help relax sore spots and speed recovery. Shower every day.  Short baths are fine.  It feels good!  Keep the incisions and wounds  clean with soap & water.   3. Try Gentle Massage and/or Stretching Massage at the area of pain many times a day Stop if you feel pain - do not overdo it 4. Take over the counter pain medication This helps the muscle and nerve tissues become less irritable and calm down faster Choose ONE of the following over-the-counter anti-inflammatory medications: Acetaminophen 500mg  tabs (Tylenol) 1-2 pills with every meal and just before bedtime (avoid if you have liver problems or if you have acetaminophen in you narcotic prescription) Naproxen 220mg  tabs (ex. Aleve, Naprosyn) 1-2 pills twice a day (avoid if you have kidney, stomach, IBD, or bleeding problems) Ibuprofen 200mg  tabs (ex. Advil, Motrin) 3-4 pills with every meal and just before bedtime (avoid if you have kidney, stomach, IBD, or bleeding problems) Take with food/snack several times a day as directed for at least 2 weeks to help keep pain / soreness down & more manageable. 5. Take Narcotic prescription pain medication for more severe pain A prescription for strong pain control is often given to you upon discharge (for example: oxycodone/Percocet, hydrocodone/Norco/Vicodin, or tramadol/Ultram) Take your pain medication as prescribed. Be mindful that most narcotic prescriptions contain Tylenol (acetaminophen) as well - avoid taking too much Tylenol. If you are having problems/concerns with the prescription medicine (does not control pain, nausea, vomiting, rash, itching, etc.), please call us [phone] to see if we need to switch you to a different pain medicine that will work better for you and/or control your side effects better. If you need a refill on your pain medication, you must call the office before 4 pm and on weekdays only.  By federal law, prescriptions for narcotics cannot be called into a pharmacy.  They must be filled out on paper & picked up from our office by the patient or authorized caretaker.  Prescriptions cannot be filled  after 4 pm nor on weekends.    WHEN TO CALL us [phone] Severe uncontrolled or worsening pain  Fever over 101 F (38.5 C) Concerns with the incision: Worsening pain, redness, rash/hives, swelling, bleeding, or drainage Reactions / problems with new medications (itching, rash, hives, nausea, etc.) Nausea and/or vomiting Difficulty urinating Difficulty breathing Worsening fatigue, dizziness, lightheadedness, blurred vision Other concerns If you are not getting better after two weeks or are noticing you are getting worse, contact our office [phone] for further advice.  We may need to  adjust your medications, re-evaluate you in the office, send you to the emergency room, or see what other things we can do to help. The clinic staff is available to answer your questions during regular business hours (8:30am-5pm).  Please dont hesitate to call and ask to speak to one of our nurses for clinical concerns.    A surgeon from [facility] is always on call at the hospitals 24 hours/day If you have a medical emergency, go to the nearest emergency room or call 911.  FOLLOW UP in our office One the day of your discharge from the hospital (or the next business weekday), please call [facility] to set up or confirm an appointment to see your surgeon in the office for a follow-up appointment.  Usually it is 2-3 weeks after your surgery.   If you have skin staples at your incision(s), let the office know so we can set up a time in the office for the nurse to remove them (usually around 10 days after surgery). Make sure that you call for appointments the day of discharge (or the next business weekday) from the hospital to ensure a convenient appointment time. IF YOU HAVE DISABILITY OR FAMILY LEAVE FORMS, BRING THEM TO THE OFFICE FOR PROCESSING.  DO NOT GIVE THEM TO YOUR DOCTOR.  [facility] [address] [place], [place], [place]  [address] ? [phone] - Main [phone] - [place],  [phone] - Fax [email or web address]  GETTING TO GOOD BOWEL HEALTH. It is expected for your digestive tract to need a few months to get back to normal.  It is common for your bowel movements and stools to be irregular.  You will have occasional bloating and cramping that should eventually fade away.  Until you are eating solid food normally, off all pain medications, and back to regular activities; your bowels will not be normal.   Avoiding constipation The goal: ONE SOFT BOWEL MOVEMENT A DAY!    Drink plenty of fluids.  Choose water first. TAKE A FIBER SUPPLEMENT EVERY DAY THE REST OF YOUR LIFE During your first week back home, gradually add back a fiber supplement every day Experiment which form you can tolerate.   There are many forms such as powders, tablets, wafers, gummies, etc Psyllium bran (Metamucil), methylcellulose (Citrucel), Miralax or Glycolax, Benefiber, Flax Seed.  Adjust the dose week-by-week (1/2 dose/day to 6 doses a day) until you are moving your bowels 1-2 times a day.  Cut back the dose or try a different fiber product if it is giving you problems such as diarrhea or bloating. Sometimes a laxative is needed to help jump-start bowels if constipated until the fiber supplement can help regulate your bowels.  If you are tolerating eating & you are farting, it is okay to try a gentle laxative such as double dose MiraLax, prune juice, or Milk of Magnesia.  Avoid using laxatives too often. Stool softeners can sometimes help counteract the constipating effects of narcotic pain medicines.  It can also cause diarrhea, so avoid using for too long. If you are still constipated despite taking fiber daily, eating solids, and a few doses of laxatives, call our office. Controlling diarrhea Try drinking liquids and eating bland foods for a few days to avoid stressing your intestines further. Avoid dairy products (especially milk &  ice cream) for a short time.  The intestines often can lose the ability to digest lactose when stressed. Avoid foods that cause gassiness  or bloating.  Typical foods include beans and other legumes, cabbage, broccoli, and dairy foods.  Avoid greasy, spicy, fast foods.  Every person has some sensitivity to other foods, so listen to your body and avoid those foods that trigger problems for you. Probiotics (such as active yogurt, Align, etc) may help repopulate the intestines and colon with normal bacteria and calm down a sensitive digestive tract Adding a fiber supplement gradually can help thicken stools by absorbing excess fluid and retrain the intestines to act more normally.  Slowly increase the dose over a few weeks.  Too much fiber too soon can backfire and cause cramping & bloating. It is okay to try and slow down diarrhea with a few doses of antidiarrheal medicines.   Bismuth subsalicylate (ex. Kayopectate, Pepto Bismol) for a few doses can help control diarrhea.  Avoid if pregnant.   Loperamide (Imodium) can slow down diarrhea.  Start with one tablet (2mg ) first.  Avoid if you are having fevers or severe pain.  ILEOSTOMY PATIENTS WILL HAVE CHRONIC DIARRHEA since their colon is not in use.    Drink plenty of liquids.  You will need to drink even more glasses of water/liquid a day to avoid getting dehydrated. Record output from your ileostomy.  Expect to empty the bag every 3-4 hours at first.  Most people with a permanent ileostomy empty their bag 4-6 times at the least.   Use antidiarrheal medicine (especially Imodium) several times a day to avoid getting dehydrated.  Start with a dose at bedtime & breakfast.  Adjust up or down as needed.  Increase antidiarrheal medications as directed to avoid emptying the bag more than 8 times a day (every 3 hours). Work with your wound ostomy nurse to learn care for your ostomy.  See ostomy care instructions. TROUBLESHOOTING IRREGULAR BOWELS 1) Start with a  soft & bland diet. No spicy, greasy, or fried foods.  2) Avoid gluten/wheat or dairy products from diet to see if symptoms improve. 3) Miralax 17gm or flax seed mixed in [place]. water or juice-daily. May use 2-4 times a day as needed. 4) Gas-X, Phazyme, etc. as needed for gas & bloating.  5) Prilosec (omeprazole) over-the-counter as needed 6)  Consider probiotics (Align, Activa, etc) to help calm the bowels down  Call your doctor if you are getting worse or not getting better.  Sometimes further testing (cultures, endoscopy, X-ray studies, CT scans, bloodwork, etc.) may be needed to help diagnose and treat the cause of the diarrhea. [facility], [place], [place], [place], [place]  [address] [phone] - Main.    [phone]  - Toll Free.   [phone] - Fax [email or web address]

## 2016-11-03 DIAGNOSIS — E538 Deficiency of other specified B group vitamins: Secondary | ICD-10-CM | POA: Diagnosis not present

## 2016-11-03 DIAGNOSIS — K219 Gastro-esophageal reflux disease without esophagitis: Secondary | ICD-10-CM | POA: Diagnosis not present

## 2016-11-03 DIAGNOSIS — Z7901 Long term (current) use of anticoagulants: Secondary | ICD-10-CM | POA: Diagnosis not present

## 2016-11-03 DIAGNOSIS — K922 Gastrointestinal hemorrhage, unspecified: Secondary | ICD-10-CM | POA: Diagnosis not present

## 2016-11-03 DIAGNOSIS — I480 Paroxysmal atrial fibrillation: Secondary | ICD-10-CM | POA: Diagnosis not present

## 2016-11-03 DIAGNOSIS — I493 Ventricular premature depolarization: Secondary | ICD-10-CM | POA: Diagnosis not present

## 2016-11-03 DIAGNOSIS — C180 Malignant neoplasm of cecum: Secondary | ICD-10-CM | POA: Diagnosis not present

## 2016-11-03 DIAGNOSIS — G4733 Obstructive sleep apnea (adult) (pediatric): Secondary | ICD-10-CM | POA: Diagnosis not present

## 2016-11-03 DIAGNOSIS — D500 Iron deficiency anemia secondary to blood loss (chronic): Secondary | ICD-10-CM | POA: Diagnosis not present

## 2016-11-03 DIAGNOSIS — E784 Other hyperlipidemia: Secondary | ICD-10-CM | POA: Diagnosis not present

## 2016-11-03 DIAGNOSIS — Z9989 Dependence on other enabling machines and devices: Secondary | ICD-10-CM | POA: Diagnosis not present

## 2016-11-03 DIAGNOSIS — I350 Nonrheumatic aortic (valve) stenosis: Secondary | ICD-10-CM | POA: Diagnosis not present

## 2016-11-06 ENCOUNTER — Telehealth: Payer: Self-pay | Admitting: Surgery

## 2016-11-06 DIAGNOSIS — Z7901 Long term (current) use of anticoagulants: Secondary | ICD-10-CM | POA: Diagnosis not present

## 2016-11-06 DIAGNOSIS — K922 Gastrointestinal hemorrhage, unspecified: Secondary | ICD-10-CM | POA: Diagnosis not present

## 2016-11-06 NOTE — Telephone Encounter (Signed)
Spoke to the pt's wife to schedule an appt for pt to see an oncologist. [name] is a current pt of Dr. Irene [name] who has been treating for anemia. Pt and spouse prefer GI onc. Per Dr. Irene [name] it is ok to switch MD. Pt has been scheduled to see Dr. Burr [name] on 3/15 at 230pm. Aware to arrive 30 minutes early. Agreed to the appt date and time.

## 2016-11-07 ENCOUNTER — Telehealth: Payer: Self-pay | Admitting: Hematology

## 2016-11-07 ENCOUNTER — Telehealth: Payer: Self-pay | Admitting: *Deleted

## 2016-11-07 NOTE — Telephone Encounter (Signed)
Left message for pt to call office. Dr. Benay Spice can see pt on 3/14 @ 1215. Message to new patient coordinator to follow up.

## 2016-11-07 NOTE — Telephone Encounter (Signed)
Pt's wife cld back saying they would like to see Dr. Benay Spice. Appt w/Dr. Burr Medico has been cancelled and the referral has been sent to Dr. Benay Spice for an appt.

## 2016-11-08 ENCOUNTER — Telehealth: Payer: Self-pay | Admitting: Oncology

## 2016-11-08 ENCOUNTER — Ambulatory Visit (HOSPITAL_BASED_OUTPATIENT_CLINIC_OR_DEPARTMENT_OTHER): Payer: Medicare Other | Admitting: Oncology

## 2016-11-08 VITALS — BP 122/67 | HR 96 | Temp 98.4°F | Resp 17 | Wt 172.8 lb

## 2016-11-08 DIAGNOSIS — I4891 Unspecified atrial fibrillation: Secondary | ICD-10-CM | POA: Diagnosis not present

## 2016-11-08 DIAGNOSIS — C180 Malignant neoplasm of cecum: Secondary | ICD-10-CM | POA: Diagnosis not present

## 2016-11-08 DIAGNOSIS — C61 Malignant neoplasm of prostate: Secondary | ICD-10-CM | POA: Diagnosis not present

## 2016-11-08 NOTE — Patient Instructions (Signed)
Capecitabine tablets What is this medicine? CAPECITABINE (ka pe SITE a been) is a chemotherapy drug. It slows the growth of cancer cells. This medicine is used to treat breast cancer, and also colon or rectal cancer. This medicine may be used for other purposes; ask your health care provider or pharmacist if you have questions. COMMON BRAND NAME(S): Xeloda What should I tell my health care provider before I take this medicine? They need to know if you have any of these conditions: -bleeding or blood disorders -dihydropyrimidine dehydrogenase (DPD) deficiency -heart disease -infection (especially a virus infection such as chickenpox, cold sores, or herpes) -kidney disease -liver disease -an unusual or allergic reaction to capecitabine, 5-fluorouracil, other medicines, foods, dyes, or preservatives -pregnant or trying to get pregnant -breast-feeding How should I use this medicine? Take this medicine by mouth with a glass of water, within 30 minutes of the end of a meal. Do not cut, crush or chew this medicine. Follow the directions on the prescription label. Take your medicine at regular intervals. Do not take it more often than directed. Do not stop taking except on your doctor's advice. Your doctor may want you to take a combination of 150 mg and 500 mg tablets for each dose. It is very important that you know how to correctly take your dose. Taking the wrong tablets could result in an overdose (too much medication) or underdose (too little medication). Talk to your pediatrician regarding the use of this medicine in children. Special care may be needed. Overdosage: If you think you have taken too much of this medicine contact a poison control center or emergency room at once. NOTE: This medicine is only for you. Do not share this medicine with others. What if I miss a dose? If you miss a dose, do not take the missed dose at all. Do not take double or extra doses. Instead, continue with your  next scheduled dose and check with your doctor. What may interact with this medicine? -antacids with aluminum and/or magnesium -folic acid -leucovorin -medicines to increase blood counts like filgrastim, pegfilgrastim, sargramostim -phenytoin -vaccines -warfarin Talk to your doctor or health care professional before taking any of these medicines: -acetaminophen -aspirin -ibuprofen -ketoprofen -naproxen This list may not describe all possible interactions. Give your health care provider a list of all the medicines, herbs, non-prescription drugs, or dietary supplements you use. Also tell them if you smoke, drink alcohol, or use illegal drugs. Some items may interact with your medicine. What should I watch for while using this medicine? Visit your doctor for checks on your progress. This drug may make you feel generally unwell. This is not uncommon, as chemotherapy can affect healthy cells as well as cancer cells. Report any side effects. Continue your course of treatment even though you feel ill unless your doctor tells you to stop. In some cases, you may be given additional medicines to help with side effects. Follow all directions for their use. Call your doctor or health care professional for advice if you get a fever, chills or sore throat, or other symptoms of a cold or flu. Do not treat yourself. This drug decreases your body's ability to fight infections. Try to avoid being around people who are sick. This medicine may increase your risk to bruise or bleed. Call your doctor or health care professional if you notice any unusual bleeding. Be careful brushing and flossing your teeth or using a toothpick because you may get an infection or bleed more easily. If  may increase your risk to bruise or bleed. Call your doctor or health care professional if you notice any unusual bleeding.  Be careful brushing and flossing your teeth or using a toothpick because you may get an infection or bleed more easily. If you have any dental work done, tell your dentist you are receiving this medicine.  Avoid taking products that contain aspirin, acetaminophen, ibuprofen,  naproxen, or ketoprofen unless instructed by your doctor. These medicines may hide a fever.  Do not become pregnant while taking this medicine or for 6 months after stopping it. Women should inform their doctor if they wish to become pregnant or think they might be pregnant. There is a potential for serious side effects to an unborn child. Talk to your health care professional or pharmacist for more information. Do not breast-feed an infant while taking this medicine or for 2 weeks after stopping it.  Men are advised not to father a child while taking this medicine or for 3 months after stopping it.  This medicine may make it more difficult to get pregnant or father a child. Talk with your doctor or health care professional if you are concerned about your fertility.  What side effects may I notice from receiving this medicine?  Side effects that you should report to your doctor or health care professional as soon as possible:  -allergic reactions like skin rash, itching or hives, swelling of the face, lips, or tongue  -low blood counts - this medicine may decrease the number of white blood cells, red blood cells and platelets. You may be at increased risk for infections and bleeding.  -signs of infection - fever or chills, cough, sore throat, pain or difficulty passing urine  -signs of decreased platelets or bleeding - bruising, pinpoint red spots on the skin, black, tarry stools, blood in the urine  -signs of decreased red blood cells - unusually weak or tired, fainting spells, lightheadedness  -breathing problems  -changes in vision  -chest pain  -dark urine  -diarrhea of more than 4 bowel movements in one day or any diarrhea at night; bloody or watery diarrhea  -dizziness  -mouth sores  -nausea and vomiting  -pain, tingling, numbness in the hands or feet  -redness, swelling, or sores on hands or feet  -stomach pain  -vomiting  -yellow color of skin or eyes   Side effects that usually do not require medical attention (report to your doctor or health care professional if they continue or are bothersome):  -constipation  -diarrhea  -dry or itchy skin  -hair loss  -loss of appetite  -nausea  -weak or tired  This list may not describe all possible side effects. Call your doctor for medical advice about side effects. You may report side effects to FDA at 1-800-FDA-1088.  Where should I keep my medicine?  Keep out of the reach of children.  Store at room temperature between 15 and 30 degrees C (59 and 86 degrees F). Keep container tightly closed. Throw away any unused medicine after the expiration date.  NOTE: This sheet is a summary. It may not cover all possible information. If you have questions about this medicine, talk to your doctor, pharmacist, or health care provider.  © 2018 [employer]/Gold Standard (2015-09-01 13:11:21)

## 2016-11-08 NOTE — Progress Notes (Signed)
[facility] Patient Consult   Referring MD: Fadil Macmaster 83 y.o.  February 03, 1926    Reason for Referral: Colon cancer   HPI: [name] has a history of anemia for the past few years. He has received red cell transfusions and IV iron. Evaluation for the anemia included an upper endoscopy in January 2016 with ablation of proximal gastric AVMs. A capsule endoscopy in July 2017 revealed a few scattered small bowel AVMs. He was evaluated by Dr. Irene Limbo in August 2017 and received IV iron.  He is followed by [name] management of prostate cancer. A CT on 08/08/2016 revealed fullness in the cecum, an underlying mass could not be excluded. No enlarged abdominal or pelvic lymph nodes. He was referred to gastroenterology and was taken to a colonoscopy by Dr. Henrene Pastor on 09/06/2016. A nonobstructing mass was found at the cecum. A polyp was removed from the rectum. The mass was biopsied and the pathology revealed invasive adenocarcinoma. A CT of the chest on 10/02/2016 revealed no evidence of metastatic disease. He was referred to Dr. Johney Maine and was taken and was taken the operating room for a robotic right colectomy on 10/24/2016. There was no evidence of metastatic disease. An ileocolonic anastomosis was created. The pathology ([id number]) revealed invasive well-differentiated adenocarcinoma with associated abundant mucin. Tumor invaded through the bowel wall to involve the serosal surface. The resection margins were negative. 3 of 23 lymph nodes were positive for metastatic adenocarcinoma. No tumor deposits. No macroscopic tumor perforation. The tumor was located at the cecum. No additional polyps. No loss of mismatch repair protein expression. MSI-stable. He was readmitted with GI bleeding and severe anemia on 10/27/2016. The bleeding resolved and anticoagulation was resumed. He is now maintained on Coumadin. He had an episode of diarrhea last night. No other  complaint.     Past Medical History:  Diagnosis Date  . Abnormal PSA   . Adenomatous polyp of colon 2010 and before 2006  . Anemia   . Aortic stenosis   . Aortic valve prosthesis present   . Atrial fibrillation ([place])   . CAD (coronary artery disease)   . DDD (degenerative disc disease), lumbar   . Depression   . Diverticulosis   . Dyslipidemia   . GERD (gastroesophageal reflux disease)   . Glaucoma   . History of blood transfusion 09/29/2016  . Hx of CABG   . Hypogonadism, male   . Macular degeneration   . OSA (obstructive sleep apnea)   . Osteoarthritis   . Osteopenia   . Prostate CA [facility]) prostate 2007   colon dx 2018, watching psa levels, no tx yet for prostate    . Colon cancer, cecum, T4 N1b  February 2018  Past Surgical History:  Procedure Laterality Date  . AORTIC VALVE REPLACEMENT  October 2011   [place]agna Ease pericardial tissue valve #[id number]  [place]  . CATARACT EXTRACTION Bilateral   . CORNEAL TRANSPLANT Right   . CORONARY ARTERY BYPASS GRAFT  October 2011   LIMA to LAD  . ESOPHAGOGASTRODUODENOSCOPY N/A 09/16/2014   Procedure: ESOPHAGOGASTRODUODENOSCOPY (EGD);  Surgeon: JIrene Shipper MD;  [facility];  Service: Endoscopy;  Laterality: N/A;  . TONSILLECTOMY  76 yrs ago    Medications: Reviewed  Allergies: No Known Allergies  Family history: Mother had breast cancer dates 934 he has 2 sons and one sister. No other family history of cancer.  Social History: He  lives with his wife at [facility]. He is retired Chief Financial Officer. He quit smoking cigarettes in 1992. He has a few glasses of wine per week. He received multiple transfusions for treatment of iron deficiency anemia. No risk factor for HIV or hepatitis. He was in [employer] in the [place].   ROS:   Positives include:Diarrhea 11/07/2016, chronic right knee pain, chronic left foot pain-felt to be related to lumbar disease, dark stools prior to surgery  A complete ROS was  otherwise negative.  Physical Exam:  Blood pressure 122/67, pulse 96, temperature 98.4 F (36.9 C), temperature source Oral, resp. rate 17, weight 172 lb 12.8 oz (78.4 kg), SpO2 100 %.  HEENT: Oral cavity without visible mass, neck without mass  Lungs: Rhonchi at the posterior basis, no respiratory distress  Cardiac: Irregular  Abdomen: No hepatosplenomegaly, no mass, small superficial opening at the left side of the low transverse incision  GU: Testes without mass  Vascular: No leg edema  Lymph nodes: No cervical, supraclavicular, axillary, or inguinal nodes  Neurologic: Alert and oriented, the motor exam appears intact in the upper and lower extremities  Skin: Multiple benign appearing moles over the trunk  Musculoskeletal: No spine tenderness  LAB:  CBC  Lab Results  Component Value Date   WBC 8.3 11/01/2016   HGB 9.2 (L) 11/01/2016   HCT 28.4 (L) 11/01/2016   MCV 85.8 11/01/2016   PLT 238 11/01/2016   NEUTROABS 9.8 (H) 10/29/2016     CMP      Component Value Date/Time   NA 135 11/01/2016 0548   NA 141 08/10/2016 1221   K 3.3 (L) 11/01/2016 0548   K 4.3 08/10/2016 1221   CL 104 11/01/2016 0548   CO2 25 11/01/2016 0548   CO2 25 08/10/2016 1221   GLUCOSE 89 11/01/2016 0548   GLUCOSE 97 08/10/2016 1221   BUN 10 11/01/2016 0548   BUN 15.5 08/10/2016 1221   CREATININE 0.79 11/01/2016 0548   CREATININE 1.1 08/10/2016 1221   CALCIUM 8.0 (L) 11/01/2016 0548   CALCIUM 8.9 08/10/2016 1221   PROT 6.0 (L) 10/27/2016 1700   PROT 7.3 08/10/2016 1221   ALBUMIN 3.1 (L) 10/27/2016 1700   ALBUMIN 3.6 08/10/2016 1221   AST 26 10/27/2016 1700   AST 17 08/10/2016 1221   ALT 17 10/27/2016 1700   ALT 16 08/10/2016 1221   ALKPHOS 39 10/27/2016 1700   ALKPHOS 63 08/10/2016 1221   BILITOT 0.6 10/27/2016 1700   BILITOT 0.22 08/10/2016 1221   GFRNONAA >60 11/01/2016 0548   GFRAA >60 11/01/2016 0548   CEA on 10/17/2016-4.6   Imaging:  CT abdomen/pelvis 08/08/2016-images  reviewed   Assessment/Plan:   1. Colon cancer, cecum, stage IIIB (T4a,N1b), status post a right colectomy 10/24/2016  3/23 lymph nodes positive for metastatic adenocarcinoma  MSI-stable, no loss of mismatch repair protein expression  2.   History of iron deficiency anemia secondary to #1, status post IV iron therapy and multiple red cell transfusions  3.   Prostate cancer followed by [name] with observation  4.  History of coronary artery disease, status post coronary artery bypass surgery 2011  5.  Aortic valve replacement 2011  6.  Atrial fibrillation-maintained on Coumadin  7.  Glaucoma   Disposition:   Mr. Tetzloff has been diagnosed with stage III colon cancer. I discussed the prognosis and reviewed the details of the surgical pathology report with Mr. Weida and his family. He is at significant risk of  developing recurrent colon cancer over the next several years. I explained the evidence supporting a benefit of adjuvant 5-fluorouracil chemotherapy in this setting. We discussed the expected decrease in the relapse rate with capecitabine. I recommend adjuvant capecitabine as standard therapy.  We reviewed the potential toxicities associated with capecitabine including the chance for mucositis, diarrhea, nausea, and hematologic toxicity. We discussed the rash, sun sensitivity, hyperpigmentation, and hand/foot syndrome associated with capecitabine. He was given reading materials on capecitabine.  We will obtain records from [name] regarding the history of prostate cancer. If there is no evidence of extra prostatic disease then it is unlikely his lifespan would be limited by prostate cancer. However if he has metastatic prostate cancer I would be less inclined to recommend adjuvant chemotherapy.  Mr. Dom will return for an office visit and further discussion on 11/16/2016.  We discussed the potential interaction between Coumadin and capecitabine. We will ask Dr. Martinique  and Dr. Joylene Draft if they recommend switching to a different form of anticoagulation if Mr. Ander is treated with capecitabine.  He does not appear to have hereditary non-polyposis colon cancer syndrome, but his family members are at increased risk for colon cancer. He will be sure they have appropriate screening.  50 minutes were spent with the patient today. The majority of the time was used for counseling and coordination of care.        Betsy Coder, MD  11/08/2016, 1:46 PM

## 2016-11-08 NOTE — Telephone Encounter (Signed)
Gave patient AVS and scheduled appt per 11/08/2016 los.

## 2016-11-09 ENCOUNTER — Ambulatory Visit: Payer: Medicare Other | Admitting: Hematology

## 2016-11-13 ENCOUNTER — Encounter: Payer: Self-pay | Admitting: Pharmacist

## 2016-11-13 ENCOUNTER — Ambulatory Visit (INDEPENDENT_AMBULATORY_CARE_PROVIDER_SITE_OTHER): Payer: Medicare Other | Admitting: Physician Assistant

## 2016-11-13 VITALS — BP 130/84 | HR 53 | Ht 71.0 in | Wt 170.0 lb

## 2016-11-13 DIAGNOSIS — C180 Malignant neoplasm of cecum: Secondary | ICD-10-CM

## 2016-11-13 DIAGNOSIS — D500 Iron deficiency anemia secondary to blood loss (chronic): Secondary | ICD-10-CM | POA: Diagnosis not present

## 2016-11-13 DIAGNOSIS — I10 Essential (primary) hypertension: Secondary | ICD-10-CM | POA: Diagnosis not present

## 2016-11-13 DIAGNOSIS — I25810 Atherosclerosis of coronary artery bypass graft(s) without angina pectoris: Secondary | ICD-10-CM | POA: Diagnosis not present

## 2016-11-13 DIAGNOSIS — Z7901 Long term (current) use of anticoagulants: Secondary | ICD-10-CM | POA: Diagnosis not present

## 2016-11-13 DIAGNOSIS — Z952 Presence of prosthetic heart valve: Secondary | ICD-10-CM | POA: Diagnosis not present

## 2016-11-13 DIAGNOSIS — Z8719 Personal history of other diseases of the digestive system: Secondary | ICD-10-CM | POA: Diagnosis not present

## 2016-11-13 DIAGNOSIS — I482 Chronic atrial fibrillation: Secondary | ICD-10-CM

## 2016-11-13 DIAGNOSIS — I4821 Permanent atrial fibrillation: Secondary | ICD-10-CM

## 2016-11-13 MED ORDER — METOPROLOL TARTRATE 25 MG PO TABS: 25.0000 mg | ORAL_TABLET | Freq: Two times a day (BID) | ORAL | 3 refills | Status: DC

## 2016-11-13 NOTE — Progress Notes (Signed)
Oral Chemotherapy Pharmacist Encounter  New prescription for Xeloda has been sent to the [facility] outpatient pharmacy for benefit analysis and approval.   Reviewed labs/meds.  Dose is appropriate. Notable pt is on Coumadin (h/o aortic valve replacement) but considering alternative w/ Dr. Martinique & Dr. Joylene Draft. Notable pt had recent GI bleed w/ severe anemia 10/27/16.  MD has counseled and provided handout.  Will follow up with patient regarding insurance and pharmacy.   Thank you, Kennith Center, Pharm.D., CPP 11/13/2016@3 :52 PM Oral Chemotherapy Clinic

## 2016-11-13 NOTE — Progress Notes (Signed)
Cardiology Office Note    Date:  11/14/2016   ID:  Jason [name], DOB 18-Oct-1925, MRN [id number]  PCP:  Jerlyn Ly, MD  Cardiologist:  Dr. Martinique  Chief Complaint  Patient presents with  . Follow-up    seen for Dr. Martinique    History of Present Illness:  Jason [name] is a 83 y.o. male with PMH of CAD s/p CABG in 2011, permanent atrial fibrillation, prosthetic aortic valve, and hypertension. He had a history of LIMA to LAD, also had a 40-50% RCA stenosis treated medically. He has been on Xarelto for atrial fibrillation. In January 2016, he was admitted with GI bleed, throat was held. He did not require transfusion at the time. He did undergo upper EGD with finding of gastric AVM that was ablated. Since then, he has been transitioned to Coumadin instead. Echocardiogram obtained on 07/17/2014 showed EF 55-60%, no regional wall motion abnormality, well-functioning aortic valve, moderately to severely fibrotic mitral valve with mild MR. His last office visit was on 08/14/2016 at which time he was doing well. According to Dr. Martinique, we can potentially consider him for left atrial occlusive device if he does have any recurrent bleeding.  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 He was discharged on 10/27/2016, however returned the same afternoon with complaint of GI bleed. He was seen by Dr. Curt Bears on 10/29/2016, his beta blocker was increased to help rate control with his permanent atrial fibrillation. GI bleeding was felt to be related to the GI procedure. He was eventually discharged on 11/01/2016 with Coumadin bridged with Lovenox. He manages his INR had his primary care physician's office.  Since discharge, he has been followed by Dr. Betsy Coder with oncology on 11/08/2016 who is considering to start the patient on capecitabine for adjuvant chemotherapy. However there is a potential interaction between Coumadin and Capecitabine. I recommended eliquis  as potential alternative. However patient wished to discuss this with Dr. Martinique and clear with him instead. I will discuss with Dr. Martinique myself to speed up the process and inform Dr. Benay Spice our recommendation. Otherwise, he has been doing well since his recent discharge. According to the patient, his primary care physician has been obtaining lab works, he just got another lab work for the [facility], he was told labs shows that he CBC is improving. Otherwise he has not had any further bleeding issues. He denies any chest discomfort or shortness of breath. I think it is worthwhile to switch him from Coumadin to eliquis as a trial. He understand that he can potentially have bleeding issues. Due to episodes of wide complex tachycardia seen on telemetry in the hospital, Dr. Curt Bears was concerned of possible aberrancy versus VT, therefore his metoprolol was increased to 37.5 twice a day, however upon discharge, he was discharged on the previous metoprolol 25 mg once a day instead. His heart rate is in the 50s today, however due to atrial fibrillation, sometimes it goes up 80s and 90s. I am hesitant to increase all the way back up to 37.5, however I will increase it to 25 mg twice a day from 25 mg daily in attempt to better control the heart rate.   Past Medical History:  Diagnosis Date  . Abnormal PSA   . Adenomatous polyp of colon 2010 and before 2006  . Anemia   . Aortic stenosis   . Aortic valve prosthesis present   . Atrial fibrillation ([place])   . CAD (coronary artery  disease)   . DDD (degenerative disc disease), lumbar   . Depression   . Diverticulosis   . Dyslipidemia   . GERD (gastroesophageal reflux disease)   . Glaucoma   . History of blood transfusion 09/29/2016  . Hx of CABG   . Hypogonadism, male   . Macular degeneration   . OSA (obstructive sleep apnea)   . Osteoarthritis   . Osteopenia   . Prostate CA [facility]) prostate 2007   colon dx 2018, watching psa levels, no tx yet for prostate     Past Surgical History:  Procedure Laterality Date  . AORTIC VALVE REPLACEMENT  October 2011   Magna Ease pericardial tissue valve #60mm  . [place]   lower  . CATARACT EXTRACTION Bilateral   . CORNEAL TRANSPLANT Right   . CORONARY ARTERY BYPASS GRAFT  October 2011   LIMA to LAD  . ESOPHAGOGASTRODUODENOSCOPY N/A 09/16/2014   Procedure: ESOPHAGOGASTRODUODENOSCOPY (EGD);  Surgeon: [name], MD;  Location: [facility] ENDOSCOPY;  Service: Endoscopy;  Laterality: N/A;  . TONSILLECTOMY  76 yrs ago    Current Medications: Outpatient Medications Prior to Visit  Medication Sig Dispense Refill  . acetaminophen (TYLENOL) 500 MG tablet Take 500 mg by mouth every 6 (six) hours as needed for moderate pain.     . Calcium Carbonate-Vitamin D (CALCIUM-VITAMIN D) 500-200 MG-UNIT per tablet Take 1 tablet by mouth daily.    . Cholecalciferol 1000 UNITS capsule Take 1,000 Units by mouth daily.     . dorzolamide (TRUSOPT) 2 % ophthalmic solution PLACE 1 DROP INTO BOTH EYES 3 TIMES DAILY.  11  . fluticasone (FLONASE) 50 MCG/ACT nasal spray Place 1 spray into both nostrils daily as needed for allergies or rhinitis.     [name] glucosamine-chondroitin 500-400 MG tablet Take 1 tablet by mouth 2 (two) times daily.    [name] latanoprost (XALATAN) 0.005 % ophthalmic solution Place 1 drop into both eyes daily.  11  . Multiple Vitamin (MULTIVITAMIN WITH MINERALS) TABS tablet Take 1 tablet by mouth daily.    . NON FORMULARY Inhale 1 application into the lungs at bedtime. CPAP for apnea    . omega-3 acid ethyl esters (LOVAZA) 1 G capsule Take 1 g by mouth daily.     [name] perphenazine-amitriptyline (ETRAFON/TRIAVIL) 4-25 MG TABS Take 1 tablet by mouth at bedtime.    . prednisoLONE acetate (PRED FORTE) 1 % ophthalmic suspension Place 1 drop into the right eye daily at 12 noon.     . ranitidine (ZANTAC) 150 MG tablet Take 150 mg by mouth daily.    . rosuvastatin (CRESTOR) 5 MG tablet Take 5 mg by mouth every other day.      . traMADol (ULTRAM) 50 MG tablet Take 1-2 tablets (50-100 mg total) by mouth every 6 (six) hours as needed for moderate pain or severe pain. 30 tablet 0  . vitamin B-12 (CYANOCOBALAMIN) 1000 MCG tablet Take 1,000 mcg by mouth daily.    [name] warfarin (COUMADIN) 5 MG tablet Take 5 mg by mouth daily at 6 PM. PER PT HE IS ON 5 MG DAILY    . metoprolol tartrate (LOPRESSOR) 25 MG tablet Take 1 tablet (25 mg total) by mouth daily. (Patient taking differently: Take 25 mg by mouth at bedtime. ) 90 tablet 1  . enoxaparin (LOVENOX) 80 MG/0.8ML injection Inject 0.8 mLs (80 mg total) into the skin 2 (two) times daily. (Patient not taking: Reported on 11/13/2016) 4 Syringe 0   No facility-administered medications  prior to visit.      Allergies:   Patient has no known allergies.   Social History   Social History  . Marital status: Married    Spouse name: Izora Gala  . Number of children: 2  . Years of education: N/A   Social History Main Topics  . Smoking status: Former Smoker    Packs/day: 1.00    Years: 40.00    Types: Cigarettes    Quit date: 11/16/1990  . Smokeless tobacco: Never Used  . Alcohol use 1.2 oz/week    2 Glasses of wine per week     Comment: wine few x per week  . Drug use: No  . Sexual activity: Not Asked   Other Topics Concern  . None   Social History Narrative  . None     Family History:  The patient's family history includes Heart attack in his father.   ROS:   Please see the history of present illness.    ROS All other systems reviewed and are negative.   PHYSICAL EXAM:   VS:  BP 130/84 (BP Location: Left Arm, Patient Position: Sitting, Cuff Size: Normal)   Pulse (!) 53   Ht 5\' 11"  (1.803 m)   Wt 170 lb (77.1 kg)   BMI 23.71 kg/m    GEN: Well nourished, well developed, in no acute distress  HEENT: normal  Neck: no JVD, carotid bruits, or masses Cardiac: irregularly irregular; no murmurs, rubs, or gallops,no edema  Respiratory:  clear to auscultation bilaterally,  normal work of breathing GI: soft, nontender, nondistended, + BS MS: no deformity or atrophy  Skin: warm and dry, no rash Neuro:  Alert and Oriented x 3, Strength and sensation are intact Psych: euthymic mood, full affect  Wt Readings from Last 3 Encounters:  11/13/16 170 lb (77.1 kg)  11/08/16 172 lb 12.8 oz (78.4 kg)  10/28/16 181 lb 6.4 oz (82.3 kg)      Studies/Labs Reviewed:   EKG:  EKG is not ordered today.    Recent Labs: 10/25/2016: Magnesium 1.8 10/27/2016: ALT 17 11/01/2016: BUN 10; Creatinine, Ser 0.79; Hemoglobin 9.2; Platelets 238; Potassium 3.3; Sodium 135   Lipid Panel    Component Value Date/Time   CHOL 89 01/03/2012 0835   TRIG 125.0 01/03/2012 0835   HDL 37.40 (L) 01/03/2012 0835   CHOLHDL 2 01/03/2012 0835   VLDL 25.0 01/03/2012 0835   LDLCALC 27 01/03/2012 0835    Additional studies/ records that were reviewed today include:   Echo 07/17/2014  LV EF: 55% -  60%  - Left ventricle: The cavity size was normal. Wall thickness was increased in a pattern of mild LVH. There was mild concentric hypertrophy. Systolic function was normal. The estimated ejection fraction was in the range of 55% to 60%. Wall motion was normal; there were no regional wall motion abnormalities. - Ventricular septum: Septal motion showed paradox. - Aortic valve: A bioprosthesis was present and functioning normally. Valve area (VTI): 1.6 cm^2. Valve area (Vmax): 1.5 cm^2. Valve area (Vmean): 1.33 cm^2. - Mitral valve: Calcified, moderately to severely fibrotic annulus. Mildly thickened leaflets . The findings are consistent with trivial stenosis. There was mild regurgitation directed centrally. Valve area by continuity equation (using LVOT flow): 2.6 cm^2. - Left atrium: The atrium was moderately dilated. - Right atrium: The atrium was mildly dilated.   ASSESSMENT:    1. Permanent atrial fibrillation ([place])   2. Coronary artery disease involving coronary  bypass graft of native heart  without angina pectoris   3. History of prosthetic aortic valve   4. Essential hypertension   5. H/O: GI bleed   6. 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 Permanent atrial fibrillation on Coumadin: He has taken Xarelto's in the past, however had GI bleed. He underwent endoscopy and ablation of AVM. Most recently had another GI bleed after colectomy surgery for cecal cancer. He was started on 37.5 mg twice a day of metoprolol in the hospital for episodic wide-complex tachycardia concerning for either aberrancy versus VT, he has been seen by Dr. Curt Bears who increased her beta blocker. For some reason upon discharge, he was instructed to go back to the previous metoprolol 25 mg daily. His blood pressure ranges in the low 50s all the way up to the 80s due to atrial fibrillation, I'm hesitant to increase the metoprolol also way up to 37.5, however I will increase to 25 mg twice a day for better rate control.   2. 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 Benay Spice with oncology, Dr. Benay Spice recommended adjuvant chemotherapy, however he is concern of potential interaction between Coumadin and Capecitabine. He recommended consideration of alternative therapy.  - His most recent GI bleed symptoms to be in the presence of post colectomy. I think it is reasonable to attempt eliquis as a trial. He has taken Xarelto's in the past, however eliquis may offer mildly lower GI bleeding risk. He understand that there is no reversal relation for eliquis and if he does bleed, potentially he will require transfusion again. Once he finished her chemotherapy, we can potentially consider switching him back to Coumadin depend on his bleeding risk. He wish Dr. Martinique to make this decision, therefore I will check with my supervising physician Dr. Martinique and was cleared will inform Dr. Benay Spice as well.  3. CAD s/p CABG: No obvious  angina.  4. History of prosthetic aortic valve: Last echocardiogram November 2015 showed stable valve position. He does not have any sign of volume overload.  5. Hypertension: Blood pressure is well-controlled.  6. History of GI bleed: Previously had a GI bleed in January 2016, recently underwent a robotic colectomy in February, at day after his discharge he returned with GI bleed requiring blood transfusion. He has not had any bleeding issues since. His hemoglobin has improved.     Medication Adjustments/Labs and Tests Ordered: Current medicines are reviewed at length with the patient today.  Concerns regarding medicines are outlined above.  Medication changes, Labs and Tests ordered today are listed in the Patient Instructions below. Patient Instructions  Medication Instructions:  INCREASE METOPROLOL TARTRATE 25 MG TWICE DAILY; NEW RX HAS BEEN SENT IN  Labwork: NONE ORDERED  Testing/Procedures: NONE ORDERED  Follow-Up: DR. Martinique IN 2-3 MONTHS  Any Other Special Instructions Will Be Listed Below (If Applicable).     If you need a refill on your cardiac medications before your next appointment, please call your pharmacy.      Hilbert Corrigan, [place]  11/14/2016 2:01 AM    [facility] [address], [place], [place]  [address] Phone: [phone]; Fax: [phone]

## 2016-11-13 NOTE — Patient Instructions (Signed)
Medication Instructions:  INCREASE METOPROLOL TARTRATE 25 MG TWICE DAILY; NEW RX HAS BEEN SENT IN  Labwork: NONE ORDERED  Testing/Procedures: NONE ORDERED  Follow-Up: DR. Martinique IN 2-3 MONTHS  Any Other Special Instructions Will Be Listed Below (If Applicable).     If you need a refill on your cardiac medications before your next appointment, please call your pharmacy.

## 2016-11-14 ENCOUNTER — Encounter: Payer: Self-pay | Admitting: Physician Assistant

## 2016-11-14 DIAGNOSIS — K922 Gastrointestinal hemorrhage, unspecified: Secondary | ICD-10-CM | POA: Diagnosis not present

## 2016-11-14 DIAGNOSIS — E611 Iron deficiency: Secondary | ICD-10-CM | POA: Diagnosis not present

## 2016-11-14 DIAGNOSIS — Z1389 Encounter for screening for other disorder: Secondary | ICD-10-CM | POA: Diagnosis not present

## 2016-11-14 DIAGNOSIS — I480 Paroxysmal atrial fibrillation: Secondary | ICD-10-CM | POA: Diagnosis not present

## 2016-11-14 DIAGNOSIS — Z6824 Body mass index (BMI) 24.0-24.9, adult: Secondary | ICD-10-CM | POA: Diagnosis not present

## 2016-11-14 DIAGNOSIS — C180 Malignant neoplasm of cecum: Secondary | ICD-10-CM | POA: Diagnosis not present

## 2016-11-15 ENCOUNTER — Other Ambulatory Visit: Payer: Self-pay | Admitting: *Deleted

## 2016-11-15 ENCOUNTER — Telehealth: Payer: Self-pay | Admitting: Physician Assistant

## 2016-11-15 DIAGNOSIS — C180 Malignant neoplasm of cecum: Secondary | ICD-10-CM

## 2016-11-15 NOTE — Telephone Encounter (Signed)
Attempted to reach patient, no answer, unable to advise.  I am not in clinic or triage this week. Routed to triage pool and pharmD for followup.

## 2016-11-15 NOTE — Telephone Encounter (Signed)
I saw Mr. Teale on 11/13/2016, he has a h/o upper GI bleed in 08/2014 underwent ablation and EGD for AVM. Recently underwent robotic colectomy for cecal cancer on 10/24/2016, discharged on 10/27/2016, however returned on the same day with GI bleed and requiring blood transfusion. This episode of bleeding to attributed to post op. He has since checked his CBC at primary care physician's office and per his report, hgb has been increasing.   Dr. Benay Spice recommended start adjuvant chemotherapy with Capecitabine however wish to consider alternative to coumadin due to potential interaction between coumadin and capecitabine with increase in INR. I think it is reasonable to do a trial of eliquis which does not have interaction with capecitabine. Patient wish for me to discuss with Dr. Martinique before proceeding.   I have discussed with Dr. Martinique, who is agreeable with eliquis 5mg  BID. Complicating part is he takes Coumadin. I have discussed with our clinical pharmacist who recommended recheck PT/INR before switching to eliquis 5mg  BID. We will send in the Rx, however patient will need to go to his primary care physician's office and obtain PT/INR before starting. Ideally eliquis 5mg  BID should be started as INR fall below 2.0, if it is 2.1-2.5, he may have to hold coumadin for a day before starting eliquis.   I tried to contact the patient to discuss this, but I could not reach him on both of his phone number. Will ask our staff to arrange. I will also forward this note to Dr. Joylene Draft and Dr. Benay Spice as well.  Hilbert Corrigan PA Pager: [phone]

## 2016-11-16 ENCOUNTER — Telehealth: Payer: Self-pay | Admitting: Pharmacist

## 2016-11-16 ENCOUNTER — Encounter: Payer: Self-pay | Admitting: *Deleted

## 2016-11-16 ENCOUNTER — Ambulatory Visit (HOSPITAL_BASED_OUTPATIENT_CLINIC_OR_DEPARTMENT_OTHER): Payer: Medicare Other | Admitting: Nurse Practitioner

## 2016-11-16 ENCOUNTER — Other Ambulatory Visit (HOSPITAL_BASED_OUTPATIENT_CLINIC_OR_DEPARTMENT_OTHER): Payer: Medicare Other

## 2016-11-16 ENCOUNTER — Other Ambulatory Visit: Payer: Medicare Other

## 2016-11-16 ENCOUNTER — Other Ambulatory Visit: Payer: Self-pay | Admitting: Pharmacist

## 2016-11-16 VITALS — BP 110/77 | HR 78 | Temp 98.0°F | Resp 18 | Ht 71.0 in | Wt 170.3 lb

## 2016-11-16 DIAGNOSIS — C180 Malignant neoplasm of cecum: Secondary | ICD-10-CM

## 2016-11-16 DIAGNOSIS — I25810 Atherosclerosis of coronary artery bypass graft(s) without angina pectoris: Secondary | ICD-10-CM

## 2016-11-16 DIAGNOSIS — I4891 Unspecified atrial fibrillation: Secondary | ICD-10-CM | POA: Diagnosis not present

## 2016-11-16 LAB — COMPREHENSIVE METABOLIC PANEL
ALT: 21 U/L (ref 0–55)
AST: 20 U/L (ref 5–34)
Albumin: 3.9 g/dL (ref 3.5–5.0)
Alkaline Phosphatase: 57 U/L (ref 40–150)
Anion Gap: 10 mEq/L (ref 3–11)
BUN: 10.9 mg/dL (ref 7.0–26.0)
CO2: 24 meq/L (ref 22–29)
Calcium: 9.4 mg/dL (ref 8.4–10.4)
Chloride: 105 mEq/L (ref 98–109)
Creatinine: 1 mg/dL (ref 0.7–1.3)
EGFR: 66 mL/min/{1.73_m2} — AB (ref >90)
GLUCOSE: 111 mg/dL (ref 70–140)
POTASSIUM: 4 meq/L (ref 3.5–5.1)
SODIUM: 139 meq/L (ref 136–145)
TOTAL PROTEIN: 7.4 g/dL (ref 6.4–8.3)
Total Bilirubin: 0.28 mg/dL (ref 0.20–1.20)

## 2016-11-16 LAB — CBC WITH DIFFERENTIAL/PLATELET
BASO%: 2 % (ref 0.0–2.0)
BASOS ABS: 0.1 10*3/uL (ref 0.0–0.1)
EOS%: 3.7 % (ref 0.0–7.0)
Eosinophils Absolute: 0.2 10*3/uL (ref 0.0–0.5)
HCT: 36.1 % — ABNORMAL LOW (ref 38.4–49.9)
HGB: 11.2 g/dL — ABNORMAL LOW (ref 13.0–17.1)
LYMPH%: 18.3 % (ref 14.0–49.0)
MCH: 27.7 pg (ref 27.2–33.4)
MCHC: 31 g/dL — ABNORMAL LOW (ref 32.0–36.0)
MCV: 89.4 fL (ref 79.3–98.0)
MONO#: 0.4 10*3/uL (ref 0.1–0.9)
MONO%: 8.1 % (ref 0.0–14.0)
NEUT%: 67.9 % (ref 39.0–75.0)
NEUTROS ABS: 3.1 10*3/uL (ref 1.5–6.5)
Platelets: 252 10*3/uL (ref 140–400)
RBC: 4.04 10*6/uL — ABNORMAL LOW (ref 4.20–5.82)
RDW: 20.9 % — ABNORMAL HIGH (ref 11.0–14.6)
WBC: 4.6 10*3/uL (ref 4.0–10.3)
lymph#: 0.8 10*3/uL — ABNORMAL LOW (ref 0.9–3.3)

## 2016-11-16 MED ORDER — PROCHLORPERAZINE MALEATE 5 MG PO TABS: 5.0000 mg | ORAL_TABLET | Freq: Four times a day (QID) | ORAL | 0 refills | Status: DC | PRN

## 2016-11-16 MED ORDER — CAPECITABINE 500 MG PO TABS: 1500.0000 mg | ORAL_TABLET | Freq: Two times a day (BID) | ORAL | 0 refills | Status: DC

## 2016-11-16 MED FILL — XELODA 500 MG TABLET: 500 | 14 days supply | Qty: 84 | Fill #0

## 2016-11-16 MED FILL — PROCHLORPERAZINE 5 MG TAB: 5 | 7 days supply | Qty: 30 | Fill #0

## 2016-11-16 NOTE — Progress Notes (Signed)
Entered handwritten capecitabine prescription Johny Drilling, PharmD, [place], [place] Pharmacy: [phone] Oral Chemo Clinic: [phone] 11/16/2016 8:52 AM

## 2016-11-16 NOTE — Telephone Encounter (Signed)
LMOM for Jason [name]/Jason [name] at [facility] to switch patient to Eliquis

## 2016-11-16 NOTE — Progress Notes (Addendum)
  Jason [name]   Diagnosis:  Colon cancer  INTERVAL HISTORY:   Jason [name] returns as scheduled. He feels well. He reports a good appetite. No pain. He denies any bleeding. No nausea or vomiting. He had an episode of loose stools after eating salad. Bowel habits have since returned to baseline.  Objective:  Vital signs in last 24 hours:  Blood pressure 110/77, pulse 78, temperature 98 F (36.7 C), temperature source Oral, resp. rate 18, height '5\' 11"'$  (1.803 m), weight 170 lb 4.8 oz (77.2 kg), SpO2 99 %.    HEENT: No thrush or ulcers. Resp: Lungs clear bilaterally. Cardio: Irregular. GI: Abdomen soft and nontender. No organomegaly. No mass. Small superficial opening at the left side of the low transverse incision. Vascular: No leg edema.    Lab Results:  Lab Results  Component Value Date   WBC 4.6 11/16/2016   HGB 11.2 (L) 11/16/2016   HCT 36.1 (L) 11/16/2016   MCV 89.4 11/16/2016   PLT 252 11/16/2016   NEUTROABS 3.1 11/16/2016    Imaging:  No results found.  Medications: I have reviewed the patient's current medications.  Assessment/Plan: 1. Colon cancer, cecum, stage IIIB (T4a,N1b), status post a right colectomy 10/24/2016  3/23 lymph nodes positive for metastatic adenocarcinoma  MSI-stable, no loss of mismatch repair protein expression  Cycle 1 adjuvant Xeloda 11/20/2016  2.   History of iron deficiency anemia secondary to #1, status post IV iron therapy and multiple red cell transfusions; hemoglobin improved 11/16/2016  3.   Prostate cancer followed by [name] with observation  4.  History of coronary artery disease, status post coronary artery bypass surgery 2011  5.  Aortic valve replacement 2011  6.  Atrial fibrillation-previously maintained on Coumadin; switched to Eliquis due to known interaction between Coumadin and Xeloda  7.  Glaucoma   Disposition: Jason [name] appears stable. He would like to  proceed with the recommended course of adjuvant Xeloda chemotherapy. We again reviewed potential toxicities. He will attend the chemotherapy education class today. He will begin cycle 1 Xeloda 11/20/2016. A prescription was sent to his pharmacy for Compazine 5 mg every 6 hours as needed. He will return for a follow-up visit on 12/04/2016. He will contact the office in the interim with any problems.  Patient seen with Dr. Benay Spice.    Ned Card ANP/GNP-BC   11/16/2016  9:51 AM This was a shared visit with Ned Card. Has decided to proceed with adjuvant Xeloda. I reviewed records from [name]. There is no gross evidence of metastatic prostate cancer. He has not been treated for prostate cancer. Jason [name] discontinued Coumadin and switched to Eliquis anticoagulation.    Julieanne Manson, M.D.

## 2016-11-16 NOTE — Telephone Encounter (Signed)
Oral Chemotherapy Pharmacist Encounter  Received notification from [facility] that copayment for Xeloda prescription is $0 with Medicare and supplement. They will call patient when ready for pick-up. Noted chemotherapy education scheduled with Alleta Casacchia today (11/16/16). Planned Xeloda start date 11/20/16  Oral oncology Clinic will continue to follow.  Johny Drilling, PharmD, BCPS, BCOP 11/16/2016  9:00 AM Oral Oncology Clinic [phone]

## 2016-11-22 ENCOUNTER — Telehealth: Payer: Self-pay | Admitting: Nurse Practitioner

## 2016-11-22 NOTE — Telephone Encounter (Signed)
Patient's wife called to have next scheduled appointments scheduled.

## 2016-11-29 ENCOUNTER — Telehealth: Payer: Self-pay | Admitting: *Deleted

## 2016-11-29 DIAGNOSIS — D500 Iron deficiency anemia secondary to blood loss (chronic): Secondary | ICD-10-CM | POA: Diagnosis not present

## 2016-11-29 NOTE — Telephone Encounter (Signed)
Call received from Avanell Shackleton stating that patient has had a deep cough that started on Monday and is concerned it may be related to Xeloda.  She states that pt has had no fevers, no pain, no SOB and is eating, drinking and sleeping without difficulties.  Dr. Benay Spice notified and Izora Gala notified that cough is not related to Xeloda and to contact [facility] if any fever, pain or other new symptoms arise.  Izora Gala has no further questions at this time.

## 2016-11-30 ENCOUNTER — Telehealth: Payer: Self-pay | Admitting: *Deleted

## 2016-11-30 NOTE — Telephone Encounter (Signed)
Reviewed wife's call with Dr. Benay Spice: Yes, follow PCP recommendation. Returned call to wife, she voiced understanding.

## 2016-11-30 NOTE — Telephone Encounter (Signed)
Message from pt's wife reporting "deep, wheezing cough." She called PCP's office and was told by on-call provider to take Levaquin for 5 days. She asks if Dr. Benay Spice agrees with this. Currently on Day 11 of Xeloda cycle.

## 2016-12-04 ENCOUNTER — Ambulatory Visit (HOSPITAL_BASED_OUTPATIENT_CLINIC_OR_DEPARTMENT_OTHER): Payer: Medicare Other | Admitting: Oncology

## 2016-12-04 ENCOUNTER — Telehealth: Payer: Self-pay | Admitting: Oncology

## 2016-12-04 ENCOUNTER — Other Ambulatory Visit (HOSPITAL_BASED_OUTPATIENT_CLINIC_OR_DEPARTMENT_OTHER): Payer: Medicare Other

## 2016-12-04 VITALS — BP 111/79 | HR 95 | Temp 98.7°F | Resp 17 | Ht 71.0 in | Wt 168.9 lb

## 2016-12-04 DIAGNOSIS — C180 Malignant neoplasm of cecum: Secondary | ICD-10-CM

## 2016-12-04 DIAGNOSIS — I25810 Atherosclerosis of coronary artery bypass graft(s) without angina pectoris: Secondary | ICD-10-CM | POA: Diagnosis not present

## 2016-12-04 DIAGNOSIS — I4891 Unspecified atrial fibrillation: Secondary | ICD-10-CM

## 2016-12-04 LAB — CBC WITH DIFFERENTIAL/PLATELET
BASO%: 0.8 % (ref 0.0–2.0)
BASOS ABS: 0 10*3/uL (ref 0.0–0.1)
EOS%: 1.6 % (ref 0.0–7.0)
Eosinophils Absolute: 0.1 10*3/uL (ref 0.0–0.5)
HCT: 37.7 % — ABNORMAL LOW (ref 38.4–49.9)
HGB: 12 g/dL — ABNORMAL LOW (ref 13.0–17.1)
LYMPH%: 20.8 % (ref 14.0–49.0)
MCH: 28.4 pg (ref 27.2–33.4)
MCHC: 31.8 g/dL — ABNORMAL LOW (ref 32.0–36.0)
MCV: 89.3 fL (ref 79.3–98.0)
MONO#: 0.4 10*3/uL (ref 0.1–0.9)
MONO%: 7.7 % (ref 0.0–14.0)
NEUT%: 69.1 % (ref 39.0–75.0)
NEUTROS ABS: 3.5 10*3/uL (ref 1.5–6.5)
Platelets: 175 10*3/uL (ref 140–400)
RBC: 4.22 10*6/uL (ref 4.20–5.82)
RDW: 23.2 % — AB (ref 11.0–14.6)
WBC: 5 10*3/uL (ref 4.0–10.3)
lymph#: 1.1 10*3/uL (ref 0.9–3.3)

## 2016-12-04 LAB — COMPREHENSIVE METABOLIC PANEL
ALK PHOS: 62 U/L (ref 40–150)
ALT: 18 U/L (ref 0–55)
AST: 21 U/L (ref 5–34)
Albumin: 4 g/dL (ref 3.5–5.0)
Anion Gap: 8 mEq/L (ref 3–11)
BUN: 11.1 mg/dL (ref 7.0–26.0)
CO2: 28 meq/L (ref 22–29)
Calcium: 9.4 mg/dL (ref 8.4–10.4)
Chloride: 105 mEq/L (ref 98–109)
Creatinine: 1 mg/dL (ref 0.7–1.3)
EGFR: 66 mL/min/{1.73_m2} — AB (ref >90)
GLUCOSE: 112 mg/dL (ref 70–140)
POTASSIUM: 3.8 meq/L (ref 3.5–5.1)
SODIUM: 141 meq/L (ref 136–145)
Total Bilirubin: 0.58 mg/dL (ref 0.20–1.20)
Total Protein: 7.6 g/dL (ref 6.4–8.3)

## 2016-12-04 NOTE — Telephone Encounter (Signed)
Follow up appointment scheduled with Lattie Haw on 04/16 @ 11:15, per 12/04/16 los. Patient was given a copy of the AVS report and appointment schedule per 12/04/16 los.

## 2016-12-04 NOTE — Progress Notes (Signed)
  Jason [name] OFFICE PROGRESS NOTE   Diagnosis: Colon cancer  INTERVAL HISTORY:   Jason [name] returns as scheduled. He began cycle 1 Xeloda on 11/20/2016. He has a sore the lower lip that he relates to a soup burn. No hand or foot pain. No diarrhea. He developed sinus drainage, cough, and wheezing last week. He was prescribed Levaquin by his primary physician. No fever or dyspnea. The respiratory symptoms have improved.  Objective:  Vital signs in last 24 hours:  Blood pressure 111/79, pulse 95, temperature 98.7 F (37.1 C), temperature source Oral, resp. rate 17, height '5\' 11"'$  (1.803 m), weight 168 lb 14.4 oz (76.6 kg), SpO2 97 %.    HEENT: No thrush, ulcer at the left palate and right side of the tongue. Ulceration at the lower lip. Resp: Scattered bilateral inspiratory rhonchi and expiratory wheeze, no respiratory distress Cardio: Irregular GI: No hepatosplenomegaly, nontender Vascular: No leg edema  Skin: Palms and soles without erythema or skin breakdown     Lab Results:  Lab Results  Component Value Date   WBC 5.0 12/04/2016   HGB 12.0 (L) 12/04/2016   HCT 37.7 (L) 12/04/2016   MCV 89.3 12/04/2016   PLT 175 12/04/2016   NEUTROABS 3.5 12/04/2016    Medications: I have reviewed the patient's current medications.  Assessment/Plan: 1. Colon cancer, cecum, stage IIIB (T4a,N1b), status post a right colectomy 10/24/2016  3/23 lymph nodes positive for metastatic adenocarcinoma  MSI-stable, no loss of mismatch repair protein expression  Cycle 1 adjuvant Xeloda 11/20/2016  2. History of iron deficiency anemia secondary to #1, status post IV iron therapy and multiple red cell transfusions; hemoglobin improved 11/16/2016  3. Prostate cancer followed by Dr. Riesa Pope observation  4. History of coronary artery disease, status post coronary artery bypass surgery 2011  5. Aortic valve replacement 2011  6. Atrial fibrillation-previously  maintained on Coumadin; switched to Eliquis due to known interaction between Coumadin and Xeloda  7. Glaucoma  8.  Mucositis secondary to Xeloda following cycle 1    Disposition:  Jason [name] completed a first cycle of Xeloda. He is recovering from an upper respiratory infection. He appears to have early mucositis. He will use salt water and Biotene rinse. He will return for an office visit prior to the next cycle of Xeloda on 12/11/2016.  15 minutes were spent with the patient today. The majority of the time was used for counseling and coordination of care.  Betsy Coder, MD  12/04/2016  10:18 AM

## 2016-12-11 ENCOUNTER — Ambulatory Visit (HOSPITAL_BASED_OUTPATIENT_CLINIC_OR_DEPARTMENT_OTHER): Payer: Medicare Other | Admitting: Nurse Practitioner

## 2016-12-11 ENCOUNTER — Telehealth: Payer: Self-pay | Admitting: Oncology

## 2016-12-11 VITALS — BP 119/59 | HR 76 | Temp 98.0°F | Resp 18 | Wt 173.7 lb

## 2016-12-11 DIAGNOSIS — C61 Malignant neoplasm of prostate: Secondary | ICD-10-CM

## 2016-12-11 DIAGNOSIS — I25810 Atherosclerosis of coronary artery bypass graft(s) without angina pectoris: Secondary | ICD-10-CM | POA: Diagnosis not present

## 2016-12-11 DIAGNOSIS — I4891 Unspecified atrial fibrillation: Secondary | ICD-10-CM

## 2016-12-11 DIAGNOSIS — C180 Malignant neoplasm of cecum: Secondary | ICD-10-CM

## 2016-12-11 MED ORDER — CAPECITABINE 500 MG PO TABS: 1000.0000 mg | ORAL_TABLET | Freq: Two times a day (BID) | ORAL | 0 refills | Status: DC

## 2016-12-11 MED FILL — XELODA 500 MG TABLET: 500 | 14 days supply | Qty: 56 | Fill #0

## 2016-12-11 NOTE — Telephone Encounter (Signed)
Appointments scheduled per 12/11/16 los. Patient ws given a copy of the AVS report and appointment schedule per 12/11/16 los.

## 2016-12-11 NOTE — Progress Notes (Signed)
  [place] OFFICE PROGRESS NOTE   Diagnosis: Colon cancer   INTERVAL HISTORY:   [name] returns as scheduled. He completed cycle 1 Xeloda beginning 11/20/2016. He was noted to have mucositis at the time of his last visit. He is seen today for reevaluation prior to beginning cycle 2 Xeloda.  He denies any mouth sores/soreness. No nausea or vomiting. He has loose stools but does not feel this is diarrhea. At the most he has 3 bowel movements a day. Typically he has 2 a day. No hand or foot pain or redness.  Objective:  Vital signs in last 24 hours:  Blood pressure (!) 119/59, pulse 76, temperature 98 F (36.7 C), temperature source Oral, resp. rate 18, weight 173 lb 11.2 oz (78.8 kg), SpO2 97 %.    HEENT: No thrush. Small healing ulceration right lateral tongue. No other areas of ulceration. Specifically no ulceration at the left palate or lower lip. Resp: Rhonchi left lower lung field. No respiratory distress. Cardio: Irregular. GI: Abdomen soft and nontender. No organomegaly. Vascular: No leg edema. Skin: Palms without erythema.    Lab Results:  Lab Results  Component Value Date   WBC 5.0 12/04/2016   HGB 12.0 (L) 12/04/2016   HCT 37.7 (L) 12/04/2016   MCV 89.3 12/04/2016   PLT 175 12/04/2016   NEUTROABS 3.5 12/04/2016    Imaging:  No results found.  Medications: I have reviewed the patient's current medications.  Assessment/Plan: 1. Colon cancer, cecum, stage IIIB (T4a,N1b), status post a right colectomy 10/24/2016  3/23 lymph nodes positive for metastatic adenocarcinoma  MSI-stable, no loss of mismatch repair protein expression  Cycle 1 adjuvant Xeloda 11/20/2016  Cycle 2 adjuvant Xeloda 12/12/2016 (dose reduced due to mucositis following cycle 1)  2. History of iron deficiency anemia secondary to #1, status post IV iron therapy and multiple red cell transfusions; hemoglobin improved 11/16/2016  3. Prostate cancer followed by Dr.  Riesa Pope observation  4. History of coronary artery disease, status post coronary artery bypass surgery 2011  5. Aortic valve replacement 2011  6. Atrial fibrillation-previously maintained on Coumadin;switched to Eliquisdue to known interaction between Coumadin and Xeloda  7. Glaucoma  8.  Mucositis secondary to Xeloda following cycle 1; Xeloda dose reduced beginning with cycle 2   Disposition: Mr. Sweitzer appears stable. He developed mucositis following cycle 1 Xeloda. He continues to have a small area of healing ulceration at the right lateral tongue. He will begin cycle 2 Xeloda beginning 12/12/2016 at a reduced dose of 1000 mg twice daily for 14 days. He understands to discontinue Xeloda and contact the office with recurrent mucositis.  He will return for labs and a follow-up visit on 12/27/2016. He will contact the office in the interim with any problems.  Plan reviewed with Dr. Benay Spice.    Ned Card ANP/GNP-BC   12/11/2016  11:44 AM

## 2016-12-27 ENCOUNTER — Ambulatory Visit (HOSPITAL_BASED_OUTPATIENT_CLINIC_OR_DEPARTMENT_OTHER): Payer: Medicare Other | Admitting: Oncology

## 2016-12-27 ENCOUNTER — Other Ambulatory Visit (HOSPITAL_BASED_OUTPATIENT_CLINIC_OR_DEPARTMENT_OTHER): Payer: Medicare Other

## 2016-12-27 ENCOUNTER — Telehealth: Payer: Self-pay | Admitting: Oncology

## 2016-12-27 ENCOUNTER — Other Ambulatory Visit: Payer: Self-pay | Admitting: *Deleted

## 2016-12-27 VITALS — BP 120/71 | HR 93 | Temp 99.1°F | Resp 18 | Wt 174.6 lb

## 2016-12-27 DIAGNOSIS — C180 Malignant neoplasm of cecum: Secondary | ICD-10-CM | POA: Diagnosis not present

## 2016-12-27 DIAGNOSIS — I4891 Unspecified atrial fibrillation: Secondary | ICD-10-CM | POA: Diagnosis not present

## 2016-12-27 DIAGNOSIS — C61 Malignant neoplasm of prostate: Secondary | ICD-10-CM

## 2016-12-27 DIAGNOSIS — I25810 Atherosclerosis of coronary artery bypass graft(s) without angina pectoris: Secondary | ICD-10-CM | POA: Diagnosis not present

## 2016-12-27 LAB — COMPREHENSIVE METABOLIC PANEL
ALT: 17 U/L (ref 0–55)
AST: 22 U/L (ref 5–34)
Albumin: 3.9 g/dL (ref 3.5–5.0)
Alkaline Phosphatase: 57 U/L (ref 40–150)
Anion Gap: 10 mEq/L (ref 3–11)
BUN: 15.2 mg/dL (ref 7.0–26.0)
CO2: 26 meq/L (ref 22–29)
Calcium: 9.2 mg/dL (ref 8.4–10.4)
Chloride: 107 mEq/L (ref 98–109)
Creatinine: 1.1 mg/dL (ref 0.7–1.3)
EGFR: 60 mL/min/{1.73_m2} — AB (ref >90)
GLUCOSE: 105 mg/dL (ref 70–140)
POTASSIUM: 4.2 meq/L (ref 3.5–5.1)
SODIUM: 143 meq/L (ref 136–145)
Total Bilirubin: 0.57 mg/dL (ref 0.20–1.20)
Total Protein: 7.4 g/dL (ref 6.4–8.3)

## 2016-12-27 LAB — CBC WITH DIFFERENTIAL/PLATELET
BASO%: 1 % (ref 0.0–2.0)
Basophils Absolute: 0.1 10*3/uL (ref 0.0–0.1)
EOS ABS: 0.1 10*3/uL (ref 0.0–0.5)
EOS%: 1 % (ref 0.0–7.0)
HCT: 38.8 % (ref 38.4–49.9)
HGB: 12.4 g/dL — ABNORMAL LOW (ref 13.0–17.1)
LYMPH%: 19.3 % (ref 14.0–49.0)
MCH: 30.5 pg (ref 27.2–33.4)
MCHC: 32 g/dL (ref 32.0–36.0)
MCV: 95.2 fL (ref 79.3–98.0)
MONO#: 0.7 10*3/uL (ref 0.1–0.9)
MONO%: 8.6 % (ref 0.0–14.0)
NEUT%: 70.1 % (ref 39.0–75.0)
NEUTROS ABS: 5.4 10*3/uL (ref 1.5–6.5)
Platelets: 162 10*3/uL (ref 140–400)
RBC: 4.07 10*6/uL — AB (ref 4.20–5.82)
RDW: 29.5 % — ABNORMAL HIGH (ref 11.0–14.6)
WBC: 7.7 10*3/uL (ref 4.0–10.3)
lymph#: 1.5 10*3/uL (ref 0.9–3.3)

## 2016-12-27 MED ORDER — CAPECITABINE 500 MG PO TABS: 1000.0000 mg | ORAL_TABLET | Freq: Two times a day (BID) | ORAL | 0 refills | Status: DC

## 2016-12-27 MED FILL — XELODA 500 MG TABLET: 500 | 14 days supply | Qty: 56 | Fill #0

## 2016-12-27 NOTE — Progress Notes (Signed)
  [place] OFFICE PROGRESS NOTE   Diagnosis: Colon cancer  INTERVAL HISTORY:   Mr. Curto begin another cycle of adjuvant Xeloda on 12/12/2016. No mouth sores or diarrhea with this cycle. No hand or foot pain. He has soreness at the right nostril. This has occurred in the past. He is exercising. No bleeding.  Objective:  Vital signs in last 24 hours:  Blood pressure 120/71, pulse 93, temperature 99.1 F (37.3 C), temperature source Oral, resp. rate 18, weight 174 lb 9.6 oz (79.2 kg), SpO2 98 %.    HEENT: Erythema with a small ulceration at the right nasal septum. Oral cavity without thrush or ulcers. Resp: Lungs clear bilaterally Cardio: Irregular GI: No hepatosplenomegaly, nontender Vascular: No leg edema  Skin: Mild hyperpigmentation of the hands    Lab Results:  Lab Results  Component Value Date   WBC 7.7 12/27/2016   HGB 12.4 (L) 12/27/2016   HCT 38.8 12/27/2016   MCV 95.2 12/27/2016   PLT 162 12/27/2016   NEUTROABS 5.4 12/27/2016    CMP     Component Value Date/Time   NA 143 12/27/2016 1104   K 4.2 12/27/2016 1104   CL 104 11/01/2016 0548   CO2 26 12/27/2016 1104   GLUCOSE 105 12/27/2016 1104   BUN 15.2 12/27/2016 1104   CREATININE 1.1 12/27/2016 1104   CALCIUM 9.2 12/27/2016 1104   PROT 7.4 12/27/2016 1104   ALBUMIN 3.9 12/27/2016 1104   AST 22 12/27/2016 1104   ALT 17 12/27/2016 1104   ALKPHOS 57 12/27/2016 1104   BILITOT 0.57 12/27/2016 1104   GFRNONAA >60 11/01/2016 0548   GFRAA >60 11/01/2016 0548    Medications: I have reviewed the patient's current medications.  Assessment/Plan: 1. Colon cancer, cecum, stage IIIB (T4a,N1b), status post a right colectomy 10/24/2016  3/23 lymph nodes positive for metastatic adenocarcinoma  MSI-stable, no loss of mismatch repair protein expression  Cycle 1 adjuvant Xeloda 11/20/2016  Cycle 2 adjuvant Xeloda 12/12/2016 (dose reduced due to mucositis following cycle 1)  Cycle 3 adjuvant  Xeloda 01/02/2017  2. History of iron deficiency anemia secondary to #1, status post IV iron therapy and multiple red cell transfusions; hemoglobin improved 11/16/2016  3. Prostate cancer followed by Dr. Riesa Pope observation  4. History of coronary artery disease, status post coronary artery bypass surgery 2011  5. Aortic valve replacement 2011  6. Atrial fibrillation-previously maintained on Coumadin;switched to Eliquisdue to known interaction between Coumadin and Xeloda  7. Glaucoma  8. Mucositis secondary to Xeloda following cycle 1; Xeloda dose reduced beginning with cycle 2   Disposition:  Mr. Massar tolerated the second cycle of Xeloda well. He will begin another cycle of Xeloda at the current dose on 01/02/2017. He has a small ulcer at the right nasal septum. He will contact us if this worsens. He reports having similar ulcers in the past, but this may be related to Xeloda. He will return for an office and lab visit in 3 weeks.  15 minutes were spent with the patient today. The majority of the time was used for counseling and coordination of care.  Betsy Coder, MD  12/27/2016  12:55 PM

## 2016-12-27 NOTE — Telephone Encounter (Signed)
Gave patient AVS and calender per 5/2 los.  

## 2016-12-28 ENCOUNTER — Telehealth: Payer: Self-pay | Admitting: *Deleted

## 2016-12-28 NOTE — Telephone Encounter (Signed)
Message from pt's wife reporting he has had several episodes of vomiting today. Last dose of Xeloda was 4/30. Pt has been feeling well, ate breakfast and went outside for a while this morning before becoming nauseated. She reports he has had a few loose stools but no diarrhea. Discussed call with Dr. Benay Spice: Continue Compazine for nausea. Call office if it persists.  Called wife with above instructions. She voiced understanding.

## 2017-01-17 ENCOUNTER — Other Ambulatory Visit (HOSPITAL_BASED_OUTPATIENT_CLINIC_OR_DEPARTMENT_OTHER): Payer: Medicare Other

## 2017-01-17 ENCOUNTER — Ambulatory Visit (HOSPITAL_BASED_OUTPATIENT_CLINIC_OR_DEPARTMENT_OTHER): Payer: Medicare Other | Admitting: Nurse Practitioner

## 2017-01-17 ENCOUNTER — Telehealth: Payer: Self-pay | Admitting: Oncology

## 2017-01-17 VITALS — BP 133/75 | HR 86 | Temp 98.0°F | Resp 20 | Ht 71.0 in | Wt 174.4 lb

## 2017-01-17 DIAGNOSIS — C61 Malignant neoplasm of prostate: Secondary | ICD-10-CM

## 2017-01-17 DIAGNOSIS — C180 Malignant neoplasm of cecum: Secondary | ICD-10-CM | POA: Diagnosis not present

## 2017-01-17 DIAGNOSIS — I25810 Atherosclerosis of coronary artery bypass graft(s) without angina pectoris: Secondary | ICD-10-CM

## 2017-01-17 LAB — CBC WITH DIFFERENTIAL/PLATELET
BASO%: 1.2 % (ref 0.0–2.0)
BASOS ABS: 0.1 10*3/uL (ref 0.0–0.1)
EOS%: 2.3 % (ref 0.0–7.0)
Eosinophils Absolute: 0.1 10*3/uL (ref 0.0–0.5)
HEMATOCRIT: 40.7 % (ref 38.4–49.9)
HEMOGLOBIN: 13.4 g/dL (ref 13.0–17.1)
LYMPH#: 1.4 10*3/uL (ref 0.9–3.3)
LYMPH%: 21 % (ref 14.0–49.0)
MCH: 32.1 pg (ref 27.2–33.4)
MCHC: 32.9 g/dL (ref 32.0–36.0)
MCV: 97.7 fL (ref 79.3–98.0)
MONO#: 0.7 10*3/uL (ref 0.1–0.9)
MONO%: 10.9 % (ref 0.0–14.0)
NEUT%: 64.6 % (ref 39.0–75.0)
NEUTROS ABS: 4.2 10*3/uL (ref 1.5–6.5)
Platelets: 147 10*3/uL (ref 140–400)
RBC: 4.16 10*6/uL — ABNORMAL LOW (ref 4.20–5.82)
RDW: 29.1 % — AB (ref 11.0–14.6)
WBC: 6.5 10*3/uL (ref 4.0–10.3)

## 2017-01-17 MED ORDER — CAPECITABINE 500 MG PO TABS: 1000.0000 mg | ORAL_TABLET | Freq: Two times a day (BID) | ORAL | 0 refills | Status: DC

## 2017-01-17 NOTE — Progress Notes (Signed)
  [place] OFFICE PROGRESS NOTE   Diagnosis:  Colon cancer  INTERVAL HISTORY:   [name] returns as scheduled. He completed cycle 3 adjuvant Xeloda beginning 01/02/2017. He had a single episode of nausea/vomiting during the most recent cycle. He thinks this was unrelated to Xeloda. No mouth sores. Nasal sore has resolved. He estimates 3 loose stools a day since surgery. No increased since beginning Xeloda. No hand or foot pain or redness.  Objective:  Vital signs in last 24 hours:  Blood pressure 133/75, pulse 86, temperature 98 F (36.7 C), temperature source Oral, resp. rate 20, height _0  (1.803 m), weight 174 lb 6.4 oz (79.1 kg), SpO2 98 %.    HEENT: No thrush or ulcers. Resp: Lungs clear bilaterally. Cardio: Regular rate and rhythm. GI: Abdomen soft and nontender. No hepatomegaly. Vascular: No leg edema. Skin: Palms without erythema.    Lab Results:  Lab Results  Component Value Date   WBC 6.5 01/17/2017   HGB 13.4 01/17/2017   HCT 40.7 01/17/2017   MCV 97.7 01/17/2017   PLT 147 01/17/2017   NEUTROABS 4.2 01/17/2017    Imaging:  No results found.  Medications: I have reviewed the patient's current medications.  Assessment/Plan: 1. Colon cancer, cecum, stage IIIB (T4a,N1b), status post a right colectomy 10/24/2016  3/23 lymph nodes positive for metastatic adenocarcinoma  MSI-stable, no loss of mismatch repair protein expression  Cycle 1 adjuvant Xeloda 11/20/2016  Cycle 2 adjuvant Xeloda 12/12/2016 (dose reduced due to mucositis following cycle 1)  Cycle 3 adjuvant Xeloda 01/02/2017  Cycle 4 adjuvant Xeloda 01/23/2017  2. History of iron deficiency anemia secondary to #1, status post IV iron therapy and multiple red cell transfusions; hemoglobin improved 11/16/2016; hemoglobin normal range 01/17/2017  3. Prostate cancer followed by Dr. Riesa Pope observation  4. History of coronary artery disease, status post coronary  artery bypass surgery 2011  5. Aortic valve replacement 2011  6. Atrial fibrillation-previously maintained on Coumadin;switched to Eliquisdue to known interaction between Coumadin and Xeloda  7. Glaucoma  8. Mucositis secondary to Xeloda following cycle 1; Xeloda dose reduced beginning with cycle 2.    Disposition: [name] appears stable. He has completed 3 cycles of adjuvant Xeloda. He seems to be tolerating the Xeloda well. Plan to proceed with cycle 4 as scheduled beginning 01/23/2017. He will return for a follow-up visit in 3 weeks. He will contact the office in the interim with any problems.    Ned Card ANP/GNP-BC   01/17/2017  10:22 AM

## 2017-01-17 NOTE — Telephone Encounter (Signed)
Gave patient AVS and calender per 5/23 los - per GBS okay for lab appt after MD visit.

## 2017-01-18 MED FILL — XELODA 500 MG TABLET: 500 | 21 days supply | Qty: 56 | Fill #0

## 2017-02-06 ENCOUNTER — Other Ambulatory Visit (HOSPITAL_BASED_OUTPATIENT_CLINIC_OR_DEPARTMENT_OTHER): Payer: Medicare Other

## 2017-02-06 ENCOUNTER — Telehealth: Payer: Self-pay | Admitting: Oncology

## 2017-02-06 ENCOUNTER — Ambulatory Visit (HOSPITAL_BASED_OUTPATIENT_CLINIC_OR_DEPARTMENT_OTHER): Payer: Medicare Other | Admitting: Oncology

## 2017-02-06 ENCOUNTER — Other Ambulatory Visit: Payer: Self-pay | Admitting: *Deleted

## 2017-02-06 VITALS — BP 113/66 | HR 76 | Temp 98.0°F | Resp 18 | Ht 71.0 in | Wt 175.0 lb

## 2017-02-06 DIAGNOSIS — C180 Malignant neoplasm of cecum: Secondary | ICD-10-CM | POA: Diagnosis not present

## 2017-02-06 DIAGNOSIS — K1231 Oral mucositis (ulcerative) due to antineoplastic therapy: Secondary | ICD-10-CM

## 2017-02-06 DIAGNOSIS — I25810 Atherosclerosis of coronary artery bypass graft(s) without angina pectoris: Secondary | ICD-10-CM

## 2017-02-06 LAB — CBC WITH DIFFERENTIAL/PLATELET
BASO%: 0.9 % (ref 0.0–2.0)
Basophils Absolute: 0.1 10e3/uL (ref 0.0–0.1)
EOS%: 2.1 % (ref 0.0–7.0)
Eosinophils Absolute: 0.1 10e3/uL (ref 0.0–0.5)
HCT: 42.6 % (ref 38.4–49.9)
HGB: 13.7 g/dL (ref 13.0–17.1)
LYMPH%: 31.2 % (ref 14.0–49.0)
MCH: 32.1 pg (ref 27.2–33.4)
MCHC: 32.2 g/dL (ref 32.0–36.0)
MCV: 99.8 fL — ABNORMAL HIGH (ref 79.3–98.0)
MONO#: 0.7 10e3/uL (ref 0.1–0.9)
MONO%: 12.2 % (ref 0.0–14.0)
NEUT#: 3.1 10e3/uL (ref 1.5–6.5)
NEUT%: 53.6 % (ref 39.0–75.0)
Platelets: 156 10e3/uL (ref 140–400)
RBC: 4.27 10e6/uL (ref 4.20–5.82)
RDW: 23 % — ABNORMAL HIGH (ref 11.0–14.6)
WBC: 5.8 10e3/uL (ref 4.0–10.3)
lymph#: 1.8 10e3/uL (ref 0.9–3.3)

## 2017-02-06 LAB — COMPREHENSIVE METABOLIC PANEL WITH GFR
ALT: 26 U/L (ref 0–55)
AST: 42 U/L — ABNORMAL HIGH (ref 5–34)
Albumin: 4.2 g/dL (ref 3.5–5.0)
Alkaline Phosphatase: 54 U/L (ref 40–150)
Anion Gap: 7 meq/L (ref 3–11)
BUN: 16 mg/dL (ref 7.0–26.0)
CO2: 28 meq/L (ref 22–29)
Calcium: 9.9 mg/dL (ref 8.4–10.4)
Chloride: 104 meq/L (ref 98–109)
Creatinine: 1.1 mg/dL (ref 0.7–1.3)
EGFR: 59 ml/min/1.73 m2 — ABNORMAL LOW (ref >90)
Glucose: 111 mg/dL (ref 70–140)
Potassium: 4.4 meq/L (ref 3.5–5.1)
Sodium: 138 meq/L (ref 136–145)
Total Bilirubin: 0.73 mg/dL (ref 0.20–1.20)
Total Protein: 7.9 g/dL (ref 6.4–8.3)

## 2017-02-06 MED ORDER — CAPECITABINE 500 MG PO TABS: 1000.0000 mg | ORAL_TABLET | Freq: Two times a day (BID) | ORAL | 0 refills | Status: DC

## 2017-02-06 MED FILL — XELODA 500 MG TABLET: 500 | 21 days supply | Qty: 56 | Fill #0

## 2017-02-06 NOTE — Progress Notes (Signed)
  [place] OFFICE PROGRESS NOTE   Diagnosis: Colon cancer  INTERVAL HISTORY:   Jason [name] completed another cycle of Xeloda beginning 01/23/2017. He developed one sore at the lower lip. He continues to have 2-3 loose stools per day. This has not changed. No hand or foot pain. Good appetite. He has noted hallucinations when sitting in the [place] [facility]. He sees people walking and the distance that are not there.  Objective:  Vital signs in last 24 hours:  Blood pressure 113/66, pulse 76, temperature 98 F (36.7 C), temperature source Oral, resp. rate 18, height '5\' 11"'$  (1.803 m), weight 175 lb (79.4 kg), SpO2 92 %.    HEENT: No thrush or ulcers Resp: Bilateral expiratory rhonchi/wheeze, good air movement bilaterally, no respiratory distress Cardio: Irregular GI: No hepatosplenomegaly, nontender Vascular: No leg edema Neuro: Alert and oriented  Skin: Palms and soles without erythema or skin breakdown     Lab Results:  Lab Results  Component Value Date   WBC 6.5 01/17/2017   HGB 13.4 01/17/2017   HCT 40.7 01/17/2017   MCV 97.7 01/17/2017   PLT 147 01/17/2017   NEUTROABS 4.2 01/17/2017    CMP     Component Value Date/Time   NA 143 12/27/2016 1104   K 4.2 12/27/2016 1104   CL 104 11/01/2016 0548   CO2 26 12/27/2016 1104   GLUCOSE 105 12/27/2016 1104   BUN 15.2 12/27/2016 1104   CREATININE 1.1 12/27/2016 1104   CALCIUM 9.2 12/27/2016 1104   PROT 7.4 12/27/2016 1104   ALBUMIN 3.9 12/27/2016 1104   AST 22 12/27/2016 1104   ALT 17 12/27/2016 1104   ALKPHOS 57 12/27/2016 1104   BILITOT 0.57 12/27/2016 1104   GFRNONAA >60 11/01/2016 0548   GFRAA >60 11/01/2016 0548   Medications: I have reviewed the patient's current medications.  Assessment/Plan: 1. Colon cancer, cecum, stage IIIB (T4a,N1b), status post a right colectomy 10/24/2016  3/23 lymph nodes positive for metastatic adenocarcinoma  MSI-stable, no loss of mismatch repair  protein expression  Cycle 1 adjuvant Xeloda 11/20/2016  Cycle 2 adjuvant Xeloda 12/12/2016 (dose reduced due to mucositis following cycle 1)  Cycle 3 adjuvant Xeloda 01/02/2017  Cycle 4 adjuvant Xeloda 01/23/2017  Cycle 5 adjuvant Xeloda 02/13/2017  2. History of iron deficiency anemia secondary to #1, status post IV iron therapy and multiple red cell transfusions; hemoglobin improved 11/16/2016; hemoglobin normal range 01/17/2017  3. Prostate cancer followed by Dr. Riesa Pope observation  4. History of coronary artery disease, status post coronary artery bypass surgery 2011  5. Aortic valve replacement 2011  6. Atrial fibrillation-previously maintained on Coumadin;switched to Eliquisdue to known interaction between Coumadin and Xeloda  7. Glaucoma  8. Mucositis secondary to Xeloda following cycle 1; Xeloda dose reduced beginning with cycle 2.    Disposition:  He appears stable. He will begin cycle 5 adjuvant Xeloda 02/13/2017. We will consider dose escalating Xeloda by one pill daily with cycle 6.  Jason [name] will return for an office visit on 03/01/2017. We will follow-up on the CBC from today.  I doubt the hallucinations are related to the colon cancer diagnosis or Xeloda.  Donneta Romberg, MD  02/06/2017  8:59 AM

## 2017-02-06 NOTE — Telephone Encounter (Signed)
Scheduled appt per 6/12 los - Gave patient AVS and calender per 6/12 los.

## 2017-02-11 NOTE — Progress Notes (Signed)
[place] Date of Birth: Mar 19, 1926   History of Present Illness: Jason [name] is seen today for follow up of atrial fibrillation. He is status post aortic valve replacement and coronary bypass surgery in October of 2011. He has a tissue valve prosthesis. He had a LIMA to the LAD. The RCA had 40-50% stenosis treated medically.  He has  atrial fibrillation. He was treated with rate control and anticoagulation with Xarelto. In January 2016 he was admitted with a GIB. Xarelto was held. Hbg dropped to 9.7. he did not require transfusion. 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 Since then he has been on coumadin. He reports today that in May 2017 his Hgb dropped. He was transfused.  No obvious bleeding.  He was started on iron infusions.  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 He was discharged on 10/27/2016, however returned the same afternoon with complaint of GI bleed. He was seen by Dr. Curt Bears on 10/29/2016, his beta blocker was increased to help rate control with his permanent atrial fibrillation. There was some wide complex tachycardia c/w aberrancy versus VT.  He was started on Xeloda and due to interaction with Coumadin he was switched to Eliquis. Hgb has been stable. He reports last Hgb 13.7.  He is doing well today. Denies any chest pain, SOB, palpitations, dizziness. Stays active gardening, goes to the gym 3x/week and exercises for an hour. Tolerating chemo well.   Current Outpatient Prescriptions on File Prior to Visit  Medication Sig Dispense Refill  . acetaminophen (TYLENOL) 500 MG tablet Take 500 mg by mouth every 6 (six) hours as needed for moderate pain.     [name] apixaban (ELIQUIS) 5 MG TABS tablet Take 5 mg by mouth 2 (two) times daily.    . Calcium Carbonate-Vitamin D (CALCIUM-VITAMIN D) 500-200 MG-UNIT per tablet Take 1 tablet by mouth daily.    . capecitabine (XELODA) 500 MG tablet Take 2 tablets (1,000 mg  total) by mouth 2 (two) times daily after a meal. Take for 14 days on, 7 days off Start 02/13/17 56 tablet 0  . Cholecalciferol 1000 UNITS capsule Take 1,000 Units by mouth daily.     . dorzolamide (TRUSOPT) 2 % ophthalmic solution PLACE 1 DROP INTO BOTH EYES 3 TIMES DAILY.  11  . fluticasone (FLONASE) 50 MCG/ACT nasal spray Place 1 spray into both nostrils daily as needed for allergies or rhinitis.     [name] glucosamine-chondroitin 500-400 MG tablet Take 1 tablet by mouth 2 (two) times daily.    [name] latanoprost (XALATAN) 0.005 % ophthalmic solution Place 1 drop into both eyes daily.  11  . Multiple Vitamin (MULTIVITAMIN WITH MINERALS) TABS tablet Take 1 tablet by mouth daily.    [name] omega-3 acid ethyl esters (LOVAZA) 1 G capsule Take 1 g by mouth daily.     [name] perphenazine-amitriptyline (ETRAFON/TRIAVIL) 4-25 MG TABS Take 1 tablet by mouth at bedtime.    . prednisoLONE acetate (PRED FORTE) 1 % ophthalmic suspension Place 1 drop into the right eye daily at 12 noon.     . prochlorperazine (COMPAZINE) 5 MG tablet Take 1 tablet (5 mg total) by mouth every 6 (six) hours as needed for nausea or vomiting. 30 tablet 0  . ranitidine (ZANTAC) 150 MG tablet Take 150 mg by mouth daily.    . rosuvastatin (CRESTOR) 5 MG tablet Take 5 mg by mouth every other day.     [name]  traMADol (ULTRAM) 50 MG tablet Take 1-2 tablets (50-100 mg total) by mouth every 6 (six) hours as needed for moderate pain or severe pain. 30 tablet 0  . vitamin B-12 (CYANOCOBALAMIN) 1000 MCG tablet Take 1,000 mcg by mouth daily.    . metoprolol tartrate (LOPRESSOR) 25 MG tablet Take 1 tablet (25 mg total) by mouth 2 (two) times daily. 180 tablet 3   No current facility-administered medications on file prior to visit.     No Known Allergies  Past Medical History:  Diagnosis Date  . Abnormal PSA   . Adenomatous polyp of colon 2010 and before 2006  . Anemia   . Aortic stenosis   . Aortic valve prosthesis present   . Atrial fibrillation ([place])   .  CAD (coronary artery disease)   . DDD (degenerative disc disease), lumbar   . Depression   . Diverticulosis   . Dyslipidemia   . GERD (gastroesophageal reflux disease)   . Glaucoma   . History of blood transfusion 09/29/2016  . Hx of CABG   . Hypogonadism, male   . Macular degeneration   . OSA (obstructive sleep apnea)   . Osteoarthritis   . Osteopenia   . Prostate CA [facility]) prostate 2007   colon dx 2018, watching psa levels, no tx yet for prostate    Past Surgical History:  Procedure Laterality Date  . AORTIC VALVE REPLACEMENT  October 2011   Magna Ease pericardial tissue valve #32mm  . [employer]   lower  . CATARACT EXTRACTION Bilateral   . CORNEAL TRANSPLANT Right   . CORONARY ARTERY BYPASS GRAFT  October 2011   LIMA to LAD  . ESOPHAGOGASTRODUODENOSCOPY N/A 09/16/2014   Procedure: ESOPHAGOGASTRODUODENOSCOPY (EGD);  Surgeon: [name], MD;  Location: [facility] ENDOSCOPY;  Service: Endoscopy;  Laterality: N/A;  . TONSILLECTOMY  76 yrs ago    History  Smoking Status  . Former Smoker  . Packs/day: 1.00  . Years: 40.00  . Types: Cigarettes  . Quit date: 11/16/1990  Smokeless Tobacco  . Never Used    History  Alcohol Use  . 1.2 oz/week  . 2 Glasses of wine per week    Comment: wine few x per week    Family History  Problem Relation Age of Onset  . Heart attack Father   . Colon cancer Neg Hx     Review of Systems: As noted in history of present illness. s All other systems were reviewed and are negative.  Physical Exam: BP 118/70   Pulse 82   Ht 5\' 11"  (1.803 m)   Wt 174 lb (78.9 kg)   BMI 24.27 kg/m  Patient is very pleasant and in no acute distress. HEENT is unremarkable. Normocephalic/atraumatic. PERRL. Sclera are nonicteric. Neck is supple. No masses. No JVD. Lungs are clear. Cardiac exam shows an irregularly irregular rate and rhythm.  He has a soft 1-2/6 aortic outflow murmur.   Abdomen is soft. Extremities are without edema. Gait and ROM are  intact. No gross neurologic deficits noted.  LABORATORY DATA:    Labs dated 07/02/15: cholesterol 97, triglycerides 176, LDL 30, HDL 32.  Dated 01/12/16: A1c 4.7% Dated 08/10/16; BUN 15.5, creatinine 1.1. Other CMET normal. Most recent Hgb 9.7 Dated 02/06/17: normal CMET.  Assessment / Plan: 1. Status post tissue aortic valve replacement for severe aortic stenosis. Clinically doing well. Good valve function by Echo November 2015.  2. Coronary disease status post CABG with an LIMA graft to the  LAD in October of 2011. He is asymptomatic.  3. Dyslipidemia well controlled on Crestor and fish oil.   4. PVCs. These are chronic. Asymptomatic.  5. Atrial fibrillation. Chronic. Asymptomatic. Rate controlled on metoprolol. He has a [place] vasc score of 4 placing him at higher risk of CVA. History of GIB on Xarelto. Now on Eliquis due to interaction of Coumadin with Xeloda. Hgb has been stable.   If he should have recurrent bleeding or anemia that cannot be controlled would need to consider stopping Eliquis and consider him for a LA occlusive device (Watchman).  6. GIB. Related to gastric AVM in the past. More recently dx with cecal CA.   7. Cecal CA. S/p surgery and now on Xeloda.   I will see in 6 months.

## 2017-02-14 ENCOUNTER — Ambulatory Visit (INDEPENDENT_AMBULATORY_CARE_PROVIDER_SITE_OTHER): Payer: Medicare Other | Admitting: Cardiology

## 2017-02-14 ENCOUNTER — Encounter: Payer: Self-pay | Admitting: Cardiology

## 2017-02-14 VITALS — BP 118/70 | HR 82 | Ht 71.0 in | Wt 174.0 lb

## 2017-02-14 DIAGNOSIS — I25810 Atherosclerosis of coronary artery bypass graft(s) without angina pectoris: Secondary | ICD-10-CM

## 2017-02-14 DIAGNOSIS — I482 Chronic atrial fibrillation: Secondary | ICD-10-CM | POA: Diagnosis not present

## 2017-02-14 DIAGNOSIS — C180 Malignant neoplasm of cecum: Secondary | ICD-10-CM | POA: Diagnosis not present

## 2017-02-14 DIAGNOSIS — Z952 Presence of prosthetic heart valve: Secondary | ICD-10-CM | POA: Diagnosis not present

## 2017-02-14 DIAGNOSIS — Z8719 Personal history of other diseases of the digestive system: Secondary | ICD-10-CM | POA: Diagnosis not present

## 2017-02-14 DIAGNOSIS — I4821 Permanent atrial fibrillation: Secondary | ICD-10-CM

## 2017-02-14 NOTE — Patient Instructions (Signed)
Continue your current therapy  I will see you in 6 months.   

## 2017-02-19 DIAGNOSIS — I480 Paroxysmal atrial fibrillation: Secondary | ICD-10-CM | POA: Diagnosis not present

## 2017-02-19 DIAGNOSIS — Z6825 Body mass index (BMI) 25.0-25.9, adult: Secondary | ICD-10-CM | POA: Diagnosis not present

## 2017-02-19 DIAGNOSIS — E538 Deficiency of other specified B group vitamins: Secondary | ICD-10-CM | POA: Diagnosis not present

## 2017-02-19 DIAGNOSIS — F22 Delusional disorders: Secondary | ICD-10-CM | POA: Diagnosis not present

## 2017-02-19 DIAGNOSIS — C180 Malignant neoplasm of cecum: Secondary | ICD-10-CM | POA: Diagnosis not present

## 2017-02-20 ENCOUNTER — Telehealth: Payer: Self-pay | Admitting: *Deleted

## 2017-02-20 DIAGNOSIS — C180 Malignant neoplasm of cecum: Secondary | ICD-10-CM

## 2017-02-20 NOTE — Telephone Encounter (Signed)
Call from pt's wife reporting Dr. Joylene Draft wants B12 level drawn. OK, per Dr. Benay Spice. Unable to order with pt's cancer diagnosis code. Left message on voicemail at Dr. Silvestre Mesi office to fax order for Vitamin B12 level.

## 2017-02-22 ENCOUNTER — Telehealth: Payer: Self-pay | Admitting: *Deleted

## 2017-02-22 NOTE — Telephone Encounter (Signed)
Message from pt's wife requesting return call to discuss a dental problem. Left message for her to call back.

## 2017-02-23 ENCOUNTER — Telehealth: Payer: Self-pay | Admitting: Oncology

## 2017-02-23 NOTE — Telephone Encounter (Signed)
Late entry for 6/28: dentist called Dr. Benay Spice to discuss.

## 2017-02-23 NOTE — Telephone Encounter (Signed)
Spoke with patient about appointment changes and confrimed

## 2017-02-27 ENCOUNTER — Ambulatory Visit (HOSPITAL_BASED_OUTPATIENT_CLINIC_OR_DEPARTMENT_OTHER): Payer: Medicare Other | Admitting: Oncology

## 2017-02-27 ENCOUNTER — Other Ambulatory Visit (HOSPITAL_BASED_OUTPATIENT_CLINIC_OR_DEPARTMENT_OTHER): Payer: Medicare Other

## 2017-02-27 ENCOUNTER — Telehealth: Payer: Self-pay | Admitting: Oncology

## 2017-02-27 ENCOUNTER — Encounter: Payer: Self-pay | Admitting: Lab

## 2017-02-27 VITALS — BP 126/90 | HR 84 | Temp 97.5°F | Resp 20 | Ht 71.0 in | Wt 174.5 lb

## 2017-02-27 DIAGNOSIS — I25810 Atherosclerosis of coronary artery bypass graft(s) without angina pectoris: Secondary | ICD-10-CM

## 2017-02-27 DIAGNOSIS — D519 Vitamin B12 deficiency anemia, unspecified: Secondary | ICD-10-CM | POA: Diagnosis not present

## 2017-02-27 DIAGNOSIS — Z87898 Personal history of other specified conditions: Secondary | ICD-10-CM | POA: Insufficient documentation

## 2017-02-27 DIAGNOSIS — C180 Malignant neoplasm of cecum: Secondary | ICD-10-CM | POA: Diagnosis not present

## 2017-02-27 LAB — CBC WITH DIFFERENTIAL/PLATELET
BASO%: 0.9 % (ref 0.0–2.0)
BASOS ABS: 0 10*3/uL (ref 0.0–0.1)
EOS ABS: 0.2 10*3/uL (ref 0.0–0.5)
EOS%: 3.6 % (ref 0.0–7.0)
HCT: 41 % (ref 38.4–49.9)
HGB: 13.6 g/dL (ref 13.0–17.1)
LYMPH%: 19.1 % (ref 14.0–49.0)
MCH: 33.7 pg — AB (ref 27.2–33.4)
MCHC: 33.2 g/dL (ref 32.0–36.0)
MCV: 101.5 fL — AB (ref 79.3–98.0)
MONO#: 0.5 10*3/uL (ref 0.1–0.9)
MONO%: 9.6 % (ref 0.0–14.0)
NEUT#: 3.3 10*3/uL (ref 1.5–6.5)
NEUT%: 66.8 % (ref 39.0–75.0)
Platelets: 152 10*3/uL (ref 140–400)
RBC: 4.04 10*6/uL — AB (ref 4.20–5.82)
RDW: 24.5 % — ABNORMAL HIGH (ref 11.0–14.6)
WBC: 5 10*3/uL (ref 4.0–10.3)
lymph#: 0.9 10*3/uL (ref 0.9–3.3)

## 2017-02-27 LAB — COMPREHENSIVE METABOLIC PANEL
ALT: 21 U/L (ref 0–55)
ANION GAP: 11 meq/L (ref 3–11)
AST: 23 U/L (ref 5–34)
Albumin: 4 g/dL (ref 3.5–5.0)
Alkaline Phosphatase: 54 U/L (ref 40–150)
BUN: 14.4 mg/dL (ref 7.0–26.0)
CALCIUM: 9.7 mg/dL (ref 8.4–10.4)
CHLORIDE: 104 meq/L (ref 98–109)
CO2: 27 meq/L (ref 22–29)
Creatinine: 1.1 mg/dL (ref 0.7–1.3)
EGFR: 59 mL/min/{1.73_m2} — AB (ref >90)
Glucose: 133 mg/dl (ref 70–140)
POTASSIUM: 4.2 meq/L (ref 3.5–5.1)
Sodium: 142 mEq/L (ref 136–145)
Total Bilirubin: 0.65 mg/dL (ref 0.20–1.20)
Total Protein: 7.6 g/dL (ref 6.4–8.3)

## 2017-02-27 NOTE — Telephone Encounter (Signed)
Scheduled appt per 7/3 los - Gave patient AVS and calender per los.  

## 2017-02-27 NOTE — Progress Notes (Signed)
  [place] OFFICE PROGRESS NOTE   Diagnosis: Colon cancer  INTERVAL HISTORY:   [name] for returns as scheduled. He completed another cycle of adjuvant Xeloda beginning 02/13/2017. He reports mouth soreness without discrete ulcers. He had a root canal last week. He has up to 2 loose stools per day. He is not taking Imodium. No hand or foot pain. He completed the most recent cycle of Xeloda yesterday.  Objective:  Vital signs in last 24 hours:  Blood pressure 126/90, pulse 84, temperature 97.5 F (36.4 C), temperature source Oral, resp. rate 20, height '5\' 11"'$  (1.803 m), weight 174 lb 8 oz (79.2 kg), SpO2 97 %.    HEENT: No thrush or ulcers Resp: Lungs clear bilaterally Cardio: Irregular GI: No hepatomegaly, nontender Vascular: No leg edema  Skin: Skin thickening at the palms without breakdown. Dryness of the soles.    Lab Results:  Lab Results  Component Value Date   WBC 5.0 02/27/2017   HGB 13.6 02/27/2017   HCT 41.0 02/27/2017   MCV 101.5 (H) 02/27/2017   PLT 152 02/27/2017   NEUTROABS 3.3 02/27/2017    CMP     Component Value Date/Time   NA 138 02/06/2017 0934   K 4.4 02/06/2017 0934   CL 104 11/01/2016 0548   CO2 28 02/06/2017 0934   GLUCOSE 111 02/06/2017 0934   BUN 16.0 02/06/2017 0934   CREATININE 1.1 02/06/2017 0934   CALCIUM 9.9 02/06/2017 0934   PROT 7.9 02/06/2017 0934   ALBUMIN 4.2 02/06/2017 0934   AST 42 (H) 02/06/2017 0934   ALT 26 02/06/2017 0934   ALKPHOS 54 02/06/2017 0934   BILITOT 0.73 02/06/2017 0934   GFRNONAA >60 11/01/2016 0548   GFRAA >60 11/01/2016 0548   Medications: I have reviewed the patient's current medications.  Assessment/Plan: 1. Colon cancer, cecum, stage IIIB (T4a,N1b), status post a right colectomy 10/24/2016  3/23 lymph nodes positive for metastatic adenocarcinoma  MSI-stable, no loss of mismatch repair protein expression  Cycle 1 adjuvant Xeloda 11/20/2016  Cycle 2 adjuvant Xeloda  12/12/2016 (dose reduced due to mucositis following cycle 1)  Cycle 3 adjuvant Xeloda 01/02/2017  Cycle 4 adjuvant Xeloda 01/23/2017  Cycle 5 adjuvant Xeloda 02/13/2017  Cycle 6 adjuvant Xeloda 03/06/2017  2. History of iron deficiency anemia secondary to #1, status post IV iron therapy and multiple red cell transfusions; hemoglobin improved 11/16/2016;hemoglobin normal range 01/17/2017  3. Prostate cancer followed by Dr. Riesa Pope observation  4. History of coronary artery disease, status post coronary artery bypass surgery 2011  5. Aortic valve replacement 2011  6. Atrial fibrillation-previously maintained on Coumadin;switched to Eliquisdue to known interaction between Coumadin and Xeloda  7. Glaucoma  8. Mucositis secondary to Xeloda following cycle 1; Xeloda dose reduced beginning with cycle 2.    Disposition:  Jason [name] appears stable. He is tolerating the Xeloda well at the current dose. He will begin cycle 7 adjuvant Xeloda on 03/06/2017. He will return for an office visit on 03/21/2017.  He will contact us in the interim for increased mouth soreness or diarrhea.  15 minutes were spent with the patient today. The majority of the time was used for counseling and coordination of care.  Donneta Romberg, MD  02/27/2017  8:38 AM

## 2017-03-01 ENCOUNTER — Ambulatory Visit: Payer: Medicare Other | Admitting: Oncology

## 2017-03-01 ENCOUNTER — Other Ambulatory Visit: Payer: Self-pay | Admitting: Oncology

## 2017-03-01 ENCOUNTER — Other Ambulatory Visit: Payer: Medicare Other

## 2017-03-01 DIAGNOSIS — C180 Malignant neoplasm of cecum: Secondary | ICD-10-CM

## 2017-03-05 ENCOUNTER — Other Ambulatory Visit: Payer: Self-pay | Admitting: Oncology

## 2017-03-05 DIAGNOSIS — C180 Malignant neoplasm of cecum: Secondary | ICD-10-CM

## 2017-03-05 MED FILL — XELODA 500 MG TABLET: 500 | 14 days supply | Qty: 56 | Fill #0

## 2017-03-20 DIAGNOSIS — H35363 Drusen (degenerative) of macula, bilateral: Secondary | ICD-10-CM | POA: Diagnosis not present

## 2017-03-20 DIAGNOSIS — H35361 Drusen (degenerative) of macula, right eye: Secondary | ICD-10-CM | POA: Diagnosis not present

## 2017-03-20 DIAGNOSIS — H401132 Primary open-angle glaucoma, bilateral, moderate stage: Secondary | ICD-10-CM | POA: Diagnosis not present

## 2017-03-20 DIAGNOSIS — H353134 Nonexudative age-related macular degeneration, bilateral, advanced atrophic with subfoveal involvement: Secondary | ICD-10-CM | POA: Diagnosis not present

## 2017-03-21 ENCOUNTER — Ambulatory Visit (HOSPITAL_BASED_OUTPATIENT_CLINIC_OR_DEPARTMENT_OTHER): Payer: Medicare Other | Admitting: Nurse Practitioner

## 2017-03-21 ENCOUNTER — Telehealth: Payer: Self-pay | Admitting: Oncology

## 2017-03-21 ENCOUNTER — Other Ambulatory Visit (HOSPITAL_BASED_OUTPATIENT_CLINIC_OR_DEPARTMENT_OTHER): Payer: Medicare Other

## 2017-03-21 VITALS — BP 123/67 | HR 72 | Temp 98.4°F | Resp 17 | Ht 71.0 in | Wt 176.7 lb

## 2017-03-21 DIAGNOSIS — C180 Malignant neoplasm of cecum: Secondary | ICD-10-CM | POA: Diagnosis not present

## 2017-03-21 DIAGNOSIS — C61 Malignant neoplasm of prostate: Secondary | ICD-10-CM | POA: Diagnosis not present

## 2017-03-21 DIAGNOSIS — I25810 Atherosclerosis of coronary artery bypass graft(s) without angina pectoris: Secondary | ICD-10-CM

## 2017-03-21 LAB — CBC WITH DIFFERENTIAL/PLATELET
BASO%: 1.2 % (ref 0.0–2.0)
Basophils Absolute: 0.1 10*3/uL (ref 0.0–0.1)
EOS%: 2.6 % (ref 0.0–7.0)
Eosinophils Absolute: 0.2 10*3/uL (ref 0.0–0.5)
HEMATOCRIT: 38.4 % (ref 38.4–49.9)
HEMOGLOBIN: 12.8 g/dL — AB (ref 13.0–17.1)
LYMPH#: 1.4 10*3/uL (ref 0.9–3.3)
LYMPH%: 23.4 % (ref 14.0–49.0)
MCH: 35 pg — ABNORMAL HIGH (ref 27.2–33.4)
MCHC: 33.3 g/dL (ref 32.0–36.0)
MCV: 104.9 fL — ABNORMAL HIGH (ref 79.3–98.0)
MONO#: 0.6 10*3/uL (ref 0.1–0.9)
MONO%: 11.1 % (ref 0.0–14.0)
NEUT#: 3.6 10*3/uL (ref 1.5–6.5)
NEUT%: 61.7 % (ref 39.0–75.0)
Platelets: 130 10*3/uL — ABNORMAL LOW (ref 140–400)
RBC: 3.66 10*6/uL — ABNORMAL LOW (ref 4.20–5.82)
RDW: 18.7 % — ABNORMAL HIGH (ref 11.0–14.6)
WBC: 5.8 10*3/uL (ref 4.0–10.3)

## 2017-03-21 MED ORDER — CAPECITABINE 500 MG PO TABS: ORAL_TABLET | ORAL | 0 refills | Status: DC

## 2017-03-21 NOTE — Progress Notes (Signed)
  [place] OFFICE PROGRESS NOTE   Diagnosis:  Colon cancer  INTERVAL HISTORY:   Mr. Morad returns as scheduled. He completed cycle 6 adjuvant Xeloda beginning 03/06/2017. He denies nausea/vomiting. He notes mild mouth soreness. No ulcers. He is able to eat and drink without difficulty. No hand or foot pain or redness.  Objective:  Vital signs in last 24 hours:  Blood pressure 123/67, pulse 72, temperature 98.4 F (36.9 C), temperature source Oral, resp. rate 17, height '5\' 11"'$  (1.803 m), weight 176 lb 11.2 oz (80.2 kg), SpO2 99 %.    HEENT: Oral mucosa is without erythema. No thrush or ulcers. Resp: Lungs clear bilaterally. Cardio: Regular rate and rhythm. GI: Abdomen soft and nontender. No hepatomegaly. Vascular: No leg edema.  Skin: Palms and soles without erythema. Fingertips with mild skin thickening.    Lab Results:  Lab Results  Component Value Date   WBC 5.8 03/21/2017   HGB 12.8 (L) 03/21/2017   HCT 38.4 03/21/2017   MCV 104.9 (H) 03/21/2017   PLT 130 (L) 03/21/2017   NEUTROABS 3.6 03/21/2017    Imaging:  No results found.  Medications: I have reviewed the patient's current medications.  Assessment/Plan: 1. Colon cancer, cecum, stage IIIB (T4a,N1b), status post a right colectomy 10/24/2016  3/23 lymph nodes positive for metastatic adenocarcinoma  MSI-stable, no loss of mismatch repair protein expression  Cycle 1 adjuvant Xeloda 11/20/2016  Cycle 2 adjuvant Xeloda 12/12/2016 (dose reduced due to mucositis following cycle 1)  Cycle 3 adjuvant Xeloda 01/02/2017  Cycle 4 adjuvant Xeloda 01/23/2017  Cycle 5 adjuvant Xeloda 02/13/2017  Cycle 6 adjuvant Xeloda 03/06/2017  Cycle 7 adjuvant Xeloda 03/27/2017  2. History of iron deficiency anemia secondary to #1, status post IV iron therapy and multiple red cell transfusions; hemoglobin improved 11/16/2016;hemoglobin normal range 01/17/2017  3. Prostate cancer followed by Dr.  Riesa Pope observation  4. History of coronary artery disease, status post coronary artery bypass surgery 2011  5. Aortic valve replacement 2011  6. Atrial fibrillation-previously maintained on Coumadin;switched to Eliquisdue to known interaction between Coumadin and Xeloda  7. Glaucoma  8. Mucositis secondary to Xeloda following cycle 1; Xeloda dose reduced beginning with cycle 2.   Disposition: Mr. Bendorf appears stable. He has completed 6 cycles of adjuvant Xeloda. He will begin cycle 7 on 03/27/2017. He will return for a follow-up visit in 3 weeks. He will contact the office in the interim with any problems.    Ned Card ANP/GNP-BC   03/21/2017  11:00 AM

## 2017-03-21 NOTE — Telephone Encounter (Signed)
Gave patient avs report and appointments for August.  °

## 2017-03-22 MED FILL — XELODA 500 MG TABLET: 500 | 21 days supply | Qty: 56 | Fill #0

## 2017-04-09 DIAGNOSIS — C61 Malignant neoplasm of prostate: Secondary | ICD-10-CM | POA: Diagnosis not present

## 2017-04-11 ENCOUNTER — Other Ambulatory Visit: Payer: Self-pay

## 2017-04-11 ENCOUNTER — Telehealth: Payer: Self-pay

## 2017-04-11 ENCOUNTER — Ambulatory Visit (HOSPITAL_BASED_OUTPATIENT_CLINIC_OR_DEPARTMENT_OTHER): Payer: Medicare Other | Admitting: Oncology

## 2017-04-11 ENCOUNTER — Other Ambulatory Visit (HOSPITAL_BASED_OUTPATIENT_CLINIC_OR_DEPARTMENT_OTHER): Payer: Medicare Other

## 2017-04-11 VITALS — BP 112/67 | HR 80 | Temp 98.6°F | Resp 18 | Ht 71.0 in | Wt 175.7 lb

## 2017-04-11 DIAGNOSIS — C61 Malignant neoplasm of prostate: Secondary | ICD-10-CM

## 2017-04-11 DIAGNOSIS — C180 Malignant neoplasm of cecum: Secondary | ICD-10-CM

## 2017-04-11 DIAGNOSIS — I25810 Atherosclerosis of coronary artery bypass graft(s) without angina pectoris: Secondary | ICD-10-CM

## 2017-04-11 LAB — CBC WITH DIFFERENTIAL/PLATELET
BASO%: 1.4 % (ref 0.0–2.0)
BASOS ABS: 0.1 10*3/uL (ref 0.0–0.1)
EOS ABS: 0.2 10*3/uL (ref 0.0–0.5)
EOS%: 3.6 % (ref 0.0–7.0)
HEMATOCRIT: 38.9 % (ref 38.4–49.9)
HEMOGLOBIN: 13 g/dL (ref 13.0–17.1)
LYMPH#: 1.2 10*3/uL (ref 0.9–3.3)
LYMPH%: 23.6 % (ref 14.0–49.0)
MCH: 36.1 pg — AB (ref 27.2–33.4)
MCHC: 33.4 g/dL (ref 32.0–36.0)
MCV: 108.1 fL — AB (ref 79.3–98.0)
MONO#: 0.6 10*3/uL (ref 0.1–0.9)
MONO%: 12.9 % (ref 0.0–14.0)
NEUT%: 58.5 % (ref 39.0–75.0)
NEUTROS ABS: 2.9 10*3/uL (ref 1.5–6.5)
PLATELETS: 140 10*3/uL (ref 140–400)
RBC: 3.6 10*6/uL — ABNORMAL LOW (ref 4.20–5.82)
RDW: 17.1 % — AB (ref 11.0–14.6)
WBC: 5 10*3/uL (ref 4.0–10.3)

## 2017-04-11 LAB — COMPREHENSIVE METABOLIC PANEL
ALBUMIN: 3.9 g/dL (ref 3.5–5.0)
ALK PHOS: 54 U/L (ref 40–150)
ALT: 19 U/L (ref 0–55)
AST: 22 U/L (ref 5–34)
Anion Gap: 8 mEq/L (ref 3–11)
BUN: 13.7 mg/dL (ref 7.0–26.0)
CO2: 27 mEq/L (ref 22–29)
Calcium: 9.4 mg/dL (ref 8.4–10.4)
Chloride: 104 mEq/L (ref 98–109)
Creatinine: 1 mg/dL (ref 0.7–1.3)
EGFR: 66 mL/min/{1.73_m2} — AB (ref >90)
GLUCOSE: 97 mg/dL (ref 70–140)
POTASSIUM: 3.8 meq/L (ref 3.5–5.1)
SODIUM: 139 meq/L (ref 136–145)
Total Bilirubin: 0.91 mg/dL (ref 0.20–1.20)
Total Protein: 7.5 g/dL (ref 6.4–8.3)

## 2017-04-11 MED ORDER — CAPECITABINE 500 MG PO TABS: ORAL_TABLET | ORAL | 0 refills | Status: DC

## 2017-04-11 MED FILL — XELODA 500 MG TABLET: 500 | 21 days supply | Qty: 56 | Fill #0

## 2017-04-11 NOTE — Progress Notes (Signed)
  [place] OFFICE PROGRESS NOTE   Diagnosis: Colon cancer  INTERVAL HISTORY:   Mr. Schippers returns as scheduled. He began another cycle of adjuvant Xeloda 03/27/2017. He had an episode of diarrhea on 04/09/2017. He took Imodium and has not had further diarrhea. Dryness of the hands. No mouth sores. No nausea.  Objective:  Vital signs in last 24 hours:  Blood pressure 112/67, pulse 80, temperature 98.6 F (37 C), temperature source Oral, resp. rate 18, height _0  (1.803 m), weight 175 lb 11.2 oz (79.7 kg), SpO2 99 %.    HEENT: No thrush or ulcers Resp: Lungs clear bilaterally Cardio: Irregular GI: No hepatosplenomegaly, nontender Vascular: No leg edema  Skin: Dryness of the hands, mild erythema of the soles with dry desquamation at the heels.   [place]  Lab Results:  Lab Results  Component Value Date   WBC 5.0 04/11/2017   HGB 13.0 04/11/2017   HCT 38.9 04/11/2017   MCV 108.1 (H) 04/11/2017   PLT 140 04/11/2017   NEUTROABS 2.9 04/11/2017    CMP     Component Value Date/Time   NA 139 04/11/2017 0958   K 3.8 04/11/2017 0958   CL 104 11/01/2016 0548   CO2 27 04/11/2017 0958   GLUCOSE 97 04/11/2017 0958   BUN 13.7 04/11/2017 0958   CREATININE 1.0 04/11/2017 0958   CALCIUM 9.4 04/11/2017 0958   PROT 7.5 04/11/2017 0958   ALBUMIN 3.9 04/11/2017 0958   AST 22 04/11/2017 0958   ALT 19 04/11/2017 0958   ALKPHOS 54 04/11/2017 0958   BILITOT 0.91 04/11/2017 0958   GFRNONAA >60 11/01/2016 0548   GFRAA >60 11/01/2016 0548     Medications: I have reviewed the patient's current medications.  Assessment/Plan: 1. Colon cancer, cecum, stage IIIB (T4a,N1b), status post a right colectomy 10/24/2016  3/23 lymph nodes positive for metastatic adenocarcinoma  MSI-stable, no loss of mismatch repair protein expression  Cycle 1 adjuvant Xeloda 11/20/2016  Cycle 2 adjuvant Xeloda 12/12/2016 (dose reduced due to mucositis following cycle 1)  Cycle 3  adjuvant Xeloda 01/02/2017  Cycle 4 adjuvant Xeloda 01/23/2017  Cycle 5 adjuvant Xeloda 02/13/2017  Cycle 6 adjuvant Xeloda 03/06/2017  Cycle 7 adjuvant Xeloda 03/27/2017   Cycle 8 adjuvant Xeloda 04/17/2017  2. History of iron deficiency anemia secondary to #1, status post IV iron therapy and multiple red cell transfusions; hemoglobin improved 11/16/2016;hemoglobin normal range 01/17/2017  3. Prostate cancer followed by Dr. Riesa Pope observation  4. History of coronary artery disease, status post coronary artery bypass surgery 2011  5. Aortic valve replacement 2011  6. Atrial fibrillation-previously maintained on Coumadin;switched to Eliquisdue to known interaction between Coumadin and Xeloda  7. Glaucoma  8. Mucositis secondary to Xeloda following cycle 1; Xeloda dose reduced beginning with cycle 2.   Disposition:  Jason [name] continues to tolerate the Xeloda well. He will begin the final cycle of adjuvant Xeloda on a 21 2018. He will contact us for increased diarrhea.  He will return for an office visit and CEA in one month.  15 minutes were spent with the patient today. The majority of the time was used for counseling and coordination of care.  Donneta Romberg, MD  04/11/2017  11:21 AM

## 2017-04-11 NOTE — Telephone Encounter (Signed)
appts made and avs printed for patient 

## 2017-04-16 DIAGNOSIS — R351 Nocturia: Secondary | ICD-10-CM | POA: Diagnosis not present

## 2017-04-16 DIAGNOSIS — N401 Enlarged prostate with lower urinary tract symptoms: Secondary | ICD-10-CM | POA: Diagnosis not present

## 2017-04-16 DIAGNOSIS — C61 Malignant neoplasm of prostate: Secondary | ICD-10-CM | POA: Diagnosis not present

## 2017-04-23 DIAGNOSIS — H401111 Primary open-angle glaucoma, right eye, mild stage: Secondary | ICD-10-CM | POA: Diagnosis not present

## 2017-04-23 DIAGNOSIS — H3530 Unspecified macular degeneration: Secondary | ICD-10-CM | POA: Diagnosis not present

## 2017-04-23 DIAGNOSIS — H401123 Primary open-angle glaucoma, left eye, severe stage: Secondary | ICD-10-CM | POA: Diagnosis not present

## 2017-05-09 ENCOUNTER — Telehealth: Payer: Self-pay | Admitting: *Deleted

## 2017-05-09 ENCOUNTER — Other Ambulatory Visit (HOSPITAL_BASED_OUTPATIENT_CLINIC_OR_DEPARTMENT_OTHER): Payer: Medicare Other

## 2017-05-09 ENCOUNTER — Telehealth: Payer: Self-pay | Admitting: Nurse Practitioner

## 2017-05-09 ENCOUNTER — Other Ambulatory Visit: Payer: Self-pay | Admitting: Nurse Practitioner

## 2017-05-09 ENCOUNTER — Ambulatory Visit (HOSPITAL_BASED_OUTPATIENT_CLINIC_OR_DEPARTMENT_OTHER): Payer: Medicare Other | Admitting: Nurse Practitioner

## 2017-05-09 VITALS — BP 120/72 | HR 84 | Temp 98.6°F | Resp 20 | Ht 71.0 in | Wt 177.8 lb

## 2017-05-09 DIAGNOSIS — C180 Malignant neoplasm of cecum: Secondary | ICD-10-CM

## 2017-05-09 DIAGNOSIS — I25810 Atherosclerosis of coronary artery bypass graft(s) without angina pectoris: Secondary | ICD-10-CM

## 2017-05-09 DIAGNOSIS — C61 Malignant neoplasm of prostate: Secondary | ICD-10-CM

## 2017-05-09 DIAGNOSIS — D509 Iron deficiency anemia, unspecified: Secondary | ICD-10-CM

## 2017-05-09 LAB — CBC WITH DIFFERENTIAL/PLATELET
BASO%: 0.2 % (ref 0.0–2.0)
BASOS ABS: 0 10*3/uL (ref 0.0–0.1)
EOS%: 5.6 % (ref 0.0–7.0)
Eosinophils Absolute: 0.3 10*3/uL (ref 0.0–0.5)
HEMATOCRIT: 40.7 % (ref 38.4–49.9)
HEMOGLOBIN: 13.7 g/dL (ref 13.0–17.1)
LYMPH#: 1.2 10*3/uL (ref 0.9–3.3)
LYMPH%: 24.9 % (ref 14.0–49.0)
MCH: 36.8 pg — ABNORMAL HIGH (ref 27.2–33.4)
MCHC: 33.7 g/dL (ref 32.0–36.0)
MCV: 109.4 fL — ABNORMAL HIGH (ref 79.3–98.0)
MONO#: 0.6 10*3/uL (ref 0.1–0.9)
MONO%: 11.9 % (ref 0.0–14.0)
NEUT%: 57.4 % (ref 39.0–75.0)
NEUTROS ABS: 2.8 10*3/uL (ref 1.5–6.5)
Platelets: 143 10*3/uL (ref 140–400)
RBC: 3.72 10*6/uL — ABNORMAL LOW (ref 4.20–5.82)
RDW: 18.4 % — AB (ref 11.0–14.6)
WBC: 5 10*3/uL (ref 4.0–10.3)

## 2017-05-09 LAB — CEA (IN HOUSE-CHCC): CEA (CHCC-In House): 5.05 ng/mL — ABNORMAL HIGH (ref 0.00–5.00)

## 2017-05-09 NOTE — Telephone Encounter (Signed)
LM for patient to return call to advise lab results as directed below

## 2017-05-09 NOTE — Telephone Encounter (Signed)
Scheduled appt per 9/12 los - Gave patient AVS and calender per los.  

## 2017-05-09 NOTE — Progress Notes (Signed)
  [place] OFFICE PROGRESS NOTE   Diagnosis:  Colon cancer  INTERVAL HISTORY:   Jason [name] returns as scheduled. He completed the eighth and final cycle of adjuvant Xeloda beginning 04/17/2017. He denies nausea/vomiting. No mouth sores. No diarrhea. He notes some "rough" patches on his hands and feet. No associated pain.  Objective:  Vital signs in last 24 hours:  Blood pressure 120/72, pulse 84, temperature 98.6 F (37 C), temperature source Oral, resp. rate 20, height '5\' 11"'$  (1.803 m), weight 177 lb 12.8 oz (80.6 kg), SpO2 98 %.    HEENT: No thrush or ulcers. Lymphatics: No palpable cervical, supra clavicular, axillary or inguinal lymph nodes. Resp: Lungs clear bilaterally. Cardio: Regular rate and rhythm. GI: Abdomen soft and nontender. No hepatomegaly. Vascular: No leg edema. Skin: Erythematous lesion at the mid back.    Lab Results:  Lab Results  Component Value Date   WBC 5.0 05/09/2017   HGB 13.7 05/09/2017   HCT 40.7 05/09/2017   MCV 109.4 (H) 05/09/2017   PLT 143 05/09/2017   NEUTROABS 2.8 05/09/2017    Imaging:  No results found.  Medications: I have reviewed the patient's current medications.  Assessment/Plan: 1. Colon cancer, cecum, stage IIIB (T4a,N1b), status post a right colectomy 10/24/2016  3/23 lymph nodes positive for metastatic adenocarcinoma  MSI-stable, no loss of mismatch repair protein expression  Cycle 1 adjuvant Xeloda 11/20/2016  Cycle 2 adjuvant Xeloda 12/12/2016 (dose reduced due to mucositis following cycle 1)  Cycle 3 adjuvant Xeloda 01/02/2017  Cycle 4 adjuvant Xeloda 01/23/2017  Cycle 5 adjuvant Xeloda 02/13/2017  Cycle 6 adjuvant Xeloda 03/06/2017  Cycle 7 adjuvant Xeloda 03/27/2017   Cycle 8 adjuvant Xeloda 04/17/2017  2. History of iron deficiency anemia secondary to #1, status post IV iron therapy and multiple red cell transfusions; hemoglobin improved 11/16/2016;hemoglobin normal range  01/17/2017  3. Prostate cancer followed by Dr. Riesa Pope observation  4. History of coronary artery disease, status post coronary artery bypass surgery 2011  5. Aortic valve replacement 2011  6. Atrial fibrillation-previously maintained on Coumadin;switched to Eliquisdue to known interaction between Coumadin and Xeloda  7. Glaucoma  8. Mucositis secondary to Xeloda following cycle 1; Xeloda dose reduced beginning with cycle 2.   Disposition: Jason [name] appears stable. He has completed the planned course of adjuvant chemotherapy. We will follow-up on the CEA from today. He will return for a follow-up visit and CEA in 6 months. He will contact the office in the interim with any problems.  The erythematous lesion at the mid back appears consistent with a resolving inflamed sebaceous cyst. He understands to seek evaluation with increasing redness.  Patient seen with Dr. Benay Spice.  Ned Card ANP/GNP-BC   05/09/2017  11:08 AM This was a shared visit with Ned Card. Jason [name] was interviewed and examined. He has completed the course of adjuvant chemotherapy.  He will see Dr. Joylene Draft to discuss resuming Coumadin versus continuing Eliquis.  Julieanne Manson, M.D.

## 2017-05-09 NOTE — Telephone Encounter (Signed)
-----   Message from Owens Shark, NP sent at 05/09/2017  1:39 PM EDT ----- Please let patient know the CEA is mildly elevated. Dr. Benay Spice recommends to repeat in 3 months.

## 2017-05-14 ENCOUNTER — Telehealth: Payer: Self-pay | Admitting: Oncology

## 2017-05-14 ENCOUNTER — Encounter: Payer: Self-pay | Admitting: Pharmacist

## 2017-05-14 NOTE — Telephone Encounter (Signed)
Spoke with patient's wife regarding the appts that were scheduled per 9/13 sch msg.

## 2017-05-15 ENCOUNTER — Telehealth: Payer: Self-pay | Admitting: Oncology

## 2017-05-15 NOTE — Telephone Encounter (Signed)
Attempted to leave message for patient regarding the changes in his schedule per 9/2 sch msg. Sending a confirmation lette.r

## 2017-05-26 DIAGNOSIS — Z23 Encounter for immunization: Secondary | ICD-10-CM | POA: Diagnosis not present

## 2017-06-09 DIAGNOSIS — H02833 Dermatochalasis of right eye, unspecified eyelid: Secondary | ICD-10-CM | POA: Diagnosis not present

## 2017-06-09 DIAGNOSIS — I10 Essential (primary) hypertension: Secondary | ICD-10-CM | POA: Diagnosis not present

## 2017-06-09 DIAGNOSIS — Z9841 Cataract extraction status, right eye: Secondary | ICD-10-CM | POA: Diagnosis not present

## 2017-06-09 DIAGNOSIS — Z9842 Cataract extraction status, left eye: Secondary | ICD-10-CM | POA: Diagnosis not present

## 2017-06-09 DIAGNOSIS — H3562 Retinal hemorrhage, left eye: Secondary | ICD-10-CM | POA: Diagnosis not present

## 2017-06-09 DIAGNOSIS — H02836 Dermatochalasis of left eye, unspecified eyelid: Secondary | ICD-10-CM | POA: Diagnosis not present

## 2017-06-09 DIAGNOSIS — Z7901 Long term (current) use of anticoagulants: Secondary | ICD-10-CM | POA: Diagnosis not present

## 2017-06-09 DIAGNOSIS — Z87891 Personal history of nicotine dependence: Secondary | ICD-10-CM | POA: Diagnosis not present

## 2017-06-09 DIAGNOSIS — Z961 Presence of intraocular lens: Secondary | ICD-10-CM | POA: Diagnosis not present

## 2017-06-09 DIAGNOSIS — I4891 Unspecified atrial fibrillation: Secondary | ICD-10-CM | POA: Diagnosis not present

## 2017-06-09 DIAGNOSIS — H5789 Other specified disorders of eye and adnexa: Secondary | ICD-10-CM | POA: Diagnosis not present

## 2017-06-09 DIAGNOSIS — Z947 Corneal transplant status: Secondary | ICD-10-CM | POA: Diagnosis not present

## 2017-06-11 DIAGNOSIS — H401132 Primary open-angle glaucoma, bilateral, moderate stage: Secondary | ICD-10-CM | POA: Diagnosis not present

## 2017-06-11 DIAGNOSIS — H353134 Nonexudative age-related macular degeneration, bilateral, advanced atrophic with subfoveal involvement: Secondary | ICD-10-CM | POA: Diagnosis not present

## 2017-06-11 DIAGNOSIS — H3562 Retinal hemorrhage, left eye: Secondary | ICD-10-CM | POA: Diagnosis not present

## 2017-06-11 DIAGNOSIS — H353221 Exudative age-related macular degeneration, left eye, with active choroidal neovascularization: Secondary | ICD-10-CM | POA: Diagnosis not present

## 2017-06-12 DIAGNOSIS — H353221 Exudative age-related macular degeneration, left eye, with active choroidal neovascularization: Secondary | ICD-10-CM | POA: Diagnosis not present

## 2017-06-12 DIAGNOSIS — H3562 Retinal hemorrhage, left eye: Secondary | ICD-10-CM | POA: Diagnosis not present

## 2017-06-12 DIAGNOSIS — H4032X4 Glaucoma secondary to eye trauma, left eye, indeterminate stage: Secondary | ICD-10-CM | POA: Diagnosis not present

## 2017-06-14 DIAGNOSIS — L03032 Cellulitis of left toe: Secondary | ICD-10-CM | POA: Diagnosis not present

## 2017-06-18 DIAGNOSIS — H35363 Drusen (degenerative) of macula, bilateral: Secondary | ICD-10-CM | POA: Diagnosis not present

## 2017-06-18 DIAGNOSIS — H353134 Nonexudative age-related macular degeneration, bilateral, advanced atrophic with subfoveal involvement: Secondary | ICD-10-CM | POA: Diagnosis not present

## 2017-06-18 DIAGNOSIS — H35361 Drusen (degenerative) of macula, right eye: Secondary | ICD-10-CM | POA: Diagnosis not present

## 2017-06-18 DIAGNOSIS — H3562 Retinal hemorrhage, left eye: Secondary | ICD-10-CM | POA: Diagnosis not present

## 2017-06-18 DIAGNOSIS — H353221 Exudative age-related macular degeneration, left eye, with active choroidal neovascularization: Secondary | ICD-10-CM | POA: Diagnosis not present

## 2017-06-21 DIAGNOSIS — H401111 Primary open-angle glaucoma, right eye, mild stage: Secondary | ICD-10-CM | POA: Diagnosis not present

## 2017-06-21 DIAGNOSIS — H401123 Primary open-angle glaucoma, left eye, severe stage: Secondary | ICD-10-CM | POA: Diagnosis not present

## 2017-06-26 MED FILL — SHINGRIX VIAL KIT: 50 | 1 days supply | Qty: 1 | Fill #0

## 2017-07-02 DIAGNOSIS — H4032X4 Glaucoma secondary to eye trauma, left eye, indeterminate stage: Secondary | ICD-10-CM | POA: Diagnosis not present

## 2017-07-02 DIAGNOSIS — H353221 Exudative age-related macular degeneration, left eye, with active choroidal neovascularization: Secondary | ICD-10-CM | POA: Diagnosis not present

## 2017-07-02 DIAGNOSIS — H3562 Retinal hemorrhage, left eye: Secondary | ICD-10-CM | POA: Diagnosis not present

## 2017-07-02 DIAGNOSIS — H353134 Nonexudative age-related macular degeneration, bilateral, advanced atrophic with subfoveal involvement: Secondary | ICD-10-CM | POA: Diagnosis not present

## 2017-07-16 DIAGNOSIS — H401123 Primary open-angle glaucoma, left eye, severe stage: Secondary | ICD-10-CM | POA: Diagnosis not present

## 2017-07-16 DIAGNOSIS — H401111 Primary open-angle glaucoma, right eye, mild stage: Secondary | ICD-10-CM | POA: Diagnosis not present

## 2017-08-08 ENCOUNTER — Ambulatory Visit: Payer: Medicare Other | Admitting: Cardiology

## 2017-08-08 DIAGNOSIS — H353212 Exudative age-related macular degeneration, right eye, with inactive choroidal neovascularization: Secondary | ICD-10-CM | POA: Diagnosis not present

## 2017-08-08 DIAGNOSIS — H4720 Unspecified optic atrophy: Secondary | ICD-10-CM | POA: Diagnosis not present

## 2017-08-08 DIAGNOSIS — H353221 Exudative age-related macular degeneration, left eye, with active choroidal neovascularization: Secondary | ICD-10-CM | POA: Diagnosis not present

## 2017-08-08 DIAGNOSIS — H3562 Retinal hemorrhage, left eye: Secondary | ICD-10-CM | POA: Diagnosis not present

## 2017-08-08 DIAGNOSIS — H353134 Nonexudative age-related macular degeneration, bilateral, advanced atrophic with subfoveal involvement: Secondary | ICD-10-CM | POA: Diagnosis not present

## 2017-08-08 LAB — HM DIABETES EYE EXAM

## 2017-08-14 ENCOUNTER — Telehealth: Payer: Self-pay | Admitting: Cardiology

## 2017-08-14 NOTE — Telephone Encounter (Signed)
Returned call to [employer]), she states that back in October when we were experiencing results from [facility], she states that pt stopped Eliquis for a dental procedure 2 days before this procedure, pt had a bleed in his Retina and has lost vision to this day. She states that they are hoping to have the vision to return but has not yet. Then restarted the Eliquis again the day after procedure, was unable to get ahold of [place], she states that pt went to [facility] ER for evaluation, then on Monday after he went to see Dr Zadie Rhine to see what could be done. Do you think that there is any correlation to the retina damage and the Eliquis? Wife states that they are concerned, pt is having implants implanted in January and want to know if this should be done again? Any suggestions, or concerns? Please advise.

## 2017-08-14 NOTE — Telephone Encounter (Signed)
New Message   Pt c/o medication issue:  1. Name of Medication: Eliquis  2. How are you currently taking this medication (dosage and times per day)? 5mg  twice a day    3. Are you having a reaction (difficulty breathing--STAT)? no  4. What is your medication issue? Patient had some dental work and was taken off the Eliquis. He went back on the medication and had a bleed and loss vision. He is due to have additional dental service and is concern about going back ff the Eliquis. Please call

## 2017-08-14 NOTE — Telephone Encounter (Signed)
It is difficult to know since I don't know what was found concerning his retina. I would be interested to know if Dr. Zadie Rhine felt that Eliquis was related to his retinal issues or unrelated.   Adriana Quinby Martinique MD, [facility]

## 2017-08-15 NOTE — Telephone Encounter (Signed)
Spoke with pt, he reports dr Zadie Rhine did not mention to him anything to him about the eliquis. He is not due to see dr Zadie Rhine again for 2 months. Will contact dr rankin's office to see if we can get office notes for dr Martinique to review. Left message on voicemail at dr rankin's office to have records faxed.

## 2017-08-16 NOTE — Telephone Encounter (Signed)
[facility]: Hurman Horn MD [phone] [place] FAXED NOTE YESTERDAY AND AND THEY WILL FAX NOTE AGAIN NOW.

## 2017-08-16 NOTE — Telephone Encounter (Signed)
Received eye exam from [place] show to [place] when he is back in office.

## 2017-08-23 ENCOUNTER — Other Ambulatory Visit (HOSPITAL_BASED_OUTPATIENT_CLINIC_OR_DEPARTMENT_OTHER): Payer: Medicare Other

## 2017-08-23 DIAGNOSIS — C180 Malignant neoplasm of cecum: Secondary | ICD-10-CM | POA: Diagnosis present

## 2017-08-23 LAB — CEA (IN HOUSE-CHCC): CEA (CHCC-IN HOUSE): 4.26 ng/mL (ref 0.00–5.00)

## 2017-08-24 ENCOUNTER — Other Ambulatory Visit: Payer: Medicare Other

## 2017-08-27 ENCOUNTER — Telehealth: Payer: Self-pay | Admitting: Emergency Medicine

## 2017-08-27 MED FILL — SHINGRIX 50 MCG SUS: 50 | 1 days supply | Qty: 1 | Fill #1

## 2017-08-27 NOTE — Telephone Encounter (Signed)
CEA results given to patients wife. Next appt date and time given also.

## 2017-09-03 ENCOUNTER — Encounter: Payer: Self-pay | Admitting: Adult Health

## 2017-09-03 ENCOUNTER — Ambulatory Visit (INDEPENDENT_AMBULATORY_CARE_PROVIDER_SITE_OTHER): Payer: Medicare Other | Admitting: Adult Health

## 2017-09-03 VITALS — BP 128/72 | HR 74 | Ht 71.0 in | Wt 176.2 lb

## 2017-09-03 DIAGNOSIS — I2510 Atherosclerotic heart disease of native coronary artery without angina pectoris: Secondary | ICD-10-CM | POA: Diagnosis not present

## 2017-09-03 DIAGNOSIS — I482 Chronic atrial fibrillation: Secondary | ICD-10-CM | POA: Diagnosis not present

## 2017-09-03 DIAGNOSIS — C189 Malignant neoplasm of colon, unspecified: Secondary | ICD-10-CM

## 2017-09-03 DIAGNOSIS — I4821 Permanent atrial fibrillation: Secondary | ICD-10-CM

## 2017-09-03 NOTE — Patient Instructions (Signed)
Medication Instructions:  NO CHANGES-Your physician recommends that you continue on your current medications as directed. Please refer to the Current Medication list given to you today.  If you need a refill on your cardiac medications before your next appointment, please call your pharmacy.  Special Instructions: CLEARED FOR SCHEDULED SURGERY-HOLD PLAVIX 2 DAYS PRIOR. WE WILL CONTACT   Follow-Up: Your physician wants you to follow-up in: 6 MONTHS WITH DR Martinique. You should receive a reminder letter in the mail two months in advance. If you do not receive a letter, please call our office April 2019 to schedule the June 2019 follow-up appointment.   Thank you for choosing [facility] at [facility]!!

## 2017-09-03 NOTE — Progress Notes (Signed)
Cardiology Office Note   Date:  09/03/2017   ID:  Jason [name], DOB 06-28-1926, MRN [id number]  PCP:  [name], MD  Cardiologist:  Dr. Martinique and Dr. Curt Bears Chief Complaint  Patient presents with  . Atrial Fibrillation  . Coronary Artery Disease     History of Present Illness: Jason [name] is a 84 y.o. male who presents for ongoing assessment and management of atrial fib, s/p aortic valve replacement and CABG. (LIMA tao LAD, C=RCA with 40-50% stenosius which is being treated medically.He is on Eliquis for anticoagulation. . He has hx of GIB with AVM.   Telephone note on 08/24/2017 from patient with complaints of bleeding and loss of vision after stopping Eliquis for dental work and then restarting. Dr. Martinique commented that it was difficult to tell if Eliquis was causing this and did not feel Eliquis was causing these symptoms   He comes today feeling much better concerning any bleeding.  He has seen his ophthalmologist who has given him an injection in his left eye which has helped.  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  He denies any chest pain dyspnea or fatigue.  He is being followed by oncology due to colon cancer and continues on Xeloda.  His CEA has been reduced to 4.2.  The patient is also followed by urologist for elevated PSA and prostate cancer,.  He has more dental surgery planned later this month.  And plans to continue the protocol of holding Eliquis 2 days prior to his procedure.   Past Medical History:  Diagnosis Date  . Abnormal PSA   . Adenomatous polyp of colon 2010 and before 2006  . Anemia   . Aortic stenosis   . Aortic valve prosthesis present   . Atrial fibrillation ([place])   . CAD (coronary artery disease)   . DDD (degenerative disc disease), lumbar   . Depression   . Diverticulosis   . Dyslipidemia   . GERD (gastroesophageal reflux disease)   . Glaucoma   . History of blood transfusion 09/29/2016  . Hx of CABG   .  Hypogonadism, male   . Macular degeneration   . OSA (obstructive sleep apnea)   . Osteoarthritis   . Osteopenia   . Prostate CA [facility]) prostate 2007   colon dx 2018, watching psa levels, no tx yet for prostate    Past Surgical History:  Procedure Laterality Date  . AORTIC VALVE REPLACEMENT  October 2011   Magna Ease pericardial tissue valve #51mm  . Lucky   lower  . CATARACT EXTRACTION Bilateral   . CORNEAL TRANSPLANT Right   . CORONARY ARTERY BYPASS GRAFT  October 2011   LIMA to LAD  . ESOPHAGOGASTRODUODENOSCOPY N/A 09/16/2014   Procedure: ESOPHAGOGASTRODUODENOSCOPY (EGD);  Surgeon: [name], MD;  Location: [facility] ENDOSCOPY;  Service: Endoscopy;  Laterality: N/A;  . TONSILLECTOMY  76 yrs ago     Current Outpatient Medications  Medication Sig Dispense Refill  . acetaminophen (TYLENOL) 500 MG tablet Take 500 mg by mouth every 6 (six) hours as needed for moderate pain.     [name] apixaban (ELIQUIS) 5 MG TABS tablet Take 5 mg by mouth 2 (two) times daily.    . Calcium Carbonate-Vitamin D (CALCIUM-VITAMIN D) 500-200 MG-UNIT per tablet Take 1 tablet by mouth daily.    . capecitabine (XELODA) 500 MG tablet Take 2 tablets by mouth twice daily after a meal. Take for 14 days on, 7 days  off. Start 03/27/2017 56 tablet 0  . Cholecalciferol 1000 UNITS capsule Take 1,000 Units by mouth daily.     . dorzolamide (TRUSOPT) 2 % ophthalmic solution PLACE 1 DROP INTO BOTH EYES 3 TIMES DAILY.  11  . fluticasone (FLONASE) 50 MCG/ACT nasal spray Place 1 spray into both nostrils daily as needed for allergies or rhinitis.     [name] glucosamine-chondroitin 500-400 MG tablet Take 1 tablet by mouth 2 (two) times daily.    [name] latanoprost (XALATAN) 0.005 % ophthalmic solution Place 1 drop into both eyes daily.  11  . loperamide (IMODIUM) 2 MG capsule Take 2 mg by mouth as needed for diarrhea or loose stools.    . Multiple Vitamin (MULTIVITAMIN WITH MINERALS) TABS tablet Take 1 tablet by mouth daily.    [name]  omega-3 acid ethyl esters (LOVAZA) 1 G capsule Take 1 g by mouth daily.     [name] perphenazine-amitriptyline (ETRAFON/TRIAVIL) 4-25 MG TABS Take 1 tablet by mouth at bedtime.    . prednisoLONE acetate (PRED FORTE) 1 % ophthalmic suspension Place 1 drop into the right eye daily at 12 noon.     . prochlorperazine (COMPAZINE) 5 MG tablet Take 1 tablet (5 mg total) by mouth every 6 (six) hours as needed for nausea or vomiting. 30 tablet 0  . ranitidine (ZANTAC) 150 MG tablet Take 150 mg by mouth daily.    . rosuvastatin (CRESTOR) 5 MG tablet Take 5 mg by mouth every other day.     . vitamin B-12 (CYANOCOBALAMIN) 1000 MCG tablet Take 1,000 mcg by mouth daily.    . metoprolol tartrate (LOPRESSOR) 25 MG tablet Take 1 tablet (25 mg total) by mouth 2 (two) times daily. 180 tablet 3   No current facility-administered medications for this visit.     Allergies:   Patient has no known allergies.    Social History:  The patient  reports that he quit smoking about 26 years ago. His smoking use included cigarettes. He has a 40.00 pack-year smoking history. he has never used smokeless tobacco. He reports that he drinks about 1.2 oz of alcohol per week. He reports that he does not use drugs.   Family History:  The patient's family history includes Heart attack in his father.    ROS: All other systems are reviewed and negative. Unless otherwise mentioned in H&P    PHYSICAL EXAM: VS:  BP 128/72   Pulse 74   Ht 5\' 11"  (1.803 m)   Wt 176 lb 3.2 oz (79.9 kg)   BMI 24.57 kg/m  , BMI Body mass index is 24.57 kg/m. GEN: Well nourished, well developed, in no acute distress  HEENT: normal  Neck: no JVD, carotid bruits, or masses Cardiac: IRRR; 1/6 systolic murmurs, rubs, or gallops,no edema  Respiratory:  Clear to auscultation bilaterally, normal work of breathing GI: soft, nontender, nondistended, + BS MS: no deformity or atrophy  Skin: warm and dry, no rash Neuro:  Strength and sensation are intact Psych:  euthymic mood, full affect    Recent Labs: 10/25/2016: Magnesium 1.8 04/11/2017: ALT 19; BUN 13.7; Creatinine 1.0; Potassium 3.8; Sodium 139 05/09/2017: HGB 13.7; Platelets 143    Lipid Panel    Component Value Date/Time   CHOL 89 01/03/2012 0835   TRIG 125.0 01/03/2012 0835   HDL 37.40 (L) 01/03/2012 0835   CHOLHDL 2 01/03/2012 0835   VLDL 25.0 01/03/2012 0835   LDLCALC 27 01/03/2012 0835      Wt Readings from Last 3  Encounters:  09/03/17 176 lb 3.2 oz (79.9 kg)  05/09/17 177 lb 12.8 oz (80.6 kg)  04/11/17 175 lb 11.2 oz (79.7 kg)      Other studies Reviewed:  Echocardiogram 07/17/2014 Left ventricle: The cavity size was normal. Wall thickness was increased in a pattern of mild LVH. There was mild concentric hypertrophy. Systolic function was normal. The estimated ejection fraction was in the range of 55% to 60%. Wall motion was normal; there were no regional wall motion abnormalities. - Ventricular septum: Septal motion showed paradox. - Aortic valve: A bioprosthesis was present and functioning normally. Valve area (VTI): 1.6 cm^2. Valve area (Vmax): 1.5 cm^2. Valve area (Vmean): 1.33 cm^2. - Mitral valve: Calcified, moderately to severely fibrotic annulus. Mildly thickened leaflets . The findings are consistent with trivial stenosis. There was mild regurgitation directed centrally. Valve area by continuity equation (using LVOT flow): 2.6 cm^2. - Left atrium: The atrium was moderately dilated. - Right atrium: The atrium was mildly dilated.   ASSESSMENT AND PLAN:  1.  Atrial fibrillation: Heart rate is well controlled.  He denies any frank bleeding, hemoptysis, or melena.  He will continue on Eliquis as directed.  It is okay for him to hold it for 2 days prior to his dental surgery and resume immediately thereafter.  2.  Coronary artery disease: History of aortic valve replacement with coronary artery bypass grafting.  He continues to have  residual stenosis in the RCA which is now being treated medically.  Plans for ischemic testing as he is doing well and asymptomatic.  3.  History of macular degeneration: He is continued to be followed by ophthalmologist. Current medicines are reviewed at length with the patient today.    4.  Colon cancer: Continues on Xeloda.  Followed by oncology.  Labs/ tests ordered today include: None  Phill Myron. [place] Pugh, ANP, AACC   09/03/2017 10:50 AM    [place] [address] [place], [place] [address] Phone: [phone]; Fax: [phone]

## 2017-09-06 ENCOUNTER — Telehealth: Payer: Self-pay | Admitting: Oncology

## 2017-09-06 NOTE — Telephone Encounter (Signed)
Patient called to reschedule due to prior appointment

## 2017-09-26 DIAGNOSIS — H353134 Nonexudative age-related macular degeneration, bilateral, advanced atrophic with subfoveal involvement: Secondary | ICD-10-CM | POA: Diagnosis not present

## 2017-09-26 DIAGNOSIS — H353222 Exudative age-related macular degeneration, left eye, with inactive choroidal neovascularization: Secondary | ICD-10-CM | POA: Diagnosis not present

## 2017-09-26 DIAGNOSIS — H3562 Retinal hemorrhage, left eye: Secondary | ICD-10-CM | POA: Diagnosis not present

## 2017-09-26 DIAGNOSIS — H353212 Exudative age-related macular degeneration, right eye, with inactive choroidal neovascularization: Secondary | ICD-10-CM | POA: Diagnosis not present

## 2017-10-01 DIAGNOSIS — C61 Malignant neoplasm of prostate: Secondary | ICD-10-CM | POA: Diagnosis not present

## 2017-10-10 ENCOUNTER — Ambulatory Visit: Payer: Medicare Other | Admitting: Cardiology

## 2017-10-11 DIAGNOSIS — C180 Malignant neoplasm of cecum: Secondary | ICD-10-CM | POA: Diagnosis not present

## 2017-10-11 DIAGNOSIS — C61 Malignant neoplasm of prostate: Secondary | ICD-10-CM | POA: Diagnosis not present

## 2017-10-11 DIAGNOSIS — R972 Elevated prostate specific antigen [PSA]: Secondary | ICD-10-CM | POA: Diagnosis not present

## 2017-10-12 ENCOUNTER — Other Ambulatory Visit (HOSPITAL_COMMUNITY): Payer: Self-pay | Admitting: Urology

## 2017-10-12 DIAGNOSIS — C61 Malignant neoplasm of prostate: Secondary | ICD-10-CM

## 2017-10-19 ENCOUNTER — Encounter (HOSPITAL_COMMUNITY)
Admission: RE | Admit: 2017-10-19 | Discharge: 2017-10-19 | Disposition: A | Discharge status: Home / Self Care | Payer: Medicare Other | Source: Ambulatory Visit | Attending: Urology | Admitting: Urology

## 2017-10-19 DIAGNOSIS — C61 Malignant neoplasm of prostate: Secondary | ICD-10-CM | POA: Insufficient documentation

## 2017-10-19 IMAGING — NM NM BONE WHOLE BODY
2 series · 2 of 2 positions shown · non-contrast
Comparison: Bone scan of [DATE]. CT scan of [DATE]. Radiographs [DATE].

CLINICAL DATA: Current history of prostate cancer.

EXAM:
NUCLEAR MEDICINE WHOLE BODY BONE SCAN
TECHNIQUE: Whole body anterior and posterior images were obtained approximately
3 hours after intravenous injection of radiopharmaceutical.
RADIOPHARMACEUTICALS:  22.0 mCi [H5] MDP IV

[Series 1: wbr_bone_40 whole body · 2.66mm/px · 1 of 1 slices shown (1 of 2)]
[im 1/1]
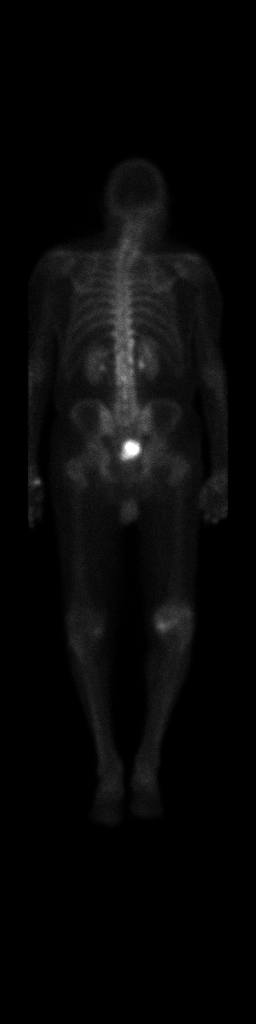

[Series 1: wbr_bone_40 whole body · 2.66mm/px · 1 of 1 slices shown (2 of 2)]
[im 1/1]
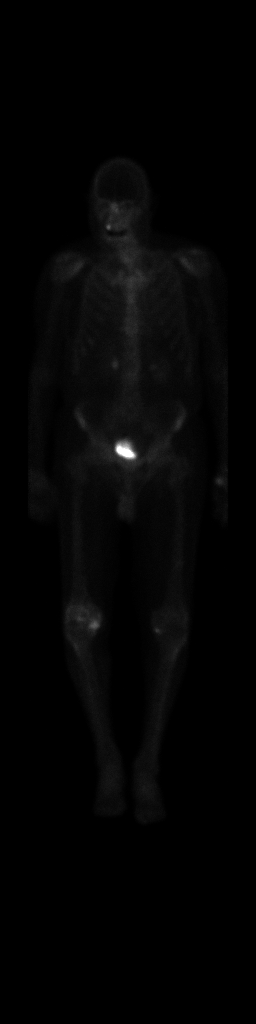

[2 of 2 positions shown; findings below may reference images not displayed]

FINDINGS: Abnormal uptake is seen in both knees and shoulders consistent with
degenerative change. Stable abnormal uptake is seen at the L2-3
level most consistent with degenerative change as seen on CT scan.
Mild focal uptake is seen in the distal left femoral shaft which may
be slightly increased compared to prior exam. No new areas of
abnormal uptake are noted.
IMPRESSION: Abnormal uptake is again noted in both knees and shoulders
consistent with degenerative change.

Stable abnormal uptake is noted in the lumbar spine most consistent
with degenerative change.

Mild focal uptake is seen in the distal left femoral shaft which may
be slightly more prominent compared to prior exam. Plain film
radiographs of this area are recommended to allow direct comparison
to prior radiographs of [H5], and evaluate for possible metastatic
disease.

## 2017-10-19 MED ORDER — TECHNETIUM TC 99M MEDRONATE IV KIT
25.0000 | PACK | Freq: Once | INTRAVENOUS | Status: AC | PRN
  Administered 2017-10-19: 22 via INTRAVENOUS

## 2017-10-29 DIAGNOSIS — H401123 Primary open-angle glaucoma, left eye, severe stage: Secondary | ICD-10-CM | POA: Diagnosis not present

## 2017-10-29 DIAGNOSIS — H401111 Primary open-angle glaucoma, right eye, mild stage: Secondary | ICD-10-CM | POA: Diagnosis not present

## 2017-10-30 DIAGNOSIS — E7849 Other hyperlipidemia: Secondary | ICD-10-CM | POA: Diagnosis not present

## 2017-10-30 DIAGNOSIS — R82998 Other abnormal findings in urine: Secondary | ICD-10-CM | POA: Diagnosis not present

## 2017-10-30 DIAGNOSIS — Z125 Encounter for screening for malignant neoplasm of prostate: Secondary | ICD-10-CM | POA: Diagnosis not present

## 2017-10-30 DIAGNOSIS — M859 Disorder of bone density and structure, unspecified: Secondary | ICD-10-CM | POA: Diagnosis not present

## 2017-10-30 DIAGNOSIS — R7301 Impaired fasting glucose: Secondary | ICD-10-CM | POA: Diagnosis not present

## 2017-10-30 DIAGNOSIS — E538 Deficiency of other specified B group vitamins: Secondary | ICD-10-CM | POA: Diagnosis not present

## 2017-11-05 DIAGNOSIS — H353134 Nonexudative age-related macular degeneration, bilateral, advanced atrophic with subfoveal involvement: Secondary | ICD-10-CM | POA: Diagnosis not present

## 2017-11-05 DIAGNOSIS — H353212 Exudative age-related macular degeneration, right eye, with inactive choroidal neovascularization: Secondary | ICD-10-CM | POA: Diagnosis not present

## 2017-11-05 DIAGNOSIS — H353222 Exudative age-related macular degeneration, left eye, with inactive choroidal neovascularization: Secondary | ICD-10-CM | POA: Diagnosis not present

## 2017-11-05 DIAGNOSIS — H3562 Retinal hemorrhage, left eye: Secondary | ICD-10-CM | POA: Diagnosis not present

## 2017-11-06 ENCOUNTER — Other Ambulatory Visit: Payer: Medicare Other

## 2017-11-06 ENCOUNTER — Ambulatory Visit: Payer: Medicare Other | Admitting: Oncology

## 2017-11-06 DIAGNOSIS — I7389 Other specified peripheral vascular diseases: Secondary | ICD-10-CM | POA: Diagnosis not present

## 2017-11-06 DIAGNOSIS — C61 Malignant neoplasm of prostate: Secondary | ICD-10-CM | POA: Diagnosis not present

## 2017-11-06 DIAGNOSIS — Z952 Presence of prosthetic heart valve: Secondary | ICD-10-CM | POA: Diagnosis not present

## 2017-11-06 DIAGNOSIS — I493 Ventricular premature depolarization: Secondary | ICD-10-CM | POA: Diagnosis not present

## 2017-11-06 DIAGNOSIS — Z1389 Encounter for screening for other disorder: Secondary | ICD-10-CM | POA: Diagnosis not present

## 2017-11-06 DIAGNOSIS — Z7901 Long term (current) use of anticoagulants: Secondary | ICD-10-CM | POA: Diagnosis not present

## 2017-11-06 DIAGNOSIS — Z9989 Dependence on other enabling machines and devices: Secondary | ICD-10-CM | POA: Diagnosis not present

## 2017-11-06 DIAGNOSIS — I480 Paroxysmal atrial fibrillation: Secondary | ICD-10-CM | POA: Diagnosis not present

## 2017-11-06 DIAGNOSIS — Z6825 Body mass index (BMI) 25.0-25.9, adult: Secondary | ICD-10-CM | POA: Diagnosis not present

## 2017-11-06 DIAGNOSIS — Z0000 Encounter for general adult medical examination without abnormal findings: Secondary | ICD-10-CM | POA: Diagnosis not present

## 2017-11-06 DIAGNOSIS — E538 Deficiency of other specified B group vitamins: Secondary | ICD-10-CM | POA: Diagnosis not present

## 2017-11-06 DIAGNOSIS — C180 Malignant neoplasm of cecum: Secondary | ICD-10-CM | POA: Diagnosis not present

## 2017-11-07 ENCOUNTER — Ambulatory Visit (HOSPITAL_COMMUNITY)
Admission: RE | Admit: 2017-11-07 | Discharge: 2017-11-07 | Disposition: A | Discharge status: Home / Self Care | Payer: Medicare Other | Source: Ambulatory Visit | Attending: Urology | Admitting: Urology

## 2017-11-07 ENCOUNTER — Other Ambulatory Visit (HOSPITAL_COMMUNITY): Payer: Self-pay | Admitting: Urology

## 2017-11-07 DIAGNOSIS — C61 Malignant neoplasm of prostate: Secondary | ICD-10-CM

## 2017-11-07 DIAGNOSIS — R9389 Abnormal findings on diagnostic imaging of other specified body structures: Secondary | ICD-10-CM | POA: Insufficient documentation

## 2017-11-12 ENCOUNTER — Inpatient Hospital Stay (HOSPITAL_BASED_OUTPATIENT_CLINIC_OR_DEPARTMENT_OTHER): Payer: Medicare Other | Admitting: Oncology

## 2017-11-12 ENCOUNTER — Telehealth: Payer: Self-pay

## 2017-11-12 ENCOUNTER — Inpatient Hospital Stay: Payer: Medicare Other | Attending: Oncology

## 2017-11-12 VITALS — BP 119/52 | HR 72 | Temp 97.7°F | Wt 178.1 lb

## 2017-11-12 DIAGNOSIS — Z952 Presence of prosthetic heart valve: Secondary | ICD-10-CM | POA: Insufficient documentation

## 2017-11-12 DIAGNOSIS — I4891 Unspecified atrial fibrillation: Secondary | ICD-10-CM | POA: Insufficient documentation

## 2017-11-12 DIAGNOSIS — Z8546 Personal history of malignant neoplasm of prostate: Secondary | ICD-10-CM

## 2017-11-12 DIAGNOSIS — Z951 Presence of aortocoronary bypass graft: Secondary | ICD-10-CM | POA: Diagnosis not present

## 2017-11-12 DIAGNOSIS — C180 Malignant neoplasm of cecum: Secondary | ICD-10-CM

## 2017-11-12 DIAGNOSIS — H409 Unspecified glaucoma: Secondary | ICD-10-CM | POA: Diagnosis not present

## 2017-11-12 DIAGNOSIS — Z85038 Personal history of other malignant neoplasm of large intestine: Secondary | ICD-10-CM

## 2017-11-12 DIAGNOSIS — Z7901 Long term (current) use of anticoagulants: Secondary | ICD-10-CM | POA: Insufficient documentation

## 2017-11-12 DIAGNOSIS — Z9049 Acquired absence of other specified parts of digestive tract: Secondary | ICD-10-CM | POA: Diagnosis not present

## 2017-11-12 DIAGNOSIS — I2510 Atherosclerotic heart disease of native coronary artery without angina pectoris: Secondary | ICD-10-CM | POA: Diagnosis not present

## 2017-11-12 LAB — CEA (IN HOUSE-CHCC): CEA (CHCC-IN HOUSE): 4.8 ng/mL (ref 0.00–5.00)

## 2017-11-12 NOTE — Telephone Encounter (Signed)
Spoke with pts wife and she is aware of appt date and time.

## 2017-11-12 NOTE — Progress Notes (Signed)
  Jason [name] OFFICE PROGRESS NOTE   Diagnosis: Colon cancer  INTERVAL HISTORY:   Jason [name] returns as scheduled.  He feels well.  Good appetite.  No difficulty with bowel function.  No new complaint.  He is followed by urology for an elevated PSA.  A bone scan 10/19/2017 revealed mild focal uptake in the distal left femur felt to be slightly more prominent than on a bone scan in 2017.  A plain film of the left femur was unchanged compared to 2015.  Objective:  Vital signs in last 24 hours:  Blood pressure (!) 119/52, pulse 72, temperature 97.7 F (36.5 C), temperature source Oral, SpO2 96 %.    HEENT: Neck without mass Lymphatics: No cervical, supraclavicular, axillary, or inguinal nodes Resp: Lungs clear bilaterally Cardio: Regular rate and rhythm GI: No hepatosplenomegaly, no mass, nontender Vascular: No leg edema   Lab Results:   Lab Results  Component Value Date   CEA1 4.80 11/12/2017     Medications: I have reviewed the patient's current medications.   Assessment/Plan: 1. Colon cancer, cecum, stage IIIB (T4a,N1b), status post a right colectomy 10/24/2016  3/23 lymph nodes positive for metastatic adenocarcinoma  MSI-stable, no loss of mismatch repair protein expression  Cycle 1 adjuvant Xeloda 11/20/2016  Cycle 2 adjuvant Xeloda 12/12/2016 (dose reduced due to mucositis following cycle 1)  Cycle 3 adjuvant Xeloda 01/02/2017  Cycle 4 adjuvant Xeloda 01/23/2017  Cycle 5 adjuvant Xeloda 02/13/2017  Cycle 6 adjuvant Xeloda 03/06/2017  Cycle 7 adjuvant Xeloda 03/27/2017  Cycle 8 adjuvant Xeloda 04/17/2017  2. History of iron deficiency anemia secondary to #1, status post IV iron therapy and multiple red cell transfusions; hemoglobin improved 11/16/2016;hemoglobin normal range 01/17/2017  3. Prostate cancer followed by urology with observation  4. History of coronary artery disease, status post coronary artery bypass surgery  2011  5. Aortic valve replacement 2011  6. Atrial fibrillation-previously maintained on Coumadin;switched to Eliquisdue to known interaction between Coumadin and Xeloda  7. Glaucoma  8. Mucositis secondary to Xeloda following cycle 1; Xeloda dose reduced beginning with cycle 2.  Disposition: Jason [name] remains in clinical remission from colon cancer.  He will return for an office visit and CEA in 6 months.  I will contact Dr. Henrene Pastor regarding the indication for a surveillance colonoscopy.  He is followed by urology with observation for prostate cancer.  15 minutes were spent with the patient today.  The majority of the time was used for counseling and coordination of care.  Betsy Coder, MD  11/12/2017  9:52 AM

## 2017-11-12 NOTE — Telephone Encounter (Signed)
-----   Message from [name], MD sent at 11/12/2017 11:20 AM EDT ----- What does he think about it? I don't feel strongly, but if he is in good shape and willing (particularly since he went through adjuvant therapy) I am happy to have him come into the office to discuss and set up. I will have my nurse make him an office visit to see me. Thanks Barbette Merino, See below and above. Thanks jp ----- Message ----- From: Ladell Pier, MD Sent: 11/12/2017  10:12 AM To: [name], MD  He is 1 year out from a diagnosis of stage III colon cancer He turns 76 in May.  What do you think about a surveillance colonoscopy?  Thanks,  [employer]

## 2017-11-12 NOTE — Telephone Encounter (Signed)
Pt scheduled to see Dr. Henrene Pastor 12/28/17@1 :30pm. Attempted to reach pt by phone but no answer. Will try again later.

## 2017-11-12 NOTE — Telephone Encounter (Signed)
Printed avs and calender of upcoming appointment. Per 3/18 los 

## 2017-11-23 DIAGNOSIS — Z1212 Encounter for screening for malignant neoplasm of rectum: Secondary | ICD-10-CM | POA: Diagnosis not present

## 2017-11-25 ENCOUNTER — Other Ambulatory Visit: Payer: Self-pay | Admitting: Physician Assistant

## 2017-11-26 NOTE — Telephone Encounter (Signed)
Refill Request.  

## 2017-11-26 NOTE — Telephone Encounter (Signed)
REFILL 

## 2017-12-28 ENCOUNTER — Encounter: Payer: Self-pay | Admitting: Internal Medicine

## 2017-12-28 ENCOUNTER — Ambulatory Visit (INDEPENDENT_AMBULATORY_CARE_PROVIDER_SITE_OTHER): Payer: Medicare Other | Admitting: Internal Medicine

## 2017-12-28 VITALS — BP 100/60 | HR 72 | Ht 71.0 in | Wt 175.4 lb

## 2017-12-28 DIAGNOSIS — Z85038 Personal history of other malignant neoplasm of large intestine: Secondary | ICD-10-CM

## 2017-12-28 DIAGNOSIS — Z7901 Long term (current) use of anticoagulants: Secondary | ICD-10-CM | POA: Diagnosis not present

## 2017-12-28 DIAGNOSIS — I2510 Atherosclerotic heart disease of native coronary artery without angina pectoris: Secondary | ICD-10-CM

## 2017-12-28 NOTE — Patient Instructions (Signed)

## 2017-12-28 NOTE — Progress Notes (Signed)
HISTORY OF PRESENT ILLNESS:  Jason [name] is a 84 y.o. male with multiple medical problems including chronic atrial fibrillation for which he is on Eliquis. He is sent today by his oncologist Dr. Benay Spice regarding surveillance colonoscopy. The patient underwent colonoscopy 09/06/2016 to evaluate right lower quadrant pain, iron deficiency anemia, and an abnormal CT scan. 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 He has been doing well since. Recently evaluated by Dr. Benay Spice. Discussions regarding 1 year post-colectomy surveillance raised. The patient is accompanied by his wife. He remains in currently active and is doing well. Last hemoglobin 13.7. Unremarkable comprehensive metabolic panel. Last CEA 4.8. Review of imaging in the form of CT scan March 2018 shows right hemicolectomy without other abnormalities. Patient is currently under active surveillance for prostate cancer  REVIEW OF SYSTEMS:  All non-GI ROS negative except for cough, back pain, ankle swelling  Past Medical History:  Diagnosis Date  . Abnormal PSA   . Adenomatous polyp of colon 2010 and before 2006  . Anemia   . Aortic stenosis   . Aortic valve prosthesis present   . Atrial fibrillation ([place])   . CAD (coronary artery disease)   . DDD (degenerative disc disease), lumbar   . Depression   . Diverticulosis   . Dyslipidemia   . GERD (gastroesophageal reflux disease)   . Glaucoma   . History of blood transfusion 09/29/2016  . Hx of CABG   . Hypogonadism, male   . Macular degeneration   . OSA (obstructive sleep apnea)   . Osteoarthritis   . Osteopenia   . Prostate CA [facility]) prostate 2007   colon dx 2018, watching psa levels, no tx yet for prostate    Past Surgical History:  Procedure Laterality Date  . AORTIC VALVE REPLACEMENT  October 2011   Magna Ease pericardial tissue valve #56mm  . [place]   lower  . CATARACT EXTRACTION Bilateral   .  CORNEAL TRANSPLANT Right   . CORONARY ARTERY BYPASS GRAFT  October 2011   LIMA to LAD  . ESOPHAGOGASTRODUODENOSCOPY N/A 09/16/2014   Procedure: ESOPHAGOGASTRODUODENOSCOPY (EGD);  Surgeon: [name], MD;  Location: [facility] ENDOSCOPY;  Service: Endoscopy;  Laterality: N/A;  . TONSILLECTOMY  76 yrs ago    Social History Jason [name]  reports that he quit smoking about 27 years ago. His smoking use included cigarettes. He has a 40.00 pack-year smoking history. He has never used smokeless tobacco. He reports that he drinks about 1.2 oz of alcohol per week. He reports that he does not use drugs.  family history includes Heart attack in his father.  No Known Allergies     PHYSICAL EXAMINATION: Vital signs: BP 100/60   Pulse 72   Ht 5\' 11"  (1.803 m)   Wt 175 lb 6 oz (79.5 kg)   BMI 24.46 kg/m   Constitutional: generally well-appearing, no acute distress Psychiatric: alert and oriented x3, cooperative Eyes: extraocular movements intact, anicteric, conjunctiva pink Mouth: oral pharynx moist, no lesions Neck: supple no lymphadenopathy Cardiovascular: heart regular rate and rhythm, no murmur Lungs: clear to auscultation bilaterally Abdomen: soft, nontender, nondistended, no obvious ascites, no peritoneal signs, normal bowel sounds, no organomegaly Rectal:deferred until colonoscopy Extremities: no clubbing cyanosis or lower extremity edema bilaterally Skin: no lesions on visible extremities Neuro: No focal deficits. Cranial nerves intact  ASSESSMENT:  #1. 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 Doing well without  evidence of disease. Consideration of surveillance colonoscopy per his oncologist and the patient. Deemed appropriate candidate without contraindication despite his age. Further baseline risk given his comorbidities and the need to address chronic anticoagulation therapy. Anticoagulation therapy last are held for his procedure as well as  surgery.   PLAN:  #1. Colonoscopy.The nature of the procedure, as well as the risks, benefits, and alternatives were carefully and thoroughly reviewed with the patient. Ample time for discussion and questions allowed. The patient understood, was satisfied, and agreed to proceed. #2. Hold Eliquis 2 days prior to the procedure.

## 2018-01-27 NOTE — Progress Notes (Signed)
[place] Date of Birth: 03/14/1926   History of Present Illness: [name] is seen today for follow up of atrial fibrillation. He is status post aortic valve replacement and coronary bypass surgery in October of 2011. He has a tissue valve prosthesis. He had a LIMA to the LAD. The RCA had 40-50% stenosis treated medically.  He has  atrial fibrillation. He was treated with rate control and anticoagulation with Xarelto. In January 2016 he was admitted with a GIB. Xarelto was held. Hbg dropped to 9.7. he did not require transfusion. 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 Since then he has been on coumadin. He reports today that in May 2017 his Hgb dropped. He was transfused.  No obvious bleeding.  He was started on iron infusions.  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 He was discharged on 10/27/2016, however returned the same afternoon with complaint of GI bleed. He was seen by Dr. Curt Bears on 10/29/2016, his beta blocker was increased to help rate control with his permanent atrial fibrillation. There was some wide complex tachycardia c/w aberrancy versus VT.  He was started on Xeloda and due to interaction with Coumadin he was switched to Eliquis. Hgb has been stable. He reports last Hgb 13.7. He is planning to have follow up colonoscopy with Dr. Henrene Pastor.  On follow up today he is doing well. Main complaint is of arthritic pain in right knee and neuropathic pain in left leg. Lives at [facility]. Exercises regularly. Energy level is ok. No bleeding, dyspnea, chest pain or palpitations. No dizziness.   Current Outpatient Medications on File Prior to Visit  Medication Sig Dispense Refill  . acetaminophen (TYLENOL) 500 MG tablet Take 500 mg by mouth every 6 (six) hours as needed for moderate pain.     [name] apixaban (ELIQUIS) 5 MG TABS tablet Take 5 mg by mouth 2 (two) times daily.    . Calcium Carbonate-Vitamin D  (CALCIUM-VITAMIN D) 500-200 MG-UNIT per tablet Take 1 tablet by mouth daily.    . Cholecalciferol 1000 UNITS capsule Take 1,000 Units by mouth daily.     . dorzolamide (TRUSOPT) 2 % ophthalmic solution PLACE 1 DROP INTO BOTH EYES 3 TIMES DAILY.  11  . fluticasone (FLONASE) 50 MCG/ACT nasal spray Place 1 spray into both nostrils daily as needed for allergies or rhinitis.     [name] glucosamine-chondroitin 500-400 MG tablet Take 1 tablet by mouth 2 (two) times daily.    [name] latanoprost (XALATAN) 0.005 % ophthalmic solution Place 1 drop into both eyes daily.  11  . loperamide (IMODIUM) 2 MG capsule Take 2 mg by mouth as needed for diarrhea or loose stools.    . metoprolol tartrate (LOPRESSOR) 25 MG tablet TAKE 1 TABLET BY MOUTH TWICE A DAY 180 tablet 0  . Multiple Vitamin (MULTIVITAMIN WITH MINERALS) TABS tablet Take 1 tablet by mouth daily.    [name] omega-3 acid ethyl esters (LOVAZA) 1 G capsule Take 1 g by mouth daily.     [name] perphenazine-amitriptyline (ETRAFON/TRIAVIL) 4-25 MG TABS Take 1 tablet by mouth at bedtime.    . prednisoLONE acetate (PRED FORTE) 1 % ophthalmic suspension Place 1 drop into the right eye every other day.     . prochlorperazine (COMPAZINE) 5 MG tablet Take 1 tablet (5 mg total) by mouth every 6 (six) hours as needed for nausea or vomiting. 30 tablet 0  . ranitidine (ZANTAC) 150  MG tablet Take 150 mg by mouth daily.    . rosuvastatin (CRESTOR) 5 MG tablet Take 5 mg by mouth every other day.     . vitamin B-12 (CYANOCOBALAMIN) 1000 MCG tablet Take 1,000 mcg by mouth daily.     No current facility-administered medications on file prior to visit.     No Known Allergies  Past Medical History:  Diagnosis Date  . Abnormal PSA   . Adenomatous polyp of colon 2010 and before 2006  . Anemia   . Aortic stenosis   . Aortic valve prosthesis present   . Atrial fibrillation ([place])   . CAD (coronary artery disease)   . DDD (degenerative disc disease), lumbar   . Depression   .  Diverticulosis   . Dyslipidemia   . GERD (gastroesophageal reflux disease)   . Glaucoma   . History of blood transfusion 09/29/2016  . Hx of CABG   . Hypogonadism, male   . Macular degeneration   . OSA (obstructive sleep apnea)   . Osteoarthritis   . Osteopenia   . Prostate CA [facility]) prostate 2007   colon dx 2018, watching psa levels, no tx yet for prostate    Past Surgical History:  Procedure Laterality Date  . AORTIC VALVE REPLACEMENT  October 2011   Magna Ease pericardial tissue valve #89mm  . [place]   lower  . CATARACT EXTRACTION Bilateral   . CORNEAL TRANSPLANT Right   . CORONARY ARTERY BYPASS GRAFT  October 2011   LIMA to LAD  . ESOPHAGOGASTRODUODENOSCOPY N/A 09/16/2014   Procedure: ESOPHAGOGASTRODUODENOSCOPY (EGD);  Surgeon: [name], MD;  Location: [facility] ENDOSCOPY;  Service: Endoscopy;  Laterality: N/A;  . TONSILLECTOMY  76 yrs ago    Social History   Tobacco Use  Smoking Status Former Smoker  . Packs/day: 1.00  . Years: 40.00  . Pack years: 40.00  . Types: Cigarettes  . Last attempt to quit: 11/16/1990  . Years since quitting: 27.2  Smokeless Tobacco Never Used    Social History   Substance and Sexual Activity  Alcohol Use Yes  . Alcohol/week: 1.2 oz  . Types: 2 Glasses of wine per week   Comment: wine few x per week    Family History  Problem Relation Age of Onset  . Heart attack Father   . Colon cancer Neg Hx     Review of Systems: As noted in history of present illness. s All other systems were reviewed and are negative.  Physical Exam: BP 106/72   Pulse 62   Ht 5\' 11"  (1.803 m)   Wt 175 lb 9.6 oz (79.7 kg)   BMI 24.49 kg/m  GENERAL:  Well appearing elderly WM in NAD. HEENT:  PERRL, EOMI, sclera are clear. Oropharynx is clear. NECK:  No jugular venous distention, carotid upstroke brisk and symmetric, no bruits, no thyromegaly or adenopathy LUNGS:  Clear to auscultation bilaterally CHEST:  Unremarkable HEART:  IRRR,  PMI  not displaced or sustained,S1 and S2 within normal limits, no S3, no S4: no clicks, no rubs, gr 3-6/1 aortic outflow murmur. ABD:  Soft, nontender. BS +, no masses or bruits. No hepatomegaly, no splenomegaly EXT:  2 + pulses throughout, no edema, no cyanosis no clubbing SKIN:  Warm and dry.  No rashes NEURO:  Alert and oriented x 3. Cranial nerves II through XII intact. PSYCH:  Cognitively intact    LABORATORY DATA:    Labs dated 07/02/15: cholesterol 97, triglycerides 176, LDL 30,  HDL 32.  Dated 01/12/16: A1c 4.7% Dated 08/10/16; BUN 15.5, creatinine 1.1. Other CMET normal. Dated 02/06/17: normal CMET. Dated 10/30/17: cholesterol 115, triglycerides 174, HDL 37, LDL 43. A1c 5.4%. Creatinine 1.2. Other chemistries, CBC, and TSH normal.   Ecg today shows Afib rate 62. Old anteroseptal infarct. LAD. I have personally reviewed and interpreted this study.  Assessment / Plan: 1. Status post tissue aortic valve replacement for severe aortic stenosis in October 2011. Clinically doing well. Good valve function by Echo November 2015.  2. Coronary disease status post CABG with an LIMA graft to the LAD in October of 2011. He is asymptomatic.  3. Dyslipidemia excellent control on Crestor and fish oil.   4. PVCs. These are chronic. Asymptomatic.  5. Atrial fibrillation. Chronic. Asymptomatic. Rate controlled on metoprolol. He has a [place] vasc score of 4 placing him at higher risk of CVA. History of GIB on Xarelto. Now on Eliquis due to interaction of Coumadin with Xeloda. Hgb has been stable.  Hopefully with diagnosis and Rx of colon CA he will not have further bleeding.   6. GIB. Related to gastric AVM in the past. More recently dx with cecal CA. Stable Hgb.   7. Cecal CA. S/p surgery and now on Xeloda. Plan follow up colonoscopy with Dr. Henrene Pastor. OK to hold Eliquis 2 days for this.  I will see in 6 months.

## 2018-01-28 DIAGNOSIS — Z6825 Body mass index (BMI) 25.0-25.9, adult: Secondary | ICD-10-CM | POA: Diagnosis not present

## 2018-01-28 DIAGNOSIS — M79662 Pain in left lower leg: Secondary | ICD-10-CM | POA: Diagnosis not present

## 2018-01-28 DIAGNOSIS — M179 Osteoarthritis of knee, unspecified: Secondary | ICD-10-CM | POA: Diagnosis not present

## 2018-01-30 ENCOUNTER — Ambulatory Visit (INDEPENDENT_AMBULATORY_CARE_PROVIDER_SITE_OTHER): Payer: Medicare Other | Admitting: Cardiology

## 2018-01-30 ENCOUNTER — Encounter: Payer: Self-pay | Admitting: Cardiology

## 2018-01-30 VITALS — BP 106/72 | HR 62 | Ht 71.0 in | Wt 175.6 lb

## 2018-01-30 DIAGNOSIS — I2510 Atherosclerotic heart disease of native coronary artery without angina pectoris: Secondary | ICD-10-CM | POA: Diagnosis not present

## 2018-01-30 DIAGNOSIS — I482 Chronic atrial fibrillation: Secondary | ICD-10-CM | POA: Diagnosis not present

## 2018-01-30 DIAGNOSIS — I4821 Permanent atrial fibrillation: Secondary | ICD-10-CM

## 2018-01-30 DIAGNOSIS — Z952 Presence of prosthetic heart valve: Secondary | ICD-10-CM | POA: Diagnosis not present

## 2018-01-30 NOTE — Patient Instructions (Signed)
Continue your current therapy  I will see you in 6 months.   

## 2018-02-07 DIAGNOSIS — M1712 Unilateral primary osteoarthritis, left knee: Secondary | ICD-10-CM | POA: Diagnosis not present

## 2018-02-07 DIAGNOSIS — M1711 Unilateral primary osteoarthritis, right knee: Secondary | ICD-10-CM | POA: Diagnosis not present

## 2018-02-07 DIAGNOSIS — M25561 Pain in right knee: Secondary | ICD-10-CM | POA: Diagnosis not present

## 2018-02-07 DIAGNOSIS — M170 Bilateral primary osteoarthritis of knee: Secondary | ICD-10-CM | POA: Diagnosis not present

## 2018-02-14 ENCOUNTER — Other Ambulatory Visit (HOSPITAL_COMMUNITY): Payer: Self-pay | Admitting: Internal Medicine

## 2018-02-14 ENCOUNTER — Ambulatory Visit (HOSPITAL_COMMUNITY)
Admission: RE | Admit: 2018-02-14 | Discharge: 2018-02-14 | Disposition: A | Discharge status: Home / Self Care | Payer: Medicare Other | Source: Ambulatory Visit | Attending: Family | Admitting: Family

## 2018-02-14 DIAGNOSIS — I708 Atherosclerosis of other arteries: Secondary | ICD-10-CM | POA: Diagnosis not present

## 2018-02-14 DIAGNOSIS — M79662 Pain in left lower leg: Secondary | ICD-10-CM

## 2018-02-14 DIAGNOSIS — M79605 Pain in left leg: Secondary | ICD-10-CM | POA: Diagnosis present

## 2018-02-14 DIAGNOSIS — R9389 Abnormal findings on diagnostic imaging of other specified body structures: Secondary | ICD-10-CM | POA: Insufficient documentation

## 2018-02-14 DIAGNOSIS — Z87891 Personal history of nicotine dependence: Secondary | ICD-10-CM | POA: Insufficient documentation

## 2018-02-14 DIAGNOSIS — I2510 Atherosclerotic heart disease of native coronary artery without angina pectoris: Secondary | ICD-10-CM | POA: Insufficient documentation

## 2018-02-14 DIAGNOSIS — E785 Hyperlipidemia, unspecified: Secondary | ICD-10-CM | POA: Insufficient documentation

## 2018-02-16 ENCOUNTER — Other Ambulatory Visit: Payer: Self-pay | Admitting: Physician Assistant

## 2018-03-04 DIAGNOSIS — H401111 Primary open-angle glaucoma, right eye, mild stage: Secondary | ICD-10-CM | POA: Diagnosis not present

## 2018-03-04 DIAGNOSIS — H401123 Primary open-angle glaucoma, left eye, severe stage: Secondary | ICD-10-CM | POA: Diagnosis not present

## 2018-03-07 ENCOUNTER — Telehealth: Payer: Self-pay | Admitting: *Deleted

## 2018-03-07 NOTE — Telephone Encounter (Signed)
Spoke to patient to confirm he is aware he is to hold Eliquis 2 days prior to colonoscopy. Colonoscopy scheduled for Monday 03/11/2018.

## 2018-03-11 ENCOUNTER — Encounter: Payer: Self-pay | Admitting: Internal Medicine

## 2018-03-11 ENCOUNTER — Ambulatory Visit (AMBULATORY_SURGERY_CENTER): Payer: Medicare Other | Admitting: Internal Medicine

## 2018-03-11 VITALS — BP 106/71 | HR 86 | Temp 98.0°F | Resp 16 | Ht 71.0 in | Wt 175.0 lb

## 2018-03-11 DIAGNOSIS — Z85038 Personal history of other malignant neoplasm of large intestine: Secondary | ICD-10-CM

## 2018-03-11 DIAGNOSIS — D124 Benign neoplasm of descending colon: Secondary | ICD-10-CM

## 2018-03-11 DIAGNOSIS — Z1211 Encounter for screening for malignant neoplasm of colon: Secondary | ICD-10-CM | POA: Diagnosis not present

## 2018-03-11 MED ORDER — SODIUM CHLORIDE 0.9 % IV SOLN: 500.0000 mL | Freq: Once | INTRAVENOUS | Status: DC

## 2018-03-11 NOTE — Progress Notes (Signed)
A and O x3. Report to RN. Tolerated MAC anesthesia well.

## 2018-03-11 NOTE — Progress Notes (Signed)
Pt's states no medical or surgical changes since previsit or office visit. 

## 2018-03-11 NOTE — Patient Instructions (Signed)
Handouts:  Polyps and Diverticulosis   YOU HAD AN ENDOSCOPIC PROCEDURE TODAY AT THE [facility]:   Refer to the procedure report that was given to you for any specific questions about what was found during the examination.  If the procedure report does not answer your questions, please call your gastroenterologist to clarify.  If you requested that your care partner not be given the details of your procedure findings, then the procedure report has been included in a sealed envelope for you to review at your convenience later.  YOU SHOULD EXPECT: Some feelings of bloating in the abdomen. Passage of more gas than usual.  Walking can help get rid of the air that was put into your GI tract during the procedure and reduce the bloating. If you had a lower endoscopy (such as a colonoscopy or flexible sigmoidoscopy) you may notice spotting of blood in your stool or on the toilet paper. If you underwent a bowel prep for your procedure, you may not have a normal bowel movement for a few days.  Please Note:  You might notice some irritation and congestion in your nose or some drainage.  This is from the oxygen used during your procedure.  There is no need for concern and it should clear up in a day or so.  SYMPTOMS TO REPORT IMMEDIATELY:   Following lower endoscopy (colonoscopy or flexible sigmoidoscopy):  Excessive amounts of blood in the stool  Significant tenderness or worsening of abdominal pains  Swelling of the abdomen that is new, acute  Fever of 100F or higher  For urgent or emergent issues, a gastroenterologist can be reached at any hour by calling [phone].   DIET:  We do recommend a small meal at first, but then you may proceed to your regular diet.  Drink plenty of fluids but you should avoid alcoholic beverages for 24 hours.  ACTIVITY:  You should plan to take it easy for the rest of today and you should NOT DRIVE or use heavy machinery until tomorrow (because of the  sedation medicines used during the test).    FOLLOW UP: Our staff will call the number listed on your records the next business day following your procedure to check on you and address any questions or concerns that you may have regarding the information given to you following your procedure. If we do not reach you, we will leave a message.  However, if you are feeling well and you are not experiencing any problems, there is no need to return our call.  We will assume that you have returned to your regular daily activities without incident.  If any biopsies were taken you will be contacted by phone or by letter within the next 1-3 weeks.  Please call us at [phone] if you have not heard about the biopsies in 3 weeks.    SIGNATURES/CONFIDENTIALITY: You and/or your care partner have signed paperwork which will be entered into your electronic medical record.  These signatures attest to the fact that that the information above on your After Visit Summary has been reviewed and is understood.  Full responsibility of the confidentiality of this discharge information lies with you and/or your care-partner. 

## 2018-03-11 NOTE — Progress Notes (Signed)
Called to room to assist during endoscopic procedure.  Patient ID and intended procedure confirmed with present staff. Received instructions for my participation in the procedure from the performing physician.  

## 2018-03-11 NOTE — Op Note (Signed)
[place] Patient Name: Jason [name] Procedure Date: 03/11/2018 10:56 AM MRN: [id number] Endoscopist: Docia Chuck. Henrene Pastor , MD Age: 84 Referring MD:  Date of Birth: [birth year] Gender: Male Account #: [id number] Procedure:                Colonoscopy, with cold snare polypectomy x 1 Indications:              High risk colon cancer surveillance: Personal                            history of colon cancer. Diagnosis January 2018                            status post right hemicolectomy Medicines:                Monitored Anesthesia Care Procedure:                Pre-Anesthesia Assessment:                           - Prior to the procedure, a History and Physical                            was performed, and patient medications and                            allergies were reviewed. The patient's tolerance of                            previous anesthesia was also reviewed. The risks                            and benefits of the procedure and the sedation                            options and risks were discussed with the patient.                            All questions were answered, and informed consent                            was obtained. Prior Anticoagulants: The patient has                            taken Eliquis (apixaban), last dose was 3 days                            prior to procedure. ASA Grade Assessment: III - A                            patient with severe systemic disease. After                            reviewing the risks and benefits, the patient was  deemed in satisfactory condition to undergo the                            procedure.                           After obtaining informed consent, the colonoscope                            was passed under direct vision. Throughout the                            procedure, the patient's blood pressure, pulse, and                            oxygen saturations were monitored  continuously. The                            Colonoscope was introduced through the anus and                            advanced to the the ileocolonic anastomosis. The                            terminal ileum and the rectum were photographed.                            The quality of the bowel preparation was excellent.                            The colonoscopy was performed without difficulty.                            The patient tolerated the procedure well. The bowel                            preparation used was SUPREP. Scope In: 11:05:50 AM Scope Out: 11:17:14 AM Scope Withdrawal Time: 0 hours 8 minutes 53 seconds  Total Procedure Duration: 0 hours 11 minutes 24 seconds  Findings:                 A 3 mm polyp was found in the descending colon. The                            polyp was sessile. The polyp was removed with a                            cold snare. Resection and retrieval were complete.                           Multiple small and large-mouthed diverticula were                            found in the transverse colon and left colon.  The exam was otherwise without abnormality on                            direct and retroflexion views. Complications:            No immediate complications. Estimated blood loss:                            None. Estimated Blood Loss:     Estimated blood loss: none. Impression:               - One 3 mm polyp in the descending colon, removed                            with a cold snare. Resected and retrieved.                           - Diverticulosis in the transverse colon and in the                            left colon.                           - The examination was otherwise normal on direct                            and retroflexion views. Recommendation:           - Repeat colonoscopy is not recommended for                            surveillance.                           - Resume Eliquis (apixaban)  today at prior dose.                           - Patient has a contact number available for                            emergencies. The signs and symptoms of potential                            delayed complications were discussed with the                            patient. Return to normal activities tomorrow.                            Written discharge instructions were provided to the                            patient.                           - Resume previous diet.                           -  Continue present medications.                           - Await pathology results. Docia Chuck. Henrene Pastor, MD 03/11/2018 11:22:33 AM This report has been signed electronically.

## 2018-03-12 ENCOUNTER — Telehealth: Payer: Self-pay

## 2018-03-12 DIAGNOSIS — H353222 Exudative age-related macular degeneration, left eye, with inactive choroidal neovascularization: Secondary | ICD-10-CM | POA: Diagnosis not present

## 2018-03-12 DIAGNOSIS — H353134 Nonexudative age-related macular degeneration, bilateral, advanced atrophic with subfoveal involvement: Secondary | ICD-10-CM | POA: Diagnosis not present

## 2018-03-12 DIAGNOSIS — H401132 Primary open-angle glaucoma, bilateral, moderate stage: Secondary | ICD-10-CM | POA: Diagnosis not present

## 2018-03-12 DIAGNOSIS — H353212 Exudative age-related macular degeneration, right eye, with inactive choroidal neovascularization: Secondary | ICD-10-CM | POA: Diagnosis not present

## 2018-03-12 NOTE — Telephone Encounter (Signed)
  Follow up Call-  Call back number 03/11/2018 09/06/2016  Post procedure Call Back phone  # [phone]  Permission to leave phone message Yes Yes  Some recent data might be hidden     Patient questions:  Do you have a fever, pain , or abdominal swelling? No. Pain Score  0 *  Have you tolerated food without any problems? Yes.    Have you been able to return to your normal activities? Yes.    Do you have any questions about your discharge instructions: Diet   No. Medications  No. Follow up visit  No.  Do you have questions or concerns about your Care? No.  Actions: * If pain score is 4 or above: No action needed, pain <4.

## 2018-03-18 ENCOUNTER — Encounter: Payer: Self-pay | Admitting: Internal Medicine

## 2018-04-05 DIAGNOSIS — C61 Malignant neoplasm of prostate: Secondary | ICD-10-CM | POA: Diagnosis not present

## 2018-04-11 ENCOUNTER — Other Ambulatory Visit (HOSPITAL_COMMUNITY): Payer: Self-pay | Admitting: Urology

## 2018-04-11 DIAGNOSIS — C180 Malignant neoplasm of cecum: Secondary | ICD-10-CM | POA: Diagnosis not present

## 2018-04-11 DIAGNOSIS — C61 Malignant neoplasm of prostate: Secondary | ICD-10-CM

## 2018-04-11 DIAGNOSIS — R351 Nocturia: Secondary | ICD-10-CM | POA: Diagnosis not present

## 2018-04-24 ENCOUNTER — Encounter (HOSPITAL_COMMUNITY)
Admission: RE | Admit: 2018-04-24 | Discharge: 2018-04-24 | Disposition: A | Discharge status: Home / Self Care | Payer: Medicare Other | Source: Ambulatory Visit | Attending: Urology | Admitting: Urology

## 2018-04-24 DIAGNOSIS — C61 Malignant neoplasm of prostate: Secondary | ICD-10-CM | POA: Insufficient documentation

## 2018-04-24 MED ORDER — TECHNETIUM TC 99M MEDRONATE IV KIT
21.9000 | PACK | Freq: Once | INTRAVENOUS | Status: AC | PRN
  Administered 2018-04-24: 21.9 via INTRAVENOUS

## 2018-05-08 DIAGNOSIS — Z9989 Dependence on other enabling machines and devices: Secondary | ICD-10-CM | POA: Diagnosis not present

## 2018-05-08 DIAGNOSIS — C61 Malignant neoplasm of prostate: Secondary | ICD-10-CM | POA: Diagnosis not present

## 2018-05-08 DIAGNOSIS — Z23 Encounter for immunization: Secondary | ICD-10-CM | POA: Diagnosis not present

## 2018-05-08 DIAGNOSIS — M79662 Pain in left lower leg: Secondary | ICD-10-CM | POA: Diagnosis not present

## 2018-05-08 DIAGNOSIS — C180 Malignant neoplasm of cecum: Secondary | ICD-10-CM | POA: Diagnosis not present

## 2018-05-08 DIAGNOSIS — R7301 Impaired fasting glucose: Secondary | ICD-10-CM | POA: Diagnosis not present

## 2018-05-08 DIAGNOSIS — I480 Paroxysmal atrial fibrillation: Secondary | ICD-10-CM | POA: Diagnosis not present

## 2018-05-08 DIAGNOSIS — Z6825 Body mass index (BMI) 25.0-25.9, adult: Secondary | ICD-10-CM | POA: Diagnosis not present

## 2018-05-08 DIAGNOSIS — M5136 Other intervertebral disc degeneration, lumbar region: Secondary | ICD-10-CM | POA: Diagnosis not present

## 2018-05-10 ENCOUNTER — Other Ambulatory Visit: Payer: Self-pay | Admitting: Internal Medicine

## 2018-05-10 DIAGNOSIS — M79662 Pain in left lower leg: Secondary | ICD-10-CM

## 2018-05-10 DIAGNOSIS — M5136 Other intervertebral disc degeneration, lumbar region: Secondary | ICD-10-CM

## 2018-05-13 ENCOUNTER — Encounter: Payer: Self-pay | Admitting: Nurse Practitioner

## 2018-05-13 ENCOUNTER — Inpatient Hospital Stay: Payer: Medicare Other

## 2018-05-13 ENCOUNTER — Inpatient Hospital Stay: Payer: Medicare Other | Attending: Nurse Practitioner | Admitting: Nurse Practitioner

## 2018-05-13 ENCOUNTER — Telehealth: Payer: Self-pay | Admitting: Oncology

## 2018-05-13 VITALS — BP 122/74 | HR 80 | Temp 99.6°F | Resp 18 | Ht 71.0 in | Wt 177.3 lb

## 2018-05-13 DIAGNOSIS — Z7901 Long term (current) use of anticoagulants: Secondary | ICD-10-CM | POA: Diagnosis not present

## 2018-05-13 DIAGNOSIS — Z951 Presence of aortocoronary bypass graft: Secondary | ICD-10-CM | POA: Diagnosis not present

## 2018-05-13 DIAGNOSIS — Z8546 Personal history of malignant neoplasm of prostate: Secondary | ICD-10-CM | POA: Insufficient documentation

## 2018-05-13 DIAGNOSIS — Z79899 Other long term (current) drug therapy: Secondary | ICD-10-CM | POA: Insufficient documentation

## 2018-05-13 DIAGNOSIS — C180 Malignant neoplasm of cecum: Secondary | ICD-10-CM | POA: Diagnosis not present

## 2018-05-13 DIAGNOSIS — I2510 Atherosclerotic heart disease of native coronary artery without angina pectoris: Secondary | ICD-10-CM | POA: Insufficient documentation

## 2018-05-13 DIAGNOSIS — C779 Secondary and unspecified malignant neoplasm of lymph node, unspecified: Secondary | ICD-10-CM | POA: Diagnosis not present

## 2018-05-13 DIAGNOSIS — H409 Unspecified glaucoma: Secondary | ICD-10-CM | POA: Diagnosis not present

## 2018-05-13 DIAGNOSIS — Z952 Presence of prosthetic heart valve: Secondary | ICD-10-CM | POA: Diagnosis not present

## 2018-05-13 DIAGNOSIS — R970 Elevated carcinoembryonic antigen [CEA]: Secondary | ICD-10-CM | POA: Insufficient documentation

## 2018-05-13 LAB — CEA (IN HOUSE-CHCC): CEA (CHCC-In House): 5.22 ng/mL — ABNORMAL HIGH (ref 0.00–5.00)

## 2018-05-13 NOTE — Telephone Encounter (Signed)
Apps scheduled AVS/calendar printed per 9/16 los

## 2018-05-13 NOTE — Progress Notes (Signed)
  [facility] OFFICE PROGRESS NOTE   Diagnosis: Colon cancer  INTERVAL HISTORY:   Jason [name] returns as scheduled.  He overall feels well.  No change in bowel habits.  He reports undergoing a colonoscopy over the summer with a polyp removed.  No rectal bleeding.  He denies abdominal pain.  He has a good appetite.  He continues follow-up with urology regarding the elevated PSA.  Objective:  Vital signs in last 24 hours:  Blood pressure 122/74, pulse 80, temperature 99.6 F (37.6 C), temperature source Oral, resp. rate 18, height '5\' 11"'$  (1.803 m), weight 177 lb 4.8 oz (80.4 kg), SpO2 95 %.    HEENT: Neck without mass. Lymphatics: No palpable cervical, supraclavicular, axillary or inguinal lymph nodes. Resp: Lungs clear bilaterally. Cardio: Regular rate and rhythm. GI: Abdomen soft and nontender.  No hepatomegaly. Vascular: No leg edema.    Lab Results:  Lab Results  Component Value Date   WBC 5.0 05/09/2017   HGB 13.7 05/09/2017   HCT 40.7 05/09/2017   MCV 109.4 (H) 05/09/2017   PLT 143 05/09/2017   NEUTROABS 2.8 05/09/2017    Imaging:  No results found.  Medications: I have reviewed the patient's current medications.  Assessment/Plan: 1. Colon cancer, cecum, stage IIIB (T4a,N1b), status post a right colectomy 10/24/2016  3/23 lymph nodes positive for metastatic adenocarcinoma  MSI-stable, no loss of mismatch repair protein expression  Cycle 1 adjuvant Xeloda 11/20/2016  Cycle 2 adjuvant Xeloda 12/12/2016 (dose reduced due to mucositis following cycle 1)  Cycle 3 adjuvant Xeloda 01/02/2017  Cycle 4 adjuvant Xeloda 01/23/2017  Cycle 5 adjuvant Xeloda 02/13/2017  Cycle 6 adjuvant Xeloda 03/06/2017  Cycle 7 adjuvant Xeloda 03/27/2017  Cycle 8 adjuvant Xeloda 04/17/2017  Colonoscopy 715 2019-3 mm polyp in the descending colon (tubular adenoma, no high-grade dysplasia)  2. History of iron deficiency anemia secondary to #1, status post  IV iron therapy and multiple red cell transfusions; hemoglobin improved 11/16/2016;hemoglobin normal range 01/17/2017  3. Prostate cancer followed by urology with observation  Bone scan 04/24/2018- no change from comparison exam 10/19/2017.  No evidence of skeletal metastasis.  4. History of coronary artery disease, status post coronary artery bypass surgery 2011  5. Aortic valve replacement 2011  6. Atrial fibrillation-previously maintained on Coumadin;switched to Eliquisdue to known interaction between Coumadin and Xeloda  7. Glaucoma  8. Mucositis secondary to Xeloda following cycle 1; Xeloda dose reduced beginning with cycle 2.  Disposition: Jason [name] remains in clinical remission from colon cancer.  We will follow-up on the CEA from today.  He will return for a CEA and follow-up visit in 6 months.  He continues follow-up with urology regarding the prostate cancer.  Plan reviewed with Jason [name].    Jason [name] ANP/GNP-BC   05/13/2018  10:50 AM

## 2018-05-14 ENCOUNTER — Telehealth: Payer: Self-pay | Admitting: Emergency Medicine

## 2018-05-14 NOTE — Telephone Encounter (Addendum)
VM left for pt to call back regarding this note. Will follow up   ----- Message from Owens Shark, NP sent at 05/14/2018  8:36 AM EDT ----- Please call him-- the CEA is just above normal range, not significantly changed over the past year; please schedule repeat CEA in 3 months; let him know the plan is for CTs if there is a consistent rise in the CEA.

## 2018-05-15 ENCOUNTER — Telehealth: Payer: Self-pay | Admitting: Emergency Medicine

## 2018-05-15 NOTE — Telephone Encounter (Signed)
Scheduling message sent for labs in 3 months

## 2018-05-15 NOTE — Telephone Encounter (Addendum)
Pt wife verbalized understanding of this. Scheduling message sent for labs on 12/16 @ 9am.   ----- Message from Owens Shark, NP sent at 05/14/2018  8:36 AM EDT ----- Please call him-- the CEA is just above normal range, not significantly changed over the past year; please schedule repeat CEA in 3 months; let him know the plan is for CTs if there is a consistent rise in the CEA.

## 2018-05-18 ENCOUNTER — Ambulatory Visit
Admission: RE | Admit: 2018-05-18 | Discharge: 2018-05-18 | Disposition: A | Discharge status: Home / Self Care | Payer: Medicare Other | Source: Ambulatory Visit | Attending: Internal Medicine | Admitting: Internal Medicine

## 2018-05-18 DIAGNOSIS — M48061 Spinal stenosis, lumbar region without neurogenic claudication: Secondary | ICD-10-CM | POA: Diagnosis not present

## 2018-05-18 DIAGNOSIS — M5136 Other intervertebral disc degeneration, lumbar region: Secondary | ICD-10-CM

## 2018-05-18 DIAGNOSIS — M79662 Pain in left lower leg: Secondary | ICD-10-CM

## 2018-05-29 ENCOUNTER — Other Ambulatory Visit: Payer: Self-pay | Admitting: Internal Medicine

## 2018-05-29 DIAGNOSIS — M5136 Other intervertebral disc degeneration, lumbar region: Secondary | ICD-10-CM

## 2018-06-11 DIAGNOSIS — M1711 Unilateral primary osteoarthritis, right knee: Secondary | ICD-10-CM | POA: Diagnosis not present

## 2018-06-12 ENCOUNTER — Ambulatory Visit
Admission: RE | Admit: 2018-06-12 | Discharge: 2018-06-12 | Disposition: A | Discharge status: Home / Self Care | Payer: Medicare Other | Source: Ambulatory Visit | Attending: Internal Medicine | Admitting: Internal Medicine

## 2018-06-12 ENCOUNTER — Other Ambulatory Visit: Payer: Self-pay | Admitting: Internal Medicine

## 2018-06-12 DIAGNOSIS — M5136 Other intervertebral disc degeneration, lumbar region: Secondary | ICD-10-CM

## 2018-06-12 DIAGNOSIS — M48061 Spinal stenosis, lumbar region without neurogenic claudication: Secondary | ICD-10-CM | POA: Diagnosis not present

## 2018-06-12 DIAGNOSIS — M51369 Other intervertebral disc degeneration, lumbar region without mention of lumbar back pain or lower extremity pain: Secondary | ICD-10-CM

## 2018-06-12 MED ORDER — IOPAMIDOL (ISOVUE-M 200) INJECTION 41%
1.0000 mL | Freq: Once | INTRAMUSCULAR | Status: AC
  Administered 2018-06-12: 1 mL via EPIDURAL

## 2018-06-12 MED ORDER — METHYLPREDNISOLONE ACETATE 40 MG/ML INJ SUSP (RADIOLOG
120.0000 mg | Freq: Once | INTRAMUSCULAR | Status: AC
  Administered 2018-06-12: 120 mg via EPIDURAL

## 2018-06-12 NOTE — Discharge Instructions (Signed)

## 2018-06-18 DIAGNOSIS — H353222 Exudative age-related macular degeneration, left eye, with inactive choroidal neovascularization: Secondary | ICD-10-CM | POA: Diagnosis not present

## 2018-06-18 DIAGNOSIS — H353212 Exudative age-related macular degeneration, right eye, with inactive choroidal neovascularization: Secondary | ICD-10-CM | POA: Diagnosis not present

## 2018-06-18 DIAGNOSIS — H353134 Nonexudative age-related macular degeneration, bilateral, advanced atrophic with subfoveal involvement: Secondary | ICD-10-CM | POA: Diagnosis not present

## 2018-06-18 DIAGNOSIS — M1711 Unilateral primary osteoarthritis, right knee: Secondary | ICD-10-CM | POA: Diagnosis not present

## 2018-06-18 DIAGNOSIS — H4720 Unspecified optic atrophy: Secondary | ICD-10-CM | POA: Diagnosis not present

## 2018-06-25 DIAGNOSIS — M1711 Unilateral primary osteoarthritis, right knee: Secondary | ICD-10-CM | POA: Diagnosis not present

## 2018-07-19 DIAGNOSIS — G629 Polyneuropathy, unspecified: Secondary | ICD-10-CM | POA: Diagnosis not present

## 2018-07-19 DIAGNOSIS — I10 Essential (primary) hypertension: Secondary | ICD-10-CM | POA: Diagnosis not present

## 2018-07-19 DIAGNOSIS — Z6825 Body mass index (BMI) 25.0-25.9, adult: Secondary | ICD-10-CM | POA: Diagnosis not present

## 2018-07-19 DIAGNOSIS — M5126 Other intervertebral disc displacement, lumbar region: Secondary | ICD-10-CM | POA: Diagnosis not present

## 2018-07-24 DIAGNOSIS — H401123 Primary open-angle glaucoma, left eye, severe stage: Secondary | ICD-10-CM | POA: Diagnosis not present

## 2018-07-24 DIAGNOSIS — H401111 Primary open-angle glaucoma, right eye, mild stage: Secondary | ICD-10-CM | POA: Diagnosis not present

## 2018-07-29 ENCOUNTER — Telehealth: Payer: Self-pay

## 2018-07-29 NOTE — Telephone Encounter (Signed)
   [facility] Pre-operative Risk Assessment    Request for surgical clearance:  1. What type of surgery is being performed?  Spinal Injection   2. When is this surgery scheduled? TBD   3. What type of clearance is required (medical clearance vs. Pharmacy clearance to hold med vs. Both)? Pharmacy  4. Are there any medications that need to be held prior to surgery and how long? Eliquis 3-5 days   5. Practice name and name of physician performing surgery? [facility] ( Dr. Marlaine Hind)   6. What is your office phone number [phone]    7.   What is your office fax number336 [phone]  8.   Anesthesia type (None, local, MAC, general) ? Unknown   Meryl Crutch 07/29/2018, 3:42 PM  _________________________________________________________________   (provider comments below)

## 2018-07-30 DIAGNOSIS — M5416 Radiculopathy, lumbar region: Secondary | ICD-10-CM | POA: Diagnosis not present

## 2018-07-30 DIAGNOSIS — I10 Essential (primary) hypertension: Secondary | ICD-10-CM | POA: Diagnosis not present

## 2018-07-30 DIAGNOSIS — M5126 Other intervertebral disc displacement, lumbar region: Secondary | ICD-10-CM | POA: Diagnosis not present

## 2018-07-30 NOTE — Telephone Encounter (Signed)
Patient has appointment later this month with Dr. Martinique - clearance can be addressed at this time.   Burtis Junes, RN, [place] [address] [place], [place]  [phone]

## 2018-07-30 NOTE — Telephone Encounter (Signed)
Called pt re: surgical clearance. (see message below) Spoke with Izora Gala, DPR on file.  She has been made aware that surgical clearance will be addressed at pts appt 08/07/18.  She thanked me for the call.

## 2018-08-06 DIAGNOSIS — M1711 Unilateral primary osteoarthritis, right knee: Secondary | ICD-10-CM | POA: Diagnosis not present

## 2018-08-06 NOTE — Progress Notes (Signed)
Jason [name] Date of Birth: 1926/01/05   History of Present Illness: Jason [name] is seen today for follow up of atrial fibrillation/ CAD/ and AVR. He is status post tissue aortic valve replacement and coronary bypass surgery in October of 2011. He has a tissue valve prosthesis. He had a LIMA to the LAD. The RCA had 40-50% stenosis treated medically.  He has chronic atrial fibrillation. He has been treated with rate control and anticoagulation with Xarelto. In January 2016 he was admitted with a GIB. Xarelto was held. Hbg dropped to 9.7. he did not require transfusion. 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 After that he was  on coumadin.  In May 2017 his Hgb dropped. He was transfused.  No obvious bleeding.  He was started on iron infusions.   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 He was discharged on 10/27/2016, however returned the same afternoon with complaint of GI bleed. He was seen by Dr. Curt Bears on 10/29/2016, his beta blocker was increased to help rate control with his permanent atrial fibrillation. There was some wide complex tachycardia c/w aberrancy versus VT.  He was started on Xeloda and due to interaction with Coumadin he was switched to Eliquis. Hgb has been stable. Repeat colonoscopy in July showed a single polyp that was removed.   On follow up today he is doing well. He notes he needs to have an epidural spine injection by Dr. Brien Few and needs clearance. He denies any bleeding, palpitations, dizziness, SOB. He rarely gets any chest pain and usually this is when he has indigestion.  Current Outpatient Medications on File Prior to Visit  Medication Sig Dispense Refill  . acetaminophen (TYLENOL) 500 MG tablet Take 500 mg by mouth every 6 (six) hours as needed for moderate pain.     [name] apixaban (ELIQUIS) 5 MG TABS tablet Take 5 mg by mouth 2 (two) times daily.    . brimonidine (ALPHAGAN) 0.2 % ophthalmic  solution Place 1 drop into the left eye 2 (two) times daily.  11  . Calcium Carbonate-Vitamin D (CALCIUM-VITAMIN D) 500-200 MG-UNIT per tablet Take 1 tablet by mouth daily.    . Cholecalciferol 1000 UNITS capsule Take 1,000 Units by mouth daily.     . dorzolamide (TRUSOPT) 2 % ophthalmic solution PLACE 1 DROP INTO BOTH EYES 3 TIMES DAILY.  11  . fluticasone (FLONASE) 50 MCG/ACT nasal spray Place 1 spray into both nostrils daily as needed for allergies or rhinitis.     [name] glucosamine-chondroitin 500-400 MG tablet Take 1 tablet by mouth 2 (two) times daily.    [name] latanoprost (XALATAN) 0.005 % ophthalmic solution Place 1 drop into both eyes daily.  11  . loperamide (IMODIUM) 2 MG capsule Take 2 mg by mouth as needed for diarrhea or loose stools.    . metoprolol tartrate (LOPRESSOR) 25 MG tablet TAKE 1 TABLET BY MOUTH TWICE A DAY 180 tablet 3  . Multiple Vitamin (MULTIVITAMIN WITH MINERALS) TABS tablet Take 1 tablet by mouth daily.    [name] omega-3 acid ethyl esters (LOVAZA) 1 G capsule Take 1 g by mouth daily.     [name] perphenazine-amitriptyline (ETRAFON/TRIAVIL) 4-25 MG TABS Take 1 tablet by mouth at bedtime.    . prednisoLONE acetate (PRED FORTE) 1 % ophthalmic suspension Place 1 drop into the right eye every other day.     . prochlorperazine (COMPAZINE) 5 MG tablet Take 1 tablet (5  mg total) by mouth every 6 (six) hours as needed for nausea or vomiting. 30 tablet 0  . psyllium (METAMUCIL) 58.6 % powder Take 1 packet by mouth daily.    . ranitidine (ZANTAC) 150 MG tablet Take 150 mg by mouth daily.    . rosuvastatin (CRESTOR) 5 MG tablet Take 5 mg by mouth every other day.     . vitamin B-12 (CYANOCOBALAMIN) 1000 MCG tablet Take 1,000 mcg by mouth daily.     Current Facility-Administered Medications on File Prior to Visit  Medication Dose Route Frequency Provider Last Rate Last Dose  . 0.9 %  sodium chloride infusion  500 mL Intravenous Once [name], MD        No Known Allergies  Past Medical  History:  Diagnosis Date  . Abnormal PSA   . Adenomatous polyp of colon 2010 and before 2006  . Anemia   . Aortic stenosis   . Aortic valve prosthesis present   . Atrial fibrillation ([place])   . CAD (coronary artery disease)   . DDD (degenerative disc disease), lumbar   . Depression   . Diverticulosis   . Dyslipidemia   . GERD (gastroesophageal reflux disease)   . Glaucoma   . History of blood transfusion 09/29/2016  . Hx of CABG   . Hypogonadism, male   . Macular degeneration   . OSA (obstructive sleep apnea)   . Osteoarthritis   . Osteopenia   . Prostate CA [facility]) prostate 2007   colon dx 2018, watching psa levels, no tx yet for prostate    Past Surgical History:  Procedure Laterality Date  . AORTIC VALVE REPLACEMENT  October 2011   Magna Ease pericardial tissue valve #65mm  . [place]   lower  . CATARACT EXTRACTION Bilateral   . CORNEAL TRANSPLANT Right   . CORONARY ARTERY BYPASS GRAFT  October 2011   LIMA to LAD  . ESOPHAGOGASTRODUODENOSCOPY N/A 09/16/2014   Procedure: ESOPHAGOGASTRODUODENOSCOPY (EGD);  Surgeon: [name], MD;  Location: [facility] ENDOSCOPY;  Service: Endoscopy;  Laterality: N/A;  . TONSILLECTOMY  76 yrs ago    Social History   Tobacco Use  Smoking Status Former Smoker  . Packs/day: 1.00  . Years: 40.00  . Pack years: 40.00  . Types: Cigarettes  . Last attempt to quit: 11/16/1990  . Years since quitting: 27.7  Smokeless Tobacco Never Used    Social History   Substance and Sexual Activity  Alcohol Use Yes  . Alcohol/week: 2.0 standard drinks  . Types: 2 Glasses of wine per week   Comment: wine few x per week    Family History  Problem Relation Age of Onset  . Heart attack Father   . Colon cancer Neg Hx   . Esophageal cancer Neg Hx   . Stomach cancer Neg Hx   . Rectal cancer Neg Hx     Review of Systems: As noted in history of present illness. s All other systems were reviewed and are negative.  Physical Exam: BP 129/66    Pulse 63   Ht 5\' 11"  (1.803 m)   Wt 174 lb 6.4 oz (79.1 kg)   SpO2 98%   BMI 24.32 kg/m  GENERAL:  Well appearing, elderly WM in NAD HEENT:  PERRL, EOMI, sclera are clear. Oropharynx is clear. NECK:  No jugular venous distention, carotid upstroke brisk and symmetric, no bruits, no thyromegaly or adenopathy LUNGS:  Clear to auscultation bilaterally CHEST:  Unremarkable HEART:  IRRR,  PMI not displaced or sustained,S1 and S2 within normal limits, no S3, no S4: no clicks, no rubs, gr 1/6 SEM RUSB. ABD:  Soft, nontender. BS +, no masses or bruits. No hepatomegaly, no splenomegaly EXT:  2 + pulses throughout, no edema, no cyanosis no clubbing SKIN:  Warm and dry.  No rashes NEURO:  Alert and oriented x 3. Cranial nerves II through XII intact. PSYCH:  Cognitively intact      LABORATORY DATA:    Labs dated 07/02/15: cholesterol 97, triglycerides 176, LDL 30, HDL 32.  Dated 01/12/16: A1c 4.7% Dated 08/10/16; BUN 15.5, creatinine 1.1. Other CMET normal. Dated 02/06/17: normal CMET. Dated 10/30/17: cholesterol 115, triglycerides 174, HDL 37, LDL 43. A1c 5.4%. Creatinine 1.2. Other chemistries, CBC, and TSH normal. Dated 04/11/18: creatinine 1.1.    Assessment / Plan: 1. Status post tissue aortic valve replacement for severe aortic stenosis in October 2011. Clinically doing well. Good valve function by Echo November 2015. Exam is stable.   2. Coronary disease status post CABG with an LIMA graft to the LAD in October of 2011. He has no significant angina.  3. Dyslipidemia excellent control on Crestor and fish oil.   4. PVCs. These are chronic. Asymptomatic.  5. Atrial fibrillation. Chronic. Asymptomatic. Rate controlled on metoprolol. He has a [place] vasc score of 4 placing him at higher risk of CVA. History of GIB on Xarelto. Now on Eliquis due to interaction of Coumadin with Xeloda. Hgb has been stable.    6. GIB. Related to gastric AVM in the past. More recently dx with cecal CA. Stable  Hgb.   7. Cecal CA. S/p surgery and now on Xeloda. Follow up colonoscopy in July showed a single polyp.  8. Need for epidural injection. He is clear from my standpoint and can hold his Eliquis for 48-72 hours for this.  I will see in 6 months.

## 2018-08-07 ENCOUNTER — Telehealth: Payer: Self-pay

## 2018-08-07 ENCOUNTER — Encounter: Payer: Self-pay | Admitting: Cardiology

## 2018-08-07 ENCOUNTER — Ambulatory Visit (INDEPENDENT_AMBULATORY_CARE_PROVIDER_SITE_OTHER): Payer: Medicare Other | Admitting: Cardiology

## 2018-08-07 VITALS — BP 129/66 | HR 63 | Ht 71.0 in | Wt 174.4 lb

## 2018-08-07 DIAGNOSIS — I4821 Permanent atrial fibrillation: Secondary | ICD-10-CM | POA: Diagnosis not present

## 2018-08-07 DIAGNOSIS — I10 Essential (primary) hypertension: Secondary | ICD-10-CM

## 2018-08-07 DIAGNOSIS — Z952 Presence of prosthetic heart valve: Secondary | ICD-10-CM | POA: Diagnosis not present

## 2018-08-07 DIAGNOSIS — I359 Nonrheumatic aortic valve disorder, unspecified: Secondary | ICD-10-CM

## 2018-08-07 DIAGNOSIS — I25810 Atherosclerosis of coronary artery bypass graft(s) without angina pectoris: Secondary | ICD-10-CM | POA: Diagnosis not present

## 2018-08-07 NOTE — Telephone Encounter (Signed)
Dr.Jordan cleared patient for upcoming epidural injection.Ok to hold Eliquis 48 to 72 hours prior to injection.Dr.Jordan's 08/07/18 office note faxed to Dr.Albert Bartko at fax # [phone].

## 2018-08-12 ENCOUNTER — Inpatient Hospital Stay: Payer: Medicare Other | Attending: Nurse Practitioner

## 2018-08-12 DIAGNOSIS — Z8546 Personal history of malignant neoplasm of prostate: Secondary | ICD-10-CM | POA: Insufficient documentation

## 2018-08-12 DIAGNOSIS — C180 Malignant neoplasm of cecum: Secondary | ICD-10-CM | POA: Diagnosis not present

## 2018-08-12 DIAGNOSIS — C779 Secondary and unspecified malignant neoplasm of lymph node, unspecified: Secondary | ICD-10-CM | POA: Insufficient documentation

## 2018-08-12 LAB — CEA (IN HOUSE-CHCC): CEA (CHCC-In House): 4.96 ng/mL (ref 0.00–5.00)

## 2018-08-13 ENCOUNTER — Telehealth: Payer: Self-pay | Admitting: Emergency Medicine

## 2018-08-13 NOTE — Telephone Encounter (Addendum)
Pt wife verbalized understanding  ----- Message from Owens Shark, NP sent at 08/12/2018  4:32 PM EST ----- Please let him know the CEA is normal.  Follow-up as scheduled.

## 2018-08-30 DIAGNOSIS — M5416 Radiculopathy, lumbar region: Secondary | ICD-10-CM | POA: Diagnosis not present

## 2018-09-02 DIAGNOSIS — H401111 Primary open-angle glaucoma, right eye, mild stage: Secondary | ICD-10-CM | POA: Diagnosis not present

## 2018-09-02 DIAGNOSIS — H401123 Primary open-angle glaucoma, left eye, severe stage: Secondary | ICD-10-CM | POA: Diagnosis not present

## 2018-09-10 DIAGNOSIS — Z6825 Body mass index (BMI) 25.0-25.9, adult: Secondary | ICD-10-CM | POA: Diagnosis not present

## 2018-09-10 DIAGNOSIS — M5440 Lumbago with sciatica, unspecified side: Secondary | ICD-10-CM | POA: Diagnosis not present

## 2018-09-11 ENCOUNTER — Telehealth: Payer: Self-pay

## 2018-09-11 NOTE — Telephone Encounter (Addendum)
Primary Cardiologist: [facility] Pre-operative Risk Assessment    Request for surgical clearance:  1. What type of surgery is being performed? Spinal injection   2. When is this surgery scheduled? TBD   3. What type of clearance is required (medical clearance vs. Pharmacy clearance to hold med vs. Both)? Pharmacy  4. Are there any medications that need to be held prior to surgery and how long? Eliquis/ 3 dyas   5. Practice name and name of physician performing surgery?  [facility]/ Dr.Bartko   6. What is your office phone number [phone]    7.   What is your office fax number [phone]  8.   Anesthesia type (None, local, MAC, general) ?  Not listed   Jason [name] 09/11/2018, 12:09 PM  _________________________________________________________________   (provider comments below)

## 2018-09-17 NOTE — Telephone Encounter (Signed)
Patient with diagnosis of Afib on Eliquis for anticoagulation.    Procedure: spinal injection Date of procedure: TBD  CHADS2-VASc score of  4 (CHF, HTN, AGE, DM2, stroke/tia x 2, CAD, AGE, male)  CrCl 37ml/min  Per office protocol, patient can hold Eliquis for 3 days prior to procedure.

## 2018-09-17 NOTE — Telephone Encounter (Signed)
OK to hold Eliquis 3 days prior to spinal injection.  Kerin Ransom PA-C 09/17/2018 4:34 PM

## 2018-09-25 DIAGNOSIS — M5416 Radiculopathy, lumbar region: Secondary | ICD-10-CM | POA: Diagnosis not present

## 2018-09-25 DIAGNOSIS — M5126 Other intervertebral disc displacement, lumbar region: Secondary | ICD-10-CM | POA: Diagnosis not present

## 2018-10-02 DIAGNOSIS — C61 Malignant neoplasm of prostate: Secondary | ICD-10-CM | POA: Diagnosis not present

## 2018-10-08 DIAGNOSIS — M5440 Lumbago with sciatica, unspecified side: Secondary | ICD-10-CM | POA: Diagnosis not present

## 2018-10-08 DIAGNOSIS — Z6825 Body mass index (BMI) 25.0-25.9, adult: Secondary | ICD-10-CM | POA: Diagnosis not present

## 2018-10-10 ENCOUNTER — Other Ambulatory Visit: Payer: Self-pay | Admitting: Urology

## 2018-10-10 ENCOUNTER — Other Ambulatory Visit (HOSPITAL_COMMUNITY): Payer: Self-pay | Admitting: Urology

## 2018-10-10 DIAGNOSIS — C61 Malignant neoplasm of prostate: Secondary | ICD-10-CM | POA: Diagnosis not present

## 2018-10-10 DIAGNOSIS — R3915 Urgency of urination: Secondary | ICD-10-CM | POA: Diagnosis not present

## 2018-10-10 DIAGNOSIS — N401 Enlarged prostate with lower urinary tract symptoms: Secondary | ICD-10-CM | POA: Diagnosis not present

## 2018-10-10 DIAGNOSIS — R972 Elevated prostate specific antigen [PSA]: Secondary | ICD-10-CM | POA: Diagnosis not present

## 2018-10-15 DIAGNOSIS — H353212 Exudative age-related macular degeneration, right eye, with inactive choroidal neovascularization: Secondary | ICD-10-CM | POA: Diagnosis not present

## 2018-10-15 DIAGNOSIS — H4720 Unspecified optic atrophy: Secondary | ICD-10-CM | POA: Diagnosis not present

## 2018-10-15 DIAGNOSIS — H353222 Exudative age-related macular degeneration, left eye, with inactive choroidal neovascularization: Secondary | ICD-10-CM | POA: Diagnosis not present

## 2018-10-15 DIAGNOSIS — H353134 Nonexudative age-related macular degeneration, bilateral, advanced atrophic with subfoveal involvement: Secondary | ICD-10-CM | POA: Diagnosis not present

## 2018-10-30 DIAGNOSIS — Z947 Corneal transplant status: Secondary | ICD-10-CM | POA: Diagnosis not present

## 2018-10-30 DIAGNOSIS — H401123 Primary open-angle glaucoma, left eye, severe stage: Secondary | ICD-10-CM | POA: Diagnosis not present

## 2018-10-30 DIAGNOSIS — H401111 Primary open-angle glaucoma, right eye, mild stage: Secondary | ICD-10-CM | POA: Diagnosis not present

## 2018-10-30 DIAGNOSIS — Z961 Presence of intraocular lens: Secondary | ICD-10-CM | POA: Diagnosis not present

## 2018-11-08 ENCOUNTER — Other Ambulatory Visit: Payer: Self-pay | Admitting: *Deleted

## 2018-11-08 DIAGNOSIS — C180 Malignant neoplasm of cecum: Secondary | ICD-10-CM

## 2018-11-11 ENCOUNTER — Inpatient Hospital Stay: Payer: Medicare Other | Attending: Nurse Practitioner

## 2018-11-11 ENCOUNTER — Telehealth: Payer: Self-pay | Admitting: Oncology

## 2018-11-11 ENCOUNTER — Other Ambulatory Visit: Payer: Self-pay

## 2018-11-11 ENCOUNTER — Inpatient Hospital Stay (HOSPITAL_BASED_OUTPATIENT_CLINIC_OR_DEPARTMENT_OTHER): Payer: Medicare Other | Admitting: Oncology

## 2018-11-11 VITALS — BP 116/74 | HR 68 | Temp 97.8°F | Resp 18 | Ht 71.0 in | Wt 172.1 lb

## 2018-11-11 DIAGNOSIS — C779 Secondary and unspecified malignant neoplasm of lymph node, unspecified: Secondary | ICD-10-CM | POA: Insufficient documentation

## 2018-11-11 DIAGNOSIS — Z8546 Personal history of malignant neoplasm of prostate: Secondary | ICD-10-CM | POA: Diagnosis not present

## 2018-11-11 DIAGNOSIS — I2510 Atherosclerotic heart disease of native coronary artery without angina pectoris: Secondary | ICD-10-CM | POA: Insufficient documentation

## 2018-11-11 DIAGNOSIS — Z952 Presence of prosthetic heart valve: Secondary | ICD-10-CM | POA: Diagnosis not present

## 2018-11-11 DIAGNOSIS — H409 Unspecified glaucoma: Secondary | ICD-10-CM | POA: Insufficient documentation

## 2018-11-11 DIAGNOSIS — Z9221 Personal history of antineoplastic chemotherapy: Secondary | ICD-10-CM | POA: Insufficient documentation

## 2018-11-11 DIAGNOSIS — D509 Iron deficiency anemia, unspecified: Secondary | ICD-10-CM | POA: Insufficient documentation

## 2018-11-11 DIAGNOSIS — C180 Malignant neoplasm of cecum: Secondary | ICD-10-CM

## 2018-11-11 DIAGNOSIS — Z951 Presence of aortocoronary bypass graft: Secondary | ICD-10-CM | POA: Insufficient documentation

## 2018-11-11 DIAGNOSIS — I4891 Unspecified atrial fibrillation: Secondary | ICD-10-CM | POA: Insufficient documentation

## 2018-11-11 DIAGNOSIS — Z7901 Long term (current) use of anticoagulants: Secondary | ICD-10-CM | POA: Diagnosis not present

## 2018-11-11 NOTE — Progress Notes (Signed)
  [facility] OFFICE PROGRESS NOTE   Diagnosis: Colon cancer  INTERVAL HISTORY:   Jason [name] returns as scheduled.  He feels well.  Good appetite and energy level.  No difficulty with bowel function.  No complaint.  Objective:  Vital signs in last 24 hours:  Blood pressure 116/74, pulse 68, temperature 97.8 F (36.6 C), temperature source Oral, resp. rate 18, height '5\' 11"'$  (1.803 m), weight 172 lb 1.6 oz (78.1 kg), SpO2 97 %.    HEENT: Neck without mass Lymphatics: No cervical, supraclavicular, axillary, or inguinal nodes Resp: Lungs clear bilaterally Cardio: Irregular GI: No mass, no hepatosplenomegaly, nontender Vascular: No leg edema   Lab Results:  Lab Results  Component Value Date   WBC 5.0 05/09/2017   HGB 13.7 05/09/2017   HCT 40.7 05/09/2017   MCV 109.4 (H) 05/09/2017   PLT 143 05/09/2017   NEUTROABS 2.8 05/09/2017    CMP  Lab Results  Component Value Date   NA 139 04/11/2017   K 3.8 04/11/2017   CL 104 11/01/2016   CO2 27 04/11/2017   GLUCOSE 97 04/11/2017   BUN 13.7 04/11/2017   CREATININE 1.0 04/11/2017   CALCIUM 9.4 04/11/2017   PROT 7.5 04/11/2017   ALBUMIN 3.9 04/11/2017   AST 22 04/11/2017   ALT 19 04/11/2017   ALKPHOS 54 04/11/2017   BILITOT 0.91 04/11/2017   GFRNONAA >60 11/01/2016   GFRAA >60 11/01/2016    Lab Results  Component Value Date   CEA1 4.96 08/12/2018    Medications: I have reviewed the patient's current medications.   Assessment/Plan: 1. Colon cancer, cecum, stage IIIB (T4a,N1b), status post a right colectomy 10/24/2016  3/23 lymph nodes positive for metastatic adenocarcinoma  MSI-stable, no loss of mismatch repair protein expression  Cycle 1 adjuvant Xeloda 11/20/2016  Cycle 2 adjuvant Xeloda 12/12/2016 (dose reduced due to mucositis following cycle 1)  Cycle 3 adjuvant Xeloda 01/02/2017  Cycle 4 adjuvant Xeloda 01/23/2017  Cycle 5 adjuvant Xeloda 02/13/2017  Cycle 6 adjuvant Xeloda  03/06/2017  Cycle 7 adjuvant Xeloda 03/27/2017  Cycle 8 adjuvant Xeloda 04/17/2017  Colonoscopy on 03/11/2018-3 mm polyp in the descending colon (tubular adenoma, no high-grade dysplasia)  2. History of iron deficiency anemia secondary to #1, status post IV iron therapy and multiple red cell transfusions; hemoglobin improved 11/16/2016;hemoglobin normal range 01/17/2017  3. Prostate cancer followed by urology with observation  Bone scan 04/24/2018- no change from comparison exam 10/19/2017.  No evidence of skeletal metastasis.  4. History of coronary artery disease, status post coronary artery bypass surgery 2011  5. Aortic valve replacement 2011  6. Atrial fibrillation-previously maintained on Coumadin;switched to Eliquisdue to known interaction between Coumadin and Xeloda  7. Glaucoma  8. Mucositis secondary to Xeloda following cycle 1; Xeloda dose reduced beginning with cycle 2.    Disposition: Jason [name] is in clinical remission from colon cancer.  We will follow-up on the CEA from today.  He will return for an office visit and CEA in 6 months.  15 minutes were spent with the patient today.  The majority of the time was used for counseling and coordination of care.  Betsy Coder, MD  11/11/2018  11:30 AM

## 2018-11-11 NOTE — Telephone Encounter (Signed)
Scheduled appt per 3/16 los. ° °Printed calendar and avs. °

## 2018-11-12 LAB — CEA (IN HOUSE-CHCC): CEA (CHCC-IN HOUSE): 6.69 ng/mL — AB (ref 0.00–5.00)

## 2018-11-13 ENCOUNTER — Telehealth: Payer: Self-pay | Admitting: Oncology

## 2018-11-13 ENCOUNTER — Telehealth: Payer: Self-pay | Admitting: *Deleted

## 2018-11-13 DIAGNOSIS — C180 Malignant neoplasm of cecum: Secondary | ICD-10-CM

## 2018-11-13 NOTE — Telephone Encounter (Signed)
-----   Message from Ladell Pier, MD sent at 11/12/2018  5:50 PM EDT ----- Please call patient, the CEA is mildly elevated-just above the normal range, has been in high normal range in the past, likely a chronic benign finding, repeat CEA in 3 months.  If higher I will recommend CT scans

## 2018-11-13 NOTE — Telephone Encounter (Signed)
Notified of higher CEA and likely benign per MD. They agree to repeat CEA in 3 months. Informed her if higher, MD may order CT scan.

## 2018-11-13 NOTE — Telephone Encounter (Signed)
Scheduled appt per 3/18 sch message - pt is aware of appt date and time   

## 2018-11-19 DIAGNOSIS — M4807 Spinal stenosis, lumbosacral region: Secondary | ICD-10-CM | POA: Diagnosis not present

## 2018-12-11 DIAGNOSIS — Z125 Encounter for screening for malignant neoplasm of prostate: Secondary | ICD-10-CM | POA: Diagnosis not present

## 2018-12-11 DIAGNOSIS — R7301 Impaired fasting glucose: Secondary | ICD-10-CM | POA: Diagnosis not present

## 2018-12-11 DIAGNOSIS — E7849 Other hyperlipidemia: Secondary | ICD-10-CM | POA: Diagnosis not present

## 2018-12-11 DIAGNOSIS — Z79899 Other long term (current) drug therapy: Secondary | ICD-10-CM | POA: Diagnosis not present

## 2018-12-11 DIAGNOSIS — M859 Disorder of bone density and structure, unspecified: Secondary | ICD-10-CM | POA: Diagnosis not present

## 2018-12-11 DIAGNOSIS — E538 Deficiency of other specified B group vitamins: Secondary | ICD-10-CM | POA: Diagnosis not present

## 2018-12-18 DIAGNOSIS — C61 Malignant neoplasm of prostate: Secondary | ICD-10-CM | POA: Diagnosis not present

## 2018-12-18 DIAGNOSIS — C180 Malignant neoplasm of cecum: Secondary | ICD-10-CM | POA: Diagnosis not present

## 2018-12-18 DIAGNOSIS — D500 Iron deficiency anemia secondary to blood loss (chronic): Secondary | ICD-10-CM | POA: Diagnosis not present

## 2018-12-18 DIAGNOSIS — I739 Peripheral vascular disease, unspecified: Secondary | ICD-10-CM | POA: Diagnosis not present

## 2018-12-18 DIAGNOSIS — Z1331 Encounter for screening for depression: Secondary | ICD-10-CM | POA: Diagnosis not present

## 2018-12-18 DIAGNOSIS — M5136 Other intervertebral disc degeneration, lumbar region: Secondary | ICD-10-CM | POA: Diagnosis not present

## 2018-12-18 DIAGNOSIS — Z0000 Encounter for general adult medical examination without abnormal findings: Secondary | ICD-10-CM | POA: Diagnosis not present

## 2018-12-18 DIAGNOSIS — Z952 Presence of prosthetic heart valve: Secondary | ICD-10-CM | POA: Diagnosis not present

## 2018-12-18 DIAGNOSIS — M79662 Pain in left lower leg: Secondary | ICD-10-CM | POA: Diagnosis not present

## 2018-12-18 DIAGNOSIS — I480 Paroxysmal atrial fibrillation: Secondary | ICD-10-CM | POA: Diagnosis not present

## 2018-12-18 DIAGNOSIS — I493 Ventricular premature depolarization: Secondary | ICD-10-CM | POA: Diagnosis not present

## 2019-01-21 ENCOUNTER — Other Ambulatory Visit (HOSPITAL_COMMUNITY): Payer: Medicare Other

## 2019-01-21 ENCOUNTER — Encounter (HOSPITAL_COMMUNITY): Payer: Medicare Other

## 2019-01-21 ENCOUNTER — Encounter (HOSPITAL_COMMUNITY): Payer: Self-pay

## 2019-01-21 DIAGNOSIS — C61 Malignant neoplasm of prostate: Secondary | ICD-10-CM | POA: Diagnosis not present

## 2019-01-21 DIAGNOSIS — N200 Calculus of kidney: Secondary | ICD-10-CM | POA: Diagnosis not present

## 2019-01-23 ENCOUNTER — Telehealth: Payer: Self-pay | Admitting: Cardiology

## 2019-01-23 DIAGNOSIS — M1711 Unilateral primary osteoarthritis, right knee: Secondary | ICD-10-CM | POA: Diagnosis not present

## 2019-01-23 NOTE — Telephone Encounter (Signed)
LVM for patient to call regarding changing his appt to virtual.

## 2019-01-27 ENCOUNTER — Telehealth: Payer: Self-pay | Admitting: Cardiology

## 2019-01-27 NOTE — Telephone Encounter (Signed)
New Message    Pts wife is returning call and said they want to do a phone visit    Please call back

## 2019-01-27 NOTE — Telephone Encounter (Signed)
Spoke with wife and discussed PHONE virtual visit with MD.      Virtual Visit Pre-Appointment Phone Call  "(Name), I am calling you today to discuss your upcoming appointment. We are currently trying to limit exposure to the virus that causes COVID-19 by seeing patients at home rather than in the office."  1. "What is the BEST phone number to call the day of the visit?" - include this in appointment notes  2. "Do you have or have access to (through a family member/friend) a smartphone with video capability that we can use for your visit?" -- PHONE visit only  3. Confirm consent - "In the setting of the current Covid19 crisis, you are scheduled for a (phone or video) visit with your provider on (date) at (time).  Just as we do with many in-office visits, in order for you to participate in this visit, we must obtain consent.  If you'd like, I can send this to your mychart (if signed up) or email for you to review.  Otherwise, I can obtain your verbal consent now.  All virtual visits are billed to your insurance company just like a normal visit would be.  By agreeing to a virtual visit, we'd like you to understand that the technology does not allow for your provider to perform an examination, and thus may limit your provider's ability to fully assess your condition. If your provider identifies any concerns that need to be evaluated in person, we will make arrangements to do so.  Finally, though the technology is pretty good, we cannot assure that it will always work on either your or our end, and in the setting of a video visit, we may have to convert it to a phone-only visit.  In either situation, we cannot ensure that we have a secure connection.  Are you willing to proceed?" STAFF: Did the patient verbally acknowledge consent to telehealth visit? Document YES/NO here: YES  4. Advise patient to be prepared - "Two hours prior to your appointment, go ahead and check your blood pressure, pulse, oxygen  saturation, and your weight (if you have the equipment to check those) and write them all down. When your visit starts, your provider will ask you for this information. If you have an Apple Watch or Kardia device, please plan to have heart rate information ready on the day of your appointment. Please have a pen and paper handy nearby the day of the visit as well."  5. Give patient instructions for MyChart download to smartphone OR [email or web address] as below if video visit (depending on what platform provider is using)  6. Inform patient they will receive a phone call 15 minutes prior to their appointment time (may be from unknown caller ID) so they should be prepared to answer    TELEPHONE CALL NOTE  Jason [name] has been deemed a candidate for a follow-up tele-health visit to limit community exposure during the Covid-19 pandemic. I spoke with the patient via phone to ensure availability of phone/video source, confirm preferred email & phone number, and discuss instructions and expectations.  I reminded Jason [name] to be prepared with any vital sign and/or heart rhythm information that could potentially be obtained via home monitoring, at the time of his visit. I reminded Jason [name] to expect a phone call prior to his visit.  Fidel Levy, RN 01/27/2019 10:43 AM      FULL LENGTH CONSENT FOR TELE-HEALTH VISIT   I hereby voluntarily request, consent  and authorize [facility] and its employed or contracted physicians, Engineer, materials, nurse practitioners or other licensed health care professionals (the Practitioner), to provide me with telemedicine health care services (the "Services") as deemed necessary by the treating Practitioner. I acknowledge and consent to receive the Services by the Practitioner via telemedicine. I understand that the telemedicine visit will involve communicating with the Practitioner through live audiovisual communication technology and the  disclosure of certain medical information by electronic transmission. I acknowledge that I have been given the opportunity to request an in-person assessment or other available alternative prior to the telemedicine visit and am voluntarily participating in the telemedicine visit.  I understand that I have the right to withhold or withdraw my consent to the use of telemedicine in the course of my care at any time, without affecting my right to future care or treatment, and that the Practitioner or I may terminate the telemedicine visit at any time. I understand that I have the right to inspect all information obtained and/or recorded in the course of the telemedicine visit and may receive copies of available information for a reasonable fee.  I understand that some of the potential risks of receiving the Services via telemedicine include:  [name] Delay or interruption in medical evaluation due to technological equipment failure or disruption; . Information transmitted may not be sufficient (e.g. poor resolution of images) to allow for appropriate medical decision making by the Practitioner; and/or  . In rare instances, security protocols could fail, causing a breach of personal health information.  Furthermore, I acknowledge that it is my responsibility to provide information about my medical history, conditions and care that is complete and accurate to the best of my ability. I acknowledge that Practitioner's advice, recommendations, and/or decision may be based on factors not within their control, such as incomplete or inaccurate data provided by me or distortions of diagnostic images or specimens that may result from electronic transmissions. I understand that the practice of medicine is not an exact science and that Practitioner makes no warranties or guarantees regarding treatment outcomes. I acknowledge that I will receive a copy of this consent concurrently upon execution via email to the email address I  last provided but may also request a printed copy by calling the office of [place].    I understand that my insurance will be billed for this visit.   I have read or had this consent read to me. . I understand the contents of this consent, which adequately explains the benefits and risks of the Services being provided via telemedicine.  . I have been provided ample opportunity to ask questions regarding this consent and the Services and have had my questions answered to my satisfaction. . I give my informed consent for the services to be provided through the use of telemedicine in my medical care  By participating in this telemedicine visit I agree to the above.

## 2019-01-30 ENCOUNTER — Telehealth: Payer: Self-pay | Admitting: Cardiology

## 2019-01-30 NOTE — Telephone Encounter (Signed)
call home phone/ consent/ my chart via emailed/ pre reg completed  °

## 2019-01-31 ENCOUNTER — Encounter (HOSPITAL_COMMUNITY): Payer: Medicare Other

## 2019-01-31 DIAGNOSIS — H401133 Primary open-angle glaucoma, bilateral, severe stage: Secondary | ICD-10-CM | POA: Diagnosis not present

## 2019-01-31 NOTE — Progress Notes (Signed)
Virtual Visit via Telephone Note   This visit type was conducted due to national recommendations for restrictions regarding the COVID-19 Pandemic (e.g. social distancing) in an effort to limit this patient's exposure and mitigate transmission in our community.  Due to his co-morbid illnesses, this patient is at least at moderate risk for complications without adequate follow up.  This format is felt to be most appropriate for this patient at this time.  The patient did not have access to video technology/had technical difficulties with video requiring transitioning to audio format only (telephone).  All issues noted in this document were discussed and addressed.  No physical exam could be performed with this format.  Please refer to the patient's chart for his  consent to telehealth for [facility].   Date:  02/05/2019   ID:  Jason [name], DOB Jun 15, 1926, MRN [id number]  Patient Location: Home Provider Location: Home  PCP:  [name], MD  Cardiologist:  Chauncey Sciulli Martinique MD Electrophysiologist:  None   Evaluation Performed:  Follow-Up Visit  Chief Complaint:  Follow up CAD/AVR/Afib  History of Present Illness:    Jason [name] is a 85 y.o. male with He is status post tissue aortic valve replacement and coronary bypass surgery in October of 2011. He has a tissue valve prosthesis. He had a LIMA to the LAD. The RCA had 40-50% stenosis treated medically.  He has chronic atrial fibrillation. He has been treated with rate control and anticoagulation with Xarelto. In January 2016 he was admitted with a GIB. Xarelto was held. Hbg dropped to 9.7. he did not require transfusion. 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 After that he was  on coumadin.  In May 2017 his Hgb dropped. He was transfused.  No obvious bleeding.  He was started on iron infusions.   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 He was discharged on  10/27/2016, however returned the same afternoon with complaint of GI bleed. He was seen by Dr. Curt Bears on 10/29/2016, his beta blocker was increased to help rate control with his permanent atrial fibrillation. There was some wide complex tachycardia c/w aberrancy versus VT.  He was started on Xeloda and due to interaction with Coumadin he was switched to Eliquis.  On follow up today he is doing very well. Denies any chest pain, palpitations, dizziness, SOB, or edema. No bleeding. He is tolerating his medication well and reports he will now be able to get his medication through the [place]. His last CEA was mildly elevated and will be repeated by oncology.   The patient does not have symptoms concerning for COVID-19 infection (fever, chills, cough, or new shortness of breath).    Past Medical History:  Diagnosis Date  . Abnormal PSA   . Adenomatous polyp of colon 2010 and before 2006  . Anemia   . Aortic stenosis   . Aortic valve prosthesis present   . Atrial fibrillation ([place])   . CAD (coronary artery disease)   . DDD (degenerative disc disease), lumbar   . Depression   . Diverticulosis   . Dyslipidemia   . GERD (gastroesophageal reflux disease)   . Glaucoma   . History of blood transfusion 09/29/2016  . Hx of CABG   . Hypogonadism, male   . Macular degeneration   . OSA (obstructive sleep apnea)   . Osteoarthritis   . Osteopenia   . Prostate CA [facility]) prostate 2007  colon dx 2018, watching psa levels, no tx yet for prostate   Past Surgical History:  Procedure Laterality Date  . AORTIC VALVE REPLACEMENT  October 2011   Magna Ease pericardial tissue valve #41mm  . [place]   lower  . CATARACT EXTRACTION Bilateral   . CORNEAL TRANSPLANT Right   . CORONARY ARTERY BYPASS GRAFT  October 2011   LIMA to LAD  . ESOPHAGOGASTRODUODENOSCOPY N/A 09/16/2014   Procedure: ESOPHAGOGASTRODUODENOSCOPY (EGD);  Surgeon: [name], MD;  Location: [facility] ENDOSCOPY;  Service: Endoscopy;   Laterality: N/A;  . TONSILLECTOMY  76 yrs ago     Current Meds  Medication Sig  . acetaminophen (TYLENOL) 325 MG tablet Take 650 mg by mouth every 6 (six) hours as needed.  [name] amitriptyline (ELAVIL) 25 MG tablet Take 25 mg by mouth daily.  [name] apixaban (ELIQUIS) 5 MG TABS tablet Take 5 mg by mouth 2 (two) times daily.  . brimonidine (ALPHAGAN) 0.2 % ophthalmic solution Place 1 drop into the left eye 2 (two) times daily.  . Calcium Carbonate-Vitamin D (CALCIUM-VITAMIN D) 500-200 MG-UNIT per tablet Take 1 tablet by mouth daily.  . Cholecalciferol 1000 UNITS capsule Take 1,000 Units by mouth daily.   . dorzolamide-timolol (COSOPT) 22.3-6.8 MG/ML ophthalmic solution Place 1 drop into both eyes 2 (two) times daily.  . famotidine (PEPCID) 20 MG tablet Take 20 mg by mouth 2 (two) times daily.  . fluticasone (FLONASE) 50 MCG/ACT nasal spray Place 1 spray into both nostrils daily as needed for allergies or rhinitis.   . Glucosamine-Chondroitin-MSM TABS Take by mouth daily.  [name] latanoprost (XALATAN) 0.005 % ophthalmic solution Place 1 drop into both eyes daily.  [name] loperamide (IMODIUM) 2 MG capsule Take 2 mg by mouth as needed for diarrhea or loose stools.  . metoprolol tartrate (LOPRESSOR) 25 MG tablet TAKE 1 TABLET BY MOUTH TWICE A DAY  . Multiple Vitamin (MULTIVITAMIN WITH MINERALS) TABS tablet Take 1 tablet by mouth daily.  [name] omega-3 acid ethyl esters (LOVAZA) 1 G capsule Take 1 g by mouth daily.   [name] perphenazine (TRILAFON) 4 MG tablet Take 4 mg by mouth daily.  . prednisoLONE acetate (PRED FORTE) 1 % ophthalmic suspension Place 1 drop into the right eye every other day.   . psyllium (METAMUCIL) 58.6 % powder Take 1 packet by mouth daily.  . rosuvastatin (CRESTOR) 5 MG tablet Take 5 mg by mouth every other day.   . vitamin B-12 (CYANOCOBALAMIN) 1000 MCG tablet Take 1,000 mcg by mouth daily.     Allergies:   Patient has no known allergies.   Social History   Tobacco Use  . Smoking status: Former  Smoker    Packs/day: 1.00    Years: 40.00    Pack years: 40.00    Types: Cigarettes    Last attempt to quit: 11/16/1990    Years since quitting: 28.2  . Smokeless tobacco: Never Used  Substance Use Topics  . Alcohol use: Yes    Alcohol/week: 2.0 standard drinks    Types: 2 Glasses of wine per week    Comment: wine few x per week  . Drug use: No     Family Hx: The patient's family history includes Heart attack in his father. There is no history of Colon cancer, Esophageal cancer, Stomach cancer, or Rectal cancer.  ROS:   Please see the history of present illness.    All other systems reviewed and are negative.   Prior CV studies:  The following studies were reviewed today:  none  Labs/Other Tests and Data Reviewed:    EKG:  No ECG reviewed.  Recent Labs: No results found for requested labs within last 8760 hours.   Recent Lipid Panel Lab Results  Component Value Date/Time   CHOL 89 01/03/2012 08:35 AM   TRIG 125.0 01/03/2012 08:35 AM   HDL 37.40 (L) 01/03/2012 08:35 AM   CHOLHDL 2 01/03/2012 08:35 AM   LDLCALC 27 01/03/2012 08:35 AM   Dated 12/11/18: A1c 5.3%. cholesterol 104, triglycerides 136, HDL 34, LDL 43. Creatinine 1.1. otherwise CMET, CBC, TSH normal  Wt Readings from Last 3 Encounters:  02/05/19 168 lb (76.2 kg)  11/11/18 172 lb 1.6 oz (78.1 kg)  08/07/18 174 lb 6.4 oz (79.1 kg)     Objective:    Vital Signs:  BP 137/77   Pulse 94   Ht 5\' 11"  (1.803 m)   Wt 168 lb (76.2 kg)   BMI 23.43 kg/m    VITAL SIGNS:  reviewed  ASSESSMENT & PLAN:    1. Status post tissue aortic valve replacement for severe aortic stenosis in October 2011. Clinically doing well. Good valve function by Echo November 2015.   2. Coronary disease status post CABG with an LIMA graft to the LAD in October of 2011. He has no angina.  3. Dyslipidemia excellent control on Crestor and fish oil.   4. PVCs. These are chronic. Asymptomatic.  5. Atrial fibrillation. Chronic.  Asymptomatic. Rate controlled on metoprolol. He has a [place] vasc score of 4 placing him at higher risk of CVA. History of GIB on Xarelto. Now on Eliquis.  6. GIB. Related to gastric AVM in the past. More recently dx with cecal CA. Stable Hgb.   7. Cecal CA. S/p surgery and chemo with Xeloda. followed by oncology.  COVID-19 Education: The signs and symptoms of COVID-19 were discussed with the patient and how to seek care for testing (follow up with PCP or arrange E-visit).  The importance of social distancing was discussed today.  Time:   Today, I have spent 10 minutes with the patient with telehealth technology discussing the above problems.     Medication Adjustments/Labs and Tests Ordered: Current medicines are reviewed at length with the patient today.  Concerns regarding medicines are outlined above.   Tests Ordered: No orders of the defined types were placed in this encounter.   Medication Changes: No orders of the defined types were placed in this encounter.   Disposition:  Follow up in 6 month(s)  Signed, Daemon Dowty Martinique, MD  02/05/2019 8:35 AM    [facility]

## 2019-02-03 DIAGNOSIS — H401123 Primary open-angle glaucoma, left eye, severe stage: Secondary | ICD-10-CM | POA: Diagnosis not present

## 2019-02-03 DIAGNOSIS — H401113 Primary open-angle glaucoma, right eye, severe stage: Secondary | ICD-10-CM | POA: Diagnosis not present

## 2019-02-04 ENCOUNTER — Encounter (HOSPITAL_COMMUNITY)
Admission: RE | Admit: 2019-02-04 | Discharge: 2019-02-04 | Disposition: A | Discharge status: Home / Self Care | Payer: Medicare Other | Source: Ambulatory Visit | Attending: Urology | Admitting: Urology

## 2019-02-04 ENCOUNTER — Other Ambulatory Visit: Payer: Self-pay

## 2019-02-04 DIAGNOSIS — C61 Malignant neoplasm of prostate: Secondary | ICD-10-CM | POA: Diagnosis not present

## 2019-02-04 MED ORDER — TECHNETIUM TC 99M MEDRONATE IV KIT
20.1000 | PACK | Freq: Once | INTRAVENOUS | Status: AC | PRN
  Administered 2019-02-04: 20.1 via INTRAVENOUS

## 2019-02-05 ENCOUNTER — Telehealth (INDEPENDENT_AMBULATORY_CARE_PROVIDER_SITE_OTHER): Payer: Medicare Other | Admitting: Cardiology

## 2019-02-05 ENCOUNTER — Encounter: Payer: Self-pay | Admitting: Cardiology

## 2019-02-05 VITALS — BP 137/77 | HR 94 | Ht 71.0 in | Wt 168.0 lb

## 2019-02-05 DIAGNOSIS — I10 Essential (primary) hypertension: Secondary | ICD-10-CM

## 2019-02-05 DIAGNOSIS — I359 Nonrheumatic aortic valve disorder, unspecified: Secondary | ICD-10-CM

## 2019-02-05 DIAGNOSIS — Z952 Presence of prosthetic heart valve: Secondary | ICD-10-CM

## 2019-02-05 DIAGNOSIS — I4821 Permanent atrial fibrillation: Secondary | ICD-10-CM | POA: Diagnosis not present

## 2019-02-05 DIAGNOSIS — I25810 Atherosclerosis of coronary artery bypass graft(s) without angina pectoris: Secondary | ICD-10-CM

## 2019-02-05 NOTE — Patient Instructions (Signed)

## 2019-02-11 DIAGNOSIS — C61 Malignant neoplasm of prostate: Secondary | ICD-10-CM | POA: Diagnosis not present

## 2019-02-13 ENCOUNTER — Other Ambulatory Visit: Payer: Self-pay

## 2019-02-13 ENCOUNTER — Inpatient Hospital Stay: Payer: Medicare Other | Attending: Oncology

## 2019-02-13 DIAGNOSIS — C779 Secondary and unspecified malignant neoplasm of lymph node, unspecified: Secondary | ICD-10-CM | POA: Insufficient documentation

## 2019-02-13 DIAGNOSIS — D509 Iron deficiency anemia, unspecified: Secondary | ICD-10-CM | POA: Insufficient documentation

## 2019-02-13 DIAGNOSIS — C180 Malignant neoplasm of cecum: Secondary | ICD-10-CM | POA: Diagnosis not present

## 2019-02-13 DIAGNOSIS — Z8546 Personal history of malignant neoplasm of prostate: Secondary | ICD-10-CM | POA: Insufficient documentation

## 2019-02-13 DIAGNOSIS — Z9221 Personal history of antineoplastic chemotherapy: Secondary | ICD-10-CM | POA: Diagnosis not present

## 2019-02-13 LAB — CEA (IN HOUSE-CHCC): CEA (CHCC-In House): 5.02 ng/mL — ABNORMAL HIGH (ref 0.00–5.00)

## 2019-02-14 ENCOUNTER — Telehealth: Payer: Self-pay

## 2019-02-14 NOTE — Telephone Encounter (Signed)
-----   Message from Ladell Pier, MD sent at 02/13/2019 11:29 AM EDT ----- Please call patient, CEA is at the upper limit of the normal range, lower than 3 months ago and stable from September 2018, follow-up as scheduled

## 2019-02-14 NOTE — Telephone Encounter (Signed)
Spoke with Donia Pounds (Wife) advised per Dr. Benay Spice CEA is at the upper limits of normal range, lower than 3 months ago and stable from 2018, f/u as scheduled. Pt wife verbalized understanding.

## 2019-02-17 ENCOUNTER — Other Ambulatory Visit: Payer: Self-pay | Admitting: *Deleted

## 2019-02-17 MED ORDER — METOPROLOL TARTRATE 25 MG PO TABS: 25.0000 mg | ORAL_TABLET | Freq: Two times a day (BID) | ORAL | 3 refills | Status: DC

## 2019-02-20 DIAGNOSIS — G5732 Lesion of lateral popliteal nerve, left lower limb: Secondary | ICD-10-CM | POA: Diagnosis not present

## 2019-03-11 DIAGNOSIS — H353134 Nonexudative age-related macular degeneration, bilateral, advanced atrophic with subfoveal involvement: Secondary | ICD-10-CM | POA: Diagnosis not present

## 2019-03-11 DIAGNOSIS — H353221 Exudative age-related macular degeneration, left eye, with active choroidal neovascularization: Secondary | ICD-10-CM | POA: Diagnosis not present

## 2019-03-11 DIAGNOSIS — H353212 Exudative age-related macular degeneration, right eye, with inactive choroidal neovascularization: Secondary | ICD-10-CM | POA: Diagnosis not present

## 2019-03-11 DIAGNOSIS — H353222 Exudative age-related macular degeneration, left eye, with inactive choroidal neovascularization: Secondary | ICD-10-CM | POA: Diagnosis not present

## 2019-03-31 DIAGNOSIS — H3530 Unspecified macular degeneration: Secondary | ICD-10-CM | POA: Diagnosis not present

## 2019-03-31 DIAGNOSIS — M6281 Muscle weakness (generalized): Secondary | ICD-10-CM | POA: Diagnosis not present

## 2019-03-31 DIAGNOSIS — G5732 Lesion of lateral popliteal nerve, left lower limb: Secondary | ICD-10-CM | POA: Diagnosis not present

## 2019-03-31 DIAGNOSIS — Z9181 History of falling: Secondary | ICD-10-CM | POA: Diagnosis not present

## 2019-03-31 DIAGNOSIS — R2681 Unsteadiness on feet: Secondary | ICD-10-CM | POA: Diagnosis not present

## 2019-04-01 DIAGNOSIS — R310 Gross hematuria: Secondary | ICD-10-CM | POA: Diagnosis not present

## 2019-04-01 DIAGNOSIS — G5732 Lesion of lateral popliteal nerve, left lower limb: Secondary | ICD-10-CM | POA: Diagnosis not present

## 2019-04-01 DIAGNOSIS — M6281 Muscle weakness (generalized): Secondary | ICD-10-CM | POA: Diagnosis not present

## 2019-04-01 DIAGNOSIS — Z9181 History of falling: Secondary | ICD-10-CM | POA: Diagnosis not present

## 2019-04-01 DIAGNOSIS — R2681 Unsteadiness on feet: Secondary | ICD-10-CM | POA: Diagnosis not present

## 2019-04-01 DIAGNOSIS — H3530 Unspecified macular degeneration: Secondary | ICD-10-CM | POA: Diagnosis not present

## 2019-04-02 DIAGNOSIS — R2681 Unsteadiness on feet: Secondary | ICD-10-CM | POA: Diagnosis not present

## 2019-04-02 DIAGNOSIS — M6281 Muscle weakness (generalized): Secondary | ICD-10-CM | POA: Diagnosis not present

## 2019-04-02 DIAGNOSIS — Z9181 History of falling: Secondary | ICD-10-CM | POA: Diagnosis not present

## 2019-04-02 DIAGNOSIS — H3530 Unspecified macular degeneration: Secondary | ICD-10-CM | POA: Diagnosis not present

## 2019-04-03 DIAGNOSIS — M6281 Muscle weakness (generalized): Secondary | ICD-10-CM | POA: Diagnosis not present

## 2019-04-03 DIAGNOSIS — Z9181 History of falling: Secondary | ICD-10-CM | POA: Diagnosis not present

## 2019-04-03 DIAGNOSIS — R2681 Unsteadiness on feet: Secondary | ICD-10-CM | POA: Diagnosis not present

## 2019-04-03 DIAGNOSIS — H3530 Unspecified macular degeneration: Secondary | ICD-10-CM | POA: Diagnosis not present

## 2019-04-07 DIAGNOSIS — Z9181 History of falling: Secondary | ICD-10-CM | POA: Diagnosis not present

## 2019-04-07 DIAGNOSIS — H3530 Unspecified macular degeneration: Secondary | ICD-10-CM | POA: Diagnosis not present

## 2019-04-07 DIAGNOSIS — M6281 Muscle weakness (generalized): Secondary | ICD-10-CM | POA: Diagnosis not present

## 2019-04-07 DIAGNOSIS — M1711 Unilateral primary osteoarthritis, right knee: Secondary | ICD-10-CM | POA: Diagnosis not present

## 2019-04-07 DIAGNOSIS — R2681 Unsteadiness on feet: Secondary | ICD-10-CM | POA: Diagnosis not present

## 2019-04-08 DIAGNOSIS — R2681 Unsteadiness on feet: Secondary | ICD-10-CM | POA: Diagnosis not present

## 2019-04-08 DIAGNOSIS — M6281 Muscle weakness (generalized): Secondary | ICD-10-CM | POA: Diagnosis not present

## 2019-04-08 DIAGNOSIS — Z9181 History of falling: Secondary | ICD-10-CM | POA: Diagnosis not present

## 2019-04-08 DIAGNOSIS — H3530 Unspecified macular degeneration: Secondary | ICD-10-CM | POA: Diagnosis not present

## 2019-04-09 DIAGNOSIS — R2681 Unsteadiness on feet: Secondary | ICD-10-CM | POA: Diagnosis not present

## 2019-04-09 DIAGNOSIS — Z9181 History of falling: Secondary | ICD-10-CM | POA: Diagnosis not present

## 2019-04-09 DIAGNOSIS — H3530 Unspecified macular degeneration: Secondary | ICD-10-CM | POA: Diagnosis not present

## 2019-04-09 DIAGNOSIS — M6281 Muscle weakness (generalized): Secondary | ICD-10-CM | POA: Diagnosis not present

## 2019-04-10 DIAGNOSIS — Z9181 History of falling: Secondary | ICD-10-CM | POA: Diagnosis not present

## 2019-04-10 DIAGNOSIS — M6281 Muscle weakness (generalized): Secondary | ICD-10-CM | POA: Diagnosis not present

## 2019-04-10 DIAGNOSIS — R2681 Unsteadiness on feet: Secondary | ICD-10-CM | POA: Diagnosis not present

## 2019-04-10 DIAGNOSIS — H3530 Unspecified macular degeneration: Secondary | ICD-10-CM | POA: Diagnosis not present

## 2019-04-11 DIAGNOSIS — R2681 Unsteadiness on feet: Secondary | ICD-10-CM | POA: Diagnosis not present

## 2019-04-11 DIAGNOSIS — H3530 Unspecified macular degeneration: Secondary | ICD-10-CM | POA: Diagnosis not present

## 2019-04-11 DIAGNOSIS — Z9181 History of falling: Secondary | ICD-10-CM | POA: Diagnosis not present

## 2019-04-11 DIAGNOSIS — M6281 Muscle weakness (generalized): Secondary | ICD-10-CM | POA: Diagnosis not present

## 2019-04-14 DIAGNOSIS — M1711 Unilateral primary osteoarthritis, right knee: Secondary | ICD-10-CM | POA: Diagnosis not present

## 2019-04-14 DIAGNOSIS — R2681 Unsteadiness on feet: Secondary | ICD-10-CM | POA: Diagnosis not present

## 2019-04-14 DIAGNOSIS — Z9181 History of falling: Secondary | ICD-10-CM | POA: Diagnosis not present

## 2019-04-14 DIAGNOSIS — M6281 Muscle weakness (generalized): Secondary | ICD-10-CM | POA: Diagnosis not present

## 2019-04-14 DIAGNOSIS — H3530 Unspecified macular degeneration: Secondary | ICD-10-CM | POA: Diagnosis not present

## 2019-04-15 DIAGNOSIS — R2681 Unsteadiness on feet: Secondary | ICD-10-CM | POA: Diagnosis not present

## 2019-04-15 DIAGNOSIS — H3530 Unspecified macular degeneration: Secondary | ICD-10-CM | POA: Diagnosis not present

## 2019-04-15 DIAGNOSIS — M6281 Muscle weakness (generalized): Secondary | ICD-10-CM | POA: Diagnosis not present

## 2019-04-15 DIAGNOSIS — Z9181 History of falling: Secondary | ICD-10-CM | POA: Diagnosis not present

## 2019-04-16 DIAGNOSIS — M6281 Muscle weakness (generalized): Secondary | ICD-10-CM | POA: Diagnosis not present

## 2019-04-16 DIAGNOSIS — H3530 Unspecified macular degeneration: Secondary | ICD-10-CM | POA: Diagnosis not present

## 2019-04-16 DIAGNOSIS — R2681 Unsteadiness on feet: Secondary | ICD-10-CM | POA: Diagnosis not present

## 2019-04-16 DIAGNOSIS — Z9181 History of falling: Secondary | ICD-10-CM | POA: Diagnosis not present

## 2019-04-17 DIAGNOSIS — Z9181 History of falling: Secondary | ICD-10-CM | POA: Diagnosis not present

## 2019-04-17 DIAGNOSIS — M6281 Muscle weakness (generalized): Secondary | ICD-10-CM | POA: Diagnosis not present

## 2019-04-17 DIAGNOSIS — R2681 Unsteadiness on feet: Secondary | ICD-10-CM | POA: Diagnosis not present

## 2019-04-17 DIAGNOSIS — H3530 Unspecified macular degeneration: Secondary | ICD-10-CM | POA: Diagnosis not present

## 2019-04-18 ENCOUNTER — Telehealth: Payer: Self-pay | Admitting: Oncology

## 2019-04-18 DIAGNOSIS — R2681 Unsteadiness on feet: Secondary | ICD-10-CM | POA: Diagnosis not present

## 2019-04-18 DIAGNOSIS — H3530 Unspecified macular degeneration: Secondary | ICD-10-CM | POA: Diagnosis not present

## 2019-04-18 DIAGNOSIS — Z9181 History of falling: Secondary | ICD-10-CM | POA: Diagnosis not present

## 2019-04-18 DIAGNOSIS — M6281 Muscle weakness (generalized): Secondary | ICD-10-CM | POA: Diagnosis not present

## 2019-04-18 NOTE — Telephone Encounter (Signed)
LT PAL 9/16 f/u moved to GBS. Confirmed with patient. Schedule mailed.

## 2019-04-21 DIAGNOSIS — M1711 Unilateral primary osteoarthritis, right knee: Secondary | ICD-10-CM | POA: Diagnosis not present

## 2019-04-22 DIAGNOSIS — H3530 Unspecified macular degeneration: Secondary | ICD-10-CM | POA: Diagnosis not present

## 2019-04-22 DIAGNOSIS — R2681 Unsteadiness on feet: Secondary | ICD-10-CM | POA: Diagnosis not present

## 2019-04-22 DIAGNOSIS — M6281 Muscle weakness (generalized): Secondary | ICD-10-CM | POA: Diagnosis not present

## 2019-04-22 DIAGNOSIS — Z9181 History of falling: Secondary | ICD-10-CM | POA: Diagnosis not present

## 2019-04-25 DIAGNOSIS — H3530 Unspecified macular degeneration: Secondary | ICD-10-CM | POA: Diagnosis not present

## 2019-04-25 DIAGNOSIS — R2681 Unsteadiness on feet: Secondary | ICD-10-CM | POA: Diagnosis not present

## 2019-04-25 DIAGNOSIS — Z9181 History of falling: Secondary | ICD-10-CM | POA: Diagnosis not present

## 2019-04-25 DIAGNOSIS — M6281 Muscle weakness (generalized): Secondary | ICD-10-CM | POA: Diagnosis not present

## 2019-04-28 DIAGNOSIS — M6281 Muscle weakness (generalized): Secondary | ICD-10-CM | POA: Diagnosis not present

## 2019-04-28 DIAGNOSIS — R2681 Unsteadiness on feet: Secondary | ICD-10-CM | POA: Diagnosis not present

## 2019-04-28 DIAGNOSIS — H3530 Unspecified macular degeneration: Secondary | ICD-10-CM | POA: Diagnosis not present

## 2019-04-28 DIAGNOSIS — Z9181 History of falling: Secondary | ICD-10-CM | POA: Diagnosis not present

## 2019-04-30 DIAGNOSIS — H3530 Unspecified macular degeneration: Secondary | ICD-10-CM | POA: Diagnosis not present

## 2019-04-30 DIAGNOSIS — M6281 Muscle weakness (generalized): Secondary | ICD-10-CM | POA: Diagnosis not present

## 2019-04-30 DIAGNOSIS — H409 Unspecified glaucoma: Secondary | ICD-10-CM | POA: Diagnosis not present

## 2019-05-02 DIAGNOSIS — H409 Unspecified glaucoma: Secondary | ICD-10-CM | POA: Diagnosis not present

## 2019-05-02 DIAGNOSIS — H3530 Unspecified macular degeneration: Secondary | ICD-10-CM | POA: Diagnosis not present

## 2019-05-02 DIAGNOSIS — M6281 Muscle weakness (generalized): Secondary | ICD-10-CM | POA: Diagnosis not present

## 2019-05-05 DIAGNOSIS — H3530 Unspecified macular degeneration: Secondary | ICD-10-CM | POA: Diagnosis not present

## 2019-05-05 DIAGNOSIS — M6281 Muscle weakness (generalized): Secondary | ICD-10-CM | POA: Diagnosis not present

## 2019-05-05 DIAGNOSIS — H409 Unspecified glaucoma: Secondary | ICD-10-CM | POA: Diagnosis not present

## 2019-05-07 DIAGNOSIS — H3530 Unspecified macular degeneration: Secondary | ICD-10-CM | POA: Diagnosis not present

## 2019-05-07 DIAGNOSIS — H409 Unspecified glaucoma: Secondary | ICD-10-CM | POA: Diagnosis not present

## 2019-05-07 DIAGNOSIS — M6281 Muscle weakness (generalized): Secondary | ICD-10-CM | POA: Diagnosis not present

## 2019-05-10 DIAGNOSIS — Z23 Encounter for immunization: Secondary | ICD-10-CM | POA: Diagnosis not present

## 2019-05-13 DIAGNOSIS — H353221 Exudative age-related macular degeneration, left eye, with active choroidal neovascularization: Secondary | ICD-10-CM | POA: Diagnosis not present

## 2019-05-13 DIAGNOSIS — H353134 Nonexudative age-related macular degeneration, bilateral, advanced atrophic with subfoveal involvement: Secondary | ICD-10-CM | POA: Diagnosis not present

## 2019-05-14 ENCOUNTER — Other Ambulatory Visit: Payer: Medicare Other

## 2019-05-14 ENCOUNTER — Telehealth: Payer: Self-pay | Admitting: Oncology

## 2019-05-14 ENCOUNTER — Other Ambulatory Visit: Payer: Self-pay

## 2019-05-14 ENCOUNTER — Inpatient Hospital Stay (HOSPITAL_BASED_OUTPATIENT_CLINIC_OR_DEPARTMENT_OTHER): Payer: Medicare Other | Admitting: Oncology

## 2019-05-14 ENCOUNTER — Inpatient Hospital Stay: Payer: Medicare Other | Attending: Oncology

## 2019-05-14 ENCOUNTER — Ambulatory Visit: Payer: Medicare Other | Admitting: Nurse Practitioner

## 2019-05-14 VITALS — BP 133/82 | HR 79 | Temp 98.2°F | Resp 16 | Ht 71.0 in | Wt 172.2 lb

## 2019-05-14 DIAGNOSIS — Z8546 Personal history of malignant neoplasm of prostate: Secondary | ICD-10-CM | POA: Diagnosis not present

## 2019-05-14 DIAGNOSIS — I2510 Atherosclerotic heart disease of native coronary artery without angina pectoris: Secondary | ICD-10-CM | POA: Diagnosis not present

## 2019-05-14 DIAGNOSIS — H409 Unspecified glaucoma: Secondary | ICD-10-CM | POA: Insufficient documentation

## 2019-05-14 DIAGNOSIS — Z951 Presence of aortocoronary bypass graft: Secondary | ICD-10-CM | POA: Diagnosis not present

## 2019-05-14 DIAGNOSIS — D509 Iron deficiency anemia, unspecified: Secondary | ICD-10-CM | POA: Insufficient documentation

## 2019-05-14 DIAGNOSIS — Z7901 Long term (current) use of anticoagulants: Secondary | ICD-10-CM | POA: Diagnosis not present

## 2019-05-14 DIAGNOSIS — C180 Malignant neoplasm of cecum: Secondary | ICD-10-CM

## 2019-05-14 DIAGNOSIS — Z79899 Other long term (current) drug therapy: Secondary | ICD-10-CM | POA: Diagnosis not present

## 2019-05-14 DIAGNOSIS — I25810 Atherosclerosis of coronary artery bypass graft(s) without angina pectoris: Secondary | ICD-10-CM

## 2019-05-14 DIAGNOSIS — C779 Secondary and unspecified malignant neoplasm of lymph node, unspecified: Secondary | ICD-10-CM | POA: Diagnosis not present

## 2019-05-14 DIAGNOSIS — R9721 Rising PSA following treatment for malignant neoplasm of prostate: Secondary | ICD-10-CM | POA: Diagnosis not present

## 2019-05-14 LAB — CEA (IN HOUSE-CHCC): CEA (CHCC-In House): 5.52 ng/mL — ABNORMAL HIGH (ref 0.00–5.00)

## 2019-05-14 NOTE — Telephone Encounter (Signed)
Gave avs and calendar ° °

## 2019-05-14 NOTE — Progress Notes (Signed)
  Jason [name] OFFICE PROGRESS NOTE   Diagnosis: Colon cancer, prostate cancer  INTERVAL HISTORY:   Jason [name] returns as scheduled.  He feels well.  Good appetite.  No pain.  He reports "dark "urine recently.  The PSA was elevated at 76 on 02/12/2019.  He is scheduled to see Dr. Lovena Neighbours next week.  Objective:  Vital signs in last 24 hours:  Blood pressure 133/82, pulse 79, temperature 98.2 F (36.8 C), resp. rate 16, height '5\' 11"'$  (1.803 m), weight 172 lb 3.2 oz (78.1 kg), SpO2 99 %.    Limited physical examination secondary to distancing with the COVID pandemic Lymphatics: No cervical, supraclavicular, axillary, or inguinal nodes GI: Nontender, no mass, no hepatosplenomegaly Vascular: No leg edema  Lab Results:    Lab Results  Component Value Date   CEA1 5.52 (H) 05/14/2019     Medications: I have reviewed the patient's current medications.   Assessment/Plan:  1. Colon cancer, cecum, stage IIIB (T4a,N1b), status post a right colectomy 10/24/2016  3/23 lymph nodes positive for metastatic adenocarcinoma  MSI-stable, no loss of mismatch repair protein expression  Cycle 1 adjuvant Xeloda 11/20/2016  Cycle 2 adjuvant Xeloda 12/12/2016 (dose reduced due to mucositis following cycle 1)  Cycle 3 adjuvant Xeloda 01/02/2017  Cycle 4 adjuvant Xeloda 01/23/2017  Cycle 5 adjuvant Xeloda 02/13/2017  Cycle 6 adjuvant Xeloda 03/06/2017  Cycle 7 adjuvant Xeloda 03/27/2017  Cycle 8 adjuvant Xeloda 04/17/2017  Colonoscopy on 03/11/2018-3 mm polyp in the descending colon (tubular adenoma, no high-grade dysplasia)  2. History of iron deficiency anemia secondary to #1, status post IV iron therapy and multiple red cell transfusions; hemoglobin improved 11/16/2016;hemoglobin normal range 01/17/2017  3. Prostate cancer followed by urology with observation  Bone scan 04/24/2018- no change from comparison exam 10/19/2017.  No evidence of skeletal metastasis.   4. History of coronary artery disease, status post coronary artery bypass surgery 2011  5. Aortic valve replacement 2011  6. Atrial fibrillation-previously maintained on Coumadin;switched to Eliquisdue to known interaction between Coumadin and Xeloda  7. Glaucoma  8. Mucositis secondary to Xeloda following cycle 1; Xeloda dose reduced beginning with cycle 2.    Disposition: Jason [name] is a clinical remission from colon cancer.  The the CEA is mildly elevated and has not changed significantly over the past 2 years.  The elevated CEA may be related to prostate cancer.  He will continue follow-up with Dr. Lovena Neighbours for management of prostate cancer.  He will return for an office visit and CEA in 6 months.  I am available to see him sooner as needed.  Betsy Coder, MD  05/14/2019  11:32 AM

## 2019-05-15 DIAGNOSIS — G5732 Lesion of lateral popliteal nerve, left lower limb: Secondary | ICD-10-CM | POA: Diagnosis not present

## 2019-06-02 DIAGNOSIS — M1711 Unilateral primary osteoarthritis, right knee: Secondary | ICD-10-CM | POA: Diagnosis not present

## 2019-06-16 DIAGNOSIS — Z7901 Long term (current) use of anticoagulants: Secondary | ICD-10-CM | POA: Diagnosis not present

## 2019-06-16 DIAGNOSIS — E538 Deficiency of other specified B group vitamins: Secondary | ICD-10-CM | POA: Diagnosis not present

## 2019-06-16 DIAGNOSIS — D500 Iron deficiency anemia secondary to blood loss (chronic): Secondary | ICD-10-CM | POA: Diagnosis not present

## 2019-06-16 DIAGNOSIS — I480 Paroxysmal atrial fibrillation: Secondary | ICD-10-CM | POA: Diagnosis not present

## 2019-06-16 DIAGNOSIS — M5136 Other intervertebral disc degeneration, lumbar region: Secondary | ICD-10-CM | POA: Diagnosis not present

## 2019-06-16 DIAGNOSIS — C61 Malignant neoplasm of prostate: Secondary | ICD-10-CM | POA: Diagnosis not present

## 2019-06-16 DIAGNOSIS — R7301 Impaired fasting glucose: Secondary | ICD-10-CM | POA: Diagnosis not present

## 2019-06-16 DIAGNOSIS — I739 Peripheral vascular disease, unspecified: Secondary | ICD-10-CM | POA: Diagnosis not present

## 2019-06-16 DIAGNOSIS — C180 Malignant neoplasm of cecum: Secondary | ICD-10-CM | POA: Diagnosis not present

## 2019-06-16 DIAGNOSIS — F329 Major depressive disorder, single episode, unspecified: Secondary | ICD-10-CM | POA: Diagnosis not present

## 2019-06-16 DIAGNOSIS — Z952 Presence of prosthetic heart valve: Secondary | ICD-10-CM | POA: Diagnosis not present

## 2019-06-24 DIAGNOSIS — H353134 Nonexudative age-related macular degeneration, bilateral, advanced atrophic with subfoveal involvement: Secondary | ICD-10-CM | POA: Diagnosis not present

## 2019-06-24 DIAGNOSIS — H353221 Exudative age-related macular degeneration, left eye, with active choroidal neovascularization: Secondary | ICD-10-CM | POA: Diagnosis not present

## 2019-06-25 DIAGNOSIS — H401113 Primary open-angle glaucoma, right eye, severe stage: Secondary | ICD-10-CM | POA: Diagnosis not present

## 2019-06-25 DIAGNOSIS — H401123 Primary open-angle glaucoma, left eye, severe stage: Secondary | ICD-10-CM | POA: Diagnosis not present

## 2019-06-25 DIAGNOSIS — H31013 Macula scars of posterior pole (postinflammatory) (post-traumatic), bilateral: Secondary | ICD-10-CM | POA: Diagnosis not present

## 2019-06-25 DIAGNOSIS — Z947 Corneal transplant status: Secondary | ICD-10-CM | POA: Diagnosis not present

## 2019-07-17 DIAGNOSIS — G5732 Lesion of lateral popliteal nerve, left lower limb: Secondary | ICD-10-CM | POA: Diagnosis not present

## 2019-08-11 DIAGNOSIS — C61 Malignant neoplasm of prostate: Secondary | ICD-10-CM | POA: Diagnosis not present

## 2019-08-12 DIAGNOSIS — H4720 Unspecified optic atrophy: Secondary | ICD-10-CM | POA: Diagnosis not present

## 2019-08-12 DIAGNOSIS — H353134 Nonexudative age-related macular degeneration, bilateral, advanced atrophic with subfoveal involvement: Secondary | ICD-10-CM | POA: Diagnosis not present

## 2019-08-12 DIAGNOSIS — H353212 Exudative age-related macular degeneration, right eye, with inactive choroidal neovascularization: Secondary | ICD-10-CM | POA: Diagnosis not present

## 2019-08-12 DIAGNOSIS — H353221 Exudative age-related macular degeneration, left eye, with active choroidal neovascularization: Secondary | ICD-10-CM | POA: Diagnosis not present

## 2019-08-18 ENCOUNTER — Other Ambulatory Visit: Payer: Self-pay | Admitting: Urology

## 2019-08-18 ENCOUNTER — Other Ambulatory Visit (HOSPITAL_COMMUNITY): Payer: Self-pay | Admitting: Urology

## 2019-08-18 DIAGNOSIS — C61 Malignant neoplasm of prostate: Secondary | ICD-10-CM

## 2019-09-01 DIAGNOSIS — Z23 Encounter for immunization: Secondary | ICD-10-CM | POA: Diagnosis not present

## 2019-09-05 DIAGNOSIS — J3489 Other specified disorders of nose and nasal sinuses: Secondary | ICD-10-CM | POA: Diagnosis not present

## 2019-09-05 DIAGNOSIS — Z20818 Contact with and (suspected) exposure to other bacterial communicable diseases: Secondary | ICD-10-CM | POA: Diagnosis not present

## 2019-09-08 ENCOUNTER — Ambulatory Visit: Payer: Medicare Other | Admitting: Physician Assistant

## 2019-09-09 ENCOUNTER — Telehealth (INDEPENDENT_AMBULATORY_CARE_PROVIDER_SITE_OTHER): Payer: Medicare Other | Admitting: Physician Assistant

## 2019-09-09 ENCOUNTER — Encounter: Payer: Self-pay | Admitting: Physician Assistant

## 2019-09-09 VITALS — BP 112/62 | HR 92 | Temp 98.2°F | Ht 71.0 in | Wt 173.0 lb

## 2019-09-09 DIAGNOSIS — G4733 Obstructive sleep apnea (adult) (pediatric): Secondary | ICD-10-CM

## 2019-09-09 DIAGNOSIS — I25810 Atherosclerosis of coronary artery bypass graft(s) without angina pectoris: Secondary | ICD-10-CM

## 2019-09-09 DIAGNOSIS — I482 Chronic atrial fibrillation, unspecified: Secondary | ICD-10-CM

## 2019-09-09 DIAGNOSIS — Z9989 Dependence on other enabling machines and devices: Secondary | ICD-10-CM

## 2019-09-09 DIAGNOSIS — Z20822 Contact with and (suspected) exposure to covid-19: Secondary | ICD-10-CM

## 2019-09-09 DIAGNOSIS — Z952 Presence of prosthetic heart valve: Secondary | ICD-10-CM

## 2019-09-09 DIAGNOSIS — E785 Hyperlipidemia, unspecified: Secondary | ICD-10-CM

## 2019-09-09 NOTE — Patient Instructions (Signed)
Medication Instructions:   Your physician recommends that you continue on your current medications as directed. Please refer to the Current Medication list given to you today.  *If you need a refill on your cardiac medications before your next appointment, please call your pharmacy*  Lab Work:  NONE ordered at this time of appointment   If you have labs (blood work) drawn today and your tests are completely normal, you will receive your results only by: [name] MyChart Message (if you have MyChart) OR . A paper copy in the mail If you have any lab test that is abnormal or we need to change your treatment, we will call you to review the results.  Testing/Procedures:  NONE ordered at this time of appointment   Follow-Up: At [facility], you and your health needs are our priority.  As part of our continuing mission to provide you with exceptional heart care, we have created designated Provider Care Teams.  These Care Teams include your primary Cardiologist (physician) and Advanced Practice Providers (APPs -  Physician Assistants and Nurse Practitioners) who all work together to provide you with the care you need, when you need it.  Your next appointment:   6 month(s)  The format for your next appointment:   Either In Person or Virtual  Provider:   Peter Martinique, MD  Other Instructions

## 2019-09-09 NOTE — Progress Notes (Signed)
Virtual Visit via Telephone Note   This visit type was conducted due to national recommendations for restrictions regarding the COVID-19 Pandemic (e.g. social distancing) in an effort to limit this patient's exposure and mitigate transmission in our community.  Due to his co-morbid illnesses, this patient is at least at moderate risk for complications without adequate follow up.  This format is felt to be most appropriate for this patient at this time.  The patient did not have access to video technology/had technical difficulties with video requiring transitioning to audio format only (telephone).  All issues noted in this document were discussed and addressed.  No physical exam could be performed with this format.  Please refer to the patient's chart for his  consent to telehealth for [facility].   Date:  09/11/2019   ID:  Jason [name], DOB [birth year], MRN [id number]  Patient Location: Home Provider Location: Office  PCP:  [name], MD  Cardiologist:  Peter Martinique, MD  Electrophysiologist:  Constance Haw, MD   Evaluation Performed:  Follow-Up Visit  Chief Complaint:  followup  History of Present Illness:    Jason [name] is a 86 y.o. male with past medical history of CAD s/p CABG with AVR in October 2011, history of prostate cancer, hyperlipidemia, OSA on CPAP and chronic atrial fibrillation.  When he had AVR with tissue valve prosthesis, he underwent LIMA to LAD.  At the time, RCA had a 40 to 50% stenosis that was treated medically.  He has been anticoagulated with Xarelto.  He was admitted in January 2016 with GI bleed.  His Xarelto was held.  He did not require blood transfusion.  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  Afterward, he was placed on Coumadin.  He had recurrent anemia in May 2017 and required blood transfusion.  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  Afterward, he returned with complaint of GI bleed.  He was  evaluated by Dr. Curt Bears in March 2018, beta-blocker was increased.  There was also some wide-complex tachycardia consistent with aberrancy versus VT.  He was transition to Eliquis.  He was last seen virtually by Dr. Martinique on 02/05/2019 at which time he was doing well.  He obtains his medication through the [place] system.  Last CEA was mildly elevated in September.  PSA was elevated at 61 in September 2020.  He currently resides in [facility] with his wife.  He was able to obtain the first dose of vaccine on January 4 along with his wife.  Unfortunately, he developed a fever 2 days later.  He was not sure if it is related to the vaccine or something else.  As part of a preventative measure, both him and his wife were tested for Covid.  His test came back negative whereas his wife's test came back positive for COVID-19.  They are currently under 14-day quarantine observation.  Otherwise he denies any significant shortness of breath, fatigue or chest discomfort.  He has no lower extremity edema, orthopnea or PND.  It does not sound like either one of them has significant symptom during Covid, which is thankful.  Overall he is doing quite well from cardiology perspective and can follow-up in 6 months.  The patient does not have symptoms concerning for COVID-19 infection (fever, chills, cough, or new shortness of breath).    Past Medical History:  Diagnosis Date  . Abnormal PSA   . Adenomatous polyp of colon 2010 and  before 2006  . Anemia   . Aortic stenosis   . Aortic valve prosthesis present   . Atrial fibrillation ([place])   . CAD (coronary artery disease)   . DDD (degenerative disc disease), lumbar   . Depression   . Diverticulosis   . Dyslipidemia   . GERD (gastroesophageal reflux disease)   . Glaucoma   . History of blood transfusion 09/29/2016  . Hx of CABG   . Hypogonadism, male   . Macular degeneration   . OSA (obstructive sleep apnea)   . Osteoarthritis   . Osteopenia   .  Prostate CA [facility]) prostate 2007   colon dx 2018, watching psa levels, no tx yet for prostate   Past Surgical History:  Procedure Laterality Date  . AORTIC VALVE REPLACEMENT  October 2011   Magna Ease pericardial tissue valve #70mm  . [place]   lower  . CATARACT EXTRACTION Bilateral   . CORNEAL TRANSPLANT Right   . CORONARY ARTERY BYPASS GRAFT  October 2011   LIMA to LAD  . ESOPHAGOGASTRODUODENOSCOPY N/A 09/16/2014   Procedure: ESOPHAGOGASTRODUODENOSCOPY (EGD);  Surgeon: [name], MD;  Location: [facility] ENDOSCOPY;  Service: Endoscopy;  Laterality: N/A;  . TONSILLECTOMY  76 yrs ago     Current Meds  Medication Sig  . acetaminophen (TYLENOL) 325 MG tablet Take 650 mg by mouth every 6 (six) hours as needed.  [name] amitriptyline (ELAVIL) 25 MG tablet Take 25 mg by mouth daily.  [name] apixaban (ELIQUIS) 5 MG TABS tablet Take 5 mg by mouth 2 (two) times daily.  . brimonidine (ALPHAGAN) 0.2 % ophthalmic solution Place 1 drop into the left eye 2 (two) times daily.  . Calcium Carbonate-Vitamin D (CALCIUM-VITAMIN D) 500-200 MG-UNIT per tablet Take 1 tablet by mouth daily.  . Cholecalciferol 1000 UNITS capsule Take 1,000 Units by mouth daily.   . dorzolamide-timolol (COSOPT) 22.3-6.8 MG/ML ophthalmic solution Place 1 drop into both eyes 2 (two) times daily.  . famotidine (PEPCID) 20 MG tablet Take 20 mg by mouth 2 (two) times daily.  . fluticasone (FLONASE) 50 MCG/ACT nasal spray Place 1 spray into both nostrils daily as needed for allergies or rhinitis.   [name] gabapentin (NEURONTIN) 300 MG capsule Take 300 mg by mouth 3 (three) times daily.  . Glucosamine-Chondroitin-MSM TABS Take by mouth daily.  [name] latanoprost (XALATAN) 0.005 % ophthalmic solution Place 1 drop into both eyes daily.  [name] loperamide (IMODIUM) 2 MG capsule Take 2 mg by mouth as needed for diarrhea or loose stools.  . metoprolol tartrate (LOPRESSOR) 25 MG tablet Take 1 tablet (25 mg total) by mouth 2 (two) times daily.  . Multiple  Vitamin (MULTIVITAMIN WITH MINERALS) TABS tablet Take 1 tablet by mouth daily.  [name] omega-3 acid ethyl esters (LOVAZA) 1 G capsule Take 1 g by mouth daily.   [name] perphenazine (TRILAFON) 4 MG tablet Take 4 mg by mouth daily.  . prednisoLONE acetate (PRED FORTE) 1 % ophthalmic suspension Place 1 drop into the right eye every other day.   . psyllium (METAMUCIL) 58.6 % powder Take 1 packet by mouth daily.  . rosuvastatin (CRESTOR) 5 MG tablet Take 5 mg by mouth every other day.   . vitamin B-12 (CYANOCOBALAMIN) 1000 MCG tablet Take 1,000 mcg by mouth daily.     Allergies:   Patient has no known allergies.   Social History   Tobacco Use  . Smoking status: Former Smoker    Packs/day: 1.00    Years: 40.00  Pack years: 40.00    Types: Cigarettes    Quit date: 11/16/1990    Years since quitting: 28.8  . Smokeless tobacco: Never Used  Substance Use Topics  . Alcohol use: Yes    Alcohol/week: 2.0 standard drinks    Types: 2 Glasses of wine per week    Comment: wine few x per week  . Drug use: No     Family Hx: The patient's family history includes Heart attack in his father. There is no history of Colon cancer, Esophageal cancer, Stomach cancer, or Rectal cancer.  ROS:   Please see the history of present illness.     All other systems reviewed and are negative.   Prior CV studies:   The following studies were reviewed today:  Echo 07/17/2014 LV EF: 55% -  60%   -------------------------------------------------------------------  Indications:   Atrial Fibrillation (I48.1).   -------------------------------------------------------------------  History:  PMH:  Atrial fibrillation. Coronary artery disease.  Risk factors: Aortic Valve Replacement, Prostate Cancer. Family  history of coronary artery disease. Former tobacco use.  Dyslipidemia.   -------------------------------------------------------------------  Study Conclusions   - Left ventricle: The cavity size was  normal. Wall thickness was  increased in a pattern of mild LVH. There was mild concentric  hypertrophy. Systolic function was normal. The estimated ejection  fraction was in the range of 55% to 60%. Wall motion was normal;  there were no regional wall motion abnormalities.  - Ventricular septum: Septal motion showed paradox.  - Aortic valve: A bioprosthesis was present and functioning  normally. Valve area (VTI): 1.6 cm^2. Valve area (Vmax): 1.5  cm^2. Valve area (Vmean): 1.33 cm^2.  - Mitral valve: Calcified, moderately to severely fibrotic annulus.  Mildly thickened leaflets . The findings are consistent with  trivial stenosis. There was mild regurgitation directed  centrally. Valve area by continuity equation (using LVOT flow):  2.6 cm^2.  - Left atrium: The atrium was moderately dilated.  - Right atrium: The atrium was mildly dilated.   Labs/Other Tests and Data Reviewed:    EKG:  An ECG dated 01/30/2018 was personally reviewed today and demonstrated:  Atrial fibrillation  Recent Labs: No results found for requested labs within last 8760 hours.   Recent Lipid Panel Lab Results  Component Value Date/Time   CHOL 89 01/03/2012 08:35 AM   TRIG 125.0 01/03/2012 08:35 AM   HDL 37.40 (L) 01/03/2012 08:35 AM   CHOLHDL 2 01/03/2012 08:35 AM   LDLCALC 27 01/03/2012 08:35 AM    Wt Readings from Last 3 Encounters:  09/09/19 173 lb (78.5 kg)  05/14/19 172 lb 3.2 oz (78.1 kg)  02/05/19 168 lb (76.2 kg)     Objective:    Vital Signs:  BP 112/62   Pulse 92   Temp 98.2 F (36.8 C)   Ht 5\' 11"  (1.803 m)   Wt 173 lb (78.5 kg)   BMI 24.13 kg/m    VITAL SIGNS:  reviewed  ASSESSMENT & PLAN:    1. CAD s/p CABG: Denies any recent anginal symptoms  2. History of AVR: Last echocardiogram was in 2015.  At that time, aortic valve was functioning well.  He denies any significant shortness of breath recently.  3. Chronic atrial fibrillation: Continue Eliquis.  Rate  controlled on current therapy.  4. Hyperlipidemia: Continue Crestor  5. Obstructive sleep apnea: on CPAP therapy.  6. Covid contact: His wife has been tested positive for Covid.  He is currently under quarantine  COVID-19 Education: The signs  and symptoms of COVID-19 were discussed with the patient and how to seek care for testing (follow up with PCP or arrange E-visit).  The importance of social distancing was discussed today.  Time:   Today, I have spent 10 minutes with the patient with telehealth technology discussing the above problems.     Medication Adjustments/Labs and Tests Ordered: Current medicines are reviewed at length with the patient today.  Concerns regarding medicines are outlined above.   Tests Ordered: No orders of the defined types were placed in this encounter.   Medication Changes: No orders of the defined types were placed in this encounter.   Follow Up:  Either In Person or Virtual in 6 month(s)  Signed, Almyra Deforest, [place]  09/11/2019 11:27 PM    [place]

## 2019-09-15 ENCOUNTER — Encounter (HOSPITAL_COMMUNITY): Payer: Medicare Other

## 2019-09-23 DIAGNOSIS — H353221 Exudative age-related macular degeneration, left eye, with active choroidal neovascularization: Secondary | ICD-10-CM | POA: Diagnosis not present

## 2019-09-23 DIAGNOSIS — H3562 Retinal hemorrhage, left eye: Secondary | ICD-10-CM | POA: Diagnosis not present

## 2019-09-23 DIAGNOSIS — H353134 Nonexudative age-related macular degeneration, bilateral, advanced atrophic with subfoveal involvement: Secondary | ICD-10-CM | POA: Diagnosis not present

## 2019-09-23 DIAGNOSIS — H353212 Exudative age-related macular degeneration, right eye, with inactive choroidal neovascularization: Secondary | ICD-10-CM | POA: Diagnosis not present

## 2019-09-29 ENCOUNTER — Other Ambulatory Visit (HOSPITAL_COMMUNITY): Payer: Medicare Other

## 2019-09-29 ENCOUNTER — Encounter (HOSPITAL_COMMUNITY): Payer: Medicare Other

## 2019-09-29 DIAGNOSIS — Z23 Encounter for immunization: Secondary | ICD-10-CM | POA: Diagnosis not present

## 2019-09-30 ENCOUNTER — Other Ambulatory Visit (HOSPITAL_COMMUNITY): Payer: Medicare Other

## 2019-09-30 ENCOUNTER — Ambulatory Visit (HOSPITAL_COMMUNITY): Payer: Medicare Other

## 2019-10-08 ENCOUNTER — Encounter (HOSPITAL_COMMUNITY)
Admission: RE | Admit: 2019-10-08 | Discharge: 2019-10-08 | Disposition: A | Discharge status: Home / Self Care | Payer: Medicare Other | Source: Ambulatory Visit | Attending: Urology | Admitting: Urology

## 2019-10-08 ENCOUNTER — Other Ambulatory Visit: Payer: Self-pay

## 2019-10-08 DIAGNOSIS — C61 Malignant neoplasm of prostate: Secondary | ICD-10-CM | POA: Diagnosis not present

## 2019-10-08 MED ORDER — TECHNETIUM TC 99M MEDRONATE IV KIT
21.5000 | PACK | Freq: Once | INTRAVENOUS | Status: AC
  Administered 2019-10-08: 11:00:00 21.5 via INTRAVENOUS

## 2019-10-17 DIAGNOSIS — C61 Malignant neoplasm of prostate: Secondary | ICD-10-CM | POA: Diagnosis not present

## 2019-11-04 DIAGNOSIS — H353134 Nonexudative age-related macular degeneration, bilateral, advanced atrophic with subfoveal involvement: Secondary | ICD-10-CM | POA: Diagnosis not present

## 2019-11-04 DIAGNOSIS — H401133 Primary open-angle glaucoma, bilateral, severe stage: Secondary | ICD-10-CM | POA: Diagnosis not present

## 2019-11-04 DIAGNOSIS — H3562 Retinal hemorrhage, left eye: Secondary | ICD-10-CM | POA: Diagnosis not present

## 2019-11-04 DIAGNOSIS — H353221 Exudative age-related macular degeneration, left eye, with active choroidal neovascularization: Secondary | ICD-10-CM | POA: Diagnosis not present

## 2019-11-11 ENCOUNTER — Inpatient Hospital Stay: Payer: Medicare Other | Attending: Oncology | Admitting: Oncology

## 2019-11-11 ENCOUNTER — Inpatient Hospital Stay: Payer: Medicare Other

## 2019-11-11 ENCOUNTER — Other Ambulatory Visit: Payer: Self-pay

## 2019-11-11 VITALS — BP 137/86 | HR 70 | Temp 98.3°F | Resp 18 | Ht 71.0 in | Wt 179.3 lb

## 2019-11-11 DIAGNOSIS — C180 Malignant neoplasm of cecum: Secondary | ICD-10-CM | POA: Diagnosis not present

## 2019-11-11 DIAGNOSIS — C779 Secondary and unspecified malignant neoplasm of lymph node, unspecified: Secondary | ICD-10-CM | POA: Insufficient documentation

## 2019-11-11 DIAGNOSIS — Z951 Presence of aortocoronary bypass graft: Secondary | ICD-10-CM | POA: Diagnosis not present

## 2019-11-11 DIAGNOSIS — I2510 Atherosclerotic heart disease of native coronary artery without angina pectoris: Secondary | ICD-10-CM | POA: Insufficient documentation

## 2019-11-11 DIAGNOSIS — Z8546 Personal history of malignant neoplasm of prostate: Secondary | ICD-10-CM | POA: Diagnosis not present

## 2019-11-11 DIAGNOSIS — I25810 Atherosclerosis of coronary artery bypass graft(s) without angina pectoris: Secondary | ICD-10-CM

## 2019-11-11 DIAGNOSIS — I4891 Unspecified atrial fibrillation: Secondary | ICD-10-CM | POA: Diagnosis not present

## 2019-11-11 DIAGNOSIS — Z79899 Other long term (current) drug therapy: Secondary | ICD-10-CM | POA: Insufficient documentation

## 2019-11-11 DIAGNOSIS — Z7901 Long term (current) use of anticoagulants: Secondary | ICD-10-CM | POA: Insufficient documentation

## 2019-11-11 DIAGNOSIS — K5900 Constipation, unspecified: Secondary | ICD-10-CM | POA: Diagnosis not present

## 2019-11-11 DIAGNOSIS — K1230 Oral mucositis (ulcerative), unspecified: Secondary | ICD-10-CM | POA: Diagnosis not present

## 2019-11-11 DIAGNOSIS — H409 Unspecified glaucoma: Secondary | ICD-10-CM | POA: Diagnosis not present

## 2019-11-11 LAB — CEA (IN HOUSE-CHCC): CEA (CHCC-In House): 5.38 ng/mL — ABNORMAL HIGH (ref 0.00–5.00)

## 2019-11-11 NOTE — Progress Notes (Signed)
[facility] OFFICE PROGRESS NOTE   Diagnosis: Colon cancer, prostate cancer  INTERVAL HISTORY:   [name] returns as scheduled.  He feels well.  He continues follow-up with Dr. [name] for prostate cancer.  He underwent a CT of the abdomen and pelvis on 10/17/2019.  The prostate is enlarged.  No evidence of metastatic disease.  A bone scan on 10/08/2019 revealed a subtle area of increased uptake at the left femur and a questionable new focus of uptake at a posterior left rib. He has received the COVID-19 vaccine.  Good appetite.  He reports intermittent constipation. Objective:  Vital signs in last 24 hours:  Blood pressure 137/86, pulse 70, temperature 98.3 F (36.8 C), temperature source Temporal, resp. rate 18, height _0  (1.803 m), weight 179 lb 4.8 oz (81.3 kg), SpO2 98 %.    Lymphatics: No cervical, supraclavicular, axillary, or inguinal nodes GI: Nontender, no mass, no hepatosplenomegaly Vascular: No leg edema     Lab Results:  Lab Results  Component Value Date   WBC 5.0 05/09/2017   HGB 13.7 05/09/2017   HCT 40.7 05/09/2017   MCV 109.4 (H) 05/09/2017   PLT 143 05/09/2017   NEUTROABS 2.8 05/09/2017    CMP  Lab Results  Component Value Date   NA 139 04/11/2017   K 3.8 04/11/2017   CL 104 11/01/2016   CO2 27 04/11/2017   GLUCOSE 97 04/11/2017   BUN 13.7 04/11/2017   CREATININE 1.0 04/11/2017   CALCIUM 9.4 04/11/2017   PROT 7.5 04/11/2017   ALBUMIN 3.9 04/11/2017   AST 22 04/11/2017   ALT 19 04/11/2017   ALKPHOS 54 04/11/2017   BILITOT 0.91 04/11/2017   GFRNONAA >60 11/01/2016   GFRAA >60 11/01/2016    Lab Results  Component Value Date   CEA1 5.52 (H) 05/14/2019     Medications: I have reviewed the patient's current medications.   Assessment/Plan: 1. Colon cancer, cecum, stage IIIB (T4a,N1b), status post a right colectomy 10/24/2016  3/23 lymph nodes positive for metastatic adenocarcinoma  MSI-stable, no loss of mismatch  repair protein expression  Cycle 1 adjuvant Xeloda 11/20/2016  Cycle 2 adjuvant Xeloda 12/12/2016 (dose reduced due to mucositis following cycle 1)  Cycle 3 adjuvant Xeloda 01/02/2017  Cycle 4 adjuvant Xeloda 01/23/2017  Cycle 5 adjuvant Xeloda 02/13/2017  Cycle 6 adjuvant Xeloda 03/06/2017  Cycle 7 adjuvant Xeloda 03/27/2017  Cycle 8 adjuvant Xeloda 04/17/2017  Colonoscopy on 03/11/2018-3 mm polyp in the descending colon (tubular adenoma, no high-grade dysplasia)  CT abdomen/pelvis 10/17/2019-no evidence of metastatic disease  2. History of iron deficiency anemia secondary to #1, status post IV iron therapy and multiple red cell transfusions; hemoglobin improved 11/16/2016;hemoglobin normal range 01/17/2017  3. Prostate cancer followed by urology with observation  Bone scan 04/24/2018- no change from comparison exam 10/19/2017.  No evidence of skeletal metastasis.  Bone scan 10/08/2019-questionable new focus of uptake and a posterior left rib, otherwise stable  4. History of coronary artery disease, status post coronary artery bypass surgery 2011  5. Aortic valve replacement 2011  6. Atrial fibrillation-previously maintained on Coumadin;switched to Eliquisdue to known interaction between Coumadin and Xeloda  7. Glaucoma  8. Mucositis secondary to Xeloda following cycle 1; Xeloda dose reduced beginning with cycle 2.     Disposition: [name] is in remission from colon cancer.  He appears asymptomatic from prostate cancer and remains off of specific therapy.  He continues clinical follow-up with Dr. [name]. We will follow up on  the CEA from today.  The mild elevation of the CEA may be related to prostate cancer.  He will return for an office visit and CEA in 8 months.  Jason Coder, MD  11/11/2019  12:10 PM

## 2019-11-12 ENCOUNTER — Telehealth: Payer: Self-pay | Admitting: *Deleted

## 2019-11-12 NOTE — Telephone Encounter (Signed)
-----   Message from Ladell Pier, MD sent at 11/11/2019  8:10 PM EDT ----- Please call patient, CEA is stable, follow-up as scheduled

## 2019-11-12 NOTE — Telephone Encounter (Signed)
Notified of message below

## 2019-12-24 DIAGNOSIS — Z947 Corneal transplant status: Secondary | ICD-10-CM | POA: Diagnosis not present

## 2019-12-24 DIAGNOSIS — H401113 Primary open-angle glaucoma, right eye, severe stage: Secondary | ICD-10-CM | POA: Diagnosis not present

## 2019-12-24 DIAGNOSIS — H31013 Macula scars of posterior pole (postinflammatory) (post-traumatic), bilateral: Secondary | ICD-10-CM | POA: Diagnosis not present

## 2019-12-24 DIAGNOSIS — H401123 Primary open-angle glaucoma, left eye, severe stage: Secondary | ICD-10-CM | POA: Diagnosis not present

## 2019-12-29 DIAGNOSIS — H3562 Retinal hemorrhage, left eye: Secondary | ICD-10-CM | POA: Insufficient documentation

## 2019-12-29 DIAGNOSIS — H353134 Nonexudative age-related macular degeneration, bilateral, advanced atrophic with subfoveal involvement: Secondary | ICD-10-CM | POA: Insufficient documentation

## 2019-12-29 DIAGNOSIS — H353221 Exudative age-related macular degeneration, left eye, with active choroidal neovascularization: Secondary | ICD-10-CM | POA: Insufficient documentation

## 2019-12-29 DIAGNOSIS — H4720 Unspecified optic atrophy: Secondary | ICD-10-CM | POA: Insufficient documentation

## 2019-12-29 DIAGNOSIS — H353222 Exudative age-related macular degeneration, left eye, with inactive choroidal neovascularization: Secondary | ICD-10-CM | POA: Insufficient documentation

## 2019-12-29 DIAGNOSIS — H401133 Primary open-angle glaucoma, bilateral, severe stage: Secondary | ICD-10-CM | POA: Insufficient documentation

## 2019-12-29 DIAGNOSIS — H353212 Exudative age-related macular degeneration, right eye, with inactive choroidal neovascularization: Secondary | ICD-10-CM | POA: Insufficient documentation

## 2019-12-30 ENCOUNTER — Encounter (INDEPENDENT_AMBULATORY_CARE_PROVIDER_SITE_OTHER): Payer: Self-pay | Admitting: Ophthalmology

## 2019-12-30 ENCOUNTER — Other Ambulatory Visit: Payer: Self-pay

## 2019-12-30 ENCOUNTER — Ambulatory Visit (INDEPENDENT_AMBULATORY_CARE_PROVIDER_SITE_OTHER): Payer: Medicare Other | Admitting: Ophthalmology

## 2019-12-30 DIAGNOSIS — Z947 Corneal transplant status: Secondary | ICD-10-CM | POA: Insufficient documentation

## 2019-12-30 DIAGNOSIS — H353221 Exudative age-related macular degeneration, left eye, with active choroidal neovascularization: Secondary | ICD-10-CM

## 2019-12-30 DIAGNOSIS — H3562 Retinal hemorrhage, left eye: Secondary | ICD-10-CM

## 2019-12-30 DIAGNOSIS — H353212 Exudative age-related macular degeneration, right eye, with inactive choroidal neovascularization: Secondary | ICD-10-CM

## 2019-12-30 DIAGNOSIS — H4720 Unspecified optic atrophy: Secondary | ICD-10-CM | POA: Diagnosis not present

## 2019-12-30 DIAGNOSIS — H353134 Nonexudative age-related macular degeneration, bilateral, advanced atrophic with subfoveal involvement: Secondary | ICD-10-CM

## 2019-12-30 DIAGNOSIS — H401133 Primary open-angle glaucoma, bilateral, severe stage: Secondary | ICD-10-CM

## 2019-12-30 MED ORDER — BEVACIZUMAB CHEMO INJECTION 1.25MG/0.05ML SYRINGE FOR KALEIDOSCOPE
1.2500 mg | INTRAVITREAL | Status: AC | PRN
  Administered 2019-12-30: 11:00:00 1.25 mg via INTRAVITREAL

## 2019-12-30 NOTE — Progress Notes (Signed)
12/30/2019     CHIEF COMPLAINT Patient presents for Retina Follow Up   HISTORY OF PRESENT ILLNESS: Jason [name] is a 86 y.o. male who presents to the clinic today for:   HPI    Retina Follow Up    Patient presents with  Wet AMD.  In left eye.  Duration of 8 weeks.  Since onset it is gradually worsening.          Comments    8 week follow up - OCT OU, Possible Avastin OS Patient states that he thinks his vision has gradually gotten worse.       Last edited by Gerda Diss on 12/30/2019  9:51 AM. (History)      Referring physician: [name], MD [place],  [place] [address]  HISTORICAL INFORMATION:   Selected notes from the MEDICAL RECORD NUMBER    Lab Results  Component Value Date   HGBA1C 5.5 10/17/2016     CURRENT MEDICATIONS: Current Outpatient Medications (Ophthalmic Drugs)  Medication Sig  . brimonidine (ALPHAGAN) 0.2 % ophthalmic solution Place 1 drop into the left eye 3 (three) times daily.   . dorzolamide-timolol (COSOPT) 22.3-6.8 MG/ML ophthalmic solution Place 1 drop into both eyes 2 (two) times daily.  [name] latanoprost (XALATAN) 0.005 % ophthalmic solution Place 1 drop into both eyes daily.  . prednisoLONE acetate (PRED FORTE) 1 % ophthalmic suspension Place 1 drop into the right eye every other day.    No current facility-administered medications for this visit. (Ophthalmic Drugs)   Current Outpatient Medications (Other)  Medication Sig  . acetaminophen (TYLENOL) 325 MG tablet Take 650 mg by mouth every 6 (six) hours as needed.  [name] amitriptyline (ELAVIL) 25 MG tablet Take 25 mg by mouth daily.  [name] apixaban (ELIQUIS) 5 MG TABS tablet Take 5 mg by mouth 2 (two) times daily.  . Calcium Carbonate-Vitamin D (CALCIUM-VITAMIN D) 500-200 MG-UNIT per tablet Take 1 tablet by mouth daily.  . Cholecalciferol 1000 UNITS capsule Take 1,000 Units by mouth daily.   . famotidine (PEPCID) 20 MG tablet Take 20 mg by mouth 2 (two) times daily.  .  fluticasone (FLONASE) 50 MCG/ACT nasal spray Place 1 spray into both nostrils daily as needed for allergies or rhinitis.   [name] gabapentin (NEURONTIN) 300 MG capsule Take 300 mg by mouth 3 (three) times daily.  . Glucosamine-Chondroitin-MSM TABS Take by mouth daily.  . metoprolol tartrate (LOPRESSOR) 25 MG tablet Take 1 tablet (25 mg total) by mouth 2 (two) times daily.  . Multiple Vitamin (MULTIVITAMIN WITH MINERALS) TABS tablet Take 1 tablet by mouth daily.  [name] omega-3 acid ethyl esters (LOVAZA) 1 G capsule Take 1 g by mouth daily.   [name] perphenazine (TRILAFON) 4 MG tablet Take 4 mg by mouth daily.  . psyllium (METAMUCIL) 58.6 % powder Take 1 packet by mouth daily.  . rosuvastatin (CRESTOR) 5 MG tablet Take 5 mg by mouth every other day.   . vitamin B-12 (CYANOCOBALAMIN) 1000 MCG tablet Take 1,000 mcg by mouth daily.   No current facility-administered medications for this visit. (Other)      REVIEW OF SYSTEMS:    ALLERGIES No Known Allergies  PAST MEDICAL HISTORY Past Medical History:  Diagnosis Date  . Abnormal PSA   . Adenomatous polyp of colon 2010 and before 2006  . Anemia   . Aortic stenosis   . Aortic valve prosthesis present   . Atrial fibrillation ([place])   . CAD (coronary artery disease)   .  DDD (degenerative disc disease), lumbar   . Depression   . Diverticulosis   . Dyslipidemia   . GERD (gastroesophageal reflux disease)   . Glaucoma   . History of blood transfusion 09/29/2016  . Hx of CABG   . Hypogonadism, male   . Macular degeneration   . OSA (obstructive sleep apnea)   . Osteoarthritis   . Osteopenia   . Prostate CA [place]) prostate 2007   colon dx 2018, watching psa levels, no tx yet for prostate   Past Surgical History:  Procedure Laterality Date  . AORTIC VALVE REPLACEMENT  October 2011   Magna Ease pericardial tissue valve #8mm  . [place]   lower  . CATARACT EXTRACTION Bilateral   . CORNEAL TRANSPLANT Right   . CORONARY ARTERY BYPASS  GRAFT  October 2011   LIMA to LAD  . ESOPHAGOGASTRODUODENOSCOPY N/A 09/16/2014   Procedure: ESOPHAGOGASTRODUODENOSCOPY (EGD);  Surgeon: [name], MD;  Location: [facility] ENDOSCOPY;  Service: Endoscopy;  Laterality: N/A;  . TONSILLECTOMY  76 yrs ago    FAMILY HISTORY Family History  Problem Relation Age of Onset  . Heart attack Father   . Colon cancer Neg Hx   . Esophageal cancer Neg Hx   . Stomach cancer Neg Hx   . Rectal cancer Neg Hx     SOCIAL HISTORY Social History   Tobacco Use  . Smoking status: Former Smoker    Packs/day: 1.00    Years: 40.00    Pack years: 40.00    Types: Cigarettes    Quit date: 11/16/1990    Years since quitting: 29.1  . Smokeless tobacco: Never Used  Substance Use Topics  . Alcohol use: Yes    Alcohol/week: 2.0 standard drinks    Types: 2 Glasses of wine per week    Comment: wine few x per week  . Drug use: No         OPHTHALMIC EXAM:  Base Eye Exam    Visual Acuity (Snellen - Linear)      Right Left   Dist cc 20/400 CF @ 3'   Dist ph cc NI NI   Correction: Glasses       Tonometry (Tonopen, 9:59 AM)      Right Left   Pressure 12 12       Pupils      Pupils Dark Light Shape React APD   Right PERRL 4 3 Round Minimal None   Left PERRL 4 3 Round Minimal None       Visual Fields (Counting fingers)      Left Right   Restrictions Partial outer inferior nasal deficiency Partial outer superior temporal, inferior temporal, superior nasal, inferior nasal deficiencies       Extraocular Movement      Right Left    Full Full       Neuro/Psych    Oriented x3: Yes   Mood/Affect: Normal       Dilation    Both eyes: 1.0% Mydriacyl, 2.5% Phenylephrine @ 9:59 AM        Slit Lamp and Fundus Exam    External Exam      Right Left   External Normal Normal       Slit Lamp Exam      Right Left   Lids/Lashes Normal Normal   Conjunctiva/Sclera White and quiet White and quiet   Cornea Clear graft with nearly 360 degrees peripheral  anterior stromal neovascularization up to the graft  margin no cells on the endothelium Clear   Anterior Chamber Deep and quiet Deep and quiet   Iris Round and reactive Round and reactive   Lens Posterior chamber intraocular lens Posterior chamber intraocular lens   Anterior Vitreous Normal Normal       Fundus Exam      Right Left   Posterior Vitreous Posterior vitreous detachment Normal   Disc Normal Peripapillary atrophy   C/D Ratio 0.7 0.7   Macula Retinal pigment epithelial mottling, Disciform scar, Hard drusen, Advanced age related macular degeneration, Geographic atrophy Retinal pigment epithelial mottling, Disciform scar, Hard drusen, Advanced age related macular degeneration, Geographic atrophy   Vessels Normal Normal   Periphery Normal Normal          IMAGING AND PROCEDURES  Imaging and Procedures for 12/30/19  OCT, Retina - OU - Both Eyes       Right Eye Quality was good. Scan locations included subfoveal. Central Foveal Thickness: 136. Findings include abnormal foveal contour, subretinal scarring, central retinal atrophy, outer retinal atrophy.   Left Eye Quality was good. Scan locations included subfoveal. Central Foveal Thickness: 280. Findings include abnormal foveal contour, outer retinal atrophy, central retinal atrophy, subretinal scarring.   Notes OS, now 8 weeks status post recent intravitreal Avastin for region superior to the foveal region active CNVM now stable  OD  foveal atrophy otherwise stable       Intravitreal Injection, Pharmacologic Agent - OS - Left Eye       Time Out 12/30/2019. 11:14 AM. Confirmed correct patient, procedure, site, and patient consented.   Anesthesia Topical anesthesia was used. Anesthetic medications included [place] 3.5%.   Procedure Preparation included Ofloxacin , 10% betadine to eyelids. A 30 gauge needle was used.   Injection:  1.25 mg Bevacizumab (AVASTIN) SOLN   NDC: [id number], [id number]   Route:  Intravitreal, Site: Left Eye, Waste: 0 mg  Post-op Post injection exam found visual acuity of at least counting fingers. The patient tolerated the procedure well. There were no complications. The patient received written and verbal post procedure care education. Post injection medications were not given.                 ASSESSMENT/PLAN:  Exudative age-related macular degeneration of left eye with active choroidal neovascularization (HCC) The nature of wet macular degeneration was discussed with the patient.  Forms of therapy reviewed include the use of Anti-VEGF medications injected painlessly into the eye, as well as other possible treatment modalities, including thermal laser therapy. Fellow eye involvement and risks were discussed with the patient. Upon the finding of wet age related macular degeneration, treatment will be offered. The treatment regimen is on a treat as needed basis with the intent to treat if necessary and extend interval of exams when possible. On average 1 out of 6 patients do not need lifetime therapy. However, the risk of recurrent disease is high for a lifetime.  Initially monthly, then periodic, examinations and evaluations will determine whether the next treatment is required on the day of the examination.  OS with a region superior to the foveal region active CNVM edges maintain stability week interval.  We will repeat Avastin today and exam in 10 weeks  Advanced nonexudative age-related macular degeneration of both eyes with subfoveal involvement The nature of dry age related macular degeneration was discussed with the patient as well as its possible conversion to wet. The results of the AREDS 2 study was discussed with the patient. A diet  rich in dark leafy green vegetables was advised and specific recommendations were made regarding supplements with AREDS 2 formulation . Control of hypertension and serum cholesterol may slow the disease. Smoking cessation is  mandatory to slow the disease and diminish the risk of progressing to wet age related macular degeneration. The patient was instructed in the use of an [place] and was told to return immediately for any changes in the Grid. Stressed to the patient do not rub eyes  OU, with dry atrophic changes accounting for the vision.      ICD-10-CM   1. Exudative age-related macular degeneration of left eye with active choroidal neovascularization (HCC)  H35.3221 OCT, Retina - OU - Both Eyes    Intravitreal Injection, Pharmacologic Agent - OS - Left Eye    Bevacizumab (AVASTIN) SOLN 1.25 mg  2. Retinal hemorrhage of left eye  H35.62   3. Exudative age-related macular degeneration of right eye with inactive choroidal neovascularization ([place])  H35.3212   4. Advanced nonexudative age-related macular degeneration of both eyes with subfoveal involvement  H35.3134   5. Optic nerve atrophy  H47.20   6. Primary open angle glaucoma of both eyes, severe stage  H40.1133   7. History of corneal transplant  Z94.7     1.OS with a region superior to the foveal region active CNVM edges maintain stability week interval.  We will repeat Avastin today and exam in 10 weeks  2.  3.  Ophthalmic Meds Ordered this visit:  Meds ordered this encounter  Medications  . Bevacizumab (AVASTIN) SOLN 1.25 mg       Return in about 10 weeks (around 03/09/2020) for AVASTIN OCT, OS.  Patient Instructions  Patient is recommended to follow-up with Dr. Marilynne Halsted corneal evaluation to recheck extraction of the corneal neovascularization right eye    Explained the diagnoses, plan, and follow up with the patient and they expressed understanding.  Patient expressed understanding of the importance of proper follow up care.   Clent Demark Liberato Stansbery M.D. Diseases & Surgery of the Retina and Vitreous [facility] [place] 12/30/19     Abbreviations: M myopia (nearsighted); A astigmatism; H hyperopia (farsighted); P  presbyopia; Mrx spectacle prescription;  CTL contact lenses; OD right eye; OS left eye; OU both eyes  XT exotropia; ET esotropia; PEK punctate epithelial keratitis; PEE punctate epithelial erosions; DES dry eye syndrome; MGD meibomian gland dysfunction; ATs artificial tears; PFAT's preservative free artificial tears; [place] nuclear sclerotic cataract; PSC posterior subcapsular cataract; ERM epi-retinal membrane; PVD posterior vitreous detachment; RD retinal detachment; DM diabetes mellitus; DR diabetic retinopathy; NPDR non-proliferative diabetic retinopathy; PDR proliferative diabetic retinopathy; CSME clinically significant macular edema; DME diabetic macular edema; dbh dot blot hemorrhages; CWS cotton wool spot; POAG primary open angle glaucoma; C/D cup-to-disc ratio; HVF humphrey visual field; GVF goldmann visual field; OCT optical coherence tomography; IOP intraocular pressure; BRVO Branch retinal vein occlusion; CRVO central retinal vein occlusion; CRAO central retinal artery occlusion; BRAO branch retinal artery occlusion; RT retinal tear; SB scleral buckle; PPV pars plana vitrectomy; VH Vitreous hemorrhage; PRP panretinal laser photocoagulation; IVK intravitreal kenalog; VMT vitreomacular traction; MH Macular hole;  NVD neovascularization of the disc; NVE neovascularization elsewhere; AREDS age related eye disease study; ARMD age related macular degeneration; POAG primary open angle glaucoma; EBMD epithelial/anterior basement membrane dystrophy; ACIOL anterior chamber intraocular lens; IOL intraocular lens; PCIOL posterior chamber intraocular lens; Phaco/IOL phacoemulsification with intraocular lens placement; PRK photorefractive keratectomy; LASIK laser assisted in situ keratomileusis; HTN hypertension; DM  diabetes mellitus; COPD chronic obstructive pulmonary disease

## 2019-12-30 NOTE — Patient Instructions (Signed)
Patient is recommended to follow-up with Dr. Marilynne Halsted corneal evaluation to recheck extraction of the corneal neovascularization right eye

## 2019-12-30 NOTE — Assessment & Plan Note (Signed)
The nature of dry age related macular degeneration was discussed with the patient as well as its possible conversion to wet. The results of the AREDS 2 study was discussed with the patient. A diet rich in dark leafy green vegetables was advised and specific recommendations were made regarding supplements with AREDS 2 formulation . Control of hypertension and serum cholesterol may slow the disease. Smoking cessation is mandatory to slow the disease and diminish the risk of progressing to wet age related macular degeneration. The patient was instructed in the use of an [place] and was told to return immediately for any changes in the Grid. Stressed to the patient do not rub eyes  OU, with dry atrophic changes accounting for the vision.

## 2019-12-30 NOTE — Assessment & Plan Note (Signed)
The nature of wet macular degeneration was discussed with the patient.  Forms of therapy reviewed include the use of Anti-VEGF medications injected painlessly into the eye, as well as other possible treatment modalities, including thermal laser therapy. Fellow eye involvement and risks were discussed with the patient. Upon the finding of wet age related macular degeneration, treatment will be offered. The treatment regimen is on a treat as needed basis with the intent to treat if necessary and extend interval of exams when possible. On average 1 out of 6 patients do not need lifetime therapy. However, the risk of recurrent disease is high for a lifetime.  Initially monthly, then periodic, examinations and evaluations will determine whether the next treatment is required on the day of the examination.  OS with a region superior to the foveal region active CNVM edges maintain stability week interval.  We will repeat Avastin today and exam in 10 weeks

## 2020-01-05 DIAGNOSIS — M859 Disorder of bone density and structure, unspecified: Secondary | ICD-10-CM | POA: Diagnosis not present

## 2020-01-05 DIAGNOSIS — Z125 Encounter for screening for malignant neoplasm of prostate: Secondary | ICD-10-CM | POA: Diagnosis not present

## 2020-01-05 DIAGNOSIS — R7301 Impaired fasting glucose: Secondary | ICD-10-CM | POA: Diagnosis not present

## 2020-01-05 DIAGNOSIS — E538 Deficiency of other specified B group vitamins: Secondary | ICD-10-CM | POA: Diagnosis not present

## 2020-01-05 DIAGNOSIS — E7849 Other hyperlipidemia: Secondary | ICD-10-CM | POA: Diagnosis not present

## 2020-01-09 DIAGNOSIS — Z947 Corneal transplant status: Secondary | ICD-10-CM | POA: Diagnosis not present

## 2020-01-09 DIAGNOSIS — H401123 Primary open-angle glaucoma, left eye, severe stage: Secondary | ICD-10-CM | POA: Diagnosis not present

## 2020-01-09 DIAGNOSIS — Z961 Presence of intraocular lens: Secondary | ICD-10-CM | POA: Diagnosis not present

## 2020-01-09 DIAGNOSIS — H401113 Primary open-angle glaucoma, right eye, severe stage: Secondary | ICD-10-CM | POA: Diagnosis not present

## 2020-01-09 DIAGNOSIS — H16403 Unspecified corneal neovascularization, bilateral: Secondary | ICD-10-CM | POA: Diagnosis not present

## 2020-01-12 DIAGNOSIS — C180 Malignant neoplasm of cecum: Secondary | ICD-10-CM | POA: Diagnosis not present

## 2020-01-12 DIAGNOSIS — F329 Major depressive disorder, single episode, unspecified: Secondary | ICD-10-CM | POA: Diagnosis not present

## 2020-01-12 DIAGNOSIS — I739 Peripheral vascular disease, unspecified: Secondary | ICD-10-CM | POA: Diagnosis not present

## 2020-01-12 DIAGNOSIS — E785 Hyperlipidemia, unspecified: Secondary | ICD-10-CM | POA: Diagnosis not present

## 2020-01-12 DIAGNOSIS — C61 Malignant neoplasm of prostate: Secondary | ICD-10-CM | POA: Diagnosis not present

## 2020-01-12 DIAGNOSIS — Z0000 Encounter for general adult medical examination without abnormal findings: Secondary | ICD-10-CM | POA: Diagnosis not present

## 2020-01-12 DIAGNOSIS — I480 Paroxysmal atrial fibrillation: Secondary | ICD-10-CM | POA: Diagnosis not present

## 2020-01-12 DIAGNOSIS — Z952 Presence of prosthetic heart valve: Secondary | ICD-10-CM | POA: Diagnosis not present

## 2020-01-12 DIAGNOSIS — R82998 Other abnormal findings in urine: Secondary | ICD-10-CM | POA: Diagnosis not present

## 2020-01-12 DIAGNOSIS — Z7901 Long term (current) use of anticoagulants: Secondary | ICD-10-CM | POA: Diagnosis not present

## 2020-01-12 DIAGNOSIS — E538 Deficiency of other specified B group vitamins: Secondary | ICD-10-CM | POA: Diagnosis not present

## 2020-01-12 DIAGNOSIS — H409 Unspecified glaucoma: Secondary | ICD-10-CM | POA: Diagnosis not present

## 2020-01-12 DIAGNOSIS — D6869 Other thrombophilia: Secondary | ICD-10-CM | POA: Diagnosis not present

## 2020-02-12 DIAGNOSIS — C61 Malignant neoplasm of prostate: Secondary | ICD-10-CM | POA: Diagnosis not present

## 2020-03-09 ENCOUNTER — Ambulatory Visit (INDEPENDENT_AMBULATORY_CARE_PROVIDER_SITE_OTHER): Payer: Medicare Other | Admitting: Ophthalmology

## 2020-03-09 ENCOUNTER — Other Ambulatory Visit: Payer: Self-pay

## 2020-03-09 ENCOUNTER — Encounter (INDEPENDENT_AMBULATORY_CARE_PROVIDER_SITE_OTHER): Payer: Self-pay | Admitting: Ophthalmology

## 2020-03-09 DIAGNOSIS — H353221 Exudative age-related macular degeneration, left eye, with active choroidal neovascularization: Secondary | ICD-10-CM

## 2020-03-09 DIAGNOSIS — Z947 Corneal transplant status: Secondary | ICD-10-CM

## 2020-03-09 DIAGNOSIS — I25810 Atherosclerosis of coronary artery bypass graft(s) without angina pectoris: Secondary | ICD-10-CM

## 2020-03-09 DIAGNOSIS — H353212 Exudative age-related macular degeneration, right eye, with inactive choroidal neovascularization: Secondary | ICD-10-CM | POA: Diagnosis not present

## 2020-03-09 DIAGNOSIS — H353134 Nonexudative age-related macular degeneration, bilateral, advanced atrophic with subfoveal involvement: Secondary | ICD-10-CM | POA: Diagnosis not present

## 2020-03-09 DIAGNOSIS — H401133 Primary open-angle glaucoma, bilateral, severe stage: Secondary | ICD-10-CM

## 2020-03-09 NOTE — Progress Notes (Signed)
03/09/2020     CHIEF COMPLAINT Patient presents for Retina Follow Up   HISTORY OF PRESENT ILLNESS: Jason [name] is a 86 y.o. male who presents to the clinic today for:   HPI    Retina Follow Up    Patient presents with  Wet AMD.  In left eye.  Duration of 10 weeks.  Since onset it is gradually worsening.          Comments    10 week follow up - OCT OU, Poss Avastin OS Patient states he thinks he has lost vision in his right eye. No other complaints.        Last edited by Gerda Diss on 03/09/2020  9:55 AM. (History)      Referring physician: [name], MD [place],  [place] [address]  HISTORICAL INFORMATION:   Selected notes from the MEDICAL RECORD NUMBER    Lab Results  Component Value Date   HGBA1C 5.5 10/17/2016     CURRENT MEDICATIONS: Current Outpatient Medications (Ophthalmic Drugs)  Medication Sig  . brimonidine (ALPHAGAN) 0.2 % ophthalmic solution Place 1 drop into the left eye 3 (three) times daily.   . dorzolamide-timolol (COSOPT) 22.3-6.8 MG/ML ophthalmic solution Place 1 drop into both eyes 2 (two) times daily.  [name] latanoprost (XALATAN) 0.005 % ophthalmic solution Place 1 drop into both eyes daily.  . prednisoLONE acetate (PRED FORTE) 1 % ophthalmic suspension Place 1 drop into the right eye every other day.    No current facility-administered medications for this visit. (Ophthalmic Drugs)   Current Outpatient Medications (Other)  Medication Sig  . acetaminophen (TYLENOL) 325 MG tablet Take 650 mg by mouth every 6 (six) hours as needed.  [name] amitriptyline (ELAVIL) 25 MG tablet Take 25 mg by mouth daily.  [name] apixaban (ELIQUIS) 5 MG TABS tablet Take 5 mg by mouth 2 (two) times daily.  . Calcium Carbonate-Vitamin D (CALCIUM-VITAMIN D) 500-200 MG-UNIT per tablet Take 1 tablet by mouth daily.  . Cholecalciferol 1000 UNITS capsule Take 1,000 Units by mouth daily.   . famotidine (PEPCID) 20 MG tablet Take 20 mg by mouth 2 (two) times  daily.  . fluticasone (FLONASE) 50 MCG/ACT nasal spray Place 1 spray into both nostrils daily as needed for allergies or rhinitis.   [name] gabapentin (NEURONTIN) 300 MG capsule Take 300 mg by mouth 3 (three) times daily.  . Glucosamine-Chondroitin-MSM TABS Take by mouth daily.  . metoprolol tartrate (LOPRESSOR) 25 MG tablet Take 1 tablet (25 mg total) by mouth 2 (two) times daily.  . Multiple Vitamin (MULTIVITAMIN WITH MINERALS) TABS tablet Take 1 tablet by mouth daily.  [name] omega-3 acid ethyl esters (LOVAZA) 1 G capsule Take 1 g by mouth daily.   [name] perphenazine (TRILAFON) 4 MG tablet Take 4 mg by mouth daily.  . psyllium (METAMUCIL) 58.6 % powder Take 1 packet by mouth daily.  . rosuvastatin (CRESTOR) 5 MG tablet Take 5 mg by mouth every other day.   . vitamin B-12 (CYANOCOBALAMIN) 1000 MCG tablet Take 1,000 mcg by mouth daily.   No current facility-administered medications for this visit. (Other)      REVIEW OF SYSTEMS:    ALLERGIES No Known Allergies  PAST MEDICAL HISTORY Past Medical History:  Diagnosis Date  . Abnormal PSA   . Adenomatous polyp of colon 2010 and before 2006  . Anemia   . Aortic stenosis   . Aortic valve prosthesis present   . Atrial fibrillation ([place])   .  CAD (coronary artery disease)   . DDD (degenerative disc disease), lumbar   . Depression   . Diverticulosis   . Dyslipidemia   . GERD (gastroesophageal reflux disease)   . Glaucoma   . History of blood transfusion 09/29/2016  . Hx of CABG   . Hypogonadism, male   . Macular degeneration   . OSA (obstructive sleep apnea)   . Osteoarthritis   . Osteopenia   . Prostate CA [facility]) prostate 2007   colon dx 2018, watching psa levels, no tx yet for prostate   Past Surgical History:  Procedure Laterality Date  . AORTIC VALVE REPLACEMENT  October 2011   Magna Ease pericardial tissue valve #49mm  . [place]   lower  . CATARACT EXTRACTION Bilateral   . CORNEAL TRANSPLANT Right   . CORONARY ARTERY  BYPASS GRAFT  October 2011   LIMA to LAD  . ESOPHAGOGASTRODUODENOSCOPY N/A 09/16/2014   Procedure: ESOPHAGOGASTRODUODENOSCOPY (EGD);  Surgeon: [name], MD;  Location: [facility] ENDOSCOPY;  Service: Endoscopy;  Laterality: N/A;  . TONSILLECTOMY  76 yrs ago    FAMILY HISTORY Family History  Problem Relation Age of Onset  . Heart attack Father   . Colon cancer Neg Hx   . Esophageal cancer Neg Hx   . Stomach cancer Neg Hx   . Rectal cancer Neg Hx     SOCIAL HISTORY Social History   Tobacco Use  . Smoking status: Former Smoker    Packs/day: 1.00    Years: 40.00    Pack years: 40.00    Types: Cigarettes    Quit date: 11/16/1990    Years since quitting: 29.3  . Smokeless tobacco: Never Used  Vaping Use  . Vaping Use: Never used  Substance Use Topics  . Alcohol use: Yes    Alcohol/week: 2.0 standard drinks    Types: 2 Glasses of wine per week    Comment: wine few x per week  . Drug use: No         OPHTHALMIC EXAM:  Base Eye Exam    Visual Acuity (Snellen - Linear)      Right Left   Dist cc CF @ 4' CF @ 4'   Dist ph cc NI NI       Tonometry (Tonopen, 9:59 AM)      Right Left   Pressure 8 11       Pupils      Pupils Dark Light Shape React APD   Right PERRL 4 3 Round Minimal None   Left PERRL 4 3 Round Minimal None       Visual Fields (Counting fingers)      Left Right   Restrictions Partial outer inferior nasal deficiency Partial outer superior temporal, inferior temporal, superior nasal, inferior nasal deficiencies       Extraocular Movement      Right Left    Full Full       Neuro/Psych    Oriented x3: Yes   Mood/Affect: Normal       Dilation    Left eye: 1.0% Mydriacyl, 2.5% Phenylephrine @ 9:59 AM        Slit Lamp and Fundus Exam    External Exam      Right Left   External Normal Normal       Slit Lamp Exam      Right Left   Lids/Lashes Normal Normal   Conjunctiva/Sclera White and quiet White and quiet   Cornea Clear  Clear   Anterior  Chamber Deep and quiet Deep and quiet   Iris Round and reactive Round and reactive   Lens Posterior chamber intraocular lens Posterior chamber intraocular lens   Anterior Vitreous Normal Normal       Fundus Exam      Right Left   Posterior Vitreous Posterior vitreous detachment Normal   Disc hazy view thru ant. segment, Thin rim, Peripapillary atrophy Peripapillary atrophy   C/D Ratio 0.85 0.85   Macula Retinal pigment epithelial mottling, Disciform scar, Hard drusen, Advanced age related macular degeneration, Geographic atrophy Retinal pigment epithelial mottling, Disciform scar, Hard drusen, Advanced age related macular degeneration, Geographic atrophy  Splits the faz., , no hemorrhage, no exudates,, view clear    Vessels Normal Normal   Periphery Normal Normal          IMAGING AND PROCEDURES  Imaging and Procedures for 03/09/20  OCT, Retina - OU - Both Eyes       Right Eye Quality was good. Scan locations included subfoveal. Progression has been stable. Findings include central retinal atrophy, outer retinal atrophy, inner retinal atrophy.   Left Eye Quality was good. Scan locations included subfoveal. Progression has improved. Findings include central retinal atrophy, outer retinal atrophy, subretinal scarring.   Notes OD with medial opacity, yet foveal atrophy accounts for acuity, no signs of paramacular CNVM activity  OS with no active CN VM.  Previously active area temporal to the fovea has no signs of intraretinal or subretinal fluid.  Foveal atrophy accounts for the acuity.  Overall stable at 10 weeks post Avastin                ASSESSMENT/PLAN:  Exudative age-related macular degeneration of left eye with active choroidal neovascularization (HCC) At 10 weeks post injection Avastin to prevent progression of CNVM type activity temporal to the macular region.  At 10 weeks this region is quiescent      ICD-10-CM   1. Exudative age-related macular degeneration  of left eye with active choroidal neovascularization (HCC)  H35.3221 OCT, Retina - OU - Both Eyes  2. Advanced nonexudative age-related macular degeneration of both eyes with subfoveal involvement  H35.3134   3. History of corneal transplant  Z94.7   4. Exudative age-related macular degeneration of right eye with inactive choroidal neovascularization ([place])  H35.3212   5. Primary open angle glaucoma of both eyes, severe stage  H40.1133     1.  No active CN VM OS today, will observe, some 10 weeks post follow-up for paraMacular lesion  2.  Monitor for glaucoma as scheduled  3.  Ophthalmic Meds Ordered this visit:  No orders of the defined types were placed in this encounter.      No follow-ups on file.  There are no Patient Instructions on file for this visit.   Explained the diagnoses, plan, and follow up with the patient and they expressed understanding.  Patient expressed understanding of the importance of proper follow up care.   Clent Demark Johntay Doolen M.D. Diseases & Surgery of the Retina and Vitreous Retina & Diabetic [place] 03/09/20     Abbreviations: M myopia (nearsighted); A astigmatism; H hyperopia (farsighted); P presbyopia; Mrx spectacle prescription;  CTL contact lenses; OD right eye; OS left eye; OU both eyes  XT exotropia; ET esotropia; PEK punctate epithelial keratitis; PEE punctate epithelial erosions; DES dry eye syndrome; MGD meibomian gland dysfunction; ATs artificial tears; PFAT's preservative free artificial tears; [place] nuclear sclerotic cataract; PSC posterior subcapsular cataract;  ERM epi-retinal membrane; PVD posterior vitreous detachment; RD retinal detachment; DM diabetes mellitus; DR diabetic retinopathy; NPDR non-proliferative diabetic retinopathy; PDR proliferative diabetic retinopathy; CSME clinically significant macular edema; DME diabetic macular edema; dbh dot blot hemorrhages; CWS cotton wool spot; POAG primary open angle glaucoma; C/D cup-to-disc ratio;  HVF humphrey visual field; GVF goldmann visual field; OCT optical coherence tomography; IOP intraocular pressure; BRVO Branch retinal vein occlusion; CRVO central retinal vein occlusion; CRAO central retinal artery occlusion; BRAO branch retinal artery occlusion; RT retinal tear; SB scleral buckle; PPV pars plana vitrectomy; VH Vitreous hemorrhage; PRP panretinal laser photocoagulation; IVK intravitreal kenalog; VMT vitreomacular traction; MH Macular hole;  NVD neovascularization of the disc; NVE neovascularization elsewhere; AREDS age related eye disease study; ARMD age related macular degeneration; POAG primary open angle glaucoma; EBMD epithelial/anterior basement membrane dystrophy; ACIOL anterior chamber intraocular lens; IOL intraocular lens; PCIOL posterior chamber intraocular lens; Phaco/IOL phacoemulsification with intraocular lens placement; [place] photorefractive keratectomy; LASIK laser assisted in situ keratomileusis; HTN hypertension; DM diabetes mellitus; COPD chronic obstructive pulmonary disease

## 2020-03-09 NOTE — Assessment & Plan Note (Signed)
At 10 weeks post injection Avastin to prevent progression of CNVM type activity temporal to the macular region.  At 10 weeks this region is quiescent

## 2020-03-09 NOTE — Assessment & Plan Note (Signed)
Visual acuity change OD likely a combination of some medial opacity and advanced glaucoma with loss of fixation in addition to subfoveal fixation loss from atrophic ARMD.

## 2020-03-12 DIAGNOSIS — Q6689 Other  specified congenital deformities of feet: Secondary | ICD-10-CM | POA: Diagnosis not present

## 2020-03-12 DIAGNOSIS — B351 Tinea unguium: Secondary | ICD-10-CM | POA: Diagnosis not present

## 2020-03-13 DIAGNOSIS — H401113 Primary open-angle glaucoma, right eye, severe stage: Secondary | ICD-10-CM | POA: Diagnosis not present

## 2020-03-13 DIAGNOSIS — H31013 Macula scars of posterior pole (postinflammatory) (post-traumatic), bilateral: Secondary | ICD-10-CM | POA: Diagnosis not present

## 2020-03-13 DIAGNOSIS — Z947 Corneal transplant status: Secondary | ICD-10-CM | POA: Diagnosis not present

## 2020-03-13 DIAGNOSIS — H401123 Primary open-angle glaucoma, left eye, severe stage: Secondary | ICD-10-CM | POA: Diagnosis not present

## 2020-04-21 DIAGNOSIS — H31013 Macula scars of posterior pole (postinflammatory) (post-traumatic), bilateral: Secondary | ICD-10-CM | POA: Diagnosis not present

## 2020-04-21 DIAGNOSIS — Z947 Corneal transplant status: Secondary | ICD-10-CM | POA: Diagnosis not present

## 2020-04-21 DIAGNOSIS — H401123 Primary open-angle glaucoma, left eye, severe stage: Secondary | ICD-10-CM | POA: Diagnosis not present

## 2020-04-21 DIAGNOSIS — H401113 Primary open-angle glaucoma, right eye, severe stage: Secondary | ICD-10-CM | POA: Diagnosis not present

## 2020-05-18 DIAGNOSIS — Z23 Encounter for immunization: Secondary | ICD-10-CM | POA: Diagnosis not present

## 2020-06-10 ENCOUNTER — Encounter (INDEPENDENT_AMBULATORY_CARE_PROVIDER_SITE_OTHER): Payer: Medicare Other | Admitting: Ophthalmology

## 2020-06-17 ENCOUNTER — Other Ambulatory Visit: Payer: Self-pay

## 2020-06-17 ENCOUNTER — Encounter (INDEPENDENT_AMBULATORY_CARE_PROVIDER_SITE_OTHER): Payer: Self-pay | Admitting: Ophthalmology

## 2020-06-17 ENCOUNTER — Ambulatory Visit (INDEPENDENT_AMBULATORY_CARE_PROVIDER_SITE_OTHER): Payer: Medicare Other | Admitting: Ophthalmology

## 2020-06-17 DIAGNOSIS — H353221 Exudative age-related macular degeneration, left eye, with active choroidal neovascularization: Secondary | ICD-10-CM | POA: Diagnosis not present

## 2020-06-17 DIAGNOSIS — H353134 Nonexudative age-related macular degeneration, bilateral, advanced atrophic with subfoveal involvement: Secondary | ICD-10-CM

## 2020-06-17 DIAGNOSIS — H401133 Primary open-angle glaucoma, bilateral, severe stage: Secondary | ICD-10-CM

## 2020-06-17 DIAGNOSIS — H353212 Exudative age-related macular degeneration, right eye, with inactive choroidal neovascularization: Secondary | ICD-10-CM

## 2020-06-17 DIAGNOSIS — H353222 Exudative age-related macular degeneration, left eye, with inactive choroidal neovascularization: Secondary | ICD-10-CM | POA: Diagnosis not present

## 2020-06-17 DIAGNOSIS — I25810 Atherosclerosis of coronary artery bypass graft(s) without angina pectoris: Secondary | ICD-10-CM

## 2020-06-17 NOTE — Assessment & Plan Note (Signed)
No signs of reactivation OD

## 2020-06-17 NOTE — Assessment & Plan Note (Signed)
Stable OU, continue as Dr. Edilia Bo

## 2020-06-17 NOTE — Patient Instructions (Signed)
Patient asked to report any new profound worsening of vision in either eye

## 2020-06-17 NOTE — Progress Notes (Signed)
06/17/2020     CHIEF COMPLAINT Patient presents for Retina Follow Up   HISTORY OF PRESENT ILLNESS: Jason [name] is a 86 y.o. male who presents to the clinic today for:   HPI    Retina Follow Up    Patient presents with  Wet AMD.  In both eyes.  Severity is moderate.  Duration of 3 months.  Since onset it is stable.  I, the attending physician,  performed the HPI with the patient and updated documentation appropriately.          Comments    3 Month Wet AMD f\u OU. OCT  Pt states vision is about the same. Using gtts as directed.       Last edited by Tilda Franco on 06/17/2020  9:14 AM. (History)      Referring physician: [name], MD [place],  [place] [address]  HISTORICAL INFORMATION:   Selected notes from the MEDICAL RECORD NUMBER    Lab Results  Component Value Date   HGBA1C 5.5 10/17/2016     CURRENT MEDICATIONS: Current Outpatient Medications (Ophthalmic Drugs)  Medication Sig  . brimonidine (ALPHAGAN) 0.2 % ophthalmic solution Place 1 drop into the left eye 3 (three) times daily.   . dorzolamide-timolol (COSOPT) 22.3-6.8 MG/ML ophthalmic solution Place 1 drop into both eyes 2 (two) times daily.  [name] latanoprost (XALATAN) 0.005 % ophthalmic solution Place 1 drop into both eyes daily.  . prednisoLONE acetate (PRED FORTE) 1 % ophthalmic suspension Place 1 drop into the right eye every other day.    No current facility-administered medications for this visit. (Ophthalmic Drugs)   Current Outpatient Medications (Other)  Medication Sig  . acetaminophen (TYLENOL) 325 MG tablet Take 650 mg by mouth every 6 (six) hours as needed.  [name] amitriptyline (ELAVIL) 25 MG tablet Take 25 mg by mouth daily.  [name] apixaban (ELIQUIS) 5 MG TABS tablet Take 5 mg by mouth 2 (two) times daily.  . Calcium Carbonate-Vitamin D (CALCIUM-VITAMIN D) 500-200 MG-UNIT per tablet Take 1 tablet by mouth daily.  . Cholecalciferol 1000 UNITS capsule Take 1,000 Units by  mouth daily.   . famotidine (PEPCID) 20 MG tablet Take 20 mg by mouth 2 (two) times daily.  . fluticasone (FLONASE) 50 MCG/ACT nasal spray Place 1 spray into both nostrils daily as needed for allergies or rhinitis.   [name] gabapentin (NEURONTIN) 300 MG capsule Take 300 mg by mouth 3 (three) times daily.  . Glucosamine-Chondroitin-MSM TABS Take by mouth daily.  . metoprolol tartrate (LOPRESSOR) 25 MG tablet Take 1 tablet (25 mg total) by mouth 2 (two) times daily.  . Multiple Vitamin (MULTIVITAMIN WITH MINERALS) TABS tablet Take 1 tablet by mouth daily.  [name] omega-3 acid ethyl esters (LOVAZA) 1 G capsule Take 1 g by mouth daily.   [name] perphenazine (TRILAFON) 4 MG tablet Take 4 mg by mouth daily.  . psyllium (METAMUCIL) 58.6 % powder Take 1 packet by mouth daily.  . rosuvastatin (CRESTOR) 5 MG tablet Take 5 mg by mouth every other day.   . vitamin B-12 (CYANOCOBALAMIN) 1000 MCG tablet Take 1,000 mcg by mouth daily.   No current facility-administered medications for this visit. (Other)      REVIEW OF SYSTEMS:    ALLERGIES No Known Allergies  PAST MEDICAL HISTORY Past Medical History:  Diagnosis Date  . Abnormal PSA   . Adenomatous polyp of colon 2010 and before 2006  . Anemia   . Aortic stenosis   .  Aortic valve prosthesis present   . Atrial fibrillation ([place])   . CAD (coronary artery disease)   . DDD (degenerative disc disease), lumbar   . Depression   . Diverticulosis   . Dyslipidemia   . GERD (gastroesophageal reflux disease)   . Glaucoma   . History of blood transfusion 09/29/2016  . Hx of CABG   . Hypogonadism, male   . Macular degeneration   . OSA (obstructive sleep apnea)   . Osteoarthritis   . Osteopenia   . Prostate CA [facility]) prostate 2007   colon dx 2018, watching psa levels, no tx yet for prostate   Past Surgical History:  Procedure Laterality Date  . AORTIC VALVE REPLACEMENT  October 2011   Magna Ease pericardial tissue valve #15mm  . [place]   lower   . CATARACT EXTRACTION Bilateral   . CORNEAL TRANSPLANT Right   . CORONARY ARTERY BYPASS GRAFT  October 2011   LIMA to LAD  . ESOPHAGOGASTRODUODENOSCOPY N/A 09/16/2014   Procedure: ESOPHAGOGASTRODUODENOSCOPY (EGD);  Surgeon: [name], MD;  Location: [facility] ENDOSCOPY;  Service: Endoscopy;  Laterality: N/A;  . TONSILLECTOMY  76 yrs ago    FAMILY HISTORY Family History  Problem Relation Age of Onset  . Heart attack Father   . Colon cancer Neg Hx   . Esophageal cancer Neg Hx   . Stomach cancer Neg Hx   . Rectal cancer Neg Hx     SOCIAL HISTORY Social History   Tobacco Use  . Smoking status: Former Smoker    Packs/day: 1.00    Years: 40.00    Pack years: 40.00    Types: Cigarettes    Quit date: 11/16/1990    Years since quitting: 29.6  . Smokeless tobacco: Never Used  Vaping Use  . Vaping Use: Never used  Substance Use Topics  . Alcohol use: Yes    Alcohol/week: 2.0 standard drinks    Types: 2 Glasses of wine per week    Comment: wine few x per week  . Drug use: No         OPHTHALMIC EXAM:  Base Eye Exam    Visual Acuity (Snellen - Linear)      Right Left   Dist cc CF @ 3' CF @ 3'   Dist ph cc NI NI       Tonometry (Tonopen, 9:18 AM)      Right Left   Pressure 10 13       Pupils      Pupils Dark Light Shape React APD   Right PERRL 4 4 Round Minimal None   Left PERRL 4 4 Round Minimal None       Visual Fields (Counting fingers)      Left Right   Restrictions Partial outer superior temporal deficiency Partial outer superior temporal, inferior temporal, superior nasal, inferior nasal deficiencies       Neuro/Psych    Oriented x3: Yes   Mood/Affect: Normal       Dilation    Both eyes: 1.0% Mydriacyl, 2.5% Phenylephrine @ 9:18 AM        Slit Lamp and Fundus Exam    External Exam      Right Left   External Normal Normal       Slit Lamp Exam      Right Left   Lids/Lashes Normal Normal   Conjunctiva/Sclera White and quiet White and quiet    Cornea Clear Clear   Anterior Chamber Deep  and quiet Deep and quiet   Iris Round and reactive Round and reactive   Lens Posterior chamber intraocular lens Posterior chamber intraocular lens   Anterior Vitreous Normal Normal       Fundus Exam      Right Left   Posterior Vitreous Posterior vitreous detachment Normal   Disc hazy view thru ant. segment, Thin rim, Peripapillary atrophy Peripapillary atrophy   C/D Ratio 0.85 0.85   Macula Retinal pigment epithelial mottling, Disciform scar, Hard drusen, Advanced age related macular degeneration, Geographic atrophy into the foveal, macular region Retinal pigment epithelial mottling, Disciform scar, Hard drusen, Advanced age related macular degeneration, Geographic atrophy  Splits the faz., , no hemorrhage, no exudates,, view clear    Vessels Normal Normal   Periphery Normal Normal          IMAGING AND PROCEDURES  Imaging and Procedures for 06/17/20  OCT, Retina - OU - Both Eyes       Right Eye Quality was good. Scan locations included subfoveal. Central Foveal Thickness: 148. Progression has been stable. Findings include central retinal atrophy, outer retinal atrophy, inner retinal atrophy, abnormal foveal contour, disciform scar, no IRF, no SRF.   Left Eye Quality was good. Scan locations included subfoveal. Central Foveal Thickness: 186. Progression has improved. Findings include central retinal atrophy, outer retinal atrophy, subretinal scarring, abnormal foveal contour, disciform scar, no IRF, no SRF.   Notes OD with medial opacity, yet foveal atrophy accounts for acuity, no signs of paramacular CNVM activity  OS with no active CN VM.  Previously active area temporal to the fovea has no signs of intraretinal or subretinal fluid.  Foveal atrophy accounts for the acuity.  Overall stable at 5 months  post Avastin                ASSESSMENT/PLAN:  Advanced nonexudative age-related macular degeneration of both eyes with  subfoveal involvement Stable at present OU  Exudative age-related macular degeneration of right eye with inactive choroidal neovascularization (HCC) No signs of reactivation OD  Primary open angle glaucoma of both eyes, severe stage Stable OU, continue as Dr. Edilia Bo      ICD-10-CM   1. Exudative age-related macular degeneration of left eye with inactive choroidal neovascularization (HCC)  H35.3222 OCT, Retina - OU - Both Eyes  2. Exudative age-related macular degeneration of left eye with active choroidal neovascularization ([place])  H35.3221   3. Exudative age-related macular degeneration of right eye with inactive choroidal neovascularization (HCC)  H35.3212 OCT, Retina - OU - Both Eyes  4. Advanced nonexudative age-related macular degeneration of both eyes with subfoveal involvement  H35.3134   5. Primary open angle glaucoma of both eyes, severe stage  H40.1133     1.  We will continue to observe retinal condition, no active CNVM today  2.  Follow-up Dr. Dionne Ano for glaucoma as scheduled  3.  Patient reports late afternoon cloudiness of vision, most likely corneal related  Ophthalmic Meds Ordered this visit:  No orders of the defined types were placed in this encounter.      Return in about 6 months (around 12/16/2020) for COLOR FP, OCT, DILATE OU.  Patient Instructions  Patient asked to report any new profound worsening of vision in either eye    Explained the diagnoses, plan, and follow up with the patient and they expressed understanding.  Patient expressed understanding of the importance of proper follow up care.   Clent Demark Chevon Fomby M.D. Diseases & Surgery of the Retina  and Vitreous Retina & Diabetic [place] 06/17/20     Abbreviations: M myopia (nearsighted); A astigmatism; H hyperopia (farsighted); P presbyopia; Mrx spectacle prescription;  CTL contact lenses; OD right eye; OS left eye; OU both eyes  XT exotropia; ET esotropia; PEK punctate epithelial keratitis;  PEE punctate epithelial erosions; DES dry eye syndrome; MGD meibomian gland dysfunction; ATs artificial tears; PFAT's preservative free artificial tears; [place] nuclear sclerotic cataract; PSC posterior subcapsular cataract; ERM epi-retinal membrane; PVD posterior vitreous detachment; RD retinal detachment; DM diabetes mellitus; DR diabetic retinopathy; NPDR non-proliferative diabetic retinopathy; PDR proliferative diabetic retinopathy; CSME clinically significant macular edema; DME diabetic macular edema; dbh dot blot hemorrhages; CWS cotton wool spot; POAG primary open angle glaucoma; C/D cup-to-disc ratio; HVF humphrey visual field; GVF goldmann visual field; OCT optical coherence tomography; IOP intraocular pressure; BRVO Branch retinal vein occlusion; CRVO central retinal vein occlusion; CRAO central retinal artery occlusion; BRAO branch retinal artery occlusion; RT retinal tear; SB scleral buckle; PPV pars plana vitrectomy; VH Vitreous hemorrhage; PRP panretinal laser photocoagulation; IVK intravitreal kenalog; VMT vitreomacular traction; MH Macular hole;  NVD neovascularization of the disc; NVE neovascularization elsewhere; AREDS age related eye disease study; ARMD age related macular degeneration; POAG primary open angle glaucoma; EBMD epithelial/anterior basement membrane dystrophy; ACIOL anterior chamber intraocular lens; IOL intraocular lens; PCIOL posterior chamber intraocular lens; Phaco/IOL phacoemulsification with intraocular lens placement; [place] photorefractive keratectomy; LASIK laser assisted in situ keratomileusis; HTN hypertension; DM diabetes mellitus; COPD chronic obstructive pulmonary disease

## 2020-06-17 NOTE — Assessment & Plan Note (Signed)
Stable at present OU

## 2020-07-06 DIAGNOSIS — Z23 Encounter for immunization: Secondary | ICD-10-CM | POA: Diagnosis not present

## 2020-07-13 ENCOUNTER — Inpatient Hospital Stay: Payer: Medicare Other

## 2020-07-13 ENCOUNTER — Telehealth: Payer: Self-pay | Admitting: Oncology

## 2020-07-13 ENCOUNTER — Other Ambulatory Visit: Payer: Self-pay

## 2020-07-13 ENCOUNTER — Inpatient Hospital Stay: Payer: Medicare Other | Attending: Oncology | Admitting: Oncology

## 2020-07-13 VITALS — BP 104/71 | HR 81 | Temp 97.0°F | Resp 16 | Ht 71.0 in | Wt 173.9 lb

## 2020-07-13 DIAGNOSIS — Z951 Presence of aortocoronary bypass graft: Secondary | ICD-10-CM | POA: Insufficient documentation

## 2020-07-13 DIAGNOSIS — Z8546 Personal history of malignant neoplasm of prostate: Secondary | ICD-10-CM | POA: Insufficient documentation

## 2020-07-13 DIAGNOSIS — I2510 Atherosclerotic heart disease of native coronary artery without angina pectoris: Secondary | ICD-10-CM | POA: Diagnosis not present

## 2020-07-13 DIAGNOSIS — C180 Malignant neoplasm of cecum: Secondary | ICD-10-CM | POA: Insufficient documentation

## 2020-07-13 DIAGNOSIS — Z7901 Long term (current) use of anticoagulants: Secondary | ICD-10-CM | POA: Diagnosis not present

## 2020-07-13 DIAGNOSIS — Z9049 Acquired absence of other specified parts of digestive tract: Secondary | ICD-10-CM | POA: Insufficient documentation

## 2020-07-13 DIAGNOSIS — K1230 Oral mucositis (ulcerative), unspecified: Secondary | ICD-10-CM | POA: Diagnosis not present

## 2020-07-13 DIAGNOSIS — I25810 Atherosclerosis of coronary artery bypass graft(s) without angina pectoris: Secondary | ICD-10-CM | POA: Diagnosis not present

## 2020-07-13 DIAGNOSIS — H409 Unspecified glaucoma: Secondary | ICD-10-CM | POA: Diagnosis not present

## 2020-07-13 DIAGNOSIS — Z79899 Other long term (current) drug therapy: Secondary | ICD-10-CM | POA: Insufficient documentation

## 2020-07-13 LAB — CEA (IN HOUSE-CHCC): CEA (CHCC-In House): 5.25 ng/mL — ABNORMAL HIGH (ref 0.00–5.00)

## 2020-07-13 NOTE — Telephone Encounter (Signed)
Scheduled appointment per 11/16 los. Spoke to patient who is aware of appointment date and time. Gave patient calendar print out.

## 2020-07-13 NOTE — Progress Notes (Signed)
  [place] OFFICE PROGRESS NOTE   Diagnosis: Colon cancer  INTERVAL HISTORY:   Mr. Ende returns as scheduled.  He feels well.  Good appetite.  No difficulty with bowel function.  No new complaint.  Objective:  Vital signs in last 24 hours:  Blood pressure 104/71, pulse 81, temperature (!) 97 F (36.1 C), temperature source Tympanic, resp. rate 16, height $RemoveBe'5\' 11"'WVKiMjcTv$  (1.803 m), weight 173 lb 14.4 oz (78.9 kg), SpO2 96 %.    Lymphatics: No cervical, supraclavicular, axillary, or inguinal nodes Resp: Lungs clear bilaterally Cardio: Irregular GI: No mass, nontender, no hepatosplenomegaly Vascular: No leg edema  Lab Results:  Lab Results  Component Value Date   WBC 5.0 05/09/2017   HGB 13.7 05/09/2017   HCT 40.7 05/09/2017   MCV 109.4 (H) 05/09/2017   PLT 143 05/09/2017   NEUTROABS 2.8 05/09/2017    CMP  Lab Results  Component Value Date   NA 139 04/11/2017   K 3.8 04/11/2017   CL 104 11/01/2016   CO2 27 04/11/2017   GLUCOSE 97 04/11/2017   BUN 13.7 04/11/2017   CREATININE 1.0 04/11/2017   CALCIUM 9.4 04/11/2017   PROT 7.5 04/11/2017   ALBUMIN 3.9 04/11/2017   AST 22 04/11/2017   ALT 19 04/11/2017   ALKPHOS 54 04/11/2017   BILITOT 0.91 04/11/2017   GFRNONAA >60 11/01/2016   GFRAA >60 11/01/2016    Lab Results  Component Value Date   CEA1 5.25 (H) 07/13/2020     Medications: I have reviewed the patient's current medications.   Assessment/Plan: 1. Colon cancer, cecum, stage IIIB (T4a,N1b), status post a right colectomy 10/24/2016  3/23 lymph nodes positive for metastatic adenocarcinoma  MSI-stable, no loss of mismatch repair protein expression  Cycle 1 adjuvant Xeloda 11/20/2016  Cycle 2 adjuvant Xeloda 12/12/2016 (dose reduced due to mucositis following cycle 1)  Cycle 3 adjuvant Xeloda 01/02/2017  Cycle 4 adjuvant Xeloda 01/23/2017  Cycle 5 adjuvant Xeloda 02/13/2017  Cycle 6 adjuvant Xeloda 03/06/2017  Cycle 7 adjuvant  Xeloda 03/27/2017  Cycle 8 adjuvant Xeloda 04/17/2017  Colonoscopy on 03/11/2018-3 mm polyp in the descending colon (tubular adenoma, no high-grade dysplasia)  CT abdomen/pelvis 10/17/2019-no evidence of metastatic disease  2. History of iron deficiency anemia secondary to #1, status post IV iron therapy and multiple red cell transfusions; hemoglobin improved 11/16/2016;hemoglobin normal range 01/17/2017  3. Prostate cancer followed by urology with observation  Bone scan 04/24/2018- no change from comparison exam 10/19/2017.  No evidence of skeletal metastasis.  Bone scan 10/08/2019-questionable new focus of uptake and a posterior left rib, otherwise stable  4. History of coronary artery disease, status post coronary artery bypass surgery 2011  5. Aortic valve replacement 2011  6. Atrial fibrillation-previously maintained on Coumadin;switched to Eliquisdue to known interaction between Coumadin and Xeloda  7. Glaucoma  8. Mucositis secondary to Xeloda following cycle 1; Xeloda dose reduced beginning with cycle 2.     Disposition: Mr. Berkheimer remains in clinical remission from colon cancer.  The CEA remains mildly elevated, but has not changed significantly over the past few years. Mr. Haubner will continue follow-up at the Cancer center.  He will return for an office visit and CEA in 8 months.  Betsy Coder, MD  07/13/2020  12:12 PM

## 2020-07-14 ENCOUNTER — Telehealth: Payer: Self-pay

## 2020-07-14 DIAGNOSIS — Z947 Corneal transplant status: Secondary | ICD-10-CM | POA: Diagnosis not present

## 2020-07-14 DIAGNOSIS — H401123 Primary open-angle glaucoma, left eye, severe stage: Secondary | ICD-10-CM | POA: Diagnosis not present

## 2020-07-14 DIAGNOSIS — H31013 Macula scars of posterior pole (postinflammatory) (post-traumatic), bilateral: Secondary | ICD-10-CM | POA: Diagnosis not present

## 2020-07-14 DIAGNOSIS — H16401 Unspecified corneal neovascularization, right eye: Secondary | ICD-10-CM | POA: Diagnosis not present

## 2020-07-14 DIAGNOSIS — H401113 Primary open-angle glaucoma, right eye, severe stage: Secondary | ICD-10-CM | POA: Diagnosis not present

## 2020-07-14 DIAGNOSIS — Z961 Presence of intraocular lens: Secondary | ICD-10-CM | POA: Diagnosis not present

## 2020-07-14 NOTE — Telephone Encounter (Signed)
Called spoke with wife concerning CEA results and f/u wife appreciative of call

## 2020-07-20 DIAGNOSIS — M5136 Other intervertebral disc degeneration, lumbar region: Secondary | ICD-10-CM | POA: Diagnosis not present

## 2020-07-20 DIAGNOSIS — I350 Nonrheumatic aortic (valve) stenosis: Secondary | ICD-10-CM | POA: Diagnosis not present

## 2020-07-20 DIAGNOSIS — Z952 Presence of prosthetic heart valve: Secondary | ICD-10-CM | POA: Diagnosis not present

## 2020-07-20 DIAGNOSIS — M8580 Other specified disorders of bone density and structure, unspecified site: Secondary | ICD-10-CM | POA: Diagnosis not present

## 2020-07-20 DIAGNOSIS — D6869 Other thrombophilia: Secondary | ICD-10-CM | POA: Diagnosis not present

## 2020-07-20 DIAGNOSIS — I480 Paroxysmal atrial fibrillation: Secondary | ICD-10-CM | POA: Diagnosis not present

## 2020-07-20 DIAGNOSIS — C61 Malignant neoplasm of prostate: Secondary | ICD-10-CM | POA: Diagnosis not present

## 2020-07-20 DIAGNOSIS — R7301 Impaired fasting glucose: Secondary | ICD-10-CM | POA: Diagnosis not present

## 2020-07-20 DIAGNOSIS — C180 Malignant neoplasm of cecum: Secondary | ICD-10-CM | POA: Diagnosis not present

## 2020-07-21 ENCOUNTER — Other Ambulatory Visit: Payer: Self-pay | Admitting: Urology

## 2020-07-21 ENCOUNTER — Other Ambulatory Visit (HOSPITAL_COMMUNITY): Payer: Self-pay | Admitting: Urology

## 2020-07-21 DIAGNOSIS — C61 Malignant neoplasm of prostate: Secondary | ICD-10-CM

## 2020-08-02 DIAGNOSIS — H31013 Macula scars of posterior pole (postinflammatory) (post-traumatic), bilateral: Secondary | ICD-10-CM | POA: Diagnosis not present

## 2020-08-02 DIAGNOSIS — H401113 Primary open-angle glaucoma, right eye, severe stage: Secondary | ICD-10-CM | POA: Diagnosis not present

## 2020-08-02 DIAGNOSIS — Z947 Corneal transplant status: Secondary | ICD-10-CM | POA: Diagnosis not present

## 2020-08-02 DIAGNOSIS — H401123 Primary open-angle glaucoma, left eye, severe stage: Secondary | ICD-10-CM | POA: Diagnosis not present

## 2020-08-10 DIAGNOSIS — M7918 Myalgia, other site: Secondary | ICD-10-CM | POA: Diagnosis not present

## 2020-08-10 DIAGNOSIS — C61 Malignant neoplasm of prostate: Secondary | ICD-10-CM | POA: Diagnosis not present

## 2020-08-10 DIAGNOSIS — M545 Low back pain, unspecified: Secondary | ICD-10-CM | POA: Diagnosis not present

## 2020-08-11 ENCOUNTER — Encounter (HOSPITAL_COMMUNITY)
Admission: RE | Admit: 2020-08-11 | Discharge: 2020-08-11 | Disposition: A | Discharge status: Home / Self Care | Payer: Medicare Other | Source: Ambulatory Visit | Attending: Urology | Admitting: Urology

## 2020-08-11 ENCOUNTER — Ambulatory Visit (HOSPITAL_COMMUNITY)
Admission: RE | Admit: 2020-08-11 | Discharge: 2020-08-11 | Disposition: A | Discharge status: Home / Self Care | Payer: Medicare Other | Source: Ambulatory Visit | Attending: Urology | Admitting: Urology

## 2020-08-11 ENCOUNTER — Other Ambulatory Visit: Payer: Self-pay

## 2020-08-11 DIAGNOSIS — M5136 Other intervertebral disc degeneration, lumbar region: Secondary | ICD-10-CM | POA: Diagnosis not present

## 2020-08-11 DIAGNOSIS — Z8546 Personal history of malignant neoplasm of prostate: Secondary | ICD-10-CM | POA: Diagnosis not present

## 2020-08-11 DIAGNOSIS — N200 Calculus of kidney: Secondary | ICD-10-CM | POA: Diagnosis not present

## 2020-08-11 DIAGNOSIS — C61 Malignant neoplasm of prostate: Secondary | ICD-10-CM

## 2020-08-11 DIAGNOSIS — N281 Cyst of kidney, acquired: Secondary | ICD-10-CM | POA: Diagnosis not present

## 2020-08-11 DIAGNOSIS — M47816 Spondylosis without myelopathy or radiculopathy, lumbar region: Secondary | ICD-10-CM | POA: Diagnosis not present

## 2020-08-11 DIAGNOSIS — R948 Abnormal results of function studies of other organs and systems: Secondary | ICD-10-CM | POA: Diagnosis not present

## 2020-08-11 MED ORDER — TECHNETIUM TC 99M MEDRONATE IV KIT: 20.0000 | PACK | Freq: Once | INTRAVENOUS | Status: DC | PRN

## 2020-08-17 DIAGNOSIS — C61 Malignant neoplasm of prostate: Secondary | ICD-10-CM | POA: Diagnosis not present

## 2020-10-14 DIAGNOSIS — M8589 Other specified disorders of bone density and structure, multiple sites: Secondary | ICD-10-CM | POA: Diagnosis not present

## 2020-12-16 ENCOUNTER — Encounter (INDEPENDENT_AMBULATORY_CARE_PROVIDER_SITE_OTHER): Payer: Medicare Other | Admitting: Ophthalmology

## 2020-12-25 DIAGNOSIS — M5136 Other intervertebral disc degeneration, lumbar region: Secondary | ICD-10-CM | POA: Diagnosis not present

## 2020-12-25 DIAGNOSIS — E785 Hyperlipidemia, unspecified: Secondary | ICD-10-CM | POA: Diagnosis not present

## 2020-12-25 DIAGNOSIS — G4733 Obstructive sleep apnea (adult) (pediatric): Secondary | ICD-10-CM | POA: Diagnosis not present

## 2020-12-28 ENCOUNTER — Encounter (INDEPENDENT_AMBULATORY_CARE_PROVIDER_SITE_OTHER): Payer: Medicare Other | Admitting: Ophthalmology

## 2021-01-03 ENCOUNTER — Encounter (INDEPENDENT_AMBULATORY_CARE_PROVIDER_SITE_OTHER): Payer: Self-pay | Admitting: Ophthalmology

## 2021-01-03 ENCOUNTER — Other Ambulatory Visit: Payer: Self-pay

## 2021-01-03 ENCOUNTER — Ambulatory Visit (INDEPENDENT_AMBULATORY_CARE_PROVIDER_SITE_OTHER): Payer: Medicare Other | Admitting: Ophthalmology

## 2021-01-03 DIAGNOSIS — H353221 Exudative age-related macular degeneration, left eye, with active choroidal neovascularization: Secondary | ICD-10-CM

## 2021-01-03 DIAGNOSIS — H401133 Primary open-angle glaucoma, bilateral, severe stage: Secondary | ICD-10-CM | POA: Diagnosis not present

## 2021-01-03 DIAGNOSIS — H353134 Nonexudative age-related macular degeneration, bilateral, advanced atrophic with subfoveal involvement: Secondary | ICD-10-CM | POA: Diagnosis not present

## 2021-01-03 DIAGNOSIS — H353222 Exudative age-related macular degeneration, left eye, with inactive choroidal neovascularization: Secondary | ICD-10-CM | POA: Diagnosis not present

## 2021-01-03 NOTE — Assessment & Plan Note (Signed)
Stay on topical medications as per Dr. Dionne Ano, [facility] service [facility]

## 2021-01-03 NOTE — Assessment & Plan Note (Signed)
No recurrence of CNVM 

## 2021-01-03 NOTE — Progress Notes (Signed)
01/03/2021     CHIEF COMPLAINT Patient presents for Retina Follow Up (73month fu OU. /Pt states, " My vision is getting dimmer and  I cannot see as well."/Pt reports using Prednisolone gtts daily OD/Brimonidine BID OU, Cosopt BID OU and Latanoprost QHS OU)   HISTORY OF PRESENT ILLNESS: Jason [name] is a 87 y.o. male who presents to the clinic today for:   HPI    Retina Follow Up    Diagnosis: Wet AMD   Laterality: left eye   Onset: 6 months ago   Duration: 6 months   Course: gradually worsening   Comments: 29month fu OU.  Pt states, " My vision is getting dimmer and  I cannot see as well." Pt reports using Prednisolone gtts daily OD Brimonidine BID OU, Cosopt BID OU and Latanoprost QHS OU       Last edited by Kendra Opitz, COA on 01/03/2021 10:02 AM. (History)      Referring physician: [name], MD [place],  [place] [address]  HISTORICAL INFORMATION:   Selected notes from the MEDICAL RECORD NUMBER    Lab Results  Component Value Date   HGBA1C 5.5 10/17/2016     CURRENT MEDICATIONS: Current Outpatient Medications (Ophthalmic Drugs)  Medication Sig  . brimonidine (ALPHAGAN) 0.2 % ophthalmic solution Place 1 drop into the left eye 3 (three) times daily.   . dorzolamide-timolol (COSOPT) 22.3-6.8 MG/ML ophthalmic solution Place 1 drop into both eyes 2 (two) times daily.  [name] latanoprost (XALATAN) 0.005 % ophthalmic solution Place 1 drop into both eyes daily.  . prednisoLONE acetate (PRED FORTE) 1 % ophthalmic suspension Place 1 drop into the right eye every other day.    No current facility-administered medications for this visit. (Ophthalmic Drugs)   Current Outpatient Medications (Other)  Medication Sig  . acetaminophen (TYLENOL) 325 MG tablet Take 650 mg by mouth every 6 (six) hours as needed.  [name] amitriptyline (ELAVIL) 25 MG tablet Take 25 mg by mouth daily.  [name] apixaban (ELIQUIS) 5 MG TABS tablet Take 5 mg by mouth 2 (two) times daily.  .  Calcium Carbonate-Vitamin D (CALCIUM-VITAMIN D) 500-200 MG-UNIT per tablet Take 1 tablet by mouth daily.  . Cholecalciferol 1000 UNITS capsule Take 1,000 Units by mouth daily.   . diclofenac Sodium (VOLTAREN) 1 % GEL Apply topically in the morning and at bedtime.  . famotidine (PEPCID) 20 MG tablet Take 20 mg by mouth 2 (two) times daily.  . fluticasone (FLONASE) 50 MCG/ACT nasal spray Place 1 spray into both nostrils daily as needed for allergies or rhinitis.   [name] gabapentin (NEURONTIN) 300 MG capsule Take 300 mg by mouth 3 (three) times daily.  . metoprolol tartrate (LOPRESSOR) 25 MG tablet Take 1 tablet (25 mg total) by mouth 2 (two) times daily.  . Multiple Vitamin (MULTIVITAMIN WITH MINERALS) TABS tablet Take 1 tablet by mouth daily.  [name] perphenazine (TRILAFON) 4 MG tablet Take 4 mg by mouth daily.  . psyllium (METAMUCIL) 58.6 % powder Take 1 packet by mouth daily.  . rosuvastatin (CRESTOR) 5 MG tablet Take 5 mg by mouth every other day.   . vitamin B-12 (CYANOCOBALAMIN) 1000 MCG tablet Take 1,000 mcg by mouth daily.   No current facility-administered medications for this visit. (Other)      REVIEW OF SYSTEMS:    ALLERGIES No Known Allergies  PAST MEDICAL HISTORY Past Medical History:  Diagnosis Date  . Abnormal PSA   . Adenomatous polyp of colon 2010  and before 2006  . Anemia   . Aortic stenosis   . Aortic valve prosthesis present   . Atrial fibrillation ([place])   . CAD (coronary artery disease)   . DDD (degenerative disc disease), lumbar   . Depression   . Diverticulosis   . Dyslipidemia   . GERD (gastroesophageal reflux disease)   . Glaucoma   . History of blood transfusion 09/29/2016  . Hx of CABG   . Hypogonadism, male   . Macular degeneration   . OSA (obstructive sleep apnea)   . Osteoarthritis   . Osteopenia   . Prostate CA [facility]) prostate 2007   colon dx 2018, watching psa levels, no tx yet for prostate   Past Surgical History:  Procedure Laterality Date   . AORTIC VALVE REPLACEMENT  October 2011   Magna Ease pericardial tissue valve #24mm  . Advance   lower  . CATARACT EXTRACTION Bilateral   . CORNEAL TRANSPLANT Right   . CORONARY ARTERY BYPASS GRAFT  October 2011   LIMA to LAD  . ESOPHAGOGASTRODUODENOSCOPY N/A 09/16/2014   Procedure: ESOPHAGOGASTRODUODENOSCOPY (EGD);  Surgeon: [name], MD;  Location: [facility] ENDOSCOPY;  Service: Endoscopy;  Laterality: N/A;  . TONSILLECTOMY  76 yrs ago    FAMILY HISTORY Family History  Problem Relation Age of Onset  . Heart attack Father   . Colon cancer Neg Hx   . Esophageal cancer Neg Hx   . Stomach cancer Neg Hx   . Rectal cancer Neg Hx     SOCIAL HISTORY Social History   Tobacco Use  . Smoking status: Former Smoker    Packs/day: 1.00    Years: 40.00    Pack years: 40.00    Types: Cigarettes    Quit date: 11/16/1990    Years since quitting: 30.1  . Smokeless tobacco: Never Used  Vaping Use  . Vaping Use: Never used  Substance Use Topics  . Alcohol use: Yes    Alcohol/week: 2.0 standard drinks    Types: 2 Glasses of wine per week    Comment: wine few x per week  . Drug use: No         OPHTHALMIC EXAM: Base Eye Exam    Visual Acuity (ETDRS)      Right Left   Dist  CF at 3' CF at 3'   Correction: Glasses       Pachymetry (01/03/2021)      Right Left   Thickness 11 12       Pupils      Pupils Dark Light Shape React APD   Right PERRL 4 4 Round Minimal None   Left PERRL 4 4 Round Minimal None       Visual Fields      Left Right   Restrictions Partial outer superior temporal deficiency Partial outer superior temporal, inferior temporal, superior nasal, inferior nasal deficiencies       Neuro/Psych    Oriented x3: Yes   Mood/Affect: Normal       Dilation    Both eyes: 1.0% Mydriacyl, 2.5% Phenylephrine @ 10:04 AM        Slit Lamp and Fundus Exam    External Exam      Right Left   External Normal Normal       Slit Lamp Exam      Right Left    Lids/Lashes Normal Normal   Conjunctiva/Sclera White and quiet White and quiet   Cornea Clear Clear  Anterior Chamber Deep and quiet Deep and quiet   Iris Round and reactive Round and reactive   Lens Posterior chamber intraocular lens Posterior chamber intraocular lens   Anterior Vitreous Normal Normal       Fundus Exam      Right Left   Posterior Vitreous Posterior vitreous detachment Normal   Disc hazy view thru ant. segment, Thin rim, Peripapillary atrophy Peripapillary atrophy   C/D Ratio 0.85 0.85   Macula Retinal pigment epithelial mottling, Disciform scar, Hard drusen, Advanced age related macular degeneration, Geographic atrophy into the foveal 1.5 DA size, macular region Retinal pigment epithelial mottling, Disciform scar, Hard drusen, Advanced age related macular degeneration, Geographic atrophy encompasses the FAZ, overall 7-8 disc area size of atrophy., , no hemorrhage, no exudates,, view clear    Vessels Normal Normal   Periphery Normal Normal          IMAGING AND PROCEDURES  Imaging and Procedures for 01/03/21  OCT, Retina - OU - Both Eyes       Right Eye Quality was good. Scan locations included subfoveal. Central Foveal Thickness: 159. Progression has been stable. Findings include central retinal atrophy, outer retinal atrophy, inner retinal atrophy, abnormal foveal contour, disciform scar, no IRF, no SRF.   Left Eye Quality was good. Scan locations included subfoveal. Central Foveal Thickness: 249. Progression has improved. Findings include central retinal atrophy, outer retinal atrophy, subretinal scarring, abnormal foveal contour, disciform scar, no IRF, no SRF.   Notes OD with media opacity, yet foveal atrophy accounts for acuity, no signs of paramacular CNVM activity  OS with no active CN VM.  Previously active area temporal to the fovea has no signs of intraretinal or subretinal fluid.  Foveal atrophy accounts for the acuity.  Overall stable at 12  months  post Avastin       Color Fundus Photography Optos - OU - Both Eyes       Right Eye Progression has been stable. Disc findings include increased cup to disc ratio. Macula : geographic atrophy. Vessels : normal observations. Periphery : normal observations.   Left Eye Progression has worsened. Disc findings include increased cup to disc ratio. Macula : geographic atrophy. Vessels : normal observations. Periphery : normal observations.   Notes No signs of active CNVM OU.                ASSESSMENT/PLAN:  Advanced nonexudative age-related macular degeneration of both eyes with subfoveal involvement Atrophy accounts for acuity, no signs on clinical exam nor on OCT of active para macular CNVM OU Will observe  Exudative age-related macular degeneration of left eye with inactive choroidal neovascularization (HCC) No recurrence of CNVM  Primary open angle glaucoma of both eyes, severe stage Stay on topical medications as per Dr. Dionne Ano, glaucoma service [place]   1. Exudative age-related macular degeneration of left eye with active choroidal neovascularization (HCC)  H35.3221 OCT, Retina - OU - Both Eyes    Color Fundus Photography Optos - OU - Both Eyes  2. Advanced nonexudative age-related macular degeneration of both eyes with subfoveal involvement  H35.3134   3. Exudative age-related macular degeneration of left eye with inactive choroidal neovascularization ([place])  H35.3222   4. Primary open angle glaucoma of both eyes, severe stage  H40.1133     1.  2.  3.  Ophthalmic Meds Ordered this visit:  No orders of the defined types were placed in this encounter.  Return in about 9 months (around 10/06/2021) for DILATE OU, COLOR FP, OCT.  There are no Patient Instructions on file for this visit.   Explained the diagnoses, plan, and follow up with the patient and they expressed understanding.  Patient expressed understanding  of the importance of proper follow up care.   Clent Demark Kashira Behunin M.D. Diseases & Surgery of the Retina and Vitreous [facility]ina [facility] [place] 01/03/21     Abbreviations: M myopia (nearsighted); A astigmatism; H hyperopia (farsighted); P presbyopia; Mrx spectacle prescription;  CTL contact lenses; OD right eye; OS left eye; OU both eyes  XT exotropia; ET esotropia; PEK punctate epithelial keratitis; PEE punctate epithelial erosions; DES dry eye syndrome; MGD meibomian gland dysfunction; ATs artificial tears; PFAT's preservative free artificial tears; [place] nuclear sclerotic cataract; PSC posterior subcapsular cataract; ERM epi-retinal membrane; PVD posterior vitreous detachment; RD retinal detachment; DM diabetes mellitus; DR diabetic retinopathy; NPDR non-proliferative diabetic retinopathy; PDR proliferative diabetic retinopathy; CSME clinically significant macular edema; DME diabetic macular edema; dbh dot blot hemorrhages; CWS cotton wool spot; POAG primary open angle glaucoma; C/D cup-to-disc ratio; HVF humphrey visual field; GVF goldmann visual field; OCT optical coherence tomography; IOP intraocular pressure; BRVO Branch retinal vein occlusion; CRVO central retinal vein occlusion; CRAO central retinal artery occlusion; BRAO branch retinal artery occlusion; RT retinal tear; SB scleral buckle; PPV pars plana vitrectomy; VH Vitreous hemorrhage; PRP panretinal laser photocoagulation; IVK intravitreal kenalog; VMT vitreomacular traction; MH Macular hole;  NVD neovascularization of the disc; NVE neovascularization elsewhere; AREDS age related eye disease study; ARMD age related macular degeneration; POAG primary open angle glaucoma; EBMD epithelial/anterior basement membrane dystrophy; ACIOL anterior chamber intraocular lens; IOL intraocular lens; PCIOL posterior chamber intraocular lens; Phaco/IOL phacoemulsification with intraocular lens placement; [place] photorefractive keratectomy; LASIK laser assisted in  situ keratomileusis; HTN hypertension; DM diabetes mellitus; COPD chronic obstructive pulmonary disease

## 2021-01-03 NOTE — Assessment & Plan Note (Signed)
Atrophy accounts for acuity, no signs on clinical exam nor on OCT of active para macular CNVM OU Will observe

## 2021-01-14 DIAGNOSIS — R310 Gross hematuria: Secondary | ICD-10-CM | POA: Diagnosis not present

## 2021-01-18 ENCOUNTER — Telehealth: Payer: Self-pay | Admitting: Cardiology

## 2021-01-18 NOTE — Telephone Encounter (Signed)
Returned call and spoke with patient's wife, Izora Gala. Patient was seen by Dr. Lovena Neighbours, urology, on 5/20 for blood in urine and hgb was 12.8.  Wife reports that patient has continued to have blood in urine and was advised to call our office. She reports that Dr. Lovena Neighbours did not advise patient to stop Eliquis, but instead wanted him to call Dr. Martinique for advice. I advised patient's wife that I will forward message to Dr. Martinique as well as our pharmacists who help to manage our patients on anticoagulation. Izora Gala verbalized understanding and agreement with plan and thanked me for the call.

## 2021-01-18 NOTE — Telephone Encounter (Signed)
Called patient's wife, Izora Gala, and reviewed Dr. Doug Sou advice with her. I advised her to call back if bleeding returns after giving it time to resolve while off Eliquis. She verbalized understanding and thanked me for the call.

## 2021-01-18 NOTE — Telephone Encounter (Signed)
He may hold Eliquis until bleeding resolved. I communicated with Dr Lovena Neighbours.   Madora Barletta Martinique MD, [facility]

## 2021-01-18 NOTE — Telephone Encounter (Signed)
Patients nurse called and said that the saw urologist Dr. Lovena Neighbours and they suggested that he talk with Dr. Martinique or nurse in regards to the patient stop taking Eliquis. Patient has continued bleeding since Friday.

## 2021-01-20 DIAGNOSIS — E538 Deficiency of other specified B group vitamins: Secondary | ICD-10-CM | POA: Diagnosis not present

## 2021-01-20 DIAGNOSIS — E785 Hyperlipidemia, unspecified: Secondary | ICD-10-CM | POA: Diagnosis not present

## 2021-01-20 DIAGNOSIS — R7301 Impaired fasting glucose: Secondary | ICD-10-CM | POA: Diagnosis not present

## 2021-01-20 DIAGNOSIS — Z125 Encounter for screening for malignant neoplasm of prostate: Secondary | ICD-10-CM | POA: Diagnosis not present

## 2021-01-20 DIAGNOSIS — M859 Disorder of bone density and structure, unspecified: Secondary | ICD-10-CM | POA: Diagnosis not present

## 2021-01-20 DIAGNOSIS — Z0000 Encounter for general adult medical examination without abnormal findings: Secondary | ICD-10-CM | POA: Diagnosis not present

## 2021-01-27 DIAGNOSIS — C61 Malignant neoplasm of prostate: Secondary | ICD-10-CM | POA: Diagnosis not present

## 2021-01-27 DIAGNOSIS — R310 Gross hematuria: Secondary | ICD-10-CM | POA: Diagnosis not present

## 2021-01-27 DIAGNOSIS — R351 Nocturia: Secondary | ICD-10-CM | POA: Diagnosis not present

## 2021-01-31 DIAGNOSIS — I358 Other nonrheumatic aortic valve disorders: Secondary | ICD-10-CM | POA: Diagnosis not present

## 2021-01-31 DIAGNOSIS — D6869 Other thrombophilia: Secondary | ICD-10-CM | POA: Diagnosis not present

## 2021-01-31 DIAGNOSIS — Z0000 Encounter for general adult medical examination without abnormal findings: Secondary | ICD-10-CM | POA: Diagnosis not present

## 2021-01-31 DIAGNOSIS — M8580 Other specified disorders of bone density and structure, unspecified site: Secondary | ICD-10-CM | POA: Diagnosis not present

## 2021-01-31 DIAGNOSIS — I493 Ventricular premature depolarization: Secondary | ICD-10-CM | POA: Diagnosis not present

## 2021-01-31 DIAGNOSIS — E785 Hyperlipidemia, unspecified: Secondary | ICD-10-CM | POA: Diagnosis not present

## 2021-01-31 DIAGNOSIS — N401 Enlarged prostate with lower urinary tract symptoms: Secondary | ICD-10-CM | POA: Diagnosis not present

## 2021-01-31 DIAGNOSIS — M179 Osteoarthritis of knee, unspecified: Secondary | ICD-10-CM | POA: Diagnosis not present

## 2021-01-31 DIAGNOSIS — I480 Paroxysmal atrial fibrillation: Secondary | ICD-10-CM | POA: Diagnosis not present

## 2021-01-31 DIAGNOSIS — C61 Malignant neoplasm of prostate: Secondary | ICD-10-CM | POA: Diagnosis not present

## 2021-01-31 DIAGNOSIS — I739 Peripheral vascular disease, unspecified: Secondary | ICD-10-CM | POA: Diagnosis not present

## 2021-01-31 DIAGNOSIS — R82998 Other abnormal findings in urine: Secondary | ICD-10-CM | POA: Diagnosis not present

## 2021-01-31 DIAGNOSIS — I350 Nonrheumatic aortic (valve) stenosis: Secondary | ICD-10-CM | POA: Diagnosis not present

## 2021-02-02 DIAGNOSIS — Z23 Encounter for immunization: Secondary | ICD-10-CM | POA: Diagnosis not present

## 2021-02-08 DIAGNOSIS — C61 Malignant neoplasm of prostate: Secondary | ICD-10-CM | POA: Diagnosis not present

## 2021-02-15 DIAGNOSIS — R310 Gross hematuria: Secondary | ICD-10-CM | POA: Diagnosis not present

## 2021-02-15 DIAGNOSIS — C61 Malignant neoplasm of prostate: Secondary | ICD-10-CM | POA: Diagnosis not present

## 2021-02-16 DIAGNOSIS — H31013 Macula scars of posterior pole (postinflammatory) (post-traumatic), bilateral: Secondary | ICD-10-CM | POA: Diagnosis not present

## 2021-02-16 DIAGNOSIS — H401113 Primary open-angle glaucoma, right eye, severe stage: Secondary | ICD-10-CM | POA: Diagnosis not present

## 2021-02-16 DIAGNOSIS — Z947 Corneal transplant status: Secondary | ICD-10-CM | POA: Diagnosis not present

## 2021-02-16 DIAGNOSIS — H401123 Primary open-angle glaucoma, left eye, severe stage: Secondary | ICD-10-CM | POA: Diagnosis not present

## 2021-02-22 ENCOUNTER — Telehealth: Payer: Self-pay | Admitting: Cardiology

## 2021-02-22 ENCOUNTER — Other Ambulatory Visit (HOSPITAL_COMMUNITY): Payer: Self-pay | Admitting: *Deleted

## 2021-02-22 MED ORDER — AMOXICILLIN 500 MG PO CAPS: ORAL_CAPSULE | ORAL | 3 refills | Status: DC

## 2021-02-22 NOTE — Telephone Encounter (Signed)
Pt should not need to hold Eliquis for uncovery of single dental implant. He is also overdue for annual follow up and CBC/BMET for Eliquis monitoring. He does require pre op antibiotics though due to his history of AVR in 2011. Please let pt know I have sent in rx for amoxicillin to his pharmacy.

## 2021-02-22 NOTE — Telephone Encounter (Signed)
Spoke with patients wife and made her aware of the recommendations.   She voiced understanding but thought her husband should hold the Eliquis since the last time he had dental work he had 3 hematomas. I advised her that our pharmacist reviewed his chart and thinks its best for him to not hold the medication. She states the patient is currently holding his Eliquis due to a urine infection. I advised her that I would make the provider aware and she states she will do whatever they say. I advised her that we will fax over a copy of the recommendations the the patients periodontics office and make them aware as well.   She voiced understanding.   Follow up appointment scheduled with Tivis Ringer September.

## 2021-02-22 NOTE — Telephone Encounter (Signed)
   [facility] Pre-operative Risk Assessment    Request for surgical clearance:  What type of surgery is being performed? Uncovery of inplant  When is this surgery scheduled? 03/26/21  What type of clearance is required (medical clearance vs. Pharmacy clearance to hold med vs. Both)?  Pharmacy  Are there any medications that need to be held prior to surgery and how long? Eliquis   Practice name and name of physician performing surgery? [facility] Dr. Alwyn Pea  What is your office phone number [phone]   7.   What is your office fax number [phone]  8.   Anesthesia type (None, local, MAC, general) ? [place] 02/22/2021, 9:20 AM  _________________________________________________________________   (provider comments below)

## 2021-02-22 NOTE — Telephone Encounter (Signed)
   Name: Jason [name]  DOB: [birth year]  MRN: [id number]   Primary Cardiologist: Peter Martinique, MD  Chart reviewed as part of pre-operative protocol coverage.   Per our clinical pharmacist: Pt should not need to hold Eliquis for uncovery of single dental implant. He is also overdue for annual follow up and CBC/BMET for Eliquis monitoring. He does require pre op antibiotics though due to his history of AVR in 2011. Please let pt know I have sent in rx for amoxicillin to his pharmacy.  I will route this recommendation to the requesting party via Epic fax function and remove from pre-op pool. Please call with questions.  Tami Lin Cully Luckow, PA 02/22/2021, 3:25 PM

## 2021-02-23 ENCOUNTER — Ambulatory Visit (HOSPITAL_COMMUNITY)
Admission: RE | Admit: 2021-02-23 | Discharge: 2021-02-23 | Disposition: A | Discharge status: Home / Self Care | Payer: Medicare Other | Source: Ambulatory Visit | Attending: Internal Medicine | Admitting: Internal Medicine

## 2021-02-23 DIAGNOSIS — M810 Age-related osteoporosis without current pathological fracture: Secondary | ICD-10-CM | POA: Insufficient documentation

## 2021-02-23 MED ORDER — DENOSUMAB 60 MG/ML ~~LOC~~ SOSY: 60.0000 mg | PREFILLED_SYRINGE | Freq: Once | SUBCUTANEOUS | Status: AC

## 2021-02-23 MED ORDER — DENOSUMAB 60 MG/ML ~~LOC~~ SOSY
PREFILLED_SYRINGE | SUBCUTANEOUS | Status: AC
  Administered 2021-02-23: 60 mg via SUBCUTANEOUS
  Filled 2021-02-23: qty 1

## 2021-03-10 ENCOUNTER — Inpatient Hospital Stay: Payer: Medicare Other | Attending: Nurse Practitioner | Admitting: Oncology

## 2021-03-10 ENCOUNTER — Inpatient Hospital Stay: Payer: Medicare Other

## 2021-03-10 ENCOUNTER — Other Ambulatory Visit: Payer: Medicare Other

## 2021-03-10 ENCOUNTER — Other Ambulatory Visit: Payer: Self-pay

## 2021-03-10 VITALS — BP 106/59 | HR 79 | Temp 97.5°F | Resp 18

## 2021-03-10 DIAGNOSIS — C180 Malignant neoplasm of cecum: Secondary | ICD-10-CM | POA: Diagnosis not present

## 2021-03-10 DIAGNOSIS — C61 Malignant neoplasm of prostate: Secondary | ICD-10-CM | POA: Insufficient documentation

## 2021-03-10 LAB — CEA (ACCESS): CEA (CHCC): 7.36 ng/mL — ABNORMAL HIGH (ref 0.00–5.00)

## 2021-03-10 LAB — CEA (IN HOUSE-CHCC): CEA (CHCC-In House): 5.82 ng/mL — ABNORMAL HIGH (ref 0.00–5.00)

## 2021-03-10 NOTE — Progress Notes (Signed)
[place] OFFICE PROGRESS NOTE   Diagnosis: Colon cancer  INTERVAL HISTORY:   Jason [name] returns as scheduled.  He feels well.  Good appetite.  He reports a recent episode of hematuria felt to be related to anticoagulation therapy.  The hematuria resolved.  He is followed by Dr. [name] for prostate cancer.  The PSA remains elevated.  A bone scan in December 2021 revealed no evidence of metastatic disease.    Objective:  Vital signs in last 24 hours:  There were no vitals taken for this visit.    Lymphatics: No cervical, supraclavicular, axillary, or inguinal nodes Resp: Lungs clear bilaterally Cardio: Irregular GI: No hepatosplenomegaly, nontender, no mass Vascular: No leg edema  Lab Results:  Lab Results  Component Value Date   WBC 5.0 05/09/2017   HGB 13.7 05/09/2017   HCT 40.7 05/09/2017   MCV 109.4 (H) 05/09/2017   PLT 143 05/09/2017   NEUTROABS 2.8 05/09/2017    CMP  Lab Results  Component Value Date   NA 139 04/11/2017   K 3.8 04/11/2017   CL 104 11/01/2016   CO2 27 04/11/2017   GLUCOSE 97 04/11/2017   BUN 13.7 04/11/2017   CREATININE 1.0 04/11/2017   CALCIUM 9.4 04/11/2017   PROT 7.5 04/11/2017   ALBUMIN 3.9 04/11/2017   AST 22 04/11/2017   ALT 19 04/11/2017   ALKPHOS 54 04/11/2017   BILITOT 0.91 04/11/2017   GFRNONAA >60 11/01/2016   GFRAA >60 11/01/2016    Lab Results  Component Value Date   CEA1 5.25 (H) 07/13/2020   CEA 4.6 10/17/2016     Medications: I have reviewed the patient's current medications.   Assessment/Plan: Colon cancer, cecum, stage IIIB (T4a,N1b), status post a right colectomy 10/24/2016 3/23 lymph nodes positive for metastatic adenocarcinoma MSI-stable, no loss of mismatch repair protein expression Cycle 1 adjuvant Xeloda 11/20/2016 Cycle 2 adjuvant Xeloda 12/12/2016 (dose reduced due to mucositis following cycle 1) Cycle 3 adjuvant Xeloda 01/02/2017 Cycle 4 adjuvant Xeloda 01/23/2017 Cycle 5  adjuvant Xeloda 02/13/2017 Cycle 6 adjuvant Xeloda 03/06/2017 Cycle 7 adjuvant Xeloda 03/27/2017  Cycle 8 adjuvant Xeloda 04/17/2017 Colonoscopy on 03/11/2018-3 mm polyp in the descending colon (tubular adenoma, no high-grade dysplasia) CT abdomen/pelvis 10/17/2019-no evidence of metastatic disease   2.   History of iron deficiency anemia secondary to #1, status post IV iron therapy and multiple red cell transfusions; hemoglobin improved 11/16/2016; hemoglobin normal range 01/17/2017   3.   Prostate cancer followed by urology with observation Bone scan 04/24/2018- no change from comparison exam 10/19/2017.  No evidence of skeletal metastasis. Bone scan 10/08/2019-questionable new focus of uptake and a posterior left rib, otherwise stable Bone scan 08/11/2020-stable degenerative changes, no evidence of metastatic disease   4.  History of coronary artery disease, status post coronary artery bypass surgery 2011   5.  Aortic valve replacement 2011   6.  Atrial fibrillation-previously maintained on Coumadin; switched to Eliquis due to known interaction between Coumadin and Xeloda   7.  Glaucoma   8.  Mucositis secondary to Xeloda following cycle 1; Xeloda dose reduced beginning with cycle 2.     Disposition: Jason [name] remains in clinical remission from colon cancer.  We will follow-up on the CEA from today.  He would like to continue follow-up at the Cancer center.  He will return for an office visit and CEA in February 2023.  He will continue follow-up with Dr. [name] for management of prostate cancer.  Betsy Coder, MD  03/10/2021  9:30 AM

## 2021-03-11 ENCOUNTER — Telehealth: Payer: Self-pay

## 2021-03-11 NOTE — Telephone Encounter (Signed)
the CEA is mildly elevated and stable, follow-up as scheduled

## 2021-03-27 DIAGNOSIS — E785 Hyperlipidemia, unspecified: Secondary | ICD-10-CM | POA: Diagnosis not present

## 2021-03-27 DIAGNOSIS — M5136 Other intervertebral disc degeneration, lumbar region: Secondary | ICD-10-CM | POA: Diagnosis not present

## 2021-03-27 DIAGNOSIS — G4733 Obstructive sleep apnea (adult) (pediatric): Secondary | ICD-10-CM | POA: Diagnosis not present

## 2021-05-10 ENCOUNTER — Encounter: Payer: Self-pay | Admitting: Physician Assistant

## 2021-05-10 ENCOUNTER — Ambulatory Visit (INDEPENDENT_AMBULATORY_CARE_PROVIDER_SITE_OTHER): Payer: Medicare Other | Admitting: Physician Assistant

## 2021-05-10 ENCOUNTER — Other Ambulatory Visit: Payer: Self-pay

## 2021-05-10 VITALS — BP 108/60 | HR 69 | Resp 20 | Ht 70.0 in | Wt 165.2 lb

## 2021-05-10 DIAGNOSIS — I4821 Permanent atrial fibrillation: Secondary | ICD-10-CM

## 2021-05-10 DIAGNOSIS — E785 Hyperlipidemia, unspecified: Secondary | ICD-10-CM | POA: Diagnosis not present

## 2021-05-10 DIAGNOSIS — Z952 Presence of prosthetic heart valve: Secondary | ICD-10-CM

## 2021-05-10 DIAGNOSIS — I25810 Atherosclerosis of coronary artery bypass graft(s) without angina pectoris: Secondary | ICD-10-CM | POA: Diagnosis not present

## 2021-05-10 NOTE — Progress Notes (Signed)
Cardiology Office Note:    Date:  05/12/2021   ID:  Jason [name], DOB 10/09/1925, MRN [id number]  PCP:  [name], MD   [facility] Providers Cardiologist:  Peter Martinique, MD Electrophysiologist:  Constance Haw, MD     Referring MD: [name], MD   Chief Complaint  Patient presents with   Follow-up    Seen for Dr. Martinique    History of Present Illness:    Jason [name] is a 87 y.o. male with a hx of CAD s/p CABG with AVR in October 2011, history of prostate cancer, hyperlipidemia, OSA on CPAP and chronic atrial fibrillation.  When he had AVR with tissue valve prosthesis, he underwent LIMA to LAD.  At the time, RCA had a 40 to 50% stenosis that was treated medically.  He has been anticoagulated with Xarelto. He was admitted in January 2016 with GI bleed.  His Xarelto was held.  He did not require blood transfusion.  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  Afterward, he was placed on Coumadin.  He had recurrent anemia in May 2017 and required blood transfusion.  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  Afterward, he returned with complaint of GI bleed.  He was evaluated by Dr. Curt Bears in March 2018, beta-blocker was increased.  There was also some wide-complex tachycardia consistent with aberrancy versus VT.  He was transition to Eliquis.  I last saw the patient via virtual visit in January 2021 at which time he was residing in [place] with his wife.  Patient presents today along with his wife, they are still living in friends home [place].  He denies any recent exertional chest pain or worsening shortness of breath.  He is exercising on a daily basis in the gym.  Most recent blood work obtained by PCP in May 2022 continue to show low HDL, however very well controlled total cholesterol, LDL and triglyceride.  Heart rate is very well controlled on the current medication.  He has not taking 25 mg twice a day of metoprolol,  instead he is taking 12.5 mg twice a day dosing.  Otherwise, he is doing well from the cardiac perspective and can follow-up with Dr. Martinique in 6 months.   Past Medical History:  Diagnosis Date   Abnormal PSA    Adenomatous polyp of colon 2010 and before 2006   Anemia    Aortic stenosis    Aortic valve prosthesis present    Atrial fibrillation (HCC)    CAD (coronary artery disease)    DDD (degenerative disc disease), lumbar    Depression    Diverticulosis    Dyslipidemia    GERD (gastroesophageal reflux disease)    Glaucoma    History of blood transfusion 09/29/2016   Hx of CABG    Hypogonadism, male    Macular degeneration    OSA (obstructive sleep apnea)    Osteoarthritis    Osteopenia    Prostate CA ([place]) prostate 2007   colon dx 2018, watching psa levels, no tx yet for prostate    Past Surgical History:  Procedure Laterality Date   AORTIC VALVE REPLACEMENT  October 2011   Magna Ease pericardial tissue valve #22m   BACK SURGERY  1978   lower   CATARACT EXTRACTION Bilateral    CORNEAL TRANSPLANT Right    CORONARY ARTERY BYPASS GRAFT  October 2011   LIMA to LAD   ESOPHAGOGASTRODUODENOSCOPY N/A 09/16/2014   Procedure:  ESOPHAGOGASTRODUODENOSCOPY (EGD);  Surgeon: [name], MD;  Location: [facility] [place] ENDOSCOPY;  Service: Endoscopy;  Laterality: N/A;   TONSILLECTOMY  76 yrs ago    Current Medications: Current Meds  Medication Sig   acetaminophen (TYLENOL) 325 MG tablet Take 650 mg by mouth every 6 (six) hours as needed.   amitriptyline (ELAVIL) 25 MG tablet Take 25 mg by mouth daily.   amoxicillin (AMOXIL) 500 MG capsule Take 4 capsules by mouth 30-60 minutes prio to dental work   apixaban (ELIQUIS) 5 MG TABS tablet Take 5 mg by mouth 2 (two) times daily.   brimonidine (ALPHAGAN) 0.2 % ophthalmic solution Place 1 drop into the left eye 3 (three) times daily.    Calcium Carbonate-Vitamin D (CALCIUM-VITAMIN D) 500-200 MG-UNIT per tablet Take 1 tablet by mouth daily.    Cholecalciferol 1000 UNITS capsule Take 1,000 Units by mouth daily.    diclofenac Sodium (VOLTAREN) 1 % GEL Apply topically in the morning and at bedtime.   dorzolamide-timolol (COSOPT) 22.3-6.8 MG/ML ophthalmic solution Place 1 drop into both eyes 2 (two) times daily.   fluticasone (FLONASE) 50 MCG/ACT nasal spray Place 1 spray into both nostrils daily as needed for allergies or rhinitis.    gabapentin (NEURONTIN) 300 MG capsule Take 300 mg by mouth 3 (three) times daily.   latanoprost (XALATAN) 0.005 % ophthalmic solution Place 1 drop into both eyes daily.   metoprolol tartrate (LOPRESSOR) 25 MG tablet Take 1 tablet (25 mg total) by mouth 2 (two) times daily. (Patient taking differently: Take 12.5 mg by mouth 2 (two) times daily.)   Multiple Vitamin (MULTIVITAMIN WITH MINERALS) TABS tablet Take 1 tablet by mouth daily.   pantoprazole (PROTONIX) 40 MG tablet Take 40 mg by mouth daily.   perphenazine (TRILAFON) 2 MG tablet Take by mouth.   prednisoLONE acetate (PRED FORTE) 1 % ophthalmic suspension Place 1 drop into the right eye every other day.    psyllium (METAMUCIL) 58.6 % powder Take 1 packet by mouth daily.   rosuvastatin (CRESTOR) 5 MG tablet Take 5 mg by mouth every other day.    vitamin B-12 (CYANOCOBALAMIN) 1000 MCG tablet Take 1,000 mcg by mouth daily.     Allergies:   Xarelto [rivaroxaban]   Social History   Socioeconomic History   Marital status: Married    Spouse name: Izora Gala   Number of children: 2   Years of education: Not on file   Highest education level: Not on file  Occupational History   Not on file  Tobacco Use   Smoking status: Former    Packs/day: 1.00    Years: 40.00    Pack years: 40.00    Types: Cigarettes    Quit date: 11/16/1990    Years since quitting: 30.5   Smokeless tobacco: Never  Vaping Use   Vaping Use: Never used  Substance and Sexual Activity   Alcohol use: Yes    Alcohol/week: 2.0 standard drinks    Types: 2 Glasses of wine per week     Comment: wine few x per week   Drug use: No   Sexual activity: Not on file  Other Topics Concern   Not on file  Social History Narrative   Not on file   Social Determinants of Health   Financial Resource Strain: Not on file  Food Insecurity: Not on file  Transportation Needs: Not on file  Physical Activity: Not on file  Stress: Not on file  Social Connections: Not on file  Family History: The patient's family history includes Heart attack in his father. There is no history of Colon cancer, Esophageal cancer, Stomach cancer, or Rectal cancer.  ROS:   Please see the history of present illness.     All other systems reviewed and are negative.  EKGs/Labs/Other Studies Reviewed:    The following studies were reviewed today:  Echo 07/17/2014 LV EF: 55% -   60%  Study Conclusions   - Left ventricle: The cavity size was normal. Wall thickness was    increased in a pattern of mild LVH. There was mild concentric    hypertrophy. Systolic function was normal. The estimated ejection    fraction was in the range of 55% to 60%. Wall motion was normal;    there were no regional wall motion abnormalities.  - Ventricular septum: Septal motion showed paradox.  - Aortic valve: A bioprosthesis was present and functioning    normally. Valve area (VTI): 1.6 cm^2. Valve area (Vmax): 1.5    cm^2. Valve area (Vmean): 1.33 cm^2.  - Mitral valve: Calcified, moderately to severely fibrotic annulus.    Mildly thickened leaflets . The findings are consistent with    trivial stenosis. There was mild regurgitation directed    centrally. Valve area by continuity equation (using LVOT flow):    2.6 cm^2.  - Left atrium: The atrium was moderately dilated.  - Right atrium: The atrium was mildly dilated.   EKG:  EKG is ordered today.  The ekg ordered today demonstrates atrial fibrillation, heart rate 69, poor R wave progression in the anterior leads.  Recent Labs: No results found for requested labs  within last 8760 hours.  Recent Lipid Panel    Component Value Date/Time   CHOL 89 01/03/2012 0835   TRIG 125.0 01/03/2012 0835   HDL 37.40 (L) 01/03/2012 0835   CHOLHDL 2 01/03/2012 0835   VLDL 25.0 01/03/2012 0835   LDLCALC 27 01/03/2012 0835     Risk Assessment/Calculations:    CHA2DS2-VASc Score = 3   This indicates a 3.2% annual risk of stroke. The patient's score is based upon: CHF History: 0 HTN History: 0 Diabetes History: 0 Stroke History: 0 Vascular Disease History: 1 Age Score: 2 Gender Score: 0          Physical Exam:    VS:  BP 108/60 (BP Location: Left Arm, Patient Position: Sitting, Cuff Size: Normal)   Pulse 69   Resp 20   Ht '5\' 10"'$  (1.778 m)   Wt 165 lb 3.2 oz (74.9 kg)   SpO2 92%   BMI 23.70 kg/m     Wt Readings from Last 3 Encounters:  05/10/21 165 lb 3.2 oz (74.9 kg)  07/13/20 173 lb 14.4 oz (78.9 kg)  11/11/19 179 lb 4.8 oz (81.3 kg)     GEN:  Well nourished, well developed in no acute distress HEENT: Normal NECK: No JVD; No carotid bruits LYMPHATICS: No lymphadenopathy CARDIAC: Irregularly irregular, no murmurs, rubs, gallops RESPIRATORY:  Clear to auscultation without rales, wheezing or rhonchi  ABDOMEN: Soft, non-tender, non-distended MUSCULOSKELETAL:  No edema; No deformity  SKIN: Warm and dry NEUROLOGIC:  Alert and oriented x 3 PSYCHIATRIC:  Normal affect   ASSESSMENT:    1. Permanent atrial fibrillation ([place])   2. Coronary artery disease involving coronary bypass graft of native heart without angina pectoris   3. S/P AVR (aortic valve replacement)   4. Hyperlipidemia LDL goal <70    PLAN:    In order of  problems listed above:  Permanent atrial fibrillation: Continue Eliquis and metoprolol.  He is not on 25 mg twice a day of metoprolol, instead he is taking 12.5 mg twice a day.  Heart rate seems to be very well controlled.  He  CAD s/p CABG: Denies any recent chest pain.  Not on aspirin given the need for  Eliquis.  History of AVR: No significant heart murmur on physical exam  Hyperlipidemia: Continue Crestor.         Medication Adjustments/Labs and Tests Ordered: Current medicines are reviewed at length with the patient today.  Concerns regarding medicines are outlined above.  Orders Placed This Encounter  Procedures   EKG 12-Lead   No orders of the defined types were placed in this encounter.   Patient Instructions  Medication Instructions:  Your physician recommends that you continue on your current medications as directed. Please refer to the Current Medication list given to you today.  *If you need a refill on your cardiac medications before your next appointment, please call your pharmacy*  Lab Work: NONE ordered at this time of appointment   If you have labs (blood work) drawn today and your tests are completely normal, you will receive your results only by: [place] (if you have MyChart) OR A paper copy in the mail If you have any lab test that is abnormal or we need to change your treatment, we will call you to review the results.  Testing/Procedures: NONE ordered at this time of appointment   Follow-Up: At [facility], you and your health needs are our priority.  As part of our continuing mission to provide you with exceptional heart care, we have created designated Provider Care Teams.  These Care Teams include your primary Cardiologist (physician) and Advanced Practice Providers (APPs -  Physician Assistants and Nurse Practitioners) who all work together to provide you with the care you need, when you need it.  Your next appointment:   6 month(s)  The format for your next appointment:   In Person  Provider:   Peter Martinique, MD  Other Instructions    Signed, Almyra Deforest, [place]  05/12/2021 11:05 PM    [place]

## 2021-05-10 NOTE — Patient Instructions (Addendum)

## 2021-05-12 ENCOUNTER — Encounter: Payer: Self-pay | Admitting: Physician Assistant

## 2021-05-18 DIAGNOSIS — Z23 Encounter for immunization: Secondary | ICD-10-CM | POA: Diagnosis not present

## 2021-05-30 DIAGNOSIS — H65191 Other acute nonsuppurative otitis media, right ear: Secondary | ICD-10-CM | POA: Diagnosis not present

## 2021-05-30 DIAGNOSIS — I480 Paroxysmal atrial fibrillation: Secondary | ICD-10-CM | POA: Diagnosis not present

## 2021-05-30 DIAGNOSIS — Z7901 Long term (current) use of anticoagulants: Secondary | ICD-10-CM | POA: Diagnosis not present

## 2021-06-02 DIAGNOSIS — Z23 Encounter for immunization: Secondary | ICD-10-CM | POA: Diagnosis not present

## 2021-06-07 DIAGNOSIS — J300 Vasomotor rhinitis: Secondary | ICD-10-CM | POA: Diagnosis not present

## 2021-06-07 DIAGNOSIS — H6121 Impacted cerumen, right ear: Secondary | ICD-10-CM | POA: Diagnosis not present

## 2021-06-07 DIAGNOSIS — H61891 Other specified disorders of right external ear: Secondary | ICD-10-CM | POA: Diagnosis not present

## 2021-06-07 DIAGNOSIS — H9211 Otorrhea, right ear: Secondary | ICD-10-CM | POA: Diagnosis not present

## 2021-06-22 DIAGNOSIS — R300 Dysuria: Secondary | ICD-10-CM | POA: Diagnosis not present

## 2021-06-22 DIAGNOSIS — N3001 Acute cystitis with hematuria: Secondary | ICD-10-CM | POA: Diagnosis not present

## 2021-06-22 DIAGNOSIS — I480 Paroxysmal atrial fibrillation: Secondary | ICD-10-CM | POA: Diagnosis not present

## 2021-06-22 DIAGNOSIS — C61 Malignant neoplasm of prostate: Secondary | ICD-10-CM | POA: Diagnosis not present

## 2021-06-24 DIAGNOSIS — N401 Enlarged prostate with lower urinary tract symptoms: Secondary | ICD-10-CM | POA: Diagnosis not present

## 2021-06-24 DIAGNOSIS — R972 Elevated prostate specific antigen [PSA]: Secondary | ICD-10-CM | POA: Diagnosis not present

## 2021-06-24 DIAGNOSIS — R3912 Poor urinary stream: Secondary | ICD-10-CM | POA: Diagnosis not present

## 2021-06-24 DIAGNOSIS — R310 Gross hematuria: Secondary | ICD-10-CM | POA: Diagnosis not present

## 2021-06-27 DIAGNOSIS — C61 Malignant neoplasm of prostate: Secondary | ICD-10-CM | POA: Diagnosis not present

## 2021-06-27 DIAGNOSIS — R310 Gross hematuria: Secondary | ICD-10-CM | POA: Diagnosis not present

## 2021-06-27 DIAGNOSIS — E785 Hyperlipidemia, unspecified: Secondary | ICD-10-CM | POA: Diagnosis not present

## 2021-06-27 DIAGNOSIS — N200 Calculus of kidney: Secondary | ICD-10-CM | POA: Diagnosis not present

## 2021-06-27 DIAGNOSIS — M5136 Other intervertebral disc degeneration, lumbar region: Secondary | ICD-10-CM | POA: Diagnosis not present

## 2021-06-27 DIAGNOSIS — N400 Enlarged prostate without lower urinary tract symptoms: Secondary | ICD-10-CM | POA: Diagnosis not present

## 2021-06-27 DIAGNOSIS — G4733 Obstructive sleep apnea (adult) (pediatric): Secondary | ICD-10-CM | POA: Diagnosis not present

## 2021-06-27 DIAGNOSIS — K5730 Diverticulosis of large intestine without perforation or abscess without bleeding: Secondary | ICD-10-CM | POA: Diagnosis not present

## 2021-06-28 ENCOUNTER — Telehealth: Payer: Self-pay | Admitting: Cardiology

## 2021-06-28 NOTE — Telephone Encounter (Signed)
Spoke with patient's wife. She stated patient was not taking eliquis for 4-5 days due to hematuria. The urine became clear.  He started back on eliquis yesterday and took a dose today, resulting in hematuria. Patient is completing Cipro prescription today and is trying to get an appointment with the urologist. Wife wants to know what to do about patient taking the eliquis.

## 2021-06-28 NOTE — Telephone Encounter (Signed)
Have patient hold Eliquis for 2 more days, then re-start.  Please follow up with urologist

## 2021-06-28 NOTE — Telephone Encounter (Signed)
Pt c/o medication issue:  1. Name of Medication: apixaban (ELIQUIS) 5 MG TABS tablet  2. How are you currently taking this medication (dosage and times per day)? Take 5 mg by mouth 2 (two) times daily.  3. Are you having a reaction (difficulty breathing--STAT)? Urinary bleeding  4. What is your medication issue? Pt is having urinary bleeding on and off. Wife states after a few days of the medication pt urine was clear. PT started taking med again and was bleeding within two hours

## 2021-06-29 NOTE — Telephone Encounter (Signed)
Relayed message to patient from [facility] as noted below

## 2021-07-07 DIAGNOSIS — Z20822 Contact with and (suspected) exposure to covid-19: Secondary | ICD-10-CM | POA: Diagnosis not present

## 2021-07-08 DIAGNOSIS — R3914 Feeling of incomplete bladder emptying: Secondary | ICD-10-CM | POA: Diagnosis not present

## 2021-07-08 DIAGNOSIS — R310 Gross hematuria: Secondary | ICD-10-CM | POA: Diagnosis not present

## 2021-07-14 DIAGNOSIS — J300 Vasomotor rhinitis: Secondary | ICD-10-CM | POA: Diagnosis not present

## 2021-07-14 DIAGNOSIS — H9221 Otorrhagia, right ear: Secondary | ICD-10-CM | POA: Diagnosis not present

## 2021-07-20 DIAGNOSIS — Z947 Corneal transplant status: Secondary | ICD-10-CM | POA: Diagnosis not present

## 2021-07-20 DIAGNOSIS — H31013 Macula scars of posterior pole (postinflammatory) (post-traumatic), bilateral: Secondary | ICD-10-CM | POA: Diagnosis not present

## 2021-07-20 DIAGNOSIS — H401123 Primary open-angle glaucoma, left eye, severe stage: Secondary | ICD-10-CM | POA: Diagnosis not present

## 2021-07-20 DIAGNOSIS — H401113 Primary open-angle glaucoma, right eye, severe stage: Secondary | ICD-10-CM | POA: Diagnosis not present

## 2021-08-03 DIAGNOSIS — I480 Paroxysmal atrial fibrillation: Secondary | ICD-10-CM | POA: Diagnosis not present

## 2021-08-03 DIAGNOSIS — N3001 Acute cystitis with hematuria: Secondary | ICD-10-CM | POA: Diagnosis not present

## 2021-08-03 DIAGNOSIS — K219 Gastro-esophageal reflux disease without esophagitis: Secondary | ICD-10-CM | POA: Diagnosis not present

## 2021-08-03 DIAGNOSIS — R7301 Impaired fasting glucose: Secondary | ICD-10-CM | POA: Diagnosis not present

## 2021-08-03 DIAGNOSIS — C61 Malignant neoplasm of prostate: Secondary | ICD-10-CM | POA: Diagnosis not present

## 2021-08-03 DIAGNOSIS — D500 Iron deficiency anemia secondary to blood loss (chronic): Secondary | ICD-10-CM | POA: Diagnosis not present

## 2021-08-03 DIAGNOSIS — E785 Hyperlipidemia, unspecified: Secondary | ICD-10-CM | POA: Diagnosis not present

## 2021-08-05 DIAGNOSIS — R310 Gross hematuria: Secondary | ICD-10-CM | POA: Diagnosis not present

## 2021-08-08 DIAGNOSIS — R310 Gross hematuria: Secondary | ICD-10-CM | POA: Diagnosis not present

## 2021-09-01 DIAGNOSIS — M25512 Pain in left shoulder: Secondary | ICD-10-CM | POA: Diagnosis not present

## 2021-09-01 DIAGNOSIS — M542 Cervicalgia: Secondary | ICD-10-CM | POA: Diagnosis not present

## 2021-09-06 DIAGNOSIS — C61 Malignant neoplasm of prostate: Secondary | ICD-10-CM | POA: Diagnosis not present

## 2021-09-12 DIAGNOSIS — C61 Malignant neoplasm of prostate: Secondary | ICD-10-CM | POA: Diagnosis not present

## 2021-09-12 DIAGNOSIS — R3914 Feeling of incomplete bladder emptying: Secondary | ICD-10-CM | POA: Diagnosis not present

## 2021-09-12 DIAGNOSIS — R310 Gross hematuria: Secondary | ICD-10-CM | POA: Diagnosis not present

## 2021-09-20 ENCOUNTER — Ambulatory Visit (INDEPENDENT_AMBULATORY_CARE_PROVIDER_SITE_OTHER): Payer: Medicare Other | Admitting: Ophthalmology

## 2021-09-20 ENCOUNTER — Other Ambulatory Visit: Payer: Self-pay

## 2021-09-20 ENCOUNTER — Encounter (INDEPENDENT_AMBULATORY_CARE_PROVIDER_SITE_OTHER): Payer: Medicare Other | Admitting: Ophthalmology

## 2021-09-20 ENCOUNTER — Encounter (INDEPENDENT_AMBULATORY_CARE_PROVIDER_SITE_OTHER): Payer: Self-pay | Admitting: Ophthalmology

## 2021-09-20 DIAGNOSIS — H353221 Exudative age-related macular degeneration, left eye, with active choroidal neovascularization: Secondary | ICD-10-CM

## 2021-09-20 DIAGNOSIS — H353212 Exudative age-related macular degeneration, right eye, with inactive choroidal neovascularization: Secondary | ICD-10-CM

## 2021-09-20 DIAGNOSIS — H353222 Exudative age-related macular degeneration, left eye, with inactive choroidal neovascularization: Secondary | ICD-10-CM

## 2021-09-20 DIAGNOSIS — Z947 Corneal transplant status: Secondary | ICD-10-CM | POA: Diagnosis not present

## 2021-09-20 DIAGNOSIS — H401133 Primary open-angle glaucoma, bilateral, severe stage: Secondary | ICD-10-CM

## 2021-09-20 DIAGNOSIS — H353134 Nonexudative age-related macular degeneration, bilateral, advanced atrophic with subfoveal involvement: Secondary | ICD-10-CM | POA: Diagnosis not present

## 2021-09-20 NOTE — Assessment & Plan Note (Signed)
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

## 2021-09-20 NOTE — Assessment & Plan Note (Signed)
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

## 2021-09-20 NOTE — Progress Notes (Signed)
09/20/2021     CHIEF COMPLAINT Patient presents for  Chief Complaint  Patient presents with   Retina Follow Up      HISTORY OF PRESENT ILLNESS: Jason [name] is a 88 y.o. male who presents to the clinic today for:   HPI     Retina Follow Up           Diagnosis: Other   Laterality: both eyes   Onset: 8 months ago   Severity: mild   Duration: 8   Course: gradually worsening         Comments   8 mos fu OU oct FP- decreased va OU moved up 1 mo. Pt states "I have noticed it gradually worsening the past couple months." Wife states "sometimes he can't see at all." Pt is using latanoprost OU QHS, brimonidine OU BID, dorz-tim BID OU. Pt states is also using prednisolone OD QD.   Patient had appointment yesterday with corneal specialist, Dr. Susa Simmonds, which was postponed till February 2023      Last edited by Hurman Horn, MD on 09/20/2021  9:27 AM.      Referring physician: [name], [place],  [place] [address]  HISTORICAL INFORMATION:   Selected notes from the MEDICAL RECORD NUMBER    Lab Results  Component Value Date   HGBA1C 5.5 10/17/2016     CURRENT MEDICATIONS: Current Outpatient Medications (Ophthalmic Drugs)  Medication Sig   brimonidine (ALPHAGAN) 0.2 % ophthalmic solution Place 1 drop into the left eye 3 (three) times daily.    dorzolamide-timolol (COSOPT) 22.3-6.8 MG/ML ophthalmic solution Place 1 drop into both eyes 2 (two) times daily.   latanoprost (XALATAN) 0.005 % ophthalmic solution Place 1 drop into both eyes daily.   prednisoLONE acetate (PRED FORTE) 1 % ophthalmic suspension Place 1 drop into the right eye every other day.    No current facility-administered medications for this visit. (Ophthalmic Drugs)   Current Outpatient Medications (Other)  Medication Sig   acetaminophen (TYLENOL) 325 MG tablet Take 650 mg by mouth every 6 (six) hours as needed.   amitriptyline (ELAVIL) 25 MG tablet Take 25 mg by mouth  daily.   amoxicillin (AMOXIL) 500 MG capsule Take 4 capsules by mouth 30-60 minutes prio to dental work   apixaban (ELIQUIS) 5 MG TABS tablet Take 5 mg by mouth 2 (two) times daily.   Calcium Carbonate-Vitamin D (CALCIUM-VITAMIN D) 500-200 MG-UNIT per tablet Take 1 tablet by mouth daily.   Cholecalciferol 1000 UNITS capsule Take 1,000 Units by mouth daily.    diclofenac Sodium (VOLTAREN) 1 % GEL Apply topically in the morning and at bedtime.   fluticasone (FLONASE) 50 MCG/ACT nasal spray Place 1 spray into both nostrils daily as needed for allergies or rhinitis.    gabapentin (NEURONTIN) 300 MG capsule Take 300 mg by mouth 3 (three) times daily.   metoprolol tartrate (LOPRESSOR) 25 MG tablet Take 1 tablet (25 mg total) by mouth 2 (two) times daily. (Patient taking differently: Take 12.5 mg by mouth 2 (two) times daily.)   Multiple Vitamin (MULTIVITAMIN WITH MINERALS) TABS tablet Take 1 tablet by mouth daily.   pantoprazole (PROTONIX) 40 MG tablet Take 40 mg by mouth daily.   perphenazine (TRILAFON) 2 MG tablet Take by mouth.   psyllium (METAMUCIL) 58.6 % powder Take 1 packet by mouth daily.   rosuvastatin (CRESTOR) 5 MG tablet Take 5 mg by mouth every other day.    vitamin B-12 (CYANOCOBALAMIN) 1000 MCG  tablet Take 1,000 mcg by mouth daily.   No current facility-administered medications for this visit. (Other)      REVIEW OF SYSTEMS:    ALLERGIES Allergies  Allergen Reactions   Xarelto [Rivaroxaban]     GI BLEED    PAST MEDICAL HISTORY Past Medical History:  Diagnosis Date   Abnormal PSA    Adenomatous polyp of colon 2010 and before 2006   Anemia    Aortic stenosis    Aortic valve prosthesis present    Atrial fibrillation (HCC)    CAD (coronary artery disease)    DDD (degenerative disc disease), lumbar    Depression    Diverticulosis    Dyslipidemia    GERD (gastroesophageal reflux disease)    Glaucoma    History of blood transfusion 09/29/2016   Hx of CABG     Hypogonadism, male    Macular degeneration    OSA (obstructive sleep apnea)    Osteoarthritis    Osteopenia    Prostate CA ([place]) prostate 2007   colon dx 2018, watching psa levels, no tx yet for prostate   Past Surgical History:  Procedure Laterality Date   AORTIC VALVE REPLACEMENT  October 2011   Magna Ease pericardial tissue valve #19mm   BACK SURGERY  1978   lower   CATARACT EXTRACTION Bilateral    CORNEAL TRANSPLANT Right    CORONARY ARTERY BYPASS GRAFT  October 2011   LIMA to LAD   ESOPHAGOGASTRODUODENOSCOPY N/A 09/16/2014   Procedure: ESOPHAGOGASTRODUODENOSCOPY (EGD);  Surgeon: [name], MD;  Location: [facility] ENDOSCOPY;  Service: Endoscopy;  Laterality: N/A;   TONSILLECTOMY  57 yrs ago    FAMILY HISTORY Family History  Problem Relation Age of Onset   Heart attack Father    Colon cancer Neg Hx    Esophageal cancer Neg Hx    Stomach cancer Neg Hx    Rectal cancer Neg Hx     SOCIAL HISTORY Social History   Tobacco Use   Smoking status: Former    Packs/day: 1.00    Years: 40.00    Pack years: 40.00    Types: Cigarettes    Quit date: 11/16/1990    Years since quitting: 30.8   Smokeless tobacco: Never  Vaping Use   Vaping Use: Never used  Substance Use Topics   Alcohol use: Yes    Alcohol/week: 2.0 standard drinks    Types: 2 Glasses of wine per week    Comment: wine few x per week   Drug use: No         OPHTHALMIC EXAM:  Base Eye Exam     Visual Acuity (ETDRS)       Right Left   Dist cc CF at 4' CF at 3'   Dist ph cc NI NI    Correction: Glasses         Tonometry (Tonopen, 8:58 AM)       Right Left   Pressure 15 14         Pachymetry (01/03/2021)       Right Left   Thickness 11 12         Pupils       Dark Light Shape React APD   Right   Irregular Minimal None   Left 4 4 Round Minimal None         Visual Fields (Counting fingers)       Left Right   Restrictions Partial outer superior temporal deficiency Partial outer  superior  temporal, inferior temporal, superior nasal, inferior nasal deficiencies         Extraocular Movement       Right Left    Full Full         Neuro/Psych     Oriented x3: Yes   Mood/Affect: Normal         Dilation     Both eyes: 1.0% Mydriacyl, 2.5% Phenylephrine @ 8:58 AM           Slit Lamp and Fundus Exam     External Exam       Right Left   External Normal Normal         Slit Lamp Exam       Right Left   Lids/Lashes Normal Normal   Conjunctiva/Sclera White and quiet White and quiet   Cornea Central PK, no cells on endothelium, decent clarity, some SPK of the epithelium, no infiltrate Clear   Anterior Chamber Deep and quiet Deep and quiet   Iris Round and reactive Round and reactive   Lens Posterior chamber intraocular lens Posterior chamber intraocular lens   Anterior Vitreous Normal Normal         Fundus Exam       Right Left   Posterior Vitreous Posterior vitreous detachment Normal   Disc hazy view thru ant. segment, Thin rim, Peripapillary atrophy Peripapillary atrophy   C/D Ratio 0.85 0.85   Macula Retinal pigment epithelial mottling, Disciform scar, Hard drusen, Advanced age related macular degeneration, Geographic atrophy into the fovea 1.5 DA size, macular region Retinal pigment epithelial mottling, Disciform scar, Hard drusen, Advanced age related macular degeneration, Geographic atrophy encompasses the FAZ, overall 7-8 disc area size of atrophy., , no hemorrhage, no exudates, view clear   Vessels Normal Normal   Periphery Normal Normal            IMAGING AND PROCEDURES  Imaging and Procedures for 09/20/21  OCT, Retina - OU - Both Eyes       Right Eye Quality was good. Scan locations included subfoveal. Central Foveal Thickness: 175. Progression has been stable. Findings include central retinal atrophy, outer retinal atrophy, inner retinal atrophy, abnormal foveal contour, disciform scar, no IRF, no SRF.   Left  Eye Quality was good. Scan locations included subfoveal. Central Foveal Thickness: 225. Progression has improved. Findings include central retinal atrophy, outer retinal atrophy, subretinal scarring, abnormal foveal contour, disciform scar, no IRF, no SRF.   Notes OD with media opacity, yet foveal atrophy accounts for acuity, no signs of paramacular CNVM activity  OS with no active CNVM.  Previously active area temporal to the fovea has no signs of intraretinal or subretinal fluid.  Foveal atrophy accounts for the acuity.  Overall stable at 20 months post last antiVEGF     Color Fundus Photography Optos - OU - Both Eyes       Right Eye Progression has been stable. Disc findings include increased cup to disc ratio. Macula : geographic atrophy. Vessels : normal observations. Periphery : normal observations.   Left Eye Progression has worsened. Disc findings include increased cup to disc ratio. Macula : geographic atrophy. Vessels : normal observations. Periphery : normal observations.   Notes No signs of active CNVM OU.             ASSESSMENT/PLAN:  Primary open angle glaucoma of both eyes, severe stage Ongoing follow-up with Dr. Thurmond Butts  Exudative age-related macular degeneration of right eye with inactive choroidal neovascularization ([place]) Old disciform,  but vision is limited by central foveal geographic atrophy  Advanced nonexudative age-related macular degeneration of both eyes with subfoveal involvement Likely progression into the FAZ region from Geographic atrophy accounting for the patient's notable change in acuity  History of corneal transplant Largely clear graft, no signs of rejection, no cellular collections or infiltrates of the endothelium  Follow-up Dr. Susa Simmonds as scheduled  Exudative age-related macular degeneration of left eye with inactive choroidal neovascularization ([place]) No sign of active disease 20 months off antivegF     ICD-10-CM   1.  Exudative age-related macular degeneration of left eye with active choroidal neovascularization (HCC)  H35.3221 OCT, Retina - OU - Both Eyes    Color Fundus Photography Optos - OU - Both Eyes    2. Exudative age-related macular degeneration of left eye with inactive choroidal neovascularization (HCC)  H35.3222 OCT, Retina - OU - Both Eyes    Color Fundus Photography Optos - OU - Both Eyes    3. Primary open angle glaucoma of both eyes, severe stage  H40.1133     4. Exudative age-related macular degeneration of right eye with inactive choroidal neovascularization ([place])  H35.3212     5. Advanced nonexudative age-related macular degeneration of both eyes with subfoveal involvement  H35.3134     6. History of corneal transplant  Z94.7       1.  OU with progressive geographic atrophy, dry AMD into the macular and foveal region.  Progressing of parafoveal disease accounting for likely acuity changes  2.  I did discuss with the family that there are new medications that might be available within the calendar year that could slow or delayed progression of atrophy particularly in his right eye with less atrophy currently present.  3.  We will notify he and others should this develop  Ophthalmic Meds Ordered this visit:  No orders of the defined types were placed in this encounter.      Return in about 1 year (around 09/20/2022) for DILATE OU, COLOR FP, OCT.  There are no Patient Instructions on file for this visit.   Explained the diagnoses, plan, and follow up with the patient and they expressed understanding.  Patient expressed understanding of the importance of proper follow up care.   Clent Demark Reese Senk M.D. Diseases & Surgery of the Retina and Vitreous [facility] [place] 09/20/21     Abbreviations: M myopia (nearsighted); A astigmatism; H hyperopia (farsighted); P presbyopia; Mrx spectacle prescription;  CTL contact lenses; OD right eye; OS left eye; OU both eyes  XT  exotropia; ET esotropia; PEK punctate epithelial keratitis; PEE punctate epithelial erosions; DES dry eye syndrome; MGD meibomian gland dysfunction; ATs artificial tears; PFAT's preservative free artificial tears; [place] nuclear sclerotic cataract; PSC posterior subcapsular cataract; ERM epi-retinal membrane; PVD posterior vitreous detachment; RD retinal detachment; DM diabetes mellitus; DR diabetic retinopathy; NPDR non-proliferative diabetic retinopathy; PDR proliferative diabetic retinopathy; CSME clinically significant macular edema; DME diabetic macular edema; dbh dot blot hemorrhages; CWS cotton wool spot; POAG primary open angle glaucoma; C/D cup-to-disc ratio; HVF humphrey visual field; GVF goldmann visual field; OCT optical coherence tomography; IOP intraocular pressure; BRVO Branch retinal vein occlusion; CRVO central retinal vein occlusion; CRAO central retinal artery occlusion; BRAO branch retinal artery occlusion; RT retinal tear; SB scleral buckle; PPV pars plana vitrectomy; VH Vitreous hemorrhage; PRP panretinal laser photocoagulation; IVK intravitreal kenalog; VMT vitreomacular traction; MH Macular hole;  NVD neovascularization of the disc; NVE neovascularization elsewhere; AREDS age related eye disease study;  ARMD age related macular degeneration; POAG primary open angle glaucoma; EBMD epithelial/anterior basement membrane dystrophy; ACIOL anterior chamber intraocular lens; IOL intraocular lens; PCIOL posterior chamber intraocular lens; Phaco/IOL phacoemulsification with intraocular lens placement; [place] photorefractive keratectomy; LASIK laser assisted in situ keratomileusis; HTN hypertension; DM diabetes mellitus; COPD chronic obstructive pulmonary disease

## 2021-09-20 NOTE — Assessment & Plan Note (Signed)
Ongoing follow-up with Dr. Thurmond Butts

## 2021-09-20 NOTE — Assessment & Plan Note (Signed)
Largely clear graft, no signs of rejection, no cellular collections or infiltrates of the endothelium  Follow-up Dr. Susa Simmonds as scheduled

## 2021-09-20 NOTE — Assessment & Plan Note (Signed)
No sign of active disease 20 months off antivegF

## 2021-09-22 ENCOUNTER — Other Ambulatory Visit (HOSPITAL_COMMUNITY): Payer: Self-pay | Admitting: Urology

## 2021-09-22 ENCOUNTER — Other Ambulatory Visit: Payer: Self-pay | Admitting: Urology

## 2021-09-22 DIAGNOSIS — C61 Malignant neoplasm of prostate: Secondary | ICD-10-CM

## 2021-10-04 ENCOUNTER — Encounter (HOSPITAL_COMMUNITY)
Admission: RE | Admit: 2021-10-04 | Discharge: 2021-10-04 | Disposition: A | Discharge status: Home / Self Care | Payer: Medicare Other | Source: Ambulatory Visit | Attending: Urology | Admitting: Urology

## 2021-10-04 ENCOUNTER — Other Ambulatory Visit: Payer: Self-pay

## 2021-10-04 DIAGNOSIS — C61 Malignant neoplasm of prostate: Secondary | ICD-10-CM | POA: Diagnosis not present

## 2021-10-04 DIAGNOSIS — N400 Enlarged prostate without lower urinary tract symptoms: Secondary | ICD-10-CM | POA: Diagnosis not present

## 2021-10-04 DIAGNOSIS — I2510 Atherosclerotic heart disease of native coronary artery without angina pectoris: Secondary | ICD-10-CM | POA: Diagnosis not present

## 2021-10-04 DIAGNOSIS — M4316 Spondylolisthesis, lumbar region: Secondary | ICD-10-CM | POA: Diagnosis not present

## 2021-10-04 DIAGNOSIS — M5136 Other intervertebral disc degeneration, lumbar region: Secondary | ICD-10-CM | POA: Diagnosis not present

## 2021-10-04 DIAGNOSIS — K5730 Diverticulosis of large intestine without perforation or abscess without bleeding: Secondary | ICD-10-CM | POA: Diagnosis not present

## 2021-10-04 DIAGNOSIS — M25512 Pain in left shoulder: Secondary | ICD-10-CM | POA: Diagnosis not present

## 2021-10-04 DIAGNOSIS — R310 Gross hematuria: Secondary | ICD-10-CM | POA: Diagnosis not present

## 2021-10-04 DIAGNOSIS — M47816 Spondylosis without myelopathy or radiculopathy, lumbar region: Secondary | ICD-10-CM | POA: Diagnosis not present

## 2021-10-04 DIAGNOSIS — K55069 Acute infarction of intestine, part and extent unspecified: Secondary | ICD-10-CM | POA: Diagnosis not present

## 2021-10-04 DIAGNOSIS — J439 Emphysema, unspecified: Secondary | ICD-10-CM | POA: Diagnosis not present

## 2021-10-04 MED ORDER — TECHNETIUM TC 99M MEDRONATE IV KIT
20.0000 | PACK | Freq: Once | INTRAVENOUS | Status: AC | PRN
  Administered 2021-10-04: 19.5 via INTRAVENOUS

## 2021-10-10 ENCOUNTER — Encounter (INDEPENDENT_AMBULATORY_CARE_PROVIDER_SITE_OTHER): Payer: Medicare Other | Admitting: Ophthalmology

## 2021-10-13 ENCOUNTER — Inpatient Hospital Stay: Payer: Medicare Other

## 2021-10-13 ENCOUNTER — Other Ambulatory Visit: Payer: Self-pay

## 2021-10-13 ENCOUNTER — Inpatient Hospital Stay: Payer: Medicare Other | Attending: Oncology | Admitting: Oncology

## 2021-10-13 VITALS — BP 99/58 | HR 84 | Temp 98.1°F | Resp 19 | Ht 70.0 in | Wt 166.0 lb

## 2021-10-13 DIAGNOSIS — C779 Secondary and unspecified malignant neoplasm of lymph node, unspecified: Secondary | ICD-10-CM | POA: Insufficient documentation

## 2021-10-13 DIAGNOSIS — K1230 Oral mucositis (ulcerative), unspecified: Secondary | ICD-10-CM | POA: Diagnosis not present

## 2021-10-13 DIAGNOSIS — R319 Hematuria, unspecified: Secondary | ICD-10-CM | POA: Diagnosis not present

## 2021-10-13 DIAGNOSIS — I4891 Unspecified atrial fibrillation: Secondary | ICD-10-CM | POA: Insufficient documentation

## 2021-10-13 DIAGNOSIS — C180 Malignant neoplasm of cecum: Secondary | ICD-10-CM | POA: Insufficient documentation

## 2021-10-13 DIAGNOSIS — H409 Unspecified glaucoma: Secondary | ICD-10-CM | POA: Diagnosis not present

## 2021-10-13 DIAGNOSIS — Z8546 Personal history of malignant neoplasm of prostate: Secondary | ICD-10-CM | POA: Insufficient documentation

## 2021-10-13 LAB — CEA (ACCESS): CEA (CHCC): 7.37 ng/mL — ABNORMAL HIGH (ref 0.00–5.00)

## 2021-10-13 NOTE — Progress Notes (Signed)
[place] OFFICE PROGRESS NOTE   Diagnosis: Colon cancer  INTERVAL HISTORY:   Jason [name] returns as scheduled.  He feels well.  No difficulty with bowel function.  He has intermittent hematuria.  He is scheduled for a cystoscopy by Dr. Lovena Neighbours next week.   A bone scan 10/04/2021 revealed no evidence of metastatic disease.  CTs on 10/04/2021 revealed a nodular enhancing area near the right UVJ, no evidence of metastatic disease.  Mild irregularity was noted at the left bladder base.  He reports progressive vision loss.  He is followed by ophthalmology.   Jason [name] and his wife continue to live independently at friend's home.  Objective:  Vital signs in last 24 hours:  Blood pressure (!) 99/58, pulse 84, temperature 98.1 F (36.7 C), temperature source Oral, resp. rate 19, height 5' 10"  (1.778 m), weight 166 lb (75.3 kg), SpO2 100 %.   Lymphatics: No cervical, supraclavicular, axillary, or inguinal nodes Resp: Lungs clear bilaterally Cardio: Irregular GI: No hepatosplenomegaly, no mass, nontender Vascular: No leg edema  Lab Results:  Lab Results  Component Value Date   WBC 5.0 05/09/2017   HGB 13.7 05/09/2017   HCT 40.7 05/09/2017   MCV 109.4 (H) 05/09/2017   PLT 143 05/09/2017   NEUTROABS 2.8 05/09/2017    CMP  Lab Results  Component Value Date   NA 139 04/11/2017   K 3.8 04/11/2017   CL 104 11/01/2016   CO2 27 04/11/2017   GLUCOSE 97 04/11/2017   BUN 13.7 04/11/2017   CREATININE 1.0 04/11/2017   CALCIUM 9.4 04/11/2017   PROT 7.5 04/11/2017   ALBUMIN 3.9 04/11/2017   AST 22 04/11/2017   ALT 19 04/11/2017   ALKPHOS 54 04/11/2017   BILITOT 0.91 04/11/2017   GFRNONAA >60 11/01/2016   GFRAA >60 11/01/2016    Lab Results  Component Value Date   CEA1 5.82 (H) 03/10/2021   CEA 7.37 (H) 10/13/2021      Imaging: As per HPI   Medictions: I have reviewed the patient's current medications.   Assessment/Plan: Colon cancer, cecum, stage  IIIB (T4a,N1b), status post a right colectomy 10/24/2016 3/23 lymph nodes positive for metastatic adenocarcinoma MSI-stable, no loss of mismatch repair protein expression Cycle 1 adjuvant Xeloda 11/20/2016 Cycle 2 adjuvant Xeloda 12/12/2016 (dose reduced due to mucositis following cycle 1) Cycle 3 adjuvant Xeloda 01/02/2017 Cycle 4 adjuvant Xeloda 01/23/2017 Cycle 5 adjuvant Xeloda 02/13/2017 Cycle 6 adjuvant Xeloda 03/06/2017 Cycle 7 adjuvant Xeloda 03/27/2017  Cycle 8 adjuvant Xeloda 04/17/2017 Colonoscopy on 03/11/2018-3 mm polyp in the descending colon (tubular adenoma, no high-grade dysplasia) CT abdomen/pelvis 10/17/2019-no evidence of metastatic disease CTs 10/04/2021-no evidence of metastatic disease, nodular enhancing area at the right UVJ, irregularity at the left bladder base   2.   History of iron deficiency anemia secondary to #1, status post IV iron therapy and multiple red cell transfusions; hemoglobin improved 11/16/2016; hemoglobin normal range 01/17/2017   3.   Prostate cancer followed by urology with observation Bone scan 04/24/2018- no change from comparison exam 10/19/2017.  No evidence of skeletal metastasis. Bone scan 10/08/2019-questionable new focus of uptake and a posterior left rib, otherwise stable Bone scan 08/11/2020-stable degenerative changes, no evidence of metastatic disease Bone scan 10/04/2021-no evidence of metastatic disease   4.  History of coronary artery disease, status post coronary artery bypass surgery 2011   5.  Aortic valve replacement 2011   6.  Atrial fibrillation-previously maintained on Coumadin; switched to Eliquis due to known  interaction between Coumadin and Xeloda   7.  Glaucoma   8.  Mucositis secondary to Xeloda following cycle 1; Xeloda dose reduced beginning with cycle 2.  9.  Hematuria-bladder nodularity noted on CT 10/04/2021-scheduled for cystoscopy with Dr. Lovena Neighbours      Disposition: Jason [name] remains in clinical remission  from colon cancer.  He is now 5 years out from diagnosis.  There is chronic mild elevation of the CEA.  The CEA elevation is likely a benign variant, or potentially related to a bladder lesion.  CTs this month revealed no evidence of metastatic colon cancer.  Jason [name] plans to continue clinical follow-up with Dr. Joylene Draft and Dr. Lovena Neighbours.  He was discharged from the medical oncology clinic today.  I am available to see him in the future as needed.  I asked him to contact me if Dr. Lovena Neighbours has any concern regarding cancer following the cystoscopy procedure.  Betsy Coder, MD  10/13/2021  10:58 AM

## 2021-10-17 DIAGNOSIS — R310 Gross hematuria: Secondary | ICD-10-CM | POA: Diagnosis not present

## 2021-10-17 DIAGNOSIS — D494 Neoplasm of unspecified behavior of bladder: Secondary | ICD-10-CM | POA: Diagnosis not present

## 2021-10-17 DIAGNOSIS — R972 Elevated prostate specific antigen [PSA]: Secondary | ICD-10-CM | POA: Diagnosis not present

## 2021-10-24 DIAGNOSIS — H401113 Primary open-angle glaucoma, right eye, severe stage: Secondary | ICD-10-CM | POA: Diagnosis not present

## 2021-10-24 DIAGNOSIS — H16401 Unspecified corneal neovascularization, right eye: Secondary | ICD-10-CM | POA: Diagnosis not present

## 2021-10-24 DIAGNOSIS — Z961 Presence of intraocular lens: Secondary | ICD-10-CM | POA: Diagnosis not present

## 2021-10-24 DIAGNOSIS — H401123 Primary open-angle glaucoma, left eye, severe stage: Secondary | ICD-10-CM | POA: Diagnosis not present

## 2021-10-24 DIAGNOSIS — Z947 Corneal transplant status: Secondary | ICD-10-CM | POA: Diagnosis not present

## 2021-10-31 DIAGNOSIS — C679 Malignant neoplasm of bladder, unspecified: Secondary | ICD-10-CM | POA: Diagnosis not present

## 2021-10-31 DIAGNOSIS — I480 Paroxysmal atrial fibrillation: Secondary | ICD-10-CM | POA: Diagnosis not present

## 2021-10-31 DIAGNOSIS — R197 Diarrhea, unspecified: Secondary | ICD-10-CM | POA: Diagnosis not present

## 2021-11-03 NOTE — Progress Notes (Unsigned)
d Cardiology Office Note:    Date:  11/03/2021   ID:  Jason [name], DOB 26-May-1926, MRN [id number]  PCP:  Jason Infante, MD   [place] Prov[facility] Cardiologist:  Jason Blume Martinique, MD Electrophysiologist:  Jason Haw, MD     Referring MD: Jason Infante, MD   No chief complaint on file.   History of Present Illness:    Jason [name] is a 88 y.o. male with a hx of CAD s/p CABG with AVR in October 2011, history of prostate cancer, hyperlipidemia, OSA on CPAP and chronic atrial fibrillation.  When he had AVR with tissue valve prosthesis, he underwent LIMA to LAD.  At the time, RCA had a 40 to 50% stenosis that was treated medically.  He has been anticoagulated with Xarelto. He was admitted in January 2016 with GI bleed.  His Xarelto was held.  He did not require blood transfusion.  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  Afterward, he was placed on Coumadin.  He had recurrent anemia in May 2017 and required blood transfusion.  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  Afterward, he returned with complaint of GI bleed.  He was evaluated by Dr. Curt [name] in March 2018, beta-blocker was increased.  There was also some wide-complex tachycardia consistent with aberrancy versus VT.  He was transition to Eliquis.  He was last seen in September 2022 by Jason Deforest PA-C and doing well.     I last saw the patient via virtual visit in January 2021 at which time he was residing in [place] with his wife.  Patient presents today along with his wife, they are still living in friends home [place].  He denies any recent exertional chest pain or worsening shortness of breath.  He is exercising on a daily basis in the gym.  Most recent blood work obtained by PCP in May 2022 continue to show low HDL, however very well controlled total cholesterol, LDL and triglyceride.  Heart rate is very well controlled on the current medication.  He has not taking 25 mg  twice a day of metoprolol, instead he is taking 12.5 mg twice a day dosing.  Otherwise, he is doing well from the cardiac perspective and can follow-up with Dr. Martinique in 6 months.   Past Medical History:  Diagnosis Date   Abnormal PSA    Adenomatous polyp of colon 2010 and before 2006   Anemia    Aortic stenosis    Aortic valve prosthesis present    Atrial fibrillation (HCC)    CAD (coronary artery disease)    DDD (degenerative disc disease), lumbar    Depression    Diverticulosis    Dyslipidemia    GERD (gastroesophageal reflux disease)    Glaucoma    History of blood transfusion 09/29/2016   Hx of CABG    Hypogonadism, male    Macular degeneration    OSA (obstructive sleep apnea)    Osteoarthritis    Osteopenia    Prostate CA ([place]) prostate 2007   colon dx 2018, watching psa levels, no tx yet for prostate    Past Surgical History:  Procedure Laterality Date   AORTIC VALVE REPLACEMENT  October 2011   Magna Ease pericardial tissue valve #2m   BACK SURGERY  1978   lower   CATARACT EXTRACTION Bilateral    CORNEAL TRANSPLANT Right    CORONARY ARTERY BYPASS GRAFT  October 2011   LIMA to LAD  ESOPHAGOGASTRODUODENOSCOPY N/A 09/16/2014   Procedure: ESOPHAGOGASTRODUODENOSCOPY (EGD);  Surgeon: Jason Shipper, MD;  Location: [facility] ENDOSCOPY;  Service: Endoscopy;  Laterality: N/A;   TONSILLECTOMY  76 yrs ago    Current Medications: No outpatient medications have been marked as taking for the 11/08/21 encounter (Appointment) with [name], Jason Bragdon M, MD.     Allergies:   Xarelto [rivaroxaban]   Social History   Socioeconomic History   Marital status: Married    Spouse name: Jason [name]   Number of children: 2   Years of education: Not on file   Highest education level: Not on file  Occupational History   Not on file  Tobacco Use   Smoking status: Former    Packs/day: 1.00    Years: 40.00    Pack years: 40.00    Types: Cigarettes    Quit date: 11/16/1990    Years since  quitting: 30.9   Smokeless tobacco: Never  Vaping Use   Vaping Use: Never used  Substance and Sexual Activity   Alcohol use: Yes    Alcohol/week: 2.0 standard drinks    Types: 2 Glasses of wine per week    Comment: wine few x per week   Drug use: No   Sexual activity: Not on file  Other Topics Concern   Not on file  Social History Narrative   Not on file   Social Determinants of Health   Financial Resource Strain: Not on file  Food Insecurity: Not on file  Transportation Needs: Not on file  Physical Activity: Not on file  Stress: Not on file  Social Connections: Not on file     Family History: The patient's family history includes Heart attack in his father. There is no history of Colon cancer, Esophageal cancer, Stomach cancer, or Rectal cancer.  ROS:   Please see the history of present illness.     All other systems reviewed and are negative.  EKGs/Labs/Other Studies Reviewed:    The following studies were reviewed today:  Echo 07/17/2014 LV EF: 55% -   60%  Study Conclusions   - Left ventricle: The cavity size was normal. Wall thickness was    increased in a pattern of mild LVH. There was mild concentric    hypertrophy. Systolic function was normal. The estimated ejection    fraction was in the range of 55% to 60%. Wall motion was normal;    there were no regional wall motion abnormalities.  - Ventricular septum: Septal motion showed paradox.  - Aortic valve: A bioprosthesis was present and functioning    normally. Valve area (VTI): 1.6 cm^2. Valve area (Vmax): 1.5    cm^2. Valve area (Vmean): 1.33 cm^2.  - Mitral valve: Calcified, moderately to severely fibrotic annulus.    Mildly thickened leaflets . The findings are consistent with    trivial stenosis. There was mild regurgitation directed    centrally. Valve area by continuity equation (using LVOT flow):    2.6 cm^2.  - Left atrium: The atrium was moderately dilated.  - Right atrium: The atrium was  mildly dilated.   EKG:  EKG is ordered today.  The ekg ordered today demonstrates atrial fibrillation, heart rate 69, poor R wave progression in the anterior leads.  Recent Labs: No results found for requested labs within last 8760 hours.  Recent Lipid Panel    Component Value Date/Time   CHOL 89 01/03/2012 0835   TRIG 125.0 01/03/2012 0835   HDL 37.40 (L) 01/03/2012 0835   CHOLHDL  2 01/03/2012 0835   VLDL 25.0 01/03/2012 0835   LDLCALC 27 01/03/2012 0835   Dated 01/20/21: cholesterol 110, triglycerides 148, HDL 37, LDL 43. CMET normal Dated 08/08/21: normal CBC.  Risk Assessment/Calculations:    CHA2DS2-VASc Score = 3   This indicates a 3.2% annual risk of stroke. The patient's score is based upon: CHF History: 0 HTN History: 0 Diabetes History: 0 Stroke History: 0 Vascular Disease History: 1 Age Score: 2 Gender Score: 0    {This patient has a significant risk of stroke if diagnosed with atrial fibrillation.  Please consider VKA or DOAC agent for anticoagulation if the bleeding risk is acceptable.   You can also use the SmartPhrase .Leota for documentation.   :[id number]}       Physical Exam:    VS:  There were no vitals taken for this visit.    Wt Readings from Last 3 Encounters:  10/13/21 166 lb (75.3 kg)  05/10/21 165 lb 3.2 oz (74.9 kg)  07/13/20 173 lb 14.4 oz (78.9 kg)     GEN:  Well nourished, well developed in no acute distress HEENT: Normal NECK: No JVD; No carotid bruits LYMPHATICS: No lymphadenopathy CARDIAC: Irregularly irregular, no murmurs, rubs, gallops RESPIRATORY:  Clear to auscultation without rales, wheezing or rhonchi  ABDOMEN: Soft, non-tender, non-distended MUSCULOSKELETAL:  No edema; No deformity  SKIN: Warm and dry NEUROLOGIC:  Alert and oriented x 3 PSYCHIATRIC:  Normal affect   ASSESSMENT:    No diagnosis found.  PLAN:    In order of problems listed above:  Permanent atrial fibrillation: Continue Eliquis and  metoprolol.  He is not on 25 mg twice a day of metoprolol, instead he is taking 12.5 mg twice a day.  Heart rate seems to be very well controlled.  He  CAD s/p CABG: Denies any recent chest pain.  Not on aspirin given the need for Eliquis.  History of AVR: No significant heart murmur on physical exam  Hyperlipidemia: Continue Crestor.         Medication Adjustments/Labs and Tests Ordered: Current medicines are reviewed at length with the patient today.  Concerns regarding medicines are outlined above.  No orders of the defined types were placed in this encounter.  No orders of the defined types were placed in this encounter.    There are no Patient Instructions on file for this visit.   Signed, Basia Mcginty Martinique, MD  11/03/2021 7:44 AM    [facility]

## 2021-11-08 ENCOUNTER — Ambulatory Visit (INDEPENDENT_AMBULATORY_CARE_PROVIDER_SITE_OTHER): Payer: Medicare Other | Admitting: Cardiology

## 2021-11-08 ENCOUNTER — Encounter: Payer: Self-pay | Admitting: Cardiology

## 2021-11-08 ENCOUNTER — Other Ambulatory Visit: Payer: Self-pay

## 2021-11-08 VITALS — BP 110/70 | HR 58 | Ht 70.0 in | Wt 164.4 lb

## 2021-11-08 DIAGNOSIS — E785 Hyperlipidemia, unspecified: Secondary | ICD-10-CM

## 2021-11-08 DIAGNOSIS — Z952 Presence of prosthetic heart valve: Secondary | ICD-10-CM

## 2021-11-08 DIAGNOSIS — I4821 Permanent atrial fibrillation: Secondary | ICD-10-CM

## 2021-11-08 DIAGNOSIS — I25810 Atherosclerosis of coronary artery bypass graft(s) without angina pectoris: Secondary | ICD-10-CM

## 2021-11-13 DIAGNOSIS — Z20828 Contact with and (suspected) exposure to other viral communicable diseases: Secondary | ICD-10-CM | POA: Diagnosis not present

## 2021-11-17 DIAGNOSIS — H401123 Primary open-angle glaucoma, left eye, severe stage: Secondary | ICD-10-CM | POA: Diagnosis not present

## 2021-11-17 DIAGNOSIS — H401113 Primary open-angle glaucoma, right eye, severe stage: Secondary | ICD-10-CM | POA: Diagnosis not present

## 2021-11-21 DIAGNOSIS — D494 Neoplasm of unspecified behavior of bladder: Secondary | ICD-10-CM | POA: Diagnosis not present

## 2021-11-21 DIAGNOSIS — R8279 Other abnormal findings on microbiological examination of urine: Secondary | ICD-10-CM | POA: Diagnosis not present

## 2021-11-22 ENCOUNTER — Telehealth: Payer: Self-pay | Admitting: Cardiology

## 2021-11-22 NOTE — Telephone Encounter (Signed)
? ?  Patient Name: Jason [name]  ?DOB: March 06, 1926 ?MRN: [id number] ? ?Primary Cardiologist: Peter Martinique, MD ? ?Chart reviewed as part of pre-operative protocol coverage. Patient recently saw Dr. Martinique 11/08/21 who did acknowledge the consideration of bladder surgery. Per his note, "Patient is cleared for planned cystoscopic procedure from my standpoint. May hold Eliquis for 3 days prior and resume when bleeding controlled. Overall procedure is low risk and CV risk is low based on lack of symptoms and current functional status." There have been no pertinent clinical changes documented since that time, therefore the clearance can still be applied to the requested surgery below. ? ?Will route this bundled recommendation and Dr. Doug Sou recent note to requesting provider via Epic fax function. Please call with questions. ? ?Charlie Pitter, PA-C ?11/22/2021, 1:30 PM ? ? ?

## 2021-11-22 NOTE — Telephone Encounter (Signed)
? ?  Pre-operative Risk Assessment  ?  ?Patient Name: Jason [name]  ?DOB: 09/09/1925 ?MRN: [id number]  ? ?  ? ?Request for Surgical Clearance   ? ?Procedure:  Transurethral resection of bladder tumor  ?Date of Surgery:  Clearance TBD                              ?   ?Surgeon:  Dr. Ellison Hughs ?Surgeon's Group or Practice Name:  [facility] ?Phone number:  [phone] ?Fax number:  [phone] ?  ?Type of Clearance Requested:   ?- Medical  ?- Pharmacy:  Hold Apixaban (Eliquis) 3 days  ?  ?Type of Anesthesia:  General  ?  ?Additional requests/questions:   ? ?Signed, ?Johnna Acosta   ?11/22/2021, 10:21 AM   ?

## 2021-11-25 DIAGNOSIS — M5136 Other intervertebral disc degeneration, lumbar region: Secondary | ICD-10-CM | POA: Diagnosis not present

## 2021-11-25 DIAGNOSIS — E785 Hyperlipidemia, unspecified: Secondary | ICD-10-CM | POA: Diagnosis not present

## 2021-11-25 DIAGNOSIS — G4733 Obstructive sleep apnea (adult) (pediatric): Secondary | ICD-10-CM | POA: Diagnosis not present

## 2021-12-12 ENCOUNTER — Other Ambulatory Visit: Payer: Self-pay | Admitting: Urology

## 2021-12-23 DIAGNOSIS — Z20822 Contact with and (suspected) exposure to covid-19: Secondary | ICD-10-CM | POA: Diagnosis not present

## 2021-12-25 DIAGNOSIS — M5136 Other intervertebral disc degeneration, lumbar region: Secondary | ICD-10-CM | POA: Diagnosis not present

## 2021-12-25 DIAGNOSIS — E785 Hyperlipidemia, unspecified: Secondary | ICD-10-CM | POA: Diagnosis not present

## 2021-12-25 DIAGNOSIS — G4733 Obstructive sleep apnea (adult) (pediatric): Secondary | ICD-10-CM | POA: Diagnosis not present

## 2021-12-26 DIAGNOSIS — D494 Neoplasm of unspecified behavior of bladder: Secondary | ICD-10-CM | POA: Diagnosis not present

## 2021-12-26 DIAGNOSIS — Z01818 Encounter for other preprocedural examination: Secondary | ICD-10-CM | POA: Diagnosis not present

## 2021-12-26 DIAGNOSIS — N390 Urinary tract infection, site not specified: Secondary | ICD-10-CM | POA: Diagnosis not present

## 2021-12-27 NOTE — Progress Notes (Addendum)
COVID Vaccine Completed: Yes x2  ?Date COVID Vaccine completed: 09-01-19 09-29-19 ?Has received booster:  Yes x3 07-06-20 ?COVID vaccine manufacturer:    Moderna    ? ?Date of COVID positive in last 90 days: ? ?PCP - [name], MD ?Cardiologist - Peter Martinique, MD ? ?Cardiac clearance in Epic dated 11-22-21 by Melina Copa, PA-C ? ?Chest x-ray - N/A ?EKG - 05-10-21 Epic ?Stress Test - N/A ?ECHO - greater than 2 years Epic ?Cardiac Cath - N/A ?Pacemaker/ICD device last checked: ?Spinal Cord Stimulator: ? ?Bowel Prep - N/A ? ?Sleep Study - Yes, +sleep apnea ?CPAP - Yes ? ?Fasting Blood Sugar - N/A ?Checks Blood Sugar _____ times a day ? ?Blood Thinner Instructions: Eliquis.  Hold x3 days per clearance.  Patient is aware  ?Aspirin Instructions: ?Last Dose: ? ?Activity level:   Can go up a flight of stairs and perform activities of daily living without stopping and without symptoms of chest pain or shortness of breath.  Able to exercise without symptoms ? ?Anesthesia review:  CAD, aortic valve disorder, aortic stenosis, Afib.  Hx of CABG.  OSA ? ?Patient denies shortness of breath, fever, cough and chest pain at PAT appointment ? ?Patient verbalized understanding of instructions that were given to them at the PAT appointment. Patient was also instructed that they will need to review over the PAT instructions again at home before surgery.  ?

## 2021-12-27 NOTE — Patient Instructions (Addendum)
DUE TO COVID-19 ONLY TWO VISITORS  (aged 86 and older)  IS ALLOWED TO COME WITH YOU AND STAY IN THE WAITING ROOM ONLY DURING PRE OP AND PROCEDURE.   ?**NO VISITORS ARE ALLOWED IN THE SHORT STAY AREA OR RECOVERY ROOM!!** ? ?You are not required to quarantine ?Hand Hygiene often ?Do NOT share personal items ?Notify your provider if you are in close contact with someone who has COVID or you develop fever 100.4 or greater, new onset of sneezing, cough, sore throat, shortness of breath or body aches. ? ?     ? Your procedure is scheduled on:  01-04-22 ? ? Report to [facility] Main Entrance ? ?  Report to admitting at 8:45 AM ? ? Call this number if you have problems the morning of surgery [phone] ? ? Do not eat food :After Midnight. ? ? After Midnight you may have the following liquids until 8:00 AM DAY OF SURGERY ? ?Water ?Black Coffee (sugar ok, NO MILK/CREAM OR CREAMERS)  ?Tea (sugar ok, NO MILK/CREAM OR CREAMERS) regular and decaf                             ?Plain Jell-O (NO RED)                                           ?Fruit ices (not with fruit pulp, NO RED)                                     ?Popsicles (NO RED)                                                                  ?Juice: apple, WHITE grape, WHITE cranberry ?Sports drinks like Gatorade (NO RED) ?Clear broth(vegetable,chicken,beef) ? ?             ? FOLLOW  ANY ADDITIONAL PRE OP INSTRUCTIONS YOU RECEIVED FROM YOUR SURGEON'S OFFICE!!! ?  ?  ?Oral Hygiene is also important to reduce your risk of infection.                                    ?Remember - BRUSH YOUR TEETH THE MORNING OF SURGERY WITH YOUR REGULAR TOOTHPASTE ? ? Do NOT smoke after Midnight ? ?Take these medicines the morning of surgery with A SIP OF WATER: Gabapentin, Metoprolol, Pantoprazole, Perphenazine, Rosuvastatin.  Okay to use eyedrops and Tylenol ? ?Bring CPAP mask and tubing day of surgery. ?                  ?           You may not have any metal on your body  including  jewelry, and body piercing ? ?           Do not wear, lotions, powders, cologne, or deodorant ? ?            Men may shave face and neck. ? ?  Do not bring valuables to the hospital. [place]. ? ? Contacts, dentures or bridgework may not be worn into surgery. ? ?Patients discharged on the day of surgery will not be allowed to drive home.  Someone NEEDS to stay with you for the first 24 hours after anesthesia. ? ?Please read over the following fact sheets you were given: IF [place]  ? ?[facility] - Preparing for Surgery ?Before surgery, you can play an important role.  Because skin is not sterile, your skin needs to be as free of germs as possible.  You can reduce the number of germs on your skin by washing with CHG (chlorahexidine gluconate) soap before surgery.  CHG is an antiseptic cleaner which kills germs and bonds with the skin to continue killing germs even after washing. ?Please DO NOT use if you have an allergy to CHG or antibacterial soaps.  If your skin becomes reddened/irritated stop using the CHG and inform your nurse when you arrive at Short Stay. ?Do not shave (including legs and underarms) for at least 48 hours prior to the first CHG shower.  You may shave your face/neck. ? ?Please follow these instructions carefully: ? 1.  Shower with CHG Soap the night before surgery and the  morning of surgery. ? 2.  If you choose to wash your hair, wash your hair first as usual with your normal  shampoo. ? 3.  After you shampoo, rinse your hair and body thoroughly to remove the shampoo.                            ? 4.  Use CHG as you would any other liquid soap.  You can apply chg directly to the skin and wash.  Gently with a scrungie or clean washcloth. ? 5.  Apply the CHG Soap to your body ONLY FROM THE NECK DOWN.   Do   not use on face/ open      ?                     Wound or open sores.  Avoid contact with eyes, ears mouth and   genitals (private parts).  ?                     Production manager,  Genitals (private parts) with your normal soap. ?            6.  Wash thoroughly, paying special attention to the area where your    surgery  will be performed. ? 7.  Thoroughly rinse your body with warm water from the neck down. ? 8.  DO NOT shower/wash with your normal soap after using and rinsing off the CHG Soap. ?               9.  Pat yourself dry with a clean towel. ?           10.  Wear clean pajamas. ?           11.  Place clean sheets on your bed the night of your first shower and do not  sleep with pets. ?Day of Surgery : ?Do not apply any lotions/deodorants the morning of surgery.  Please wear clean clothes to the hospital/surgery center. ? ?FAILURE TO FOLLOW THESE INSTRUCTIONS MAY RESULT IN THE CANCELLATION OF  YOUR SURGERY ? ?PATIENT SIGNATURE_________________________________ ? ?NURSE SIGNATURE__________________________________ ? ?________________________________________________________________________  ?  ?

## 2021-12-28 ENCOUNTER — Encounter (HOSPITAL_COMMUNITY)
Admission: RE | Admit: 2021-12-28 | Discharge: 2021-12-28 | Disposition: A | Discharge status: Home / Self Care | Payer: Medicare Other | Source: Ambulatory Visit | Attending: Urology | Admitting: Urology

## 2021-12-28 ENCOUNTER — Encounter (HOSPITAL_COMMUNITY): Payer: Self-pay

## 2021-12-28 ENCOUNTER — Other Ambulatory Visit: Payer: Self-pay

## 2021-12-28 VITALS — BP 118/78 | HR 70 | Temp 98.3°F | Resp 16 | Ht 70.0 in | Wt 163.0 lb

## 2021-12-28 DIAGNOSIS — Z01812 Encounter for preprocedural laboratory examination: Secondary | ICD-10-CM | POA: Diagnosis not present

## 2021-12-28 DIAGNOSIS — I2510 Atherosclerotic heart disease of native coronary artery without angina pectoris: Secondary | ICD-10-CM | POA: Diagnosis not present

## 2021-12-28 DIAGNOSIS — C7982 Secondary malignant neoplasm of genital organs: Secondary | ICD-10-CM | POA: Diagnosis not present

## 2021-12-28 DIAGNOSIS — Z87891 Personal history of nicotine dependence: Secondary | ICD-10-CM | POA: Insufficient documentation

## 2021-12-28 DIAGNOSIS — D494 Neoplasm of unspecified behavior of bladder: Secondary | ICD-10-CM | POA: Diagnosis not present

## 2021-12-28 DIAGNOSIS — Z951 Presence of aortocoronary bypass graft: Secondary | ICD-10-CM | POA: Diagnosis not present

## 2021-12-28 DIAGNOSIS — K219 Gastro-esophageal reflux disease without esophagitis: Secondary | ICD-10-CM | POA: Diagnosis not present

## 2021-12-28 DIAGNOSIS — G4733 Obstructive sleep apnea (adult) (pediatric): Secondary | ICD-10-CM | POA: Diagnosis not present

## 2021-12-28 DIAGNOSIS — I4891 Unspecified atrial fibrillation: Secondary | ICD-10-CM | POA: Insufficient documentation

## 2021-12-28 DIAGNOSIS — D649 Anemia, unspecified: Secondary | ICD-10-CM | POA: Insufficient documentation

## 2021-12-28 DIAGNOSIS — Z7901 Long term (current) use of anticoagulants: Secondary | ICD-10-CM | POA: Insufficient documentation

## 2021-12-28 HISTORY — DX: Anxiety disorder, unspecified: F41.9

## 2021-12-28 HISTORY — DX: Personal history of urinary calculi: Z87.442

## 2021-12-28 LAB — BASIC METABOLIC PANEL
Anion gap: 5 (ref 5–15)
BUN: 14 mg/dL (ref 8–23)
CO2: 30 mmol/L (ref 22–32)
Calcium: 8.9 mg/dL (ref 8.9–10.3)
Chloride: 103 mmol/L (ref 98–111)
Creatinine, Ser: 0.98 mg/dL (ref 0.61–1.24)
GFR, Estimated: >60 mL/min (ref >60)
Glucose, Bld: 121 mg/dL — ABNORMAL HIGH (ref 70–99)
Potassium: 3.8 mmol/L (ref 3.5–5.1)
Sodium: 138 mmol/L (ref 135–145)

## 2021-12-28 LAB — CBC
HCT: 41.1 % (ref 39.0–52.0)
Hemoglobin: 13.4 g/dL (ref 13.0–17.0)
MCH: 31.8 pg (ref 26.0–34.0)
MCHC: 32.6 g/dL (ref 30.0–36.0)
MCV: 97.6 fL (ref 80.0–100.0)
Platelets: 164 10*3/uL (ref 150–400)
RBC: 4.21 MIL/uL — ABNORMAL LOW (ref 4.22–5.81)
RDW: 13.2 % (ref 11.5–15.5)
WBC: 5.9 10*3/uL (ref 4.0–10.5)
nRBC: 0 % (ref 0.0–0.2)

## 2021-12-29 NOTE — Anesthesia Preprocedure Evaluation (Addendum)
Anesthesia Evaluation  ? ? ?Airway ?Mallampati: II ? ?TM Distance: >3 FB ?Neck ROM: Full ? ? ? Dental ?no notable dental hx. ? ?  ?Pulmonary ?sleep apnea , former smoker,  ?  ?Pulmonary exam normal ? ? ? ? ? ? ? Cardiovascular ?+ CAD and + CABG  ?+ dysrhythmias (on Eliquis) Atrial Fibrillation + Valvular Problems/Murmurs (s/p AVR 2011)  ?Rhythm:Irregular Rate:Normal ? ? ?  ?Neuro/Psych ?Anxiety Depression negative neurological ROS ?   ? GI/Hepatic ?Neg liver ROS, GERD  Medicated,  ?Endo/Other  ?negative endocrine ROS ? Renal/GU ?negative Renal ROS Bladder dysfunction ? ?Bladder tumor ? ?  ?Musculoskeletal ? ?(+) Arthritis , Osteoarthritis,   ? Abdominal ?Normal abdominal exam  (+)   ?Peds ? Hematology ? ?(+) Blood dyscrasia, anemia ,   ?Anesthesia Other Findings ? ? Reproductive/Obstetrics ? ?  ? ? ? ? ? ? ? ? ? ? ? ? ? ?  ?  ? ? ? ? ? ? ? ?Anesthesia Physical ?Anesthesia Plan ? ?ASA: 3 ? ?Anesthesia Plan: General  ? ?Post-op Pain Management:   ? ?Induction: Intravenous ? ?PONV Risk Score and Plan: 2 and Ondansetron, Dexamethasone and Treatment may vary due to age or medical condition ? ?Airway Management Planned: Mask and LMA ? ?Additional Equipment: None ? ?Intra-op Plan:  ? ?Post-operative Plan: Extubation in OR ? ?Informed Consent: I have reviewed the patients History and Physical, chart, labs and discussed the procedure including the risks, benefits and alternatives for the proposed anesthesia with the patient or authorized representative who has indicated his/her understanding and acceptance.  ? ? ? ?Dental advisory given ? ?Plan Discussed with: CRNA ? ?Anesthesia Plan Comments: (See PAT note 12/28/2021 ?Lab Results ?     Component                Value               Date                 ?     WBC                      5.9                 12/28/2021           ?     HGB                      13.4                12/28/2021           ?     HCT                      41.1                 12/28/2021           ?     MCV                      97.6                12/28/2021           ?     PLT                      164  12/28/2021           ?Lab Results ?     Component                Value               Date                 ?     NA                       138                 12/28/2021           ?     K                        3.8                 12/28/2021           ?     CO2                      30                  12/28/2021           ?     GLUCOSE                  121 (H)             12/28/2021           ?     BUN                      14                  12/28/2021           ?     CREATININE               0.98                12/28/2021           ?     CALCIUM                  8.9                 12/28/2021           ?     EGFR                     66 (L)              04/11/2017           ?     GFRNONAA                 >60                 12/28/2021          )  ? ? ? ? ? ?Anesthesia Quick Evaluation ? ?

## 2021-12-29 NOTE — Progress Notes (Signed)
Anesthesia Chart Review ? ? Case: [id number] Date/Time: 01/04/22 1045  ? Procedure: TRANSURETHRAL RESECTION OF BLADDER TUMOR (TURBT) WITH POST OPERATIVE INSTILLATION OF GEMCITABINE/ POSSIBLE RIGHT STENT PLACEMENT (Right)  ? Anesthesia type: General  ? Pre-op diagnosis: BLADDER TUMOR  ? Location: [facility] / [facility]  ? Surgeons: Ceasar Mons, MD  ? ?  ? ? ?DISCUSSION:88 y.o. former smoker with h/o GERD, OSA, CAD s/p CABG with AVR 05/2010, atrial fibrillation (on Eliquis), AVR, psotate cancer, bladder tumor scheduled for above procedure 01/04/2022 with Dr. Harrell Gave Lovena Neighbours.  ? ?Per cardiology preoperative evaluation 11/22/21, "Chart reviewed as part of pre-operative protocol coverage. Patient recently saw Dr. Martinique 11/08/21 who did acknowledge the consideration of bladder surgery. Per his note, "Patient is cleared for planned cystoscopic procedure from my standpoint. May hold Eliquis for 3 days prior and resume when bleeding controlled. Overall procedure is low risk and CV risk is low based on lack of symptoms and current functional status." There have been no pertinent clinical changes documented since that time, therefore the clearance can still be applied to the requested surgery below." ? ?Anticipate pt can proceed with planned procedure barring acute status change.   ?VS: BP 118/78   Pulse 70   Temp 36.8 ?C (Oral)   Resp 16   Ht '5\' 10"'$  (1.778 m)   Wt 73.9 kg   SpO2 98%   BMI 23.39 kg/m?  ? ?PROVIDERS: ?[name], MD is PCP  ? ?Cardiologist - Peter Martinique, MD ? ?LABS: Labs reviewed: Acceptable for surgery. ?(all labs ordered are listed, but only abnormal results are displayed) ? ?Labs Reviewed  ?CBC - Abnormal; Notable for the following components:  ?    Result Value  ? RBC 4.21 (*)   ? All other components within normal limits  ?BASIC METABOLIC PANEL - Abnormal; Notable for the following components:  ? Glucose, Bld 121 (*)   ? All other components within normal limits   ? ? ? ?IMAGES: ? ? ?EKG: ?05/10/2021 ?Rate 69 bpm  ?Atrial fibrillation  ?LAD  ?Septal infarct, age undetermined ? ?CV: ?Echo 06/16/2014 ?- Left ventricle: The cavity size was normal. Wall thickness was  ?  increased in a pattern of mild LVH. There was mild concentric  ?  hypertrophy. Systolic function was normal. The estimated ejection  ?  fraction was in the range of 55% to 60%. Wall motion was normal;  ?  there were no regional wall motion abnormalities.  ?- Ventricular septum: Septal motion showed paradox.  ?- Aortic valve: A bioprosthesis was present and functioning  ?  normally. Valve area (VTI): 1.6 cm^2. Valve area (Vmax): 1.5  ?  cm^2. Valve area (Vmean): 1.33 cm^2.  ?- Mitral valve: Calcified, moderately to severely fibrotic annulus.  ?  Mildly thickened leaflets . The findings are consistent with  ?  trivial stenosis. There was mild regurgitation directed  ?  centrally. Valve area by continuity equation (using LVOT flow):  ?  2.6 cm^2.  ?- Left atrium: The atrium was moderately dilated.  ?- Right atrium: The atrium was mildly dilated.  ?Past Medical History:  ?Diagnosis Date  ? Abnormal PSA   ? Adenomatous polyp of colon 2010 and before 2006  ? Anemia   ? Anxiety   ? Aortic stenosis   ? Aortic valve prosthesis present   ? Atrial fibrillation ([place])   ? CAD (coronary artery disease)   ? DDD (degenerative disc disease), lumbar   ? Depression   ?  Diverticulosis   ? Dyslipidemia   ? GERD (gastroesophageal reflux disease)   ? Glaucoma   ? History of blood transfusion 09/29/2016  ? History of kidney stones   ? Hx of CABG   ? Hypogonadism, male   ? Macular degeneration   ? OSA (obstructive sleep apnea)   ? Osteoarthritis   ? Osteopenia   ? Prostate CA [facility]) prostate 2007  ? colon dx 2018, watching psa levels, no tx yet for prostate  ? ? ?Past Surgical History:  ?Procedure Laterality Date  ? AORTIC VALVE REPLACEMENT  October 2011  ? Magna Ease pericardial tissue valve #59m  ? [place] ? lower  ?  CATARACT EXTRACTION Bilateral   ? CORNEAL TRANSPLANT Right   ? CORONARY ARTERY BYPASS GRAFT  October 2011  ? [place] to LAD  ? ESOPHAGOGASTRODUODENOSCOPY N/A 09/16/2014  ? Procedure: ESOPHAGOGASTRODUODENOSCOPY (EGD);  Surgeon: JIrene Shipper MD;  [facility];  Service: Endoscopy;  Laterality: N/A;  ? TONSILLECTOMY  74 yrs ago  ? ? ?MEDICATIONS: ? acetaminophen (TYLENOL) 500 MG tablet  ? amitriptyline (ELAVIL) 25 MG tablet  ? amoxicillin (AMOXIL) 500 MG capsule  ? apixaban (ELIQUIS) 5 MG TABS tablet  ? brimonidine (ALPHAGAN) 0.2 % ophthalmic solution  ? Calcium Carbonate-Vitamin D (CALCIUM-VITAMIN D) 500-200 MG-UNIT per tablet  ? Cholecalciferol 1000 UNITS capsule  ? diclofenac Sodium (VOLTAREN) 1 % GEL  ? dorzolamide-timolol (COSOPT) 22.3-6.8 MG/ML ophthalmic solution  ? fluticasone (FLONASE) 50 MCG/ACT nasal spray  ? gabapentin (NEURONTIN) 300 MG capsule  ? ipratropium (ATROVENT) 0.06 % nasal spray  ? latanoprost (XALATAN) 0.005 % ophthalmic solution  ? metoprolol tartrate (LOPRESSOR) 25 MG tablet  ? Multiple Vitamin (MULTIVITAMIN WITH MINERALS) TABS tablet  ? pantoprazole (PROTONIX) 40 MG tablet  ? perphenazine (TRILAFON) 2 MG tablet  ? prednisoLONE acetate (PRED FORTE) 1 % ophthalmic suspension  ? psyllium (METAMUCIL) 58.6 % powder  ? rosuvastatin (CRESTOR) 5 MG tablet  ? vitamin B-12 (CYANOCOBALAMIN) 1000 MCG tablet  ? ?No current facility-administered medications for this encounter.  ? ? ?JKonrad FelixWard, PA-C ?[facility] ?[phone]? ? ? ? ? ? ?

## 2022-01-04 ENCOUNTER — Encounter (HOSPITAL_COMMUNITY): Payer: Self-pay | Admitting: Urology

## 2022-01-04 ENCOUNTER — Ambulatory Visit (HOSPITAL_COMMUNITY): Payer: Medicare Other

## 2022-01-04 ENCOUNTER — Other Ambulatory Visit: Payer: Self-pay

## 2022-01-04 ENCOUNTER — Ambulatory Visit (HOSPITAL_BASED_OUTPATIENT_CLINIC_OR_DEPARTMENT_OTHER): Payer: Medicare Other | Admitting: Certified Registered Nurse Anesthetist

## 2022-01-04 ENCOUNTER — Ambulatory Visit (HOSPITAL_COMMUNITY): Payer: Medicare Other | Admitting: Physician Assistant

## 2022-01-04 ENCOUNTER — Ambulatory Visit (HOSPITAL_COMMUNITY)
Admission: RE | Admit: 2022-01-04 | Discharge: 2022-01-04 | Disposition: A | Discharge status: Home / Self Care | Payer: Medicare Other | Attending: Urology | Admitting: Urology

## 2022-01-04 ENCOUNTER — Encounter (HOSPITAL_COMMUNITY)
Admission: RE | Disposition: A | Discharge status: Home / Self Care | Payer: Self-pay | Source: Home / Self Care | Attending: Urology

## 2022-01-04 DIAGNOSIS — C679 Malignant neoplasm of bladder, unspecified: Secondary | ICD-10-CM | POA: Diagnosis not present

## 2022-01-04 DIAGNOSIS — Z87891 Personal history of nicotine dependence: Secondary | ICD-10-CM | POA: Diagnosis not present

## 2022-01-04 DIAGNOSIS — I2510 Atherosclerotic heart disease of native coronary artery without angina pectoris: Secondary | ICD-10-CM | POA: Insufficient documentation

## 2022-01-04 DIAGNOSIS — Z951 Presence of aortocoronary bypass graft: Secondary | ICD-10-CM | POA: Insufficient documentation

## 2022-01-04 DIAGNOSIS — I4891 Unspecified atrial fibrillation: Secondary | ICD-10-CM

## 2022-01-04 DIAGNOSIS — D649 Anemia, unspecified: Secondary | ICD-10-CM

## 2022-01-04 DIAGNOSIS — Z9049 Acquired absence of other specified parts of digestive tract: Secondary | ICD-10-CM | POA: Diagnosis not present

## 2022-01-04 DIAGNOSIS — D494 Neoplasm of unspecified behavior of bladder: Secondary | ICD-10-CM | POA: Diagnosis not present

## 2022-01-04 DIAGNOSIS — N135 Crossing vessel and stricture of ureter without hydronephrosis: Secondary | ICD-10-CM

## 2022-01-04 DIAGNOSIS — G4730 Sleep apnea, unspecified: Secondary | ICD-10-CM | POA: Insufficient documentation

## 2022-01-04 DIAGNOSIS — F418 Other specified anxiety disorders: Secondary | ICD-10-CM | POA: Diagnosis not present

## 2022-01-04 DIAGNOSIS — C180 Malignant neoplasm of cecum: Secondary | ICD-10-CM | POA: Insufficient documentation

## 2022-01-04 DIAGNOSIS — C7982 Secondary malignant neoplasm of genital organs: Secondary | ICD-10-CM | POA: Insufficient documentation

## 2022-01-04 DIAGNOSIS — K219 Gastro-esophageal reflux disease without esophagitis: Secondary | ICD-10-CM | POA: Insufficient documentation

## 2022-01-04 DIAGNOSIS — C678 Malignant neoplasm of overlapping sites of bladder: Secondary | ICD-10-CM | POA: Diagnosis not present

## 2022-01-04 DIAGNOSIS — N3289 Other specified disorders of bladder: Secondary | ICD-10-CM | POA: Diagnosis not present

## 2022-01-04 DIAGNOSIS — D630 Anemia in neoplastic disease: Secondary | ICD-10-CM | POA: Diagnosis not present

## 2022-01-04 DIAGNOSIS — N138 Other obstructive and reflux uropathy: Secondary | ICD-10-CM | POA: Diagnosis not present

## 2022-01-04 DIAGNOSIS — Z7901 Long term (current) use of anticoagulants: Secondary | ICD-10-CM | POA: Insufficient documentation

## 2022-01-04 DIAGNOSIS — H548 Legal blindness, as defined in USA: Secondary | ICD-10-CM | POA: Insufficient documentation

## 2022-01-04 DIAGNOSIS — N329 Bladder disorder, unspecified: Secondary | ICD-10-CM | POA: Diagnosis present

## 2022-01-04 HISTORY — PX: TRANSURETHRAL RESECTION OF BLADDER TUMOR: SHX2575

## 2022-01-04 SURGERY — TURBT (TRANSURETHRAL RESECTION OF BLADDER TUMOR)
Anesthesia: General | Laterality: Right

## 2022-01-04 MED ORDER — DEXAMETHASONE SODIUM PHOSPHATE 4 MG/ML IJ SOLN
INTRAMUSCULAR | Status: DC | PRN
  Administered 2022-01-04: 5 mg via INTRAVENOUS

## 2022-01-04 MED ORDER — PROPOFOL 10 MG/ML IV BOLUS
INTRAVENOUS | Status: AC
  Filled 2022-01-04: qty 20

## 2022-01-04 MED ORDER — OXYBUTYNIN CHLORIDE 5 MG PO TABS
5.0000 mg | ORAL_TABLET | Freq: Three times a day (TID) | ORAL | 1 refills | Status: DC | PRN
Start: 2022-01-04 — End: 2022-07-12

## 2022-01-04 MED ORDER — IOHEXOL 300 MG/ML  SOLN
INTRAMUSCULAR | Status: DC | PRN
  Administered 2022-01-04: 10 mL

## 2022-01-04 MED ORDER — ACETAMINOPHEN 10 MG/ML IV SOLN: 1000.0000 mg | Freq: Once | INTRAVENOUS | Status: DC | PRN

## 2022-01-04 MED ORDER — ONDANSETRON HCL 4 MG/2ML IJ SOLN
INTRAMUSCULAR | Status: AC
  Filled 2022-01-04: qty 2

## 2022-01-04 MED ORDER — PHENYLEPHRINE HCL-NACL 20-0.9 MG/250ML-% IV SOLN
INTRAVENOUS | Status: DC | PRN
  Administered 2022-01-04: 30 ug/min via INTRAVENOUS

## 2022-01-04 MED ORDER — CEFAZOLIN SODIUM-DEXTROSE 2-4 GM/100ML-% IV SOLN
2.0000 g | Freq: Once | INTRAVENOUS | Status: AC
Start: 2022-01-04 — End: 2022-01-04
  Administered 2022-01-04: 2 g via INTRAVENOUS
  Filled 2022-01-04: qty 100

## 2022-01-04 MED ORDER — ORAL CARE MOUTH RINSE: 15.0000 mL | Freq: Once | OROMUCOSAL | Status: AC

## 2022-01-04 MED ORDER — FENTANYL CITRATE (PF) 100 MCG/2ML IJ SOLN
INTRAMUSCULAR | Status: AC
  Filled 2022-01-04: qty 2

## 2022-01-04 MED ORDER — PHENYLEPHRINE 80 MCG/ML (10ML) SYRINGE FOR IV PUSH (FOR BLOOD PRESSURE SUPPORT)
PREFILLED_SYRINGE | INTRAVENOUS | Status: AC
  Filled 2022-01-04: qty 10

## 2022-01-04 MED ORDER — LACTATED RINGERS IV SOLN: INTRAVENOUS | Status: DC

## 2022-01-04 MED ORDER — CEPHALEXIN 500 MG PO CAPS: 500.0000 mg | ORAL_CAPSULE | Freq: Two times a day (BID) | ORAL | 0 refills | Status: AC

## 2022-01-04 MED ORDER — ONDANSETRON HCL 4 MG/2ML IJ SOLN
INTRAMUSCULAR | Status: DC | PRN
  Administered 2022-01-04: 4 mg via INTRAVENOUS

## 2022-01-04 MED ORDER — GEMCITABINE CHEMO FOR BLADDER INSTILLATION 2000 MG
2000.0000 mg | Freq: Once | INTRAVENOUS | Status: AC
  Administered 2022-01-04: 2000 mg via INTRAVESICAL
  Filled 2022-01-04: qty 2000

## 2022-01-04 MED ORDER — PHENYLEPHRINE 80 MCG/ML (10ML) SYRINGE FOR IV PUSH (FOR BLOOD PRESSURE SUPPORT)
PREFILLED_SYRINGE | INTRAVENOUS | Status: DC | PRN
  Administered 2022-01-04: 80 ug via INTRAVENOUS

## 2022-01-04 MED ORDER — EPHEDRINE SULFATE-NACL 50-0.9 MG/10ML-% IV SOSY
PREFILLED_SYRINGE | INTRAVENOUS | Status: DC | PRN
  Administered 2022-01-04 (×2): 10 mg via INTRAVENOUS

## 2022-01-04 MED ORDER — FENTANYL CITRATE (PF) 100 MCG/2ML IJ SOLN
INTRAMUSCULAR | Status: DC | PRN
Start: 2022-01-04 — End: 2022-01-04
  Administered 2022-01-04 (×2): 50 ug via INTRAVENOUS

## 2022-01-04 MED ORDER — EPHEDRINE 5 MG/ML INJ
INTRAVENOUS | Status: AC
  Filled 2022-01-04: qty 5

## 2022-01-04 MED ORDER — CHLORHEXIDINE GLUCONATE 0.12 % MT SOLN
15.0000 mL | Freq: Once | OROMUCOSAL | Status: AC
  Administered 2022-01-04: 15 mL via OROMUCOSAL

## 2022-01-04 MED ORDER — LIDOCAINE 2% (20 MG/ML) 5 ML SYRINGE
INTRAMUSCULAR | Status: DC | PRN
  Administered 2022-01-04: 60 mg via INTRAVENOUS

## 2022-01-04 MED ORDER — DEXAMETHASONE SODIUM PHOSPHATE 10 MG/ML IJ SOLN
INTRAMUSCULAR | Status: AC
  Filled 2022-01-04: qty 1

## 2022-01-04 MED ORDER — FENTANYL CITRATE PF 50 MCG/ML IJ SOSY: 25.0000 ug | PREFILLED_SYRINGE | INTRAMUSCULAR | Status: DC | PRN

## 2022-01-04 MED ORDER — TRAMADOL HCL 50 MG PO TABS: 50.0000 mg | ORAL_TABLET | Freq: Four times a day (QID) | ORAL | 0 refills | Status: AC | PRN

## 2022-01-04 MED ORDER — SODIUM CHLORIDE 0.9 % IR SOLN
Status: DC | PRN
  Administered 2022-01-04: 9000 mL via INTRAVESICAL

## 2022-01-04 MED ORDER — PHENAZOPYRIDINE HCL 200 MG PO TABS: 200.0000 mg | ORAL_TABLET | Freq: Three times a day (TID) | ORAL | 0 refills | Status: DC | PRN

## 2022-01-04 MED ORDER — LIDOCAINE HCL (PF) 2 % IJ SOLN
INTRAMUSCULAR | Status: AC
  Filled 2022-01-04: qty 5

## 2022-01-04 MED ORDER — PROPOFOL 10 MG/ML IV BOLUS
INTRAVENOUS | Status: DC | PRN
  Administered 2022-01-04: 110 mg via INTRAVENOUS

## 2022-01-04 SURGICAL SUPPLY — 23 items
BAG DRN RND TRDRP ANRFLXCHMBR (UROLOGICAL SUPPLIES) ×1
BAG URINE DRAIN 2000ML AR STRL (UROLOGICAL SUPPLIES) ×1 IMPLANT
BAG URO CATCHER STRL LF (MISCELLANEOUS) ×2 IMPLANT
CATH FOLEY 2WAY SLVR  5CC 18FR (CATHETERS) ×2
CATH FOLEY 2WAY SLVR 5CC 18FR (CATHETERS) IMPLANT
CATH URETL 5X70 OPEN END (CATHETERS) ×2 IMPLANT
DRAPE FOOT SWITCH (DRAPES) ×2 IMPLANT
ELECT REM PT RETURN 15FT ADLT (MISCELLANEOUS) ×2 IMPLANT
GLOVE SURG LX 7.5 STRW (GLOVE) ×1
GLOVE SURG LX STRL 7.5 STRW (GLOVE) ×1 IMPLANT
GOWN STRL REUS W/ TWL XL LVL3 (GOWN DISPOSABLE) ×1 IMPLANT
GOWN STRL REUS W/TWL XL LVL3 (GOWN DISPOSABLE) ×2
GUIDEWIRE ZIPWRE .038 STRAIGHT (WIRE) ×1 IMPLANT
KIT TURNOVER KIT A (KITS) IMPLANT
LOOP CUT BIPOLAR 24F LRG (ELECTROSURGICAL) ×1 IMPLANT
MANIFOLD NEPTUNE II (INSTRUMENTS) ×2 IMPLANT
PACK CYSTO (CUSTOM PROCEDURE TRAY) ×2 IMPLANT
PENCIL SMOKE EVACUATOR (MISCELLANEOUS) IMPLANT
STENT URET 6FRX24 CONTOUR (STENTS) ×1 IMPLANT
SYR TOOMEY IRRIG 70ML (MISCELLANEOUS) ×2
SYRINGE TOOMEY IRRIG 70ML (MISCELLANEOUS) IMPLANT
TUBING CONNECTING 10 (TUBING) ×2 IMPLANT
TUBING UROLOGY SET (TUBING) ×2 IMPLANT

## 2022-01-04 NOTE — Anesthesia Procedure Notes (Signed)
Procedure Name: LMA Insertion ?Date/Time: 01/04/2022 11:30 AM ?Performed by: Claudia Desanctis, CRNA ?Pre-anesthesia Checklist: Emergency Drugs available, Patient identified, Suction available and Patient being monitored ?Patient Re-evaluated:Patient Re-evaluated prior to induction ?Oxygen Delivery Method: Circle system utilized ?Preoxygenation: Pre-oxygenation with 100% oxygen ?Induction Type: IV induction ?Ventilation: Mask ventilation without difficulty ?LMA: LMA inserted ?LMA Size: 5.0 ?Number of attempts: 1 ?Placement Confirmation: positive ETCO2 and breath sounds checked- equal and bilateral ?Tube secured with: Tape ?Dental Injury: Teeth and Oropharynx as per pre-operative assessment  ? ? ? ? ?

## 2022-01-04 NOTE — Anesthesia Postprocedure Evaluation (Signed)
Anesthesia Post Note ? ?Patient: Jason [name] ? ?Procedure(s) Performed: TRANSURETHRAL RESECTION OF BLADDER TUMOR (TURBT) WITH POST OPERATIVE INSTILLATION OF GEMCITABINE/ POSSIBLE RIGHT STENT PLACEMENT (Right) ? ?  ? ?Patient location during evaluation: PACU ?Anesthesia Type: General ?Level of consciousness: sedated ?Pain management: pain level controlled ?Vital Signs Assessment: post-procedure vital signs reviewed and stable ?Respiratory status: spontaneous breathing and respiratory function stable ?Cardiovascular status: stable ?Postop Assessment: no apparent nausea or vomiting ?Anesthetic complications: no ? ? ?No notable events documented. ? ?Last Vitals:  ?Vitals:  ? 01/04/22 1345 01/04/22 1400  ?BP: (!) 165/97 (!) 163/78  ?Pulse: 86 84  ?Resp: 12 13  ?Temp:    ?SpO2: 95% 93%  ?  ?Last Pain:  ?Vitals:  ? 01/04/22 1400  ?TempSrc:   ?PainSc: 0-No pain  ? ? ?  ?  ?  ?  ?  ?  ? ?Virga Haltiwanger DANIEL ? ? ? ? ?

## 2022-01-04 NOTE — Transfer of Care (Signed)
Immediate Anesthesia Transfer of Care Note ? ?Patient: Jason [name] ? ?Procedure(s) Performed: TRANSURETHRAL RESECTION OF BLADDER TUMOR (TURBT) WITH POST OPERATIVE INSTILLATION OF GEMCITABINE/ POSSIBLE RIGHT STENT PLACEMENT (Right) ? ?Patient Location: PACU ? ?Anesthesia Type:General ? ?Level of Consciousness: awake and patient cooperative ? ?Airway & Oxygen Therapy: Patient Spontanous Breathing and Patient connected to face mask ? ?Post-op Assessment: Report given to RN and Post -op Vital signs reviewed and stable ? ?Post vital signs: Reviewed and stable ? ?Last Vitals:  ?Vitals Value Taken Time  ?BP 128/69 01/04/22 1232  ?Temp 36.5 ?C 01/04/22 1232  ?Pulse 82 01/04/22 1235  ?Resp 14 01/04/22 1235  ?SpO2 100 % 01/04/22 1235  ?Vitals shown include unvalidated device data. ? ?Last Pain:  ?Vitals:  ? 01/04/22 1232  ?TempSrc:   ?PainSc: 0-No pain  ?   ? ?  ? ?Complications: No notable events documented. ?

## 2022-01-04 NOTE — Op Note (Signed)
Operative Note ? ?Preoperative diagnosis:  ?1.  2.5 cm papillary bladder mass with features concerning for urothelial carcinoma ? ?Postoperative diagnosis: ?1.  2.5 cm papillary bladder mass with features concerning for urothelial carcinoma ?2.  Right ureteral obstruction secondary to bladder mass overlying the right ureteral orifice  ? ?Procedure(s): ?1.  Cystoscopy with TURBT (medium) ?2.  Right ureteral stent placement ?3.  Right retrograde pyelogram with intraoperative interpretation of fluoroscopic imaging ?4.  Intravesical instillation of gemcitabine ? ?Surgeon: Ellison Hughs, MD ? ?Assistants:  None ? ?Anesthesia:  General ? ?Complications:  None ? ?EBL: Less than 5 mL ? ?Specimens: ?1.  Right bladder mass ? ?Drains/Catheters: ?1.  Right 6 French, 24 cm JJ stent with out tether ?2.  18 French Foley catheter ? ?Intraoperative findings:   ?2.5 cm papillary bladder mass overlying the right ureteral orifice.  No other concerning masses or lesions were seen throughout the bladder mucosa ?Solitary right collecting system with no filling defects or dilation involving the right ureter or right renal pelvis seen on retrograde pyelogram ? ? ?Indication:  Jason [name] is a 88 y.o. male with with a papillary bladder mass with features concerning for urothelial carcinoma.  He has been consented for the above procedures, voices understanding and wishes to proceed. ? ?Description of procedure: ? ?After informed consent was obtained, the patient was brought to the operating room and general anesthesia was administered. The patient was then placed in the dorsolithotomy position and prepped and draped in the usual sterile fashion. A timeout was performed. A 23 French rigid cystoscope was then inserted into the urethral meatus and advanced into the bladder under direct vision. A complete bladder survey revealed a large papillary bladder mass overlying the right ureteral orifice.  No other intravesical abnormalities  were seen. ? ?The rigid cystoscope was then exchanged for a 26 French resectoscope with a bipolar loop working element.  The papillary bladder mass was then systematically resected down to the detrusor musculature.  The right ureteral orifice was resected as it was extensively involved with tumor.  After all of the tumor was resected and evacuated from the bladder, the area of resection was then extensively fulgurated until hemostasis was achieved. ? ?A Glidewire was then used to probe the area of the I suspected the ureteral orifice to be, which I eventually found.  A ureteral catheter was then advanced over the wire and a right retrograde pyelogram was obtained, with the findings listed above.  A 6 French, 24 cm JJ stent was then placed over the wire and into good position within the right collecting system, confirming placement via fluoroscopy. ? ?Reinspection of the bladder revealed no evidence of bladder perforation and the resection bed was hemostatic.  An 54 French Foley catheter was placed and set to gravity drainage.  Patient tolerated the procedure well and was transferred to the postanesthesia in stable condition. ? ?While in the recovery room 2000 mg of gemcitabine in 50 mL of water was instilled in the bladder through the catheter and the catheter was plugged. This will remain indwelling for approximately one hour. It will then be drained from the bladder and the catheter will be removed and the patient discharged home. ? ?Plan: Keep intravesical gemcitabine and for 1 hour.  Plan to DC home without Foley catheter.  Follow-up in 2 weeks for office cystoscopy and stent removal. ? ?

## 2022-01-04 NOTE — H&P (Signed)
PRE-OP H&P ? ?Office Visit Report     12/26/2021  ? ?-------------------------------------------------------------------------------- ?  ?Jason [name]  ?MRN: [id number]  ?DOB: July 21, 1926, 88 year old Male  ?PRIMARY CARE:  Mark A. Perini, MD  ?REFERRINGGlena Norfolk. Lovena Neighbours, MD  ?PROVIDER:  Ellison Hughs, M.D.  ?TREATING:  Jiles Crocker, NP  ?LOCATION:  [facility] [phone]  ?  ? ?-------------------------------------------------------------------------------- ?  ?CC/HPI: CC: Prostate cancer  ? ?HPI: [name] is a 88 year old male with a history of metastatic stage IIIB (T4a,N1b) colon cancer (recently underwent partial colectomy, aduvant chemotherapy) T1c, Gleason 6 prostate cancer that was diagnosed in 2010 (currently on active surveillance).  ? ?Last PSA- 97.5 (07/2020), 73.30 (01/2020), 78.60 (07/2019), 61.20 99/2020), 88.9 (11/2018)*, 54.60 (09/2018), 51.40 (03/2018), 48.40 (09/2017), 38.90 (03/2017).  ? ?08/18/19: The patient is here today for a routine follow-up. He reports stable LUTS since his last office visit. He reports occasional episodes of incomplete bladder emptying and nocturia x2-3. He denies interval UTIs, dysuria or gross hematuria. He does report recent issues with constipation and is currently being treated with MiraLax and Metamucil, which are helping in that regard. He denies any new or worsening joint/back pain. He does report ongoing right-sided knee pain that has been present for the past several years for which he receives interval steroid injections for. Overall, doing well.  ? ?02/19/20: The patient is here today for a routine follow-up. CT and bone scans from 09/2019 were negative for overt metastatic prostate cancer. He denies any new voiding sxs today. Denies interval UTIs, dysuria or hematuria. PVR-58m. Constipation has improved with stool softeners. He denies any new or worsening joint/back pain, but states that he has started using a walker more  frequently over the past 6 months. He also denies any recent unintentional weight loss. Overall, doing well.  ? ?08/17/20: The patient is here today for a routine follow-up. His surveillance CT and bone scan earlier this month showed no evidence of metastatic cancer. He continues to be very active and has good quality of life. He is urinating without difficulty and denies interval UTIs, dysuria or hematuria. He also denies any significant weight loss or new/worsening joint pain.  ? ?01/14/2021: The patient presents today with a 3-day history of gross hematuria associated with mild dysuria at the end of his void. He denies flank pain, nausea/vomiting or fever/chills. He states that he feels like he has a good force of stream and is emptying his bladder well. The patient is legally blind, but his wife, who was present during today's visit, reports that his urine is light pink and is without clots.  ? ?01/27/2021: Patient held his anticoagulation therapy for 2 days after last office visit. He was empirically treated for suspected infection but urine culture resulted negative. He completed antimicrobial treatment as prescribed. Hematuria has since resolved. No longer having any burning or painful urination. Voiding at his baseline per him and his wife's report today. UA clear today.  ? ?02/15/21: The patient is here today for a routine follow-up. The patient is here today for routine follow-up. He reports complete resolution of his recent episode of gross hematuria. He states that he held his Eliquis for approximately 48 hours, which was improved his hematuria. Currently, he is voiding without difficulty and denies interval UTIs or dysuria. He also denies significant weight loss or new/worsening joint or back pain.  ? ?06/24/2021 (OV with Dr EJunious Silk Patient added on for hematuria. He has a history  of Gleason 6 prostate cancer diagnosed in 2010 on active surveillance with a PSA of 115. Last CT scan 1221 showed no  metastatic disease and about a 52 g prostate. Patient has noticed three days of dysuria and red urine and no clots. Urine is lighter today. Saw Dr. Joylene Draft PA and started Cipro two days ago. He is voiding OK but doesn't feel like he empties. He strains some. Not bothered. His Hgb was "14" two days ago and "he's not bleeding that bad" per his wife - a retired [employer] scientist.  ? ?He had a similar episode in May 2022 which cleared with antibiotics. Urine culture was negative. Hemoglobin was 39. He takes Eliquis for A fib.  ? ?07/08/2021: Increased bladder residual above baseline assessed at last office visit. Discussed role of alpha-blocker therapy but patient and wife did not feel ready to start the medication. Urine culture assessed was negative for bacterial growth. Dr. Junious Silk recommended checking again with Dr. Martinique about holding Eliquis despite the patient and family member stating that he had previously given the okay to do so with recurrent hematuria. CT hematuria evaluation was grossly reassuring. He had some small nonobstructing bilateral renal calculi. No evidence of GU mass/lesion or malignancy. Mildly enlarged prostate.  ? ?Now back today for repeat evaluation. Hematuria has since resolved. He did stop his Eliquis for few days with permission from cardiology. He has now back on the anticoagulation therapy. Voiding symptoms grossly stable, somewhat improved compared to last office visit. He has stable frequency/urgency and nocturia, occasional intermittency of stream but feels like he is emptying appropriately overall. No interval recurrence of pain/discomfort suggestive of obstructive uropathy. Denies any interval fevers or chills, nausea/vomiting. Urinalysis today is clear. PVR is less than 3 ounces.  ? ?08/05/2021: Back today with recurrence of gross hematuria. Symptoms began approximately 1 week ago in the late afternoon. He held his anticoagulation therapy beginning on Monday for 2 days. He saw primary care  on Wednesday who had him decrease Eliquis from 5 mg twice daily to 2-1/2 mg twice daily which he continues through today's visit. Voiding symptoms are grossly stable outside of the hematuria which he is seeing with each void. He is not passing any clots. He feels like he is emptying appropriately. Denies any correlating lower back or flank pain/discomfort. Not associated with fevers or chills, nausea/vomiting.  ? ?09/12/21: The patient is here today for a routine follow-up. He states that his recent episode of hematuria has since resolved. Eliquis dose has been reduced to 2.5 mg BID and he notes no hematuria since the dose reduction. Denies interval UTIs, dysuria or hematuria. He reports stable weight. His notes left shoulder pain that started around December and mild, stable low back pain.  ? ?10/17/2021: The patient presents today to discuss his CT findings, which revealed an enhancing nodule adjacent to the right ureteral orifice measuring approximately 1.9 cm in greatest dimension on CT from 10/05/2021. His scan showed no signs of metastatic disease or any other acute findings. Currently, the patient states that he is urinating without difficulty and denies any recent episodes of gross hematuria.  ? ?11/21/2021: The patient is here today to discuss treatment of his known bladder tumor. He has met with his primary care physician and cardiologist, and the patient states that they feel like he is a suitable candidate for the operation. He denies interval episodes of gross hematuria, dysuria or UTIs. He is accompanied by his wife during today's visit.  ? ?12/26/2021: Here today  for preoperative appointment prior to undergoing TURBT with instillation of intravesical gemcitabine as well as possible right ureteral stent placement on 5/10 with Dr. Lovena Neighbours. Today doing well. Denies interval recurrence of gross hematuria. Overall voiding at his baseline with stable symptomology. He has had no interval dysuria, generally feels  like he empties his bladder well. UA today continues with microscopic hematuria but to a lesser severity when compared to previous office visit assessment. He denies any changes in past medical history, prescription me

## 2022-01-05 ENCOUNTER — Encounter (HOSPITAL_COMMUNITY): Payer: Self-pay | Admitting: Urology

## 2022-01-05 DIAGNOSIS — R338 Other retention of urine: Secondary | ICD-10-CM | POA: Diagnosis not present

## 2022-01-05 DIAGNOSIS — R310 Gross hematuria: Secondary | ICD-10-CM | POA: Diagnosis not present

## 2022-01-06 LAB — SURGICAL PATHOLOGY

## 2022-01-09 DIAGNOSIS — R310 Gross hematuria: Secondary | ICD-10-CM | POA: Diagnosis not present

## 2022-01-09 DIAGNOSIS — C672 Malignant neoplasm of lateral wall of bladder: Secondary | ICD-10-CM | POA: Diagnosis not present

## 2022-01-16 DIAGNOSIS — N390 Urinary tract infection, site not specified: Secondary | ICD-10-CM | POA: Diagnosis not present

## 2022-01-16 DIAGNOSIS — C672 Malignant neoplasm of lateral wall of bladder: Secondary | ICD-10-CM | POA: Diagnosis not present

## 2022-01-16 DIAGNOSIS — R310 Gross hematuria: Secondary | ICD-10-CM | POA: Diagnosis not present

## 2022-02-08 DIAGNOSIS — Z23 Encounter for immunization: Secondary | ICD-10-CM | POA: Diagnosis not present

## 2022-03-06 DIAGNOSIS — C61 Malignant neoplasm of prostate: Secondary | ICD-10-CM | POA: Diagnosis not present

## 2022-03-13 DIAGNOSIS — R972 Elevated prostate specific antigen [PSA]: Secondary | ICD-10-CM | POA: Diagnosis not present

## 2022-03-13 DIAGNOSIS — C672 Malignant neoplasm of lateral wall of bladder: Secondary | ICD-10-CM | POA: Diagnosis not present

## 2022-03-13 DIAGNOSIS — Z8551 Personal history of malignant neoplasm of bladder: Secondary | ICD-10-CM | POA: Diagnosis not present

## 2022-03-20 DIAGNOSIS — E291 Testicular hypofunction: Secondary | ICD-10-CM | POA: Diagnosis not present

## 2022-03-20 DIAGNOSIS — D649 Anemia, unspecified: Secondary | ICD-10-CM | POA: Diagnosis not present

## 2022-03-20 DIAGNOSIS — R7301 Impaired fasting glucose: Secondary | ICD-10-CM | POA: Diagnosis not present

## 2022-03-20 DIAGNOSIS — R7989 Other specified abnormal findings of blood chemistry: Secondary | ICD-10-CM | POA: Diagnosis not present

## 2022-03-20 DIAGNOSIS — E785 Hyperlipidemia, unspecified: Secondary | ICD-10-CM | POA: Diagnosis not present

## 2022-03-20 DIAGNOSIS — E538 Deficiency of other specified B group vitamins: Secondary | ICD-10-CM | POA: Diagnosis not present

## 2022-03-20 DIAGNOSIS — R5383 Other fatigue: Secondary | ICD-10-CM | POA: Diagnosis not present

## 2022-03-20 DIAGNOSIS — Z125 Encounter for screening for malignant neoplasm of prostate: Secondary | ICD-10-CM | POA: Diagnosis not present

## 2022-03-20 DIAGNOSIS — C61 Malignant neoplasm of prostate: Secondary | ICD-10-CM | POA: Diagnosis not present

## 2022-03-27 DIAGNOSIS — M8580 Other specified disorders of bone density and structure, unspecified site: Secondary | ICD-10-CM | POA: Diagnosis not present

## 2022-03-27 DIAGNOSIS — R7301 Impaired fasting glucose: Secondary | ICD-10-CM | POA: Diagnosis not present

## 2022-03-27 DIAGNOSIS — I739 Peripheral vascular disease, unspecified: Secondary | ICD-10-CM | POA: Diagnosis not present

## 2022-03-27 DIAGNOSIS — C61 Malignant neoplasm of prostate: Secondary | ICD-10-CM | POA: Diagnosis not present

## 2022-03-27 DIAGNOSIS — Z952 Presence of prosthetic heart valve: Secondary | ICD-10-CM | POA: Diagnosis not present

## 2022-03-27 DIAGNOSIS — R82998 Other abnormal findings in urine: Secondary | ICD-10-CM | POA: Diagnosis not present

## 2022-03-27 DIAGNOSIS — Z1339 Encounter for screening examination for other mental health and behavioral disorders: Secondary | ICD-10-CM | POA: Diagnosis not present

## 2022-03-27 DIAGNOSIS — M179 Osteoarthritis of knee, unspecified: Secondary | ICD-10-CM | POA: Diagnosis not present

## 2022-03-27 DIAGNOSIS — Z23 Encounter for immunization: Secondary | ICD-10-CM | POA: Diagnosis not present

## 2022-03-27 DIAGNOSIS — C679 Malignant neoplasm of bladder, unspecified: Secondary | ICD-10-CM | POA: Diagnosis not present

## 2022-03-27 DIAGNOSIS — Z1331 Encounter for screening for depression: Secondary | ICD-10-CM | POA: Diagnosis not present

## 2022-03-27 DIAGNOSIS — Z9989 Dependence on other enabling machines and devices: Secondary | ICD-10-CM | POA: Diagnosis not present

## 2022-03-27 DIAGNOSIS — I480 Paroxysmal atrial fibrillation: Secondary | ICD-10-CM | POA: Diagnosis not present

## 2022-03-27 DIAGNOSIS — D6869 Other thrombophilia: Secondary | ICD-10-CM | POA: Diagnosis not present

## 2022-03-27 DIAGNOSIS — Z0000 Encounter for general adult medical examination without abnormal findings: Secondary | ICD-10-CM | POA: Diagnosis not present

## 2022-04-03 DIAGNOSIS — H401113 Primary open-angle glaucoma, right eye, severe stage: Secondary | ICD-10-CM | POA: Diagnosis not present

## 2022-04-03 DIAGNOSIS — H401123 Primary open-angle glaucoma, left eye, severe stage: Secondary | ICD-10-CM | POA: Diagnosis not present

## 2022-04-10 ENCOUNTER — Other Ambulatory Visit (HOSPITAL_COMMUNITY): Payer: Self-pay | Admitting: *Deleted

## 2022-04-11 ENCOUNTER — Ambulatory Visit (HOSPITAL_COMMUNITY)
Admission: RE | Admit: 2022-04-11 | Discharge: 2022-04-11 | Disposition: A | Discharge status: Home / Self Care | Payer: Medicare Other | Source: Ambulatory Visit | Attending: Internal Medicine | Admitting: Internal Medicine

## 2022-04-11 DIAGNOSIS — M810 Age-related osteoporosis without current pathological fracture: Secondary | ICD-10-CM | POA: Diagnosis not present

## 2022-04-11 MED ORDER — DENOSUMAB 60 MG/ML ~~LOC~~ SOSY
60.0000 mg | PREFILLED_SYRINGE | Freq: Once | SUBCUTANEOUS | Status: AC
  Administered 2022-04-11: 60 mg via SUBCUTANEOUS

## 2022-04-11 MED ORDER — DENOSUMAB 60 MG/ML ~~LOC~~ SOSY
PREFILLED_SYRINGE | SUBCUTANEOUS | Status: AC
  Filled 2022-04-11: qty 1

## 2022-06-15 DIAGNOSIS — Z8551 Personal history of malignant neoplasm of bladder: Secondary | ICD-10-CM | POA: Diagnosis not present

## 2022-06-15 DIAGNOSIS — R972 Elevated prostate specific antigen [PSA]: Secondary | ICD-10-CM | POA: Diagnosis not present

## 2022-06-28 ENCOUNTER — Ambulatory Visit (INDEPENDENT_AMBULATORY_CARE_PROVIDER_SITE_OTHER): Payer: Medicare Other | Admitting: Neurology

## 2022-06-28 ENCOUNTER — Encounter: Payer: Self-pay | Admitting: Hematology

## 2022-06-28 ENCOUNTER — Encounter: Payer: Self-pay | Admitting: Neurology

## 2022-06-28 VITALS — BP 100/58 | HR 87 | Ht 70.0 in | Wt 167.8 lb

## 2022-06-28 DIAGNOSIS — I2510 Atherosclerotic heart disease of native coronary artery without angina pectoris: Secondary | ICD-10-CM

## 2022-06-28 DIAGNOSIS — R490 Dysphonia: Secondary | ICD-10-CM | POA: Insufficient documentation

## 2022-06-28 DIAGNOSIS — I480 Paroxysmal atrial fibrillation: Secondary | ICD-10-CM

## 2022-06-28 DIAGNOSIS — G4733 Obstructive sleep apnea (adult) (pediatric): Secondary | ICD-10-CM | POA: Diagnosis not present

## 2022-06-28 DIAGNOSIS — H548 Legal blindness, as defined in USA: Secondary | ICD-10-CM | POA: Diagnosis not present

## 2022-06-28 DIAGNOSIS — H903 Sensorineural hearing loss, bilateral: Secondary | ICD-10-CM

## 2022-06-28 NOTE — Progress Notes (Signed)
SLEEP MEDICINE CLINIC    Provider:  Larey Seat, MD  Primary Care Physician:  Jason Infante, MD [place] [address]     Referring Provider: Crist [name], [address] [place],  [place] [address]          Chief Complaint according to patient   Patient presents with:     New Patient (Initial Visit)           HISTORY OF PRESENT ILLNESS:  Jason [name] is a 88 y.o.  male patient of Jason [name]'s who is seen here in a new patient visit on 06-28-2022,( I have no records on EPIC about previous encounters).  Visit on  06/28/2022 from Jason [name]  for a complete re evaluation- .  Chief concern according to patient :  " Jason [name] had ordered a sleep study, at [facility] heart and sleep , about 15 years ago. He is likely on his second machine which started to display  "end of life of motor" message. [facility] is DME.    I have the pleasure of meeting Jason [name]  today who has been a compliant CPAP user for over a decade.  His last machine was set up by [facility] on 05-24-2017 and this just over the past years old.  The patient has been 100% compliant by days and hours on average he is using the machine 8-1/2 hours at night.  This machine is set to a pressure of 11 cm water without expiratory relief.  Notable is a high air leak at the 95th percentile of 71.6 L/min.  This may increase artificially the true apnea hypopnea index.  Currently AHI is measured at 8.9/h no central apneas seem to arise and 5.2 events per hour are listed as unknown which is usually meaning related to air leak. He is on an ESON nasal mask, medium size but his mouth drops open. He is legally blind and severely hard of hearing.    I have the pleasure of seeing Jason [name] today, a right-handed White or Caucasian male with a known OSA sleep disorder.  He  has a past medical history of Abnormal PSA, Adenomatous polyp of colon (2010 and before 2006), Anemia, Anxiety, Aortic stenosis, Aortic  valve prosthesis present, Atrial fibrillation ([place]), CAD (coronary artery disease), DDD (degenerative disc disease), lumbar, Depression, Diverticulosis, Dyslipidemia, GERD (gastroesophageal reflux disease), Glaucoma, History of blood transfusion (09/29/2016), History of kidney stones, CABG, Hypogonadism, male, Macular degeneration, OSA (obstructive sleep apnea), Osteoarthritis, Osteopenia, and Prostate CA ([place]) (prostate 2007).     Sleep relevant medical history: Nocturia , 2-3 - yes: Tonsillectomy, no cervical spine surgery,   Family medical /sleep history: No other family member on CPAP with OSA,  2 adult sons are not affected. .    Social history:  Patient is  retired from [employer] and lives in a household with spouse,  alone. Family status is married , with 2  adult children, 2 grandchildren.  Pets are not present. Tobacco use: 40 pack years , quit 25 years ago .  ETOH use: glass of wine 2 a week ,  Caffeine intake in form of Coffee( 2 cups a day) Soda( /) Tea ( 1 a day) or energy drinks. Regular exercise in form of walking. .     Sleep habits are as follows: The patient's dinner time is between 5.30 PM. The patient goes to bed at 10 PM and has left leg pain that keeps hurting"  and keeps him up.  continues to sleep for 6 hours, wakes for 2-3 bathroom breaks,.   The preferred sleep position is supine , with the support of 1 pillow.  Dreams are reportedly rare.  7.30  AM is the usual rise time. The patient wakes up spontaneously 6-6.30  H e reports not feeling refreshed or restored in AM, with symptoms such as dry mouth, leg pain-  and residual fatigue. Naps are taken frequently, lasting from 30 to 45 minutes , usually while he listens to audio books.    Review of Systems: Out of a complete 14 system review, the patient complains of only the following symptoms, and all other reviewed systems are negative.:  Fatigue, sleepiness , snoring, fragmented sleep, left leg pain from back  pain.    How likely are you to doze in the following situations: 0 = not likely, 1 = slight chance, 2 = moderate chance, 3 = high chance   Sitting and Reading? Watching Television? Sitting inactive in a public place (theater or meeting)? As a passenger in a car for an hour without a break? Lying down in the afternoon when circumstances permit? Sitting and talking to someone? Sitting quietly after lunch without alcohol? In a car, while stopped for a few minutes in traffic?   Total = 10/ 24 points on cpap   FSS endorsed at 26/ 63 points.  GDS 5/ 15   Deaf, blind. Walker  Social History   Socioeconomic History   Marital status: Married    Spouse name: Jason [name]   Number of children: 2   Years of education: Not on file   Highest education level: Not on file  Occupational History   Not on file  Tobacco Use   Smoking status: Former    Packs/day: 1.00    Years: 40.00    Total pack years: 40.00    Types: Cigarettes    Quit date: 11/16/1990    Years since quitting: 31.6   Smokeless tobacco: Never  Vaping Use   Vaping Use: Never used  Substance and Sexual Activity   Alcohol use: Yes    Alcohol/week: 2.0 standard drinks of alcohol    Types: 2 Glasses of wine per week    Comment: wine few x per week   Drug use: No   Sexual activity: Not on file  Other Topics Concern   Not on file  Social History Narrative   Right handed   Caffeine use: coffee/tea (2-3 cups coffee per day, 1 tea per day)   Lives with wife   Social Determinants of Health   Financial Resource Strain: Not on file  Food Insecurity: Not on file  Transportation Needs: Not on file  Physical Activity: Not on file  Stress: Not on file  Social Connections: Not on file    Family History  Problem Relation Age of Onset   Heart attack Father    Colon cancer Neg Hx    Esophageal cancer Neg Hx    Stomach cancer Neg Hx    Rectal cancer Neg Hx     Past Medical History:  Diagnosis Date   Abnormal PSA     Adenomatous polyp of colon 2010 and before 2006   Anemia    Anxiety    Aortic stenosis    Aortic valve prosthesis present    Atrial fibrillation (HCC)    CAD (coronary artery disease)    DDD (degenerative disc disease), lumbar    Depression    Diverticulosis  Dyslipidemia    GERD (gastroesophageal reflux disease)    Glaucoma    History of blood transfusion 09/29/2016   History of kidney stones    Hx of CABG    Hypogonadism, male    Macular degeneration    OSA (obstructive sleep apnea)    Osteoarthritis    Osteopenia    Prostate CA ([place]) prostate 2007   colon dx 2018, watching psa levels, no tx yet for prostate    Past Surgical History:  Procedure Laterality Date   AORTIC VALVE REPLACEMENT  October 2011   Magna Ease pericardial tissue valve #41m   BACK SURGERY  1978   lower   CATARACT EXTRACTION Bilateral    CORNEAL TRANSPLANT Right    CORONARY ARTERY BYPASS GRAFT  October 2011   LIMA to LAD   ESOPHAGOGASTRODUODENOSCOPY N/A 09/16/2014   Procedure: ESOPHAGOGASTRODUODENOSCOPY (EGD);  Surgeon: JIrene Shipper MD;  [facility];  Service: Endoscopy;  Laterality: N/A;   TONSILLECTOMY  76 yrs ago   TRANSURETHRAL RESECTION OF BLADDER TUMOR Right 01/04/2022   Procedure: TRANSURETHRAL RESECTION OF BLADDER TUMOR (TURBT) WITH POST OPERATIVE INSTILLATION OF GEMCITABINE/ POSSIBLE RIGHT STENT PLACEMENT;  Surgeon: WCeasar Mons MD;  Location: [facility];  Service: Urology;  Laterality: Right;     Current Outpatient Medications on File Prior to Visit  Medication Sig Dispense Refill   acetaminophen (TYLENOL) 500 MG tablet Take 1,000 mg by mouth in the morning and at bedtime.     amitriptyline (ELAVIL) 25 MG tablet Take 25 mg by mouth at bedtime.     amoxicillin (AMOXIL) 500 MG capsule Take 4 capsules by mouth 30-60 minutes prio to dental work 4 capsule 3   apixaban (ELIQUIS) 5 MG TABS tablet Take 2.5 mg by mouth 2 (two) times daily.     brimonidine (ALPHAGAN) 0.2 %  ophthalmic solution Place 1 drop into both eyes in the morning and at bedtime.  11   Calcium Carbonate-Vitamin D (CALCIUM-VITAMIN D) 500-200 MG-UNIT per tablet Take 1 tablet by mouth daily.     Cholecalciferol 1000 UNITS capsule Take 1,000 Units by mouth daily.      diclofenac Sodium (VOLTAREN) 1 % GEL Apply 1 application. topically in the morning and at bedtime.     dorzolamide-timolol (COSOPT) 22.3-6.8 MG/ML ophthalmic solution Place 1 drop into both eyes 2 (two) times daily.     fluticasone (FLONASE) 50 MCG/ACT nasal spray Place 1 spray into both nostrils daily as needed for allergies or rhinitis.      gabapentin (NEURONTIN) 300 MG capsule Take 300 mg by mouth 4 (four) times daily.     ipratropium (ATROVENT) 0.06 % nasal spray Place 2 sprays into both nostrils in the morning and at bedtime.     latanoprost (XALATAN) 0.005 % ophthalmic solution Place 1 drop into both eyes daily.  11   metoprolol tartrate (LOPRESSOR) 25 MG tablet Take 1 tablet (25 mg total) by mouth 2 (two) times daily. (Patient taking differently: Take 12.5 mg by mouth 2 (two) times daily.) 180 tablet 3   Multiple Vitamin (MULTIVITAMIN WITH MINERALS) TABS tablet Take 1 tablet by mouth daily.     oxybutynin (DITROPAN) 5 MG tablet Take 1 tablet (5 mg total) by mouth every 8 (eight) hours as needed for bladder spasms. 30 tablet 1   pantoprazole (PROTONIX) 40 MG tablet Take 40 mg by mouth daily.     perphenazine (TRILAFON) 2 MG tablet Take 2 mg by mouth 3 (three) times a week.  phenazopyridine (PYRIDIUM) 200 MG tablet Take 1 tablet (200 mg total) by mouth 3 (three) times daily as needed (for pain with urination). 30 tablet 0   prednisoLONE acetate (PRED FORTE) 1 % ophthalmic suspension Place 1 drop into the right eye daily.     psyllium (METAMUCIL) 58.6 % powder Take 1 packet by mouth daily.     rosuvastatin (CRESTOR) 5 MG tablet Take 5 mg by mouth every Monday, Wednesday, and Friday.     tamsulosin (FLOMAX) 0.4 MG CAPS capsule  Take 0.4 mg by mouth daily.     vitamin B-12 (CYANOCOBALAMIN) 1000 MCG tablet Take 1,000 mcg by mouth daily.     No current facility-administered medications on file prior to visit.    Allergies  Allergen Reactions   Xarelto [Rivaroxaban]     GI BLEED    Physical exam:  Today's Vitals   06/28/22 0934  BP: (!) 100/58  Pulse: 87  SpO2: 99%  Weight: 167 lb 12.8 oz (76.1 kg)  Height: '5\' 10"'$  (1.778 m)   Body mass index is 24.08 kg/m.   Wt Readings from Last 3 Encounters:  06/28/22 167 lb 12.8 oz (76.1 kg)  01/04/22 163 lb (73.9 kg)  12/28/21 163 lb (73.9 kg)     Ht Readings from Last 3 Encounters:  06/28/22 '5\' 10"'$  (1.778 m)  01/04/22 '5\' 10"'$  (1.778 m)  12/28/21 '5\' 10"'$  (1.778 m)      General: The patient is awake, alert and appears not in acute distress. The patient is well groomed. Head: Normocephalic, atraumatic. Neck is supple. Mallampati 1,  neck circumference:16.5  inches . Nasal airflow barely patent.   Retrognathia is not seen.  Dental status: biologic  Cardiovascular:  irregular rate and cardiac rhythm by pulse,  without distended neck veins. Respiratory: Lungs are clear to auscultation.  Skin:  Without evidence of ankle edema, or rash. Trunk: The patient's posture is erect.   Neurologic exam : The patient is awake and alert, oriented to place and time.   Memory subjective described as intact.  Attention span & concentration ability appears normal.  Speech is fluent,  with  dysphonia Mood and affect are appropriate.   Cranial nerves: no loss of smell or taste reported  Pupils are equal and briskly reactive to light. Funduscopic exam deferred. .  Extraocular movements in vertical and horizontal planes were intact and without nystagmus. No Diplopia. Legally blind.  Hearing was severely impaired   Facial sensation intact to fine touch.  Facial motor strength is symmetric and tongue and uvula move midline.  Neck ROM : rotation, tilt and flexion extension were  normal for age and shoulder shrug was symmetrical.    Motor exam:  Symmetric bulk, tone and ROM.   Normal tone without cog -wheeling, symmetric grip strength .   Sensory:  Fine touch  and vibration were tested  - absent in left knee and ankle.  Proprioception tested in the upper extremities was normal.   Coordination: Rapid alternating movements in the fingers/hands were of normal speed.  The Finger-to-nose maneuver was intact without evidence of ataxia, dysmetria or tremor.   Gait and station: Patient could rise assisted from a seated position, walked with walker for assistive device.  Stance is of normal width/ base .  Toe and heel walk were deferred.  Deep tendon reflexes: in the  upper and lower extremities were symmetric and intact.  Babinski response was deferred .       After spending a total time of  45  minutes face to face and additional time for physical and neurologic examination, review of laboratory studies,  personal review of imaging studies, reports and results of other testing and review of referral information / records as far as provided in visit, I have established the following assessments:   2) there is longstanding history of OSA, but he has some risk factors for CSA, and atrial fibrillation warrants a new evaluation.    3)A fib onset after open heart valve replacement. Porcine valve   EDS)  daily naps.   3)  compliant CPAP user   My Plan is to proceed with:  HST ,  Patient is eager to not sleep in the lab, blind, deaf and walking problems- I will order a HST.   His mouth drops open at night, he may have to switch to a full face mask or use a chin strap.    I would like to thank Jason Infante, MD and Jason [name], [place],  [place] [address] for allowing me to meet with and to take care of this pleasant patient.   In short, Jason [name] is presenting with partially treated OSA, and uses a machine of 88 years of age, needs to be  replaced. , I plan to follow up either personally or through our NP within 3 months.   CC: I will share my notes with PCP .  Electronically signed by: Jason Seat, MD 06/28/2022 10:20 AM  [facility] Board certified by The [employer] of Sleep Medicine and Diplomate of the [employer] of Sleep Medicine. Board certified In Neurology through the [place], Fellow of the [employer] of Neurology. Medical Director of [employer].

## 2022-06-30 DIAGNOSIS — I480 Paroxysmal atrial fibrillation: Secondary | ICD-10-CM | POA: Diagnosis not present

## 2022-06-30 DIAGNOSIS — R0602 Shortness of breath: Secondary | ICD-10-CM | POA: Diagnosis not present

## 2022-06-30 DIAGNOSIS — Z952 Presence of prosthetic heart valve: Secondary | ICD-10-CM | POA: Diagnosis not present

## 2022-06-30 DIAGNOSIS — I350 Nonrheumatic aortic (valve) stenosis: Secondary | ICD-10-CM | POA: Diagnosis not present

## 2022-06-30 DIAGNOSIS — G4733 Obstructive sleep apnea (adult) (pediatric): Secondary | ICD-10-CM | POA: Diagnosis not present

## 2022-06-30 DIAGNOSIS — D500 Iron deficiency anemia secondary to blood loss (chronic): Secondary | ICD-10-CM | POA: Diagnosis not present

## 2022-07-04 DIAGNOSIS — Z23 Encounter for immunization: Secondary | ICD-10-CM | POA: Diagnosis not present

## 2022-07-06 ENCOUNTER — Ambulatory Visit: Payer: Medicare Other | Admitting: Nurse Practitioner

## 2022-07-06 ENCOUNTER — Other Ambulatory Visit (HOSPITAL_COMMUNITY): Payer: Self-pay | Admitting: *Deleted

## 2022-07-07 ENCOUNTER — Encounter (HOSPITAL_COMMUNITY)
Admission: RE | Admit: 2022-07-07 | Discharge: 2022-07-07 | Disposition: A | Discharge status: Home / Self Care | Payer: Medicare Other | Source: Ambulatory Visit | Attending: Internal Medicine | Admitting: Internal Medicine

## 2022-07-07 DIAGNOSIS — R0609 Other forms of dyspnea: Secondary | ICD-10-CM | POA: Insufficient documentation

## 2022-07-07 DIAGNOSIS — Z952 Presence of prosthetic heart valve: Secondary | ICD-10-CM | POA: Diagnosis not present

## 2022-07-07 DIAGNOSIS — I4821 Permanent atrial fibrillation: Secondary | ICD-10-CM | POA: Insufficient documentation

## 2022-07-07 DIAGNOSIS — I25810 Atherosclerosis of coronary artery bypass graft(s) without angina pectoris: Secondary | ICD-10-CM | POA: Insufficient documentation

## 2022-07-07 DIAGNOSIS — R072 Precordial pain: Secondary | ICD-10-CM | POA: Diagnosis not present

## 2022-07-07 MED ORDER — SODIUM CHLORIDE 0.9 % IV SOLN
510.0000 mg | INTRAVENOUS | Status: DC
  Administered 2022-07-07: 510 mg via INTRAVENOUS
  Filled 2022-07-07: qty 510

## 2022-07-12 ENCOUNTER — Encounter (INDEPENDENT_AMBULATORY_CARE_PROVIDER_SITE_OTHER): Payer: Medicare Other | Admitting: Physician Assistant

## 2022-07-12 ENCOUNTER — Telehealth: Payer: Self-pay | Admitting: Neurology

## 2022-07-12 ENCOUNTER — Encounter: Payer: Self-pay | Admitting: Physician Assistant

## 2022-07-12 VITALS — BP 136/78 | HR 93 | Ht 70.0 in | Wt 170.0 lb

## 2022-07-12 DIAGNOSIS — I4821 Permanent atrial fibrillation: Secondary | ICD-10-CM

## 2022-07-12 DIAGNOSIS — I25810 Atherosclerosis of coronary artery bypass graft(s) without angina pectoris: Secondary | ICD-10-CM

## 2022-07-12 DIAGNOSIS — R0609 Other forms of dyspnea: Secondary | ICD-10-CM

## 2022-07-12 DIAGNOSIS — Z952 Presence of prosthetic heart valve: Secondary | ICD-10-CM | POA: Diagnosis not present

## 2022-07-12 DIAGNOSIS — R072 Precordial pain: Secondary | ICD-10-CM

## 2022-07-12 NOTE — Addendum Note (Signed)
Addended by: Merri Ray A on: 07/12/2022 11:22 AM   Modules accepted: Orders

## 2022-07-12 NOTE — Progress Notes (Signed)
Cardiology Office Note:    Date:  07/12/2022   ID:  Jason [name], DOB 1926-05-19, MRN [id number]  PCP:  [name], [place] Cardiologist: Peter Martinique, MD  Reason for visit: Dyspnea on exertion  History of Present Illness:    Jason [name] is a 88 y.o. male with a hx of  CAD s/p CABG with AVR in October 2011, history of prostate cancer, hyperlipidemia, OSA on CPAP and chronic atrial fibrillation.  When he had AVR with tissue valve prosthesis, he underwent LIMA to LAD.  At the time, RCA had a 40 to 50% stenosis that was treated medically.  He has been anticoagulated with Xarelto. He was admitted in January 2016 with GI bleed.  His Xarelto was held.  He did not require blood transfusion.  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  Afterward, he was placed on Coumadin.  He had recurrent anemia in May 2017 and required blood transfusion.  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  Afterward, he returned with complaint of GI bleed.  He was evaluated by Dr. Curt Bears in March 2018, beta-blocker was increased.  There was also some wide-complex tachycardia consistent with aberrancy versus VT.  He was transitioned to Eliquis. On 2.5 mg bid due to age and weight.  Note patient is legally blind.   He had transurethral resection of bladder tumor in May 2023.  Follow-up cytoscope looked good per patient's wife. No further hematuria.     He is living at [facility] with his wife.  He has always been active at the gym.  He was typically doing arm and leg exercises 5 times a week for 20 minutes.  However there is been a change of the last 2 weeks, since around November 1.  He has noticed shortness of breath at a short distance, even with getting dressed.  Wife states PCP did chest x-ray which was unremarkable.  He has occasional lightheadedness, no syncope.  He also gets dizzy with getting up quickly or turning his head quickly.  He has had gradual  lower energy.  They note his hemoglobin recently was 10 down from 14 9 months ago.  Last Friday, he had iron infusion.  He has another iron infusion this Friday.  He is also noticed feeling more unsteady on his feet.   When asked, he notes occasional sharp pain in his left chest as well sometimes a pressure in his lower left chest.  These are not worse with exertion.  Typically notices while sitting in a chair and are brief.  He denies palpitations.  He does not remember having symptoms before his AVR and CABG.  He denies LE edema, PND and orthopnea.  He is wearing his CPAP regularly and is the process of getting a new machine.     Past Medical History:  Diagnosis Date   Abnormal PSA    Adenomatous polyp of colon 2010 and before 2006   Anemia    Anxiety    Aortic stenosis    Aortic valve prosthesis present    Atrial fibrillation (HCC)    CAD (coronary artery disease)    DDD (degenerative disc disease), lumbar    Depression    Diverticulosis    Dyslipidemia    GERD (gastroesophageal reflux disease)    Glaucoma    History of blood transfusion 09/29/2016   History of kidney stones    Hx of CABG    Hypogonadism, male  Macular degeneration    OSA (obstructive sleep apnea)    Osteoarthritis    Osteopenia    Prostate CA ([place]) prostate 2007   colon dx 2018, watching psa levels, no tx yet for prostate    Past Surgical History:  Procedure Laterality Date   AORTIC VALVE REPLACEMENT  October 2011   Magna Ease pericardial tissue valve #51m   BACK SURGERY  1978   lower   CATARACT EXTRACTION Bilateral    CORNEAL TRANSPLANT Right    CORONARY ARTERY BYPASS GRAFT  October 2011   LIMA to LAD   ESOPHAGOGASTRODUODENOSCOPY N/A 09/16/2014   Procedure: ESOPHAGOGASTRODUODENOSCOPY (EGD);  Surgeon: JIrene Shipper MD;  [facility];  Service: Endoscopy;  Laterality: N/A;   TONSILLECTOMY  76 yrs ago   TRANSURETHRAL RESECTION OF BLADDER TUMOR Right 01/04/2022   Procedure: TRANSURETHRAL  RESECTION OF BLADDER TUMOR (TURBT) WITH POST OPERATIVE INSTILLATION OF GEMCITABINE/ POSSIBLE RIGHT STENT PLACEMENT;  Surgeon: WCeasar Mons MD;  Location: [facility];  Service: Urology;  Laterality: Right;    Current Medications: Current Meds  Medication Sig   acetaminophen (TYLENOL) 500 MG tablet Take 1,000 mg by mouth in the morning and at bedtime.   amitriptyline (ELAVIL) 25 MG tablet Take 25 mg by mouth at bedtime.   apixaban (ELIQUIS) 5 MG TABS tablet Take 2.5 mg by mouth 2 (two) times daily.   brimonidine (ALPHAGAN) 0.2 % ophthalmic solution Place 1 drop into both eyes in the morning and at bedtime.   Calcium Carbonate-Vitamin D (CALCIUM-VITAMIN D) 500-200 MG-UNIT per tablet Take 1 tablet by mouth daily.   Cholecalciferol 1000 UNITS capsule Take 1,000 Units by mouth daily.    diclofenac Sodium (VOLTAREN) 1 % GEL Apply 1 application. topically in the morning and at bedtime.   dorzolamide-timolol (COSOPT) 22.3-6.8 MG/ML ophthalmic solution Place 1 drop into both eyes 2 (two) times daily.   fluticasone (FLONASE) 50 MCG/ACT nasal spray Place 1 spray into both nostrils daily as needed for allergies or rhinitis.    gabapentin (NEURONTIN) 300 MG capsule Take 300 mg by mouth 4 (four) times daily.   ipratropium (ATROVENT) 0.06 % nasal spray Place 2 sprays into both nostrils in the morning and at bedtime.   latanoprost (XALATAN) 0.005 % ophthalmic solution Place 1 drop into both eyes daily.   metoprolol tartrate (LOPRESSOR) 25 MG tablet Take 1 tablet (25 mg total) by mouth 2 (two) times daily. (Patient taking differently: Take 12.5 mg by mouth 2 (two) times daily.)   Multiple Vitamin (MULTIVITAMIN WITH MINERALS) TABS tablet Take 1 tablet by mouth daily.   pantoprazole (PROTONIX) 40 MG tablet Take 40 mg by mouth daily.   perphenazine (TRILAFON) 2 MG tablet Take 2 mg by mouth 3 (three) times a week.   prednisoLONE acetate (PRED FORTE) 1 % ophthalmic suspension Place 1 drop into the right eye  daily.   psyllium (METAMUCIL) 58.6 % powder Take 1 packet by mouth daily.   rosuvastatin (CRESTOR) 5 MG tablet Take 5 mg by mouth every Monday, Wednesday, and Friday.   tamsulosin (FLOMAX) 0.4 MG CAPS capsule Take 0.4 mg by mouth daily.   vitamin B-12 (CYANOCOBALAMIN) 1000 MCG tablet Take 1,000 mcg by mouth daily.   [DISCONTINUED] oxybutynin (DITROPAN) 5 MG tablet Take 1 tablet (5 mg total) by mouth every 8 (eight) hours as needed for bladder spasms.   [DISCONTINUED] phenazopyridine (PYRIDIUM) 200 MG tablet Take 1 tablet (200 mg total) by mouth 3 (three) times daily as needed (for pain with urination).  Allergies:   Xarelto [rivaroxaban]   Social History   Socioeconomic History   Marital status: Married    Spouse name: Izora Gala   Number of children: 2   Years of education: Not on file   Highest education level: Not on file  Occupational History   Not on file  Tobacco Use   Smoking status: Former    Packs/day: 1.00    Years: 40.00    Total pack years: 40.00    Types: Cigarettes    Quit date: 11/16/1990    Years since quitting: 31.6   Smokeless tobacco: Never  Vaping Use   Vaping Use: Never used  Substance and Sexual Activity   Alcohol use: Yes    Alcohol/week: 2.0 standard drinks of alcohol    Types: 2 Glasses of wine per week    Comment: wine few x per week   Drug use: No   Sexual activity: Not on file  Other Topics Concern   Not on file  Social History Narrative   Right handed   Caffeine use: coffee/tea (2-3 cups coffee per day, 1 tea per day)   Lives with wife   Social Determinants of Health   Financial Resource Strain: Not on file  Food Insecurity: Not on file  Transportation Needs: Not on file  Physical Activity: Not on file  Stress: Not on file  Social Connections: Not on file     Family History: The patient's family history includes Heart attack in his father. There is no history of Colon cancer, Esophageal cancer, Stomach cancer, or Rectal  cancer.  ROS:   Please see the history of present illness.     EKGs/Labs/Other Studies Reviewed:    EKG:  The ekg ordered today demonstrates atrial fibrillation, left axis deviation, heart rate 93, QRS duration 124 ms.  Recent Labs: 12/28/2021: BUN 14; Creatinine, Ser 0.98; Hemoglobin 13.4; Platelets 164; Potassium 3.8; Sodium 138   Recent Lipid Panel Lab Results  Component Value Date/Time   CHOL 89 01/03/2012 08:35 AM   TRIG 125.0 01/03/2012 08:35 AM   HDL 37.40 (L) 01/03/2012 08:35 AM   LDLCALC 27 01/03/2012 08:35 AM    Physical Exam:    VS:  BP 136/78 (BP Location: Left Arm, Patient Position: Sitting)   Pulse 93   Ht '5\' 10"'$  (1.778 m)   Wt 170 lb (77.1 kg)   SpO2 94%   BMI 24.39 kg/m    No data found.       Wt Readings from Last 3 Encounters:  07/12/22 170 lb (77.1 kg)  07/07/22 168 lb (76.2 kg)  06/28/22 167 lb 12.8 oz (76.1 kg)     GEN:  Well nourished, well developed in no acute distress HEENT: Normal NECK: No JVD; No carotid bruits CARDIAC: Irregular irregular, no murmurs, rubs, gallops RESPIRATORY:  Clear to auscultation without rales, wheezing or rhonchi  ABDOMEN: Soft, non-tender, non-distended MUSCULOSKELETAL: No edema SKIN: Warm and dry NEUROLOGIC:  Alert and oriented PSYCHIATRIC:  Normal affect     ASSESSMENT AND PLAN   Dyspnea on exertion -Seems out of proportion to his mild anemia, currently getting iron infusions -Order 2D echo -We discussed Lexiscan stress test versus going straight to [facility].  We will start with Lexiscan stress to evaluate ischemic burden.    Permanent atrial fibrillation, rate controlled -Asymptomatic -EKG today shows atrial fibrillation with heart rate 93 -Continue metoprolol tartrate 12.5 mg twice daily -Continue Eliquis 2.5 mg twice daily, lower dose given age and history of bleeding  CAD s/p CABG -Denies exertional chest pain, but has occasional chest pain at rest  -Dyspnea exertion could be anginal equivalent  --order Lexiscan stress test to rule out ischemia -Not on aspirin given need for anticoagulation -Continue statin therapy and metoprolol. -At follow-up, may consider increasing metoprolol or adding Ranexa to maximize anginal therapy.  With history of borderline blood pressure and lightheadedness, weary to add Imdur.  History of AVR  -AVR in 2011 -Last 2D echo in 2015 with bioprosthesis functioning normally, EF 55 to 60% -No murmur heard on exam -No heart failure symptoms -Repeat 2D echo  Hyperlipidemia -LDL 33 in July 2023.  Continue Crestor 5 mg 3 days a week.  Disposition - Follow-up in 1 month to review testing and re-evaluate symptoms.   Medication Adjustments/Labs and Tests Ordered: Current medicines are reviewed at length with the patient today.  Concerns regarding medicines are outlined above.  Orders Placed This Encounter  Procedures   MYOCARDIAL PERFUSION IMAGING   EKG 12-Lead   ECHOCARDIOGRAM COMPLETE   No orders of the defined types were placed in this encounter.   Patient Instructions  Medication Instructions:  No Changes *If you need a refill on your cardiac medications before your next appointment, please call your pharmacy*   Lab Work: No Labs If you have labs (blood work) drawn today and your tests are completely normal, you will receive your results only by: [place] (if you have MyChart) OR A paper copy in the mail If you have any lab test that is abnormal or we need to change your treatment, we will call you to review the results.   Testing/Procedures: [address]. Your physician has requested that you have an echocardiogram. Echocardiography is a painless test that uses sound waves to create images of your heart. It provides your doctor with information about the size and shape of your heart and how well your heart's chambers and valves are working. This procedure takes approximately one hour. There are no restrictions for  this procedure. Please do NOT wear cologne, perfume, aftershave, or lotions (deodorant is allowed). Please arrive 15 minutes prior to your appointment time.    [address], [address]. Your physician has requested that you have a lexiscan myoview. For further information please visit [email or web address]. Please follow instruction sheet, as given.   Follow-Up: At [facility], you and your health needs are our priority.  As part of our continuing mission to provide you with exceptional heart care, we have created designated Provider Care Teams.  These Care Teams include your primary Cardiologist (physician) and Advanced Practice Providers (APPs -  Physician Assistants and Nurse Practitioners) who all work together to provide you with the care you need, when you need it.  We recommend signing up for the patient portal called "MyChart".  Sign up information is provided on this After Visit Summary.  MyChart is used to connect with patients for Virtual Visits (Telemedicine).  Patients are able to view lab/test results, encounter notes, upcoming appointments, etc.  Non-urgent messages can be sent to your provider as well.   To learn more about what you can do with MyChart, go to [email or web address].    Your next appointment:   1 month(s)  The format for your next appointment:   In Person  Provider:   Caron Presume, Hershal Coria    or, Peter Martinique, MD    Signed, Warren Lacy, PA-C  07/12/2022 10:15 AM    [facility] 

## 2022-07-12 NOTE — Telephone Encounter (Signed)
HST- Medicare/bcbs supp no auth req.  Patient is scheduled at [facility] For 08/01/22 at 8:30 AM.  Mailed packet to the patient.

## 2022-07-12 NOTE — Patient Instructions (Signed)
Medication Instructions:  No Changes *If you need a refill on your cardiac medications before your next appointment, please call your pharmacy*   Lab Work: No Labs If you have labs (blood work) drawn today and your tests are completely normal, you will receive your results only by: [place] (if you have MyChart) OR A paper copy in the mail If you have any lab test that is abnormal or we need to change your treatment, we will call you to review the results.   Testing/Procedures: [address]. Your physician has requested that you have an echocardiogram. Echocardiography is a painless test that uses sound waves to create images of your heart. It provides your doctor with information about the size and shape of your heart and how well your heart's chambers and valves are working. This procedure takes approximately one hour. There are no restrictions for this procedure. Please do NOT wear cologne, perfume, aftershave, or lotions (deodorant is allowed). Please arrive 15 minutes prior to your appointment time.    [address]. Your physician has requested that you have a lexiscan myoview. For further information please visit [email or web address]. Please follow instruction sheet, as given.   Follow-Up: At [facility], you and your health needs are our priority.  As part of our continuing mission to provide you with exceptional heart care, we have created designated Provider Care Teams.  These Care Teams include your primary Cardiologist (physician) and Advanced Practice Providers (APPs -  Physician Assistants and Nurse Practitioners) who all work together to provide you with the care you need, when you need it.  We recommend signing up for the patient portal called "MyChart".  Sign up information is provided on this After Visit Summary.  MyChart is used to connect with patients for Virtual Visits (Telemedicine).  Patients are able to  view lab/test results, encounter notes, upcoming appointments, etc.  Non-urgent messages can be sent to your provider as well.   To learn more about what you can do with MyChart, go to [email or web address].    Your next appointment:   1 month(s)  The format for your next appointment:   In Person  Provider:   Caron Presume, Hershal Coria    or, Peter Martinique, MD

## 2022-07-14 ENCOUNTER — Encounter (HOSPITAL_COMMUNITY)
Admission: RE | Admit: 2022-07-14 | Discharge: 2022-07-14 | Disposition: A | Discharge status: Home / Self Care | Payer: Medicare Other | Source: Ambulatory Visit | Attending: Internal Medicine | Admitting: Internal Medicine

## 2022-07-14 DIAGNOSIS — Z952 Presence of prosthetic heart valve: Secondary | ICD-10-CM | POA: Diagnosis not present

## 2022-07-14 DIAGNOSIS — R072 Precordial pain: Secondary | ICD-10-CM | POA: Diagnosis not present

## 2022-07-14 DIAGNOSIS — I4821 Permanent atrial fibrillation: Secondary | ICD-10-CM | POA: Diagnosis not present

## 2022-07-14 DIAGNOSIS — I25810 Atherosclerosis of coronary artery bypass graft(s) without angina pectoris: Secondary | ICD-10-CM | POA: Diagnosis not present

## 2022-07-14 DIAGNOSIS — R0609 Other forms of dyspnea: Secondary | ICD-10-CM | POA: Diagnosis not present

## 2022-07-14 MED ORDER — SODIUM CHLORIDE 0.9 % IV SOLN
510.0000 mg | INTRAVENOUS | Status: DC
  Administered 2022-07-14: 510 mg via INTRAVENOUS
  Filled 2022-07-14: qty 17

## 2022-07-19 NOTE — Addendum Note (Signed)
Addended by: Caron Presume on: 07/19/2022 04:04 PM   Modules accepted: Orders

## 2022-08-01 ENCOUNTER — Ambulatory Visit (INDEPENDENT_AMBULATORY_CARE_PROVIDER_SITE_OTHER): Payer: Medicare Other | Admitting: Neurology

## 2022-08-01 DIAGNOSIS — H548 Legal blindness, as defined in USA: Secondary | ICD-10-CM

## 2022-08-01 DIAGNOSIS — I480 Paroxysmal atrial fibrillation: Secondary | ICD-10-CM

## 2022-08-01 DIAGNOSIS — G4734 Idiopathic sleep related nonobstructive alveolar hypoventilation: Secondary | ICD-10-CM

## 2022-08-01 DIAGNOSIS — R490 Dysphonia: Secondary | ICD-10-CM

## 2022-08-01 DIAGNOSIS — H903 Sensorineural hearing loss, bilateral: Secondary | ICD-10-CM

## 2022-08-01 DIAGNOSIS — G4733 Obstructive sleep apnea (adult) (pediatric): Secondary | ICD-10-CM | POA: Diagnosis not present

## 2022-08-02 DIAGNOSIS — M79645 Pain in left finger(s): Secondary | ICD-10-CM | POA: Diagnosis not present

## 2022-08-02 DIAGNOSIS — M1812 Unilateral primary osteoarthritis of first carpometacarpal joint, left hand: Secondary | ICD-10-CM | POA: Diagnosis not present

## 2022-08-02 DIAGNOSIS — D500 Iron deficiency anemia secondary to blood loss (chronic): Secondary | ICD-10-CM | POA: Diagnosis not present

## 2022-08-02 DIAGNOSIS — G4733 Obstructive sleep apnea (adult) (pediatric): Secondary | ICD-10-CM | POA: Diagnosis not present

## 2022-08-02 DIAGNOSIS — I480 Paroxysmal atrial fibrillation: Secondary | ICD-10-CM | POA: Diagnosis not present

## 2022-08-02 DIAGNOSIS — R0602 Shortness of breath: Secondary | ICD-10-CM | POA: Diagnosis not present

## 2022-08-03 ENCOUNTER — Telehealth: Payer: Self-pay | Admitting: *Deleted

## 2022-08-03 NOTE — Progress Notes (Signed)
See procedure note.

## 2022-08-03 NOTE — Telephone Encounter (Signed)
Per DPR spoke to Pt's wife Izora Gala and gave instructions for MPI study scheduled on 08/10/22.

## 2022-08-04 ENCOUNTER — Telehealth: Payer: Self-pay | Admitting: Neurology

## 2022-08-04 NOTE — Procedures (Signed)
[facility]  HOME SLEEP TEST (Watch PAT) REPORT  STUDY DATE: 08/01/2022  DOB: Sep 18, 1925  MRN: [id number]  ORDERING CLINICIAN: Star Age, MD, PhD -study interpreted on behalf of Dr. Brett Fairy   REFERRING CLINICIAN: [name], MD (PCP), Dr. Brett Fairy (Sleep)  CLINICAL INFORMATION/HISTORY: 88 year old male with an underlying complex medical history of chronic atrial fibrillation, coronary artery disease with status post CABG, status post AVR, prostate cancer, hyperlipidemia, and prior diagnosis of sleep apnea, who presents for reevaluation of his OSA.  He has been compliant with his CPAP of 11 cm with mild residual sleep disordered breathing noted and high leak from the mask.   Epworth sleepiness score: 10/24.  BMI: 24 kg/m  FINDINGS:   Sleep Summary:   Total Recording Time (hours, min): 9 hours, 23 min  Total Sleep Time (hours, min):  8 hours, 21 min  Percent REM (%):    17.2%   Respiratory Indices:   Calculated pAHI (per hour):  45.8/hour         REM pAHI:    51.9/hour       NREM pAHI: 44.6/hour  Central pAHI: 4.5/hour  Oxygen Saturation Statistics:    Oxygen Saturation (%) Mean: 93%   Minimum oxygen saturation (%):                 75%   O2 Saturation Range (%): 75 - 99%    O2 Saturation (minutes) <=88%: 32.6 min  Pulse Rate Statistics:   Pulse Mean (bpm):    82/min    Pulse Range (62-122/min)   IMPRESSION: OSA (obstructive sleep apnea), severe Nocturnal Hypoxemia  RECOMMENDATION:  This home sleep test demonstrates severe obstructive sleep apnea with a total AHI of 45.8/hour and O2 nadir of 75% with significant time below or at 88% saturation of over 30 minutes for the night, indicating nocturnal hypoxemia.  Snoring was detected, in the mild to moderate range for the most part, at times louder.  Ongoing treatment with positive airway pressure is highly recommended.  The patient should be eligible for a new CPAP machine, I will write  for a new machine. A laboratory attended titration study can be considered in the future for optimization of treatment settings and to improved tolerance and compliance. Alternative treatment options are limited secondary to the severity of the patient's sleep disordered breathing, but may include surgical treatment with an implantable hypoglossal nerve stimulator (in carefully selected candidates, meeting criteria).  Concomitant weight loss is recommended (where clinically appropriate). Please note, that untreated obstructive sleep apnea may carry additional perioperative morbidity. Patients with significant obstructive sleep apnea should receive perioperative PAP therapy and the surgeons and particularly the anesthesiologist should be informed of the diagnosis and the severity of the sleep disordered breathing. The patient should be cautioned not to drive, work at heights, or operate dangerous or heavy equipment when tired or sleepy. Review and reiteration of good sleep hygiene measures should be pursued with any patient. Other causes of the patient's symptoms, including circadian rhythm disturbances, an underlying mood disorder, medication effect and/or an underlying medical problem cannot be ruled out based on this test. Clinical correlation is recommended.  The patient and his referring provider will be notified of the test results. The patient will be seen in follow up in sleep clinic at [facility].  I certify that I have reviewed the raw data recording prior to the issuance of this report in accordance with the standards of the American Academy of Sleep Medicine (AASM).  INTERPRETING  PHYSICIAN:   Star Age, MD, PhD Medical Director, [facility]) [place], ABPN (Neurology and Sleep)   [facility] [address] [place], [place] [address] [phone]

## 2022-08-04 NOTE — Telephone Encounter (Signed)
This patient saw Dr. Brett Fairy for sleep evaluation on 06/28/2022.  I read the home sleep test from 08/01/2022 on Dr. Edwena Felty behalf:  Patient has a history of sleep apnea and uses a CPAP machine, I would like to write for a new machine as he should be eligible, order in the chart.  Home sleep test confirms severe sleep apnea.  Please notify patient and process order.  He will need to follow-up in sleep clinic to see Dr. Brett Fairy or one of our nurse practitioners within 1 to 3 months of set up with the new equipment.  Please encourage ongoing full compliance.

## 2022-08-07 ENCOUNTER — Ambulatory Visit (HOSPITAL_COMMUNITY)
Admission: RE | Admit: 2022-08-07 | Discharge: 2022-08-07 | Disposition: A | Discharge status: Home / Self Care | Payer: Medicare Other | Source: Ambulatory Visit | Attending: Adult Health | Admitting: Adult Health

## 2022-08-07 ENCOUNTER — Ambulatory Visit (HOSPITAL_COMMUNITY): Admission: RE | Admit: 2022-08-07 | Payer: Medicare Other | Source: Ambulatory Visit

## 2022-08-07 ENCOUNTER — Other Ambulatory Visit (HOSPITAL_COMMUNITY): Payer: Self-pay | Admitting: Adult Health

## 2022-08-07 DIAGNOSIS — M51369 Other intervertebral disc degeneration, lumbar region without mention of lumbar back pain or lower extremity pain: Secondary | ICD-10-CM

## 2022-08-07 DIAGNOSIS — M545 Low back pain, unspecified: Secondary | ICD-10-CM | POA: Insufficient documentation

## 2022-08-07 DIAGNOSIS — M48061 Spinal stenosis, lumbar region without neurogenic claudication: Secondary | ICD-10-CM | POA: Diagnosis not present

## 2022-08-07 DIAGNOSIS — C61 Malignant neoplasm of prostate: Secondary | ICD-10-CM | POA: Insufficient documentation

## 2022-08-07 DIAGNOSIS — F329 Major depressive disorder, single episode, unspecified: Secondary | ICD-10-CM | POA: Diagnosis not present

## 2022-08-07 DIAGNOSIS — M47816 Spondylosis without myelopathy or radiculopathy, lumbar region: Secondary | ICD-10-CM | POA: Diagnosis not present

## 2022-08-07 DIAGNOSIS — M5136 Other intervertebral disc degeneration, lumbar region: Secondary | ICD-10-CM | POA: Insufficient documentation

## 2022-08-07 DIAGNOSIS — M5126 Other intervertebral disc displacement, lumbar region: Secondary | ICD-10-CM | POA: Diagnosis not present

## 2022-08-07 DIAGNOSIS — K219 Gastro-esophageal reflux disease without esophagitis: Secondary | ICD-10-CM | POA: Diagnosis not present

## 2022-08-07 DIAGNOSIS — M546 Pain in thoracic spine: Secondary | ICD-10-CM | POA: Diagnosis not present

## 2022-08-07 DIAGNOSIS — C679 Malignant neoplasm of bladder, unspecified: Secondary | ICD-10-CM | POA: Diagnosis not present

## 2022-08-07 NOTE — Telephone Encounter (Signed)
LVM for pt to call about results. °

## 2022-08-09 NOTE — Telephone Encounter (Signed)
Called [phone]. Spoke w/ wife. I advised pt that Dr. Rexene Alberts reviewed their sleep study results since Dr. Brett Fairy out and found that pt has severe sleep apnea and eligible for new machine. Dr. Rexene Alberts recommends that pt start CPAP. I reviewed PAP compliance expectations with the pt. Pt is agreeable to starting a CPAP. I advised pt that an order will be sent to a DME, [facility], and [employer] will call the pt within about one week after they file with the pt's insurance. [facility] will show the pt how to use the machine, fit for masks, and troubleshoot the CPAP if needed. They will call [employer] if they do not hear from them in the next 1-2 weeks.  A follow up appt was made for insurance purposes with Dr. Brett Fairy 11/08/22 at 9:30am.   Community message sent to [facility] that order placed.

## 2022-08-10 ENCOUNTER — Encounter (HOSPITAL_COMMUNITY): Payer: Medicare Other

## 2022-08-10 ENCOUNTER — Other Ambulatory Visit (HOSPITAL_COMMUNITY): Payer: Medicare Other

## 2022-08-15 ENCOUNTER — Ambulatory Visit: Payer: Medicare Other | Admitting: Cardiology

## 2022-08-16 ENCOUNTER — Telehealth: Payer: Self-pay | Admitting: Cardiology

## 2022-08-16 NOTE — Telephone Encounter (Signed)
Jason [name],  Are you able to provide him echo instructions?   Thanks

## 2022-08-16 NOTE — Telephone Encounter (Signed)
Patient's wife is calling to get echo instructions for patient.

## 2022-08-18 ENCOUNTER — Ambulatory Visit (HOSPITAL_COMMUNITY): Payer: Medicare Other | Attending: Physician Assistant

## 2022-08-18 DIAGNOSIS — I25810 Atherosclerosis of coronary artery bypass graft(s) without angina pectoris: Secondary | ICD-10-CM | POA: Insufficient documentation

## 2022-08-18 DIAGNOSIS — Z952 Presence of prosthetic heart valve: Secondary | ICD-10-CM | POA: Diagnosis not present

## 2022-08-18 LAB — ECHOCARDIOGRAM COMPLETE
AV Mean grad: 12.4 mmHg
AV Peak grad: 21.5 mmHg
Ao pk vel: 2.32 m/s
Calc EF: 61.8 %
S' Lateral: 3 cm
Single Plane A2C EF: 63.4 %
Single Plane A4C EF: 59.4 %

## 2022-08-23 ENCOUNTER — Telehealth: Payer: Self-pay | Admitting: Cardiology

## 2022-08-23 NOTE — Telephone Encounter (Signed)
Wife stated that patient has a cough, no fever and wants to cancel lexiscan scheduled for tomorrow. She wants to reschedule the procedure.

## 2022-08-23 NOTE — Telephone Encounter (Signed)
Pt's wife would like a call back to discuss cough patient has developed and if they should keep appt for test tomorrow 12/28. Please advise.

## 2022-08-24 ENCOUNTER — Encounter (HOSPITAL_COMMUNITY): Payer: Medicare Other

## 2022-08-29 ENCOUNTER — Encounter (HOSPITAL_BASED_OUTPATIENT_CLINIC_OR_DEPARTMENT_OTHER): Payer: Self-pay

## 2022-08-29 ENCOUNTER — Emergency Department (HOSPITAL_BASED_OUTPATIENT_CLINIC_OR_DEPARTMENT_OTHER): Payer: Medicare Other | Admitting: Radiology

## 2022-08-29 ENCOUNTER — Encounter (HOSPITAL_COMMUNITY): Payer: Self-pay

## 2022-08-29 ENCOUNTER — Other Ambulatory Visit: Payer: Self-pay

## 2022-08-29 ENCOUNTER — Inpatient Hospital Stay (HOSPITAL_BASED_OUTPATIENT_CLINIC_OR_DEPARTMENT_OTHER)
Admission: EM | Admit: 2022-08-29 | Discharge: 2022-09-15 | DRG: 377 | Disposition: A | Discharge status: Skilled Nursing Facility | Payer: Medicare Other | Source: Other Acute Inpatient Hospital | Attending: Family Medicine | Admitting: Family Medicine

## 2022-08-29 ENCOUNTER — Encounter: Payer: Self-pay | Admitting: Hematology

## 2022-08-29 DIAGNOSIS — K6289 Other specified diseases of anus and rectum: Secondary | ICD-10-CM | POA: Diagnosis not present

## 2022-08-29 DIAGNOSIS — Z79899 Other long term (current) drug therapy: Secondary | ICD-10-CM

## 2022-08-29 DIAGNOSIS — K5730 Diverticulosis of large intestine without perforation or abscess without bleeding: Secondary | ICD-10-CM | POA: Diagnosis present

## 2022-08-29 DIAGNOSIS — I330 Acute and subacute infective endocarditis: Secondary | ICD-10-CM | POA: Diagnosis not present

## 2022-08-29 DIAGNOSIS — K317 Polyp of stomach and duodenum: Secondary | ICD-10-CM | POA: Diagnosis present

## 2022-08-29 DIAGNOSIS — G002 Streptococcal meningitis: Secondary | ICD-10-CM | POA: Diagnosis present

## 2022-08-29 DIAGNOSIS — K259 Gastric ulcer, unspecified as acute or chronic, without hemorrhage or perforation: Secondary | ICD-10-CM | POA: Diagnosis not present

## 2022-08-29 DIAGNOSIS — Z9049 Acquired absence of other specified parts of digestive tract: Secondary | ICD-10-CM

## 2022-08-29 DIAGNOSIS — D62 Acute posthemorrhagic anemia: Secondary | ICD-10-CM | POA: Diagnosis present

## 2022-08-29 DIAGNOSIS — M4620 Osteomyelitis of vertebra, site unspecified: Secondary | ICD-10-CM | POA: Diagnosis not present

## 2022-08-29 DIAGNOSIS — Z515 Encounter for palliative care: Secondary | ICD-10-CM

## 2022-08-29 DIAGNOSIS — K5520 Angiodysplasia of colon without hemorrhage: Secondary | ICD-10-CM

## 2022-08-29 DIAGNOSIS — H548 Legal blindness, as defined in USA: Secondary | ICD-10-CM | POA: Diagnosis present

## 2022-08-29 DIAGNOSIS — F418 Other specified anxiety disorders: Secondary | ICD-10-CM | POA: Diagnosis not present

## 2022-08-29 DIAGNOSIS — I9589 Other hypotension: Secondary | ICD-10-CM | POA: Diagnosis not present

## 2022-08-29 DIAGNOSIS — Z8639 Personal history of other endocrine, nutritional and metabolic disease: Secondary | ICD-10-CM

## 2022-08-29 DIAGNOSIS — I38 Endocarditis, valve unspecified: Secondary | ICD-10-CM | POA: Diagnosis not present

## 2022-08-29 DIAGNOSIS — K31819 Angiodysplasia of stomach and duodenum without bleeding: Secondary | ICD-10-CM

## 2022-08-29 DIAGNOSIS — Y831 Surgical operation with implant of artificial internal device as the cause of abnormal reaction of the patient, or of later complication, without mention of misadventure at the time of the procedure: Secondary | ICD-10-CM | POA: Diagnosis present

## 2022-08-29 DIAGNOSIS — D649 Anemia, unspecified: Secondary | ICD-10-CM | POA: Diagnosis not present

## 2022-08-29 DIAGNOSIS — M4647 Discitis, unspecified, lumbosacral region: Secondary | ICD-10-CM | POA: Diagnosis not present

## 2022-08-29 DIAGNOSIS — K254 Chronic or unspecified gastric ulcer with hemorrhage: Secondary | ICD-10-CM | POA: Diagnosis present

## 2022-08-29 DIAGNOSIS — M545 Low back pain, unspecified: Secondary | ICD-10-CM | POA: Diagnosis not present

## 2022-08-29 DIAGNOSIS — Z85038 Personal history of other malignant neoplasm of large intestine: Secondary | ICD-10-CM

## 2022-08-29 DIAGNOSIS — T826XXA Infection and inflammatory reaction due to cardiac valve prosthesis, initial encounter: Secondary | ICD-10-CM | POA: Diagnosis present

## 2022-08-29 DIAGNOSIS — J9811 Atelectasis: Secondary | ICD-10-CM | POA: Diagnosis present

## 2022-08-29 DIAGNOSIS — K648 Other hemorrhoids: Secondary | ICD-10-CM | POA: Diagnosis not present

## 2022-08-29 DIAGNOSIS — K2900 Acute gastritis without bleeding: Secondary | ICD-10-CM | POA: Diagnosis not present

## 2022-08-29 DIAGNOSIS — Z6821 Body mass index (BMI) 21.0-21.9, adult: Secondary | ICD-10-CM

## 2022-08-29 DIAGNOSIS — K3189 Other diseases of stomach and duodenum: Secondary | ICD-10-CM | POA: Diagnosis not present

## 2022-08-29 DIAGNOSIS — K6389 Other specified diseases of intestine: Secondary | ICD-10-CM | POA: Diagnosis not present

## 2022-08-29 DIAGNOSIS — K5521 Angiodysplasia of colon with hemorrhage: Secondary | ICD-10-CM | POA: Diagnosis present

## 2022-08-29 DIAGNOSIS — I2489 Other forms of acute ischemic heart disease: Secondary | ICD-10-CM | POA: Diagnosis present

## 2022-08-29 DIAGNOSIS — H3530 Unspecified macular degeneration: Secondary | ICD-10-CM | POA: Diagnosis present

## 2022-08-29 DIAGNOSIS — Z952 Presence of prosthetic heart valve: Secondary | ICD-10-CM | POA: Diagnosis not present

## 2022-08-29 DIAGNOSIS — A491 Streptococcal infection, unspecified site: Secondary | ICD-10-CM | POA: Diagnosis present

## 2022-08-29 DIAGNOSIS — K222 Esophageal obstruction: Secondary | ICD-10-CM | POA: Diagnosis not present

## 2022-08-29 DIAGNOSIS — G4733 Obstructive sleep apnea (adult) (pediatric): Secondary | ICD-10-CM | POA: Diagnosis not present

## 2022-08-29 DIAGNOSIS — R140 Abdominal distension (gaseous): Secondary | ICD-10-CM | POA: Diagnosis not present

## 2022-08-29 DIAGNOSIS — C785 Secondary malignant neoplasm of large intestine and rectum: Secondary | ICD-10-CM | POA: Diagnosis present

## 2022-08-29 DIAGNOSIS — E861 Hypovolemia: Secondary | ICD-10-CM | POA: Diagnosis present

## 2022-08-29 DIAGNOSIS — Z8546 Personal history of malignant neoplasm of prostate: Secondary | ICD-10-CM

## 2022-08-29 DIAGNOSIS — K56699 Other intestinal obstruction unspecified as to partial versus complete obstruction: Secondary | ICD-10-CM | POA: Diagnosis not present

## 2022-08-29 DIAGNOSIS — Z5309 Procedure and treatment not carried out because of other contraindication: Secondary | ICD-10-CM | POA: Diagnosis present

## 2022-08-29 DIAGNOSIS — K31811 Angiodysplasia of stomach and duodenum with bleeding: Secondary | ICD-10-CM | POA: Diagnosis not present

## 2022-08-29 DIAGNOSIS — J3489 Other specified disorders of nose and nasal sinuses: Secondary | ICD-10-CM | POA: Diagnosis not present

## 2022-08-29 DIAGNOSIS — L89102 Pressure ulcer of unspecified part of back, stage 2: Secondary | ICD-10-CM | POA: Diagnosis present

## 2022-08-29 DIAGNOSIS — K449 Diaphragmatic hernia without obstruction or gangrene: Secondary | ICD-10-CM | POA: Diagnosis present

## 2022-08-29 DIAGNOSIS — G8929 Other chronic pain: Secondary | ICD-10-CM | POA: Diagnosis present

## 2022-08-29 DIAGNOSIS — Z66 Do not resuscitate: Secondary | ICD-10-CM | POA: Diagnosis present

## 2022-08-29 DIAGNOSIS — M4646 Discitis, unspecified, lumbar region: Secondary | ICD-10-CM | POA: Diagnosis present

## 2022-08-29 DIAGNOSIS — M4626 Osteomyelitis of vertebra, lumbar region: Secondary | ICD-10-CM | POA: Diagnosis present

## 2022-08-29 DIAGNOSIS — I4821 Permanent atrial fibrillation: Secondary | ICD-10-CM | POA: Diagnosis present

## 2022-08-29 DIAGNOSIS — M4640 Discitis, unspecified, site unspecified: Secondary | ICD-10-CM | POA: Diagnosis present

## 2022-08-29 DIAGNOSIS — I2510 Atherosclerotic heart disease of native coronary artery without angina pectoris: Secondary | ICD-10-CM | POA: Diagnosis present

## 2022-08-29 DIAGNOSIS — Z87891 Personal history of nicotine dependence: Secondary | ICD-10-CM | POA: Diagnosis not present

## 2022-08-29 DIAGNOSIS — K644 Residual hemorrhoidal skin tags: Secondary | ICD-10-CM | POA: Diagnosis present

## 2022-08-29 DIAGNOSIS — Z888 Allergy status to other drugs, medicaments and biological substances status: Secondary | ICD-10-CM

## 2022-08-29 DIAGNOSIS — Z8551 Personal history of malignant neoplasm of bladder: Secondary | ICD-10-CM

## 2022-08-29 DIAGNOSIS — I482 Chronic atrial fibrillation, unspecified: Secondary | ICD-10-CM | POA: Diagnosis not present

## 2022-08-29 DIAGNOSIS — I959 Hypotension, unspecified: Secondary | ICD-10-CM

## 2022-08-29 DIAGNOSIS — H409 Unspecified glaucoma: Secondary | ICD-10-CM | POA: Diagnosis present

## 2022-08-29 DIAGNOSIS — R7881 Bacteremia: Secondary | ICD-10-CM | POA: Diagnosis present

## 2022-08-29 DIAGNOSIS — R0602 Shortness of breath: Secondary | ICD-10-CM | POA: Diagnosis not present

## 2022-08-29 DIAGNOSIS — D509 Iron deficiency anemia, unspecified: Secondary | ICD-10-CM | POA: Diagnosis not present

## 2022-08-29 DIAGNOSIS — K922 Gastrointestinal hemorrhage, unspecified: Secondary | ICD-10-CM | POA: Diagnosis not present

## 2022-08-29 DIAGNOSIS — J90 Pleural effusion, not elsewhere classified: Secondary | ICD-10-CM | POA: Diagnosis not present

## 2022-08-29 DIAGNOSIS — K219 Gastro-esophageal reflux disease without esophagitis: Secondary | ICD-10-CM | POA: Diagnosis present

## 2022-08-29 DIAGNOSIS — E440 Moderate protein-calorie malnutrition: Secondary | ICD-10-CM | POA: Diagnosis present

## 2022-08-29 DIAGNOSIS — M8580 Other specified disorders of bone density and structure, unspecified site: Secondary | ICD-10-CM | POA: Diagnosis present

## 2022-08-29 DIAGNOSIS — B954 Other streptococcus as the cause of diseases classified elsewhere: Secondary | ICD-10-CM | POA: Diagnosis present

## 2022-08-29 DIAGNOSIS — I088 Other rheumatic multiple valve diseases: Secondary | ICD-10-CM | POA: Diagnosis not present

## 2022-08-29 DIAGNOSIS — G4730 Sleep apnea, unspecified: Secondary | ICD-10-CM | POA: Diagnosis not present

## 2022-08-29 DIAGNOSIS — Z947 Corneal transplant status: Secondary | ICD-10-CM

## 2022-08-29 DIAGNOSIS — M48061 Spinal stenosis, lumbar region without neurogenic claudication: Secondary | ICD-10-CM | POA: Diagnosis present

## 2022-08-29 DIAGNOSIS — T18198A Other foreign object in esophagus causing other injury, initial encounter: Secondary | ICD-10-CM | POA: Diagnosis not present

## 2022-08-29 DIAGNOSIS — Z7901 Long term (current) use of anticoagulants: Secondary | ICD-10-CM

## 2022-08-29 DIAGNOSIS — Z7401 Bed confinement status: Secondary | ICD-10-CM | POA: Diagnosis not present

## 2022-08-29 DIAGNOSIS — Z8249 Family history of ischemic heart disease and other diseases of the circulatory system: Secondary | ICD-10-CM

## 2022-08-29 DIAGNOSIS — C189 Malignant neoplasm of colon, unspecified: Secondary | ICD-10-CM | POA: Diagnosis not present

## 2022-08-29 DIAGNOSIS — R059 Cough, unspecified: Secondary | ICD-10-CM | POA: Diagnosis not present

## 2022-08-29 DIAGNOSIS — E785 Hyperlipidemia, unspecified: Secondary | ICD-10-CM | POA: Diagnosis present

## 2022-08-29 DIAGNOSIS — R58 Hemorrhage, not elsewhere classified: Secondary | ICD-10-CM | POA: Diagnosis not present

## 2022-08-29 DIAGNOSIS — R195 Other fecal abnormalities: Secondary | ICD-10-CM

## 2022-08-29 DIAGNOSIS — R531 Weakness: Secondary | ICD-10-CM | POA: Diagnosis not present

## 2022-08-29 DIAGNOSIS — Z951 Presence of aortocoronary bypass graft: Secondary | ICD-10-CM

## 2022-08-29 DIAGNOSIS — J811 Chronic pulmonary edema: Secondary | ICD-10-CM | POA: Diagnosis present

## 2022-08-29 LAB — BASIC METABOLIC PANEL
Anion gap: 10 (ref 5–15)
BUN: 13 mg/dL (ref 8–23)
CO2: 25 mmol/L (ref 22–32)
Calcium: 8.6 mg/dL — ABNORMAL LOW (ref 8.9–10.3)
Chloride: 98 mmol/L (ref 98–111)
Creatinine, Ser: 0.99 mg/dL (ref 0.61–1.24)
GFR, Estimated: >60 mL/min (ref >60)
Glucose, Bld: 178 mg/dL — ABNORMAL HIGH (ref 70–99)
Potassium: 4 mmol/L (ref 3.5–5.1)
Sodium: 133 mmol/L — ABNORMAL LOW (ref 135–145)

## 2022-08-29 LAB — CBC
HCT: 27.9 % — ABNORMAL LOW (ref 39.0–52.0)
HCT: 26.3 % — ABNORMAL LOW (ref 39.0–52.0)
Hemoglobin: 8.6 g/dL — ABNORMAL LOW (ref 13.0–17.0)
Hemoglobin: 8.1 g/dL — ABNORMAL LOW (ref 13.0–17.0)
MCH: 26.9 pg (ref 26.0–34.0)
MCH: 26.7 pg (ref 26.0–34.0)
MCHC: 30.8 g/dL (ref 30.0–36.0)
MCHC: 30.8 g/dL (ref 30.0–36.0)
MCV: 87.2 fL (ref 80.0–100.0)
MCV: 86.8 fL (ref 80.0–100.0)
Platelets: 166 10*3/uL (ref 150–400)
Platelets: 163 10*3/uL (ref 150–400)
RBC: 3.2 MIL/uL — ABNORMAL LOW (ref 4.22–5.81)
RBC: 3.03 MIL/uL — ABNORMAL LOW (ref 4.22–5.81)
RDW: 19.3 % — ABNORMAL HIGH (ref 11.5–15.5)
RDW: 19.6 % — ABNORMAL HIGH (ref 11.5–15.5)
WBC: 7.7 10*3/uL (ref 4.0–10.5)
WBC: 7.4 10*3/uL (ref 4.0–10.5)
nRBC: 0 % (ref 0.0–0.2)
nRBC: 0 % (ref 0.0–0.2)

## 2022-08-29 LAB — MRSA NEXT GEN BY PCR, NASAL: MRSA by PCR Next Gen: NOT DETECTED

## 2022-08-29 LAB — TROPONIN I (HIGH SENSITIVITY): Troponin I (High Sensitivity): 78 ng/L — ABNORMAL HIGH (ref <18)

## 2022-08-29 LAB — TYPE AND SCREEN
ABO/RH(D): A NEG
Antibody Screen: NEGATIVE

## 2022-08-29 LAB — OCCULT BLOOD X 1 CARD TO LAB, STOOL: Fecal Occult Bld: POSITIVE — AB

## 2022-08-29 MED ORDER — TRAMADOL HCL 50 MG PO TABS
50.0000 mg | ORAL_TABLET | Freq: Three times a day (TID) | ORAL | Status: DC | PRN
  Administered 2022-08-29 – 2022-08-30 (×2): 50 mg via ORAL
  Filled 2022-08-29 (×3): qty 1

## 2022-08-29 MED ORDER — LACTATED RINGERS IV BOLUS
500.0000 mL | Freq: Once | INTRAVENOUS | Status: AC
Start: 2022-08-29 — End: 2022-08-29
  Administered 2022-08-29: 500 mL via INTRAVENOUS

## 2022-08-29 MED ORDER — PREDNISOLONE ACETATE 1 % OP SUSP
1.0000 [drp] | Freq: Every day | OPHTHALMIC | Status: DC
  Administered 2022-08-30 – 2022-09-15 (×16): 1 [drp] via OPHTHALMIC
  Filled 2022-08-29: qty 5

## 2022-08-29 MED ORDER — ORAL CARE MOUTH RINSE
15.0000 mL | OROMUCOSAL | Status: DC | PRN
  Administered 2022-08-30: 15 mL via OROMUCOSAL

## 2022-08-29 MED ORDER — PSYLLIUM 95 % PO PACK
1.0000 | PACK | Freq: Every day | ORAL | Status: DC
  Administered 2022-09-01 – 2022-09-14 (×11): 1 via ORAL
  Filled 2022-08-29 (×16): qty 1

## 2022-08-29 MED ORDER — OYSTER SHELL CALCIUM/D3 500-5 MG-MCG PO TABS
1.0000 | ORAL_TABLET | Freq: Every day | ORAL | Status: DC
  Administered 2022-08-30 – 2022-09-14 (×15): 1 via ORAL
  Filled 2022-08-29 (×13): qty 1

## 2022-08-29 MED ORDER — LATANOPROST 0.005 % OP SOLN
1.0000 [drp] | Freq: Every day | OPHTHALMIC | Status: DC
  Administered 2022-08-29 – 2022-09-14 (×17): 1 [drp] via OPHTHALMIC
  Filled 2022-08-29: qty 2.5

## 2022-08-29 MED ORDER — BRIMONIDINE TARTRATE 0.2 % OP SOLN
1.0000 [drp] | Freq: Two times a day (BID) | OPHTHALMIC | Status: DC
  Administered 2022-08-29 – 2022-09-15 (×34): 1 [drp] via OPHTHALMIC
  Filled 2022-08-29: qty 5

## 2022-08-29 MED ORDER — ADULT MULTIVITAMIN W/MINERALS CH
1.0000 | ORAL_TABLET | Freq: Every day | ORAL | Status: DC
  Administered 2022-08-30 – 2022-09-14 (×15): 1 via ORAL
  Filled 2022-08-29 (×15): qty 1

## 2022-08-29 MED ORDER — GABAPENTIN 300 MG PO CAPS
300.0000 mg | ORAL_CAPSULE | Freq: Four times a day (QID) | ORAL | Status: DC
  Administered 2022-08-29 – 2022-09-15 (×58): 300 mg via ORAL
  Filled 2022-08-29 (×59): qty 1

## 2022-08-29 MED ORDER — PANTOPRAZOLE SODIUM 40 MG PO TBEC: 40.0000 mg | DELAYED_RELEASE_TABLET | Freq: Every day | ORAL | Status: DC

## 2022-08-29 MED ORDER — PERPHENAZINE 2 MG PO TABS
2.0000 mg | ORAL_TABLET | ORAL | Status: DC
  Administered 2022-08-30 – 2022-09-13 (×7): 2 mg via ORAL
  Filled 2022-08-29 (×7): qty 1

## 2022-08-29 MED ORDER — VITAMIN D 25 MCG (1000 UNIT) PO TABS
1000.0000 [IU] | ORAL_TABLET | Freq: Every day | ORAL | Status: DC
  Administered 2022-08-30 – 2022-09-14 (×15): 1000 [IU] via ORAL
  Filled 2022-08-29 (×15): qty 1

## 2022-08-29 MED ORDER — PANTOPRAZOLE SODIUM 40 MG IV SOLR
40.0000 mg | Freq: Once | INTRAVENOUS | Status: AC
  Administered 2022-08-29: 40 mg via INTRAVENOUS
  Filled 2022-08-29: qty 10

## 2022-08-29 MED ORDER — SODIUM CHLORIDE 0.9 % IV BOLUS
500.0000 mL | Freq: Once | INTRAVENOUS | Status: AC
  Administered 2022-08-29: 500 mL via INTRAVENOUS

## 2022-08-29 MED ORDER — AMITRIPTYLINE HCL 25 MG PO TABS
25.0000 mg | ORAL_TABLET | Freq: Every day | ORAL | Status: DC
  Administered 2022-08-29 – 2022-09-14 (×17): 25 mg via ORAL
  Filled 2022-08-29 (×17): qty 1

## 2022-08-29 MED ORDER — CHLORHEXIDINE GLUCONATE CLOTH 2 % EX PADS
6.0000 | MEDICATED_PAD | Freq: Every day | CUTANEOUS | Status: DC
  Administered 2022-08-29 – 2022-08-31 (×3): 6 via TOPICAL

## 2022-08-29 MED ORDER — IPRATROPIUM BROMIDE 0.06 % NA SOLN
2.0000 | Freq: Two times a day (BID) | NASAL | Status: DC
  Administered 2022-08-29 – 2022-09-07 (×10): 2 via NASAL
  Filled 2022-08-29 (×2): qty 15

## 2022-08-29 MED ORDER — SODIUM CHLORIDE 0.9 % IV BOLUS
250.0000 mL | Freq: Once | INTRAVENOUS | Status: DC
Start: 2022-08-29 — End: 2022-08-29

## 2022-08-29 MED ORDER — PANTOPRAZOLE SODIUM 40 MG IV SOLR
40.0000 mg | Freq: Two times a day (BID) | INTRAVENOUS | Status: DC
  Administered 2022-08-30 – 2022-08-31 (×3): 40 mg via INTRAVENOUS
  Filled 2022-08-29 (×3): qty 10

## 2022-08-29 MED ORDER — VITAMIN B-12 1000 MCG PO TABS
1000.0000 ug | ORAL_TABLET | Freq: Every day | ORAL | Status: DC
  Administered 2022-08-30 – 2022-09-15 (×16): 1000 ug via ORAL
  Filled 2022-08-29 (×16): qty 1

## 2022-08-29 MED ORDER — PSYLLIUM 58.6 % PO POWD: 1.0000 | Freq: Every day | ORAL | Status: DC

## 2022-08-29 MED ORDER — DORZOLAMIDE HCL-TIMOLOL MAL 2-0.5 % OP SOLN
1.0000 [drp] | Freq: Two times a day (BID) | OPHTHALMIC | Status: DC
  Administered 2022-08-29 – 2022-09-15 (×34): 1 [drp] via OPHTHALMIC
  Filled 2022-08-29 (×2): qty 10

## 2022-08-29 MED ORDER — ROSUVASTATIN CALCIUM 5 MG PO TABS
5.0000 mg | ORAL_TABLET | ORAL | Status: DC
  Administered 2022-08-30 – 2022-09-15 (×7): 5 mg via ORAL
  Filled 2022-08-29 (×8): qty 1

## 2022-08-29 NOTE — ED Notes (Signed)
Family reports Pt has been increasing fatigued/sleepy x 2-3 days.

## 2022-08-29 NOTE — H&P (Signed)
History and Physical    Patient: Jason [name] XAJ:[id number] DOB: Apr 01, 1926 DOA: 08/29/2022 DOS: the patient was seen and examined on 08/29/2022 PCP: Jason Infante, MD  Patient coming from:  [place] ED  Chief Complaint:  Chief Complaint  Patient presents with   Fatigue   HPI: Jason [name] is a 89 y.o. male with medical history significant of legal blindness, CAD S/P CABG with AVR, chronic atrial fibrillation on Eliquis, OSA on CPAP, urothelial carcinoma s/p TURBT and right ureteral stent (12/2021), prostate cancer on surveillance, hx of colon cancer s/p right hemicolectomy who presents with fatigue.   Says for the past 3 months he has been feeling increase fatigue and increasing shortness of breath. No abdominal pain. He is legally blind but wife has not seen dark stool or blood. He is on Eliquis with last dose this morning.  Patient previous anticoagulated with Xarelto for atrial fibrillation but had GI bleed in January 2016 with EGD demonstrating gastric AVM that was ablated. He was then placed on Coumadin but had recurrent anemia in July 2017 that required blood transfusion. Had capsule endoscopy that showed small bowel AVMs. 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 He then was later transitioned to Eliquis.   Also for the past 3 weeks has worsening lower back pain. MRI L-spine on 08/07/22 showed severe endplate degeneration at L1-2 with associated discal hyperintensity, marrow and soft tissue edema. He is currently awaiting appointment with neurosurgery.   In the ED, he was hypotension with SBP 70s and was fluid responsive after 1L bolus. Hgb of 8.6 which had dropped from his baseline around 13 from 8 months ago.FOBT+ Creatinine of 0.99 with BUN of 13.   [facility] was consulted by EDP and will see in consultation in the morning. Advise NPO past midnight.  Pt then transfer to [facility] for further management.   Review of Systems: As  mentioned in the history of present illness. All other systems reviewed and are negative. Past Medical History:  Diagnosis Date   Abnormal PSA    Adenomatous polyp of colon 2010 and before 2006   Anemia    Anxiety    Aortic stenosis    Aortic valve prosthesis present    Atrial fibrillation (HCC)    CAD (coronary artery disease)    DDD (degenerative disc disease), lumbar    Depression    Diverticulosis    Dyslipidemia    GERD (gastroesophageal reflux disease)    Glaucoma    History of blood transfusion 09/29/2016   History of kidney stones    Hx of CABG    Hypogonadism, male    Macular degeneration    OSA (obstructive sleep apnea)    Osteoarthritis    Osteopenia    Prostate CA ([place]) prostate 2007   colon dx 2018, watching psa levels, no tx yet for prostate   Past Surgical History:  Procedure Laterality Date   AORTIC VALVE REPLACEMENT  October 2011   Magna Ease pericardial tissue valve #[id number]   BACK SURGERY  1978   lower   CATARACT EXTRACTION Bilateral    CORNEAL TRANSPLANT Right    CORONARY ARTERY BYPASS GRAFT  October 2011   LIMA to LAD   ESOPHAGOGASTRODUODENOSCOPY N/A 09/16/2014   Procedure: ESOPHAGOGASTRODUODENOSCOPY (EGD);  Surgeon: JIrene Shipper MD;  [facility]  Service: Endoscopy;  Laterality: N/A;   TONSILLECTOMY  76 yrs ago   TRANSURETHRAL RESECTION OF BLADDER TUMOR Right 01/04/2022   Procedure: TRANSURETHRAL  RESECTION OF BLADDER TUMOR (TURBT) WITH POST OPERATIVE INSTILLATION OF GEMCITABINE/ POSSIBLE RIGHT STENT PLACEMENT;  Surgeon: Ceasar Mons, MD;  Location: [facility];  Service: Urology;  Laterality: Right;   Social History:  reports that he quit smoking about 31 years ago. His smoking use included cigarettes. He has a 40.00 pack-year smoking history. He has never used smokeless tobacco. He reports current alcohol use of about 2.0 standard drinks of alcohol per week. He reports that he does not use drugs.  Allergies  Allergen Reactions    Xarelto [Rivaroxaban]     GI BLEED    Family History  Problem Relation Age of Onset   Heart attack Father    Colon cancer Neg Hx    Esophageal cancer Neg Hx    Stomach cancer Neg Hx    Rectal cancer Neg Hx     Prior to Admission medications   Medication Sig Start Date End Date Taking? Authorizing Provider  acetaminophen (TYLENOL) 500 MG tablet Take 1,000 mg by mouth in the morning and at bedtime.   Yes [provider]  amitriptyline (ELAVIL) 25 MG tablet Take 25 mg by mouth at bedtime.   Yes [provider]  apixaban (ELIQUIS) 5 MG TABS tablet Take 2.5 mg by mouth 2 (two) times daily.   Yes [provider]  brimonidine (ALPHAGAN) 0.2 % ophthalmic solution Place 1 drop into both eyes in the morning and at bedtime. 07/24/18  Yes [provider]  Calcium Carbonate-Vitamin D (CALCIUM-VITAMIN D) 500-200 MG-UNIT per tablet Take 1 tablet by mouth daily.   Yes [provider]  Cholecalciferol 1000 UNITS capsule Take 1,000 Units by mouth daily.    Yes [provider]  diclofenac Sodium (VOLTAREN) 1 % GEL Apply 1 application. topically in the morning and at bedtime.   Yes [provider]  dorzolamide-timolol (COSOPT) 22.3-6.8 MG/ML ophthalmic solution Place 1 drop into both eyes 2 (two) times daily. 11/07/18  Yes [provider]  gabapentin (NEURONTIN) 300 MG capsule Take 300 mg by mouth 4 (four) times daily.   Yes [provider]  ipratropium (ATROVENT) 0.06 % nasal spray Place 2 sprays into both nostrils in the morning and at bedtime. 11/10/21  Yes [provider]  latanoprost (XALATAN) 0.005 % ophthalmic solution Place 1 drop into both eyes daily. 05/16/15  Yes [provider]  metoprolol tartrate (LOPRESSOR) 25 MG tablet Take 1 tablet (25 mg total) by mouth 2 (two) times daily. Patient taking differently: Take 12.5 mg by mouth 2 (two) times daily. 02/17/19  Yes Martinique, Peter M, MD  Multiple Vitamin  (MULTIVITAMIN WITH MINERALS) TABS tablet Take 1 tablet by mouth daily.   Yes [provider]  pantoprazole (PROTONIX) 40 MG tablet Take 40 mg by mouth daily. 01/31/21  Yes [provider]  perphenazine (TRILAFON) 2 MG tablet Take 2 mg by mouth 3 (three) times a week. 12/16/20  Yes [provider]  prednisoLONE acetate (PRED FORTE) 1 % ophthalmic suspension Place 1 drop into the right eye daily.   Yes [provider]  psyllium (METAMUCIL) 58.6 % powder Take 1 packet by mouth daily.   Yes [provider]  rosuvastatin (CRESTOR) 5 MG tablet Take 5 mg by mouth every Monday, Wednesday, and Friday.   Yes [provider]  tamsulosin (FLOMAX) 0.4 MG CAPS capsule Take 0.4 mg by mouth daily. 04/05/22  Yes [provider]  traMADol (ULTRAM) 50 MG tablet Take 50 mg by mouth 3 (three) times  daily as needed. 08/26/22  Yes [provider]  vitamin B-12 (CYANOCOBALAMIN) 1000 MCG tablet Take 1,000 mcg by mouth daily.   Yes [provider]    Physical Exam: Vitals:   08/29/22 2000 08/29/22 2040 08/29/22 2044 08/29/22 2100  BP: 118/87 132/74  (!) 154/62  Pulse: 99 (!) 104 (!) 114 (!) 108  Resp: (!) 23 17 (!) 23 16  Temp:   (!) 97.3 F (36.3 C)   TempSrc:   Oral   SpO2: 90% 96% 92% (!) 64%  Weight:   69.4 kg   Height:       Constitutional: NAD, calm, comfortable, elderly male laying approximately 30 degree incline. Eyes:  lids and conjunctivae normal ENMT: Mucous membranes are moist. Neck: normal, supple Respiratory: clear to auscultation bilaterally, no wheezing, no crackles. Normal respiratory effort. No accessory muscle use.  Cardiovascular: Regular rate and rhythm, no murmurs / rubs / gallops. No extremity edema.  Abdomen: Soft, nontender nondistended.  No masses palpated.  Bowel sounds positive.  Musculoskeletal: no clubbing / cyanosis. No joint deformity upper and lower extremities. Good ROM, no contractures. Normal muscle  tone.  Skin: no rashes, lesions, ulcers. No induration Neurologic: CN 2-12 grossly intact.  Strength 5/5 in all 4.  Psychiatric: Normal judgment and insight. Alert and oriented x 3. Normal mood. 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 He was then placed on Coumadin but had recurrent anemia in July 2017 that required blood transfusion. Had capsule endoscopy that showed small bowel AVMs. 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 Has been on Eliquis since with last dose this morning (1/2). Continue to hold.  -Has received fluid in ED earlier for hypotension, will check repeat hemoglobin.  Given his history of CABG and AVMs, will transfuse for hemoglobin less than 8.  If there is a significant drop on repeat that is not explained by hemodilution with fluids, will obtain stat CT angio -Continue PPI -Keep on clear liquid diet and n.p.o. after midnight -[facility] has been consulted and will see in the morning  Hypotension -BP with SBP of 70s on presentation to ER. Has been fluid responsive and now normotensive. -hold antihypertensive  Hx of CABG -asymptomatic from cardiac standpoint. Holding anticoagulation due to GI bleed.  Atrial fibrillation, chronic (HCC) -holding Eliquis due to active GI bleed  OSA (obstructive sleep apnea) -usually on CPAP but has refused it for tonight.      Advance Care Planning: DNR-confirmed with pt  Consults: [facility]  Family Communication: Discussed with wife over the phone  Severity of Illness: The appropriate patient status for this patient is INPATIENT. Inpatient status is judged to be reasonable and necessary in order to provide the required intensity of service to ensure the patient's  safety. The patient's presenting symptoms, physical exam findings, and initial radiographic and laboratory data in the context of their chronic comorbidities is felt to place them at high risk for further clinical deterioration. Furthermore, it is not anticipated that the patient will be medically stable for discharge from the hospital within 2 midnights of admission.   * I certify that at the point of admission it is my clinical judgment that the patient will require inpatient  hospital care spanning beyond 2 midnights from the point of admission due to high intensity of service, high risk for further deterioration and high frequency of surveillance required.*  Author: Orene Desanctis, DO 08/29/2022 10:14 PM  For on call review [email or web address].

## 2022-08-29 NOTE — Assessment & Plan Note (Signed)
-  asymptomatic from cardiac standpoint. Holding anticoagulation due to GI bleed.

## 2022-08-29 NOTE — Assessment & Plan Note (Signed)
-  holding Eliquis due to active GI bleed

## 2022-08-29 NOTE — Assessment & Plan Note (Signed)
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 He was then placed on Coumadin but had recurrent anemia in July 2017 that required blood transfusion. Had capsule endoscopy that showed small bowel AVMs. 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 Has been on Eliquis since with last dose this morning (1/2). Continue to hold.  -Has received fluid in ED earlier for hypotension, will check repeat hemoglobin.  Given his history of CABG and AVMs, will transfuse for hemoglobin less than 8.  If there is a significant drop on repeat that is not explained by hemodilution with fluids, will obtain stat CT angio -Continue PPI -Keep on clear liquid diet and n.p.o. after midnight -[facility] has been consulted and will see in the morning

## 2022-08-29 NOTE — ED Triage Notes (Signed)
Patient here POV from [facility].  Endorses Fatigue for a few weeks that has worsened over the Past week. Associated with SOB that began approximately the same time.    History of Anemia.   NAD Noted during Triage. A&Ox4. GCS 15. BIB Wheelchair.

## 2022-08-29 NOTE — Progress Notes (Addendum)
Phone call from the [facility] 89 year old patient history of AAS, CAD status post CABG, A-fib on Eliquis last dose this morning, history of IDA with EGD 2016 gastric AVM status post APC, history of small bowel AVMs, history of colon cancer diagnosed 2018 status post hemicolectomy, repeat colonoscopy 2019 showing diverticulosis with Dr. Henrene Pastor presents to [facility] with worsening shortness of breath for 1 month.  Hemoglobin dropped 4-5.6 down to 8.6 over 8 months. Hemoccult positive with red stool. No elevation of BUN. Last dose of Eliquis this morning Patient with elevated troponins, normal EKG, monitoring. Started on PPI is being admitted to [facility].  Please contact GI, will see formally in consult, suggest getting CTA if patient is still having overt bleeding when he gets here.  Hold Eliquis. Transfuse HGB above 8 with history.  Please keep patient NPO in the morning.  Discussed case with ER PA, will see patient formal consultation in the morning. [name]

## 2022-08-29 NOTE — Assessment & Plan Note (Signed)
-  usually on CPAP but has refused it for tonight.

## 2022-08-29 NOTE — ED Provider Notes (Signed)
[facility] EMERGENCY DEPT Provider Note   CSN: [id number] Arrival date & time: 08/29/22  1230     History  Chief Complaint  Patient presents with   Fatigue    Jason [name] is a 89 y.o. male, history of aortic stenosis, CABG, lower GI bleed, who presents to the ED secondary to shortness of breath, fatigue for the last month.  He states that he has just been very tired, and having limited p.o. intake.  Has not been eating or drinking much.  States that he is extremely short of breath when he walks, and has a difficult time even walking a few feet.  Endorses a 40 pack smoking history.  Does not currently smoke.  Also reports some intermittent chest pain that occurs when he is sitting down.  Denies any black stools, blood in the stools.  Has been compliant with Eliquis.  Hx of colon cancer, prostate cancer, bladder cancer.    Home Medications Prior to Admission medications   Medication Sig Start Date End Date Taking? Authorizing Provider  acetaminophen (TYLENOL) 500 MG tablet Take 1,000 mg by mouth in the morning and at bedtime.   Yes [provider]  amitriptyline (ELAVIL) 25 MG tablet Take 25 mg by mouth at bedtime.   Yes [provider]  apixaban (ELIQUIS) 5 MG TABS tablet Take 2.5 mg by mouth 2 (two) times daily.   Yes [provider]  brimonidine (ALPHAGAN) 0.2 % ophthalmic solution Place 1 drop into both eyes in the morning and at bedtime. 07/24/18  Yes [provider]  Calcium Carbonate-Vitamin D (CALCIUM-VITAMIN D) 500-200 MG-UNIT per tablet Take 1 tablet by mouth daily.   Yes [provider]  Cholecalciferol 1000 UNITS capsule Take 1,000 Units by mouth daily.    Yes [provider]  diclofenac Sodium (VOLTAREN) 1 % GEL Apply 1 application. topically in the morning and at bedtime.   Yes [provider]  dorzolamide-timolol (COSOPT) 22.3-6.8 MG/ML ophthalmic solution Place 1 drop into both eyes 2 (two)  times daily. 11/07/18  Yes [provider]  gabapentin (NEURONTIN) 300 MG capsule Take 300 mg by mouth 4 (four) times daily.   Yes [provider]  ipratropium (ATROVENT) 0.06 % nasal spray Place 2 sprays into both nostrils in the morning and at bedtime. 11/10/21  Yes [provider]  latanoprost (XALATAN) 0.005 % ophthalmic solution Place 1 drop into both eyes daily. 05/16/15  Yes [provider]  metoprolol tartrate (LOPRESSOR) 25 MG tablet Take 1 tablet (25 mg total) by mouth 2 (two) times daily. Patient taking differently: Take 12.5 mg by mouth 2 (two) times daily. 02/17/19  Yes Martinique, Peter M, MD  Multiple Vitamin (MULTIVITAMIN WITH MINERALS) TABS tablet Take 1 tablet by mouth daily.   Yes [provider]  pantoprazole (PROTONIX) 40 MG tablet Take 40 mg by mouth daily. 01/31/21  Yes [provider]  perphenazine (TRILAFON) 2 MG tablet Take 2 mg by mouth 3 (three) times a week. 12/16/20  Yes [provider]  prednisoLONE acetate (PRED FORTE) 1 % ophthalmic suspension Place 1 drop into the right eye daily.   Yes [provider]  psyllium (METAMUCIL) 58.6 % powder Take 1 packet by mouth daily.   Yes [provider]  rosuvastatin (CRESTOR) 5 MG tablet Take 5 mg by mouth every Monday, Wednesday, and Friday.   Yes [provider]  tamsulosin (FLOMAX) 0.4 MG CAPS capsule Take 0.4 mg by mouth daily. 04/05/22  Yes  [provider]  traMADol (ULTRAM) 50 MG tablet Take 50 mg by mouth 3 (three) times daily as needed. 08/26/22  Yes [provider]  vitamin B-12 (CYANOCOBALAMIN) 1000 MCG tablet Take 1,000 mcg by mouth daily.   Yes [provider]      Allergies    Xarelto [rivaroxaban]    Review of Systems   Review of Systems  Respiratory:  Positive for shortness of breath.   Cardiovascular:  Positive for chest pain. Negative for leg swelling.    Physical Exam Updated Vital Signs BP 94/63    Pulse 77   Temp 97.7 F (36.5 C)   Resp (!) 27   Ht '5\' 10"'$  (1.778 m)   Wt 77.1 kg   SpO2 99%   BMI 24.39 kg/m  Physical Exam Vitals and nursing note reviewed.  Constitutional:      General: He is not in acute distress.    Appearance: He is well-developed.  HENT:     Head: Normocephalic and atraumatic.  Eyes:     Conjunctiva/sclera: Conjunctivae normal.  Cardiovascular:     Rate and Rhythm: Normal rate and regular rhythm.     Heart sounds: No murmur heard. Pulmonary:     Effort: Pulmonary effort is normal. No respiratory distress.     Breath sounds: Normal breath sounds.  Abdominal:     Palpations: Abdomen is soft.     Tenderness: There is no abdominal tenderness.  Musculoskeletal:        General: No swelling.     Cervical back: Neck supple.  Skin:    General: Skin is warm and dry.     Capillary Refill: Capillary refill takes less than 2 seconds.  Neurological:     Mental Status: He is alert.  Psychiatric:        Mood and Affect: Mood normal.     ED Results / Procedures / Treatments   Labs (all labs ordered are listed, but only abnormal results are displayed) Labs Reviewed  BASIC METABOLIC PANEL - Abnormal; Notable for the following components:      Result Value   Sodium 133 (*)    Glucose, Bld 178 (*)    Calcium 8.6 (*)    All other components within normal limits  CBC - Abnormal; Notable for the following components:   RBC 3.20 (*)    Hemoglobin 8.6 (*)    HCT 27.9 (*)    RDW 19.3 (*)    All other components within normal limits  OCCULT BLOOD X 1 CARD TO LAB, STOOL - Abnormal; Notable for the following components:   Fecal Occult Bld POSITIVE (*)    All other components within normal limits  TROPONIN I (HIGH SENSITIVITY) - Abnormal; Notable for the following components:   Troponin I (High Sensitivity) 78 (*)    All other components within normal limits    EKG EKG Interpretation  Date/Time:  Tuesday August 29 2022 13:09:08 EST Ventricular Rate:   105 PR Interval:    QRS Duration: 120 QT Interval:  378 QTC Calculation: 499 R Axis:   -69 Text Interpretation: Atrial fibrillation with rapid ventricular response with premature ventricular or aberrantly conducted complexes Left axis deviation Inferior infarct , age undetermined Anteroseptal infarct (cited on or before 29-Oct-2016) When compared with ECG of 29-Oct-2016 11:06, Inferior infarct is now Present Questionable change in initial forces of Anterior leads QT has lengthened Confirmed by Blanchie Dessert [phone]) on 08/29/2022 1:24:31 PM  Radiology DG Chest 2 View  Result  Date: 08/29/2022 CLINICAL DATA:  Shortness of breath EXAM: CHEST - 2 VIEW COMPARISON:  07/15/2010 chest radiograph and 10/04/2021 CT FINDINGS: Cardiomegaly and aortic valve replacement again noted. Elevation of LEFT hemidiaphragm and minimal LEFT basilar scarring/atelectasis again noted. There is no evidence of focal airspace disease, pulmonary edema, suspicious pulmonary nodule/mass, pleural effusion, or pneumothorax. No acute bony abnormalities are identified. IMPRESSION: Cardiomegaly without evidence of acute cardiopulmonary disease. Electronically Signed   By: Margarette Canada M.D.   On: 08/29/2022 13:34    Procedures Procedures    Medications Ordered in ED Medications  pantoprazole (PROTONIX) injection 40 mg (has no administration in time range)  lactated ringers bolus 500 mL (0 mLs Intravenous Stopped 08/29/22 1440)    ED Course/ Medical Decision Making/ A&P                           Medical Decision Making Patient is a 89 year old male, here for shortness of breath has been going on for about a month, and weakness.  We will obtain troponins, secondary to chest discomfort, and EKG, chest x-ray is secondary to shortness of breath chest discomfort.  And Hemoccult given hemoglobin of 8.  Amount and/or Complexity of Data Reviewed Labs: ordered.    Details: Hemoglobin of 8.6.  Hemoccult positive troponin of  78 Radiology: ordered.    Details: Chest x-ray shows cardiomegaly ECG/medicine tests:  Decision-making details documented in ED Course. Discussion of management or test interpretation with external provider(s): Discussed with Dr. Karleen Hampshire, she accepted admission, requests GI consult.  We will admit secondary to GI bleed, hypotensive, requiring IV fluids of 500 mL bolus, blood pressure improving at this time.  His hemoglobin has dropped around 4-5 points since 8 months ago.  And he is hypotensive, requiring IV fluids, not requiring any oxygen at this time however.  Believe his shortness of breath is likely secondary to his GI bleed, will start on PPI.  Spoke with GI PA Estill Bamberg, she is aware of patient, and will consult when transferred to [facility].  Risk Prescription drug management. Decision regarding hospitalization.   Final Clinical Impression(s) / ED Diagnoses Final diagnoses:  Gastrointestinal hemorrhage, unspecified gastrointestinal hemorrhage type  Anemia, unspecified type    Rx / DC Orders ED Discharge Orders     None         Kewan Mcnease, Si Gaul, PA 08/29/22 1523    Blanchie Dessert, MD 09/03/22 1651

## 2022-08-29 NOTE — ED Notes (Signed)
Called [facility] and spoke to [place]; informed that the patient Bed Assignment is ready

## 2022-08-29 NOTE — ED Notes (Signed)
Called [facility] and spoke to [place]; informed that the patient Bed Assignment is ready

## 2022-08-29 NOTE — Progress Notes (Signed)
89 year old gentleman with piror h/o of OSA on CPAP, CAD, s/p cabg, AVR, chronic atrial fibrillation on eliquis, h/o prostate cancer, GI bleed s/p EGD showing gastric AVM's, metastatic colon cancer s/p partial colectomy and adjuvant chemotherapy, follows up with Dr Benay Spice , iron deficiency anemia   presents with generalized weakness, was found to have symptomatic anemia.   Baseline hemoglobin around 13 in 12/2021 , dropped to 8.6 today .  Guiac positive stools . Last seen by Dr Henrene Pastor.   Patient accepted to [facility] stepdown for symptomatic anemia from Gi bleed. GI will be consulted by EDP.    Hosie Poisson. MD

## 2022-08-29 NOTE — Assessment & Plan Note (Addendum)
-  BP with SBP of 70s on presentation to ER. Has been fluid responsive and now normotensive. -hold antihypertensive

## 2022-08-30 ENCOUNTER — Other Ambulatory Visit: Payer: Self-pay

## 2022-08-30 ENCOUNTER — Telehealth: Payer: Self-pay

## 2022-08-30 ENCOUNTER — Inpatient Hospital Stay (HOSPITAL_COMMUNITY): Payer: Medicare Other

## 2022-08-30 ENCOUNTER — Telehealth (HOSPITAL_COMMUNITY): Payer: Self-pay | Admitting: *Deleted

## 2022-08-30 DIAGNOSIS — K31819 Angiodysplasia of stomach and duodenum without bleeding: Secondary | ICD-10-CM | POA: Diagnosis not present

## 2022-08-30 DIAGNOSIS — I959 Hypotension, unspecified: Secondary | ICD-10-CM

## 2022-08-30 DIAGNOSIS — K5520 Angiodysplasia of colon without hemorrhage: Secondary | ICD-10-CM

## 2022-08-30 DIAGNOSIS — R195 Other fecal abnormalities: Secondary | ICD-10-CM | POA: Diagnosis not present

## 2022-08-30 DIAGNOSIS — D649 Anemia, unspecified: Secondary | ICD-10-CM | POA: Diagnosis not present

## 2022-08-30 DIAGNOSIS — K317 Polyp of stomach and duodenum: Secondary | ICD-10-CM

## 2022-08-30 DIAGNOSIS — K922 Gastrointestinal hemorrhage, unspecified: Secondary | ICD-10-CM | POA: Diagnosis not present

## 2022-08-30 DIAGNOSIS — Z8639 Personal history of other endocrine, nutritional and metabolic disease: Secondary | ICD-10-CM

## 2022-08-30 DIAGNOSIS — I482 Chronic atrial fibrillation, unspecified: Secondary | ICD-10-CM | POA: Diagnosis not present

## 2022-08-30 DIAGNOSIS — G4733 Obstructive sleep apnea (adult) (pediatric): Secondary | ICD-10-CM | POA: Diagnosis not present

## 2022-08-30 LAB — IRON AND TIBC
Iron: 15 ug/dL — ABNORMAL LOW (ref 45–182)
Saturation Ratios: 5 % — ABNORMAL LOW (ref 17.9–39.5)
TIBC: 276 ug/dL (ref 250–450)
UIBC: 261 ug/dL

## 2022-08-30 LAB — CBC
HCT: 25.8 % — ABNORMAL LOW (ref 39.0–52.0)
Hemoglobin: 8 g/dL — ABNORMAL LOW (ref 13.0–17.0)
MCH: 26.8 pg (ref 26.0–34.0)
MCHC: 31 g/dL (ref 30.0–36.0)
MCV: 86.6 fL (ref 80.0–100.0)
Platelets: 170 10*3/uL (ref 150–400)
RBC: 2.98 MIL/uL — ABNORMAL LOW (ref 4.22–5.81)
RDW: 19.6 % — ABNORMAL HIGH (ref 11.5–15.5)
WBC: 6.5 10*3/uL (ref 4.0–10.5)
nRBC: 0 % (ref 0.0–0.2)

## 2022-08-30 LAB — FERRITIN: Ferritin: 69 ng/mL (ref 24–336)

## 2022-08-30 LAB — RETICULOCYTES
Immature Retic Fract: 28.6 % — ABNORMAL HIGH (ref 2.3–15.9)
RBC.: 3.27 MIL/uL — ABNORMAL LOW (ref 4.22–5.81)
Retic Count, Absolute: 40.5 10*3/uL (ref 19.0–186.0)
Retic Ct Pct: 1.2 % (ref 0.4–3.1)

## 2022-08-30 LAB — VITAMIN B12: Vitamin B-12: 1571 pg/mL — ABNORMAL HIGH (ref 180–914)

## 2022-08-30 LAB — FOLATE: Folate: 21.6 ng/mL (ref >5.9)

## 2022-08-30 LAB — HEMOGLOBIN AND HEMATOCRIT, BLOOD
HCT: 28.8 % — ABNORMAL LOW (ref 39.0–52.0)
Hemoglobin: 8.6 g/dL — ABNORMAL LOW (ref 13.0–17.0)

## 2022-08-30 MED ORDER — IOHEXOL 300 MG/ML  SOLN
100.0000 mL | Freq: Once | INTRAMUSCULAR | Status: AC | PRN
  Administered 2022-08-30: 100 mL via INTRAVENOUS

## 2022-08-30 MED ORDER — TRAMADOL HCL 50 MG PO TABS
100.0000 mg | ORAL_TABLET | Freq: Two times a day (BID) | ORAL | Status: DC | PRN
  Administered 2022-08-30 – 2022-09-01 (×3): 100 mg via ORAL
  Filled 2022-08-30 (×3): qty 2

## 2022-08-30 MED ORDER — METOPROLOL TARTRATE 25 MG PO TABS
12.5000 mg | ORAL_TABLET | Freq: Two times a day (BID) | ORAL | Status: DC
  Administered 2022-08-30 – 2022-09-01 (×4): 12.5 mg via ORAL
  Filled 2022-08-30 (×5): qty 1

## 2022-08-30 MED ORDER — IOHEXOL 9 MG/ML PO SOLN
500.0000 mL | ORAL | Status: AC
  Administered 2022-08-30: 500 mL via ORAL

## 2022-08-30 MED ORDER — IOHEXOL 9 MG/ML PO SOLN
ORAL | Status: AC
  Administered 2022-08-30: 500 mL via ORAL
  Filled 2022-08-30: qty 1000

## 2022-08-30 MED ORDER — METOPROLOL TARTRATE 5 MG/5ML IV SOLN
2.5000 mg | Freq: Three times a day (TID) | INTRAVENOUS | Status: DC | PRN
  Filled 2022-08-30: qty 5

## 2022-08-30 NOTE — Progress Notes (Signed)
[facility]                                                                               Jason [name], is a 89 y.o. male, DOB - [birth year], JKD:[id number] Admit date - 08/29/2022    Outpatient Primary MD for the patient is [name], MD  LOS - 1  days    Brief summary    89 year old gentleman prior history of coronary artery disease s/p CABG, AVR, chronic atrial fibrillation on anticoagulation with Eliquis, obstructive sleep apnea on CPAP, bladder carcinoma s/p TURB, prostate cancer, history of GI bleed with presence of gastric and small bowel AVMs on endoscopy, history of colon cancer s/p right hemicolectomy presents with generalized weakness and fatigue and was found to have a hemoglobin of 8.6.   Assessment & Plan    Assessment and Plan:   Acute anemia of blood loss probably secondary to GI bleed Patient presented with a hemoglobin of 8 which has dropped from a baseline around 13. Holding Eliquis for now. CT of the abdomen and pelvis with contrast does not show any metastatic disease in the abdomen. GI consulted and he is scheduled for EGD and possible enteroscopy in the morning.  Cardiology consulted for preop clearance in view of his extensive cardiac history. Anemia panel ordered. Transfuse to keep hemoglobin greater than 8. Recheck H&H later this afternoon. Continue with IV Protonix 40 mg every 12 hours.    Hypotension Probably secondary to hypovolemia from GI blood loss. Blood pressure parameters this morning have improved.   Hx of CABG, AVR -Patient currently denies any chest pain or shortness of breath. Anticoagulation on hold  Atrial fibrillation, chronic (HCC) Rate controlled with metoprolol 12.5 mg twice daily. Eliquis on hold due to active GI bleed  OSA (obstructive sleep apnea) On CPAP   Chronic back pain Patient is currently on tramadol for pain control.    Hyperlipidemia Continue with Crestor 5 mg 3 times a week      Pressure ulcer present on admission RN Pressure Injury Documentation: Pressure Injury 08/29/22 Vertebral column Lower Stage 2 -  Partial thickness loss of dermis presenting as a shallow open injury with a red, pink wound bed without slough. two quarter sized pressure wounds over vertebra (Active)  08/29/22 1410  Location: Vertebral column  Location Orientation: Lower  Staging: Stage 2 -  Partial thickness loss of dermis presenting as a shallow open injury with a red, pink wound bed without slough.  Wound Description (Comments): two quarter sized pressure wounds over vertebra  Present on Admission: Yes  Dressing Type Foam - Lift dressing to assess site every shift 08/29/22 2040   Foam wound dressing in place    Estimated body mass index is 21.95 kg/m as calculated from the following:   Height as of this encounter: '5\' 10"'$  (1.778 m).   Weight as of this encounter: 69.4 kg.  Code Status: DNR DVT Prophylaxis:  SCDs Start: 08/29/22 2210   Level of Care: Level of care: Stepdown Family Communication: NONE at bedside.   Disposition Plan:     Remains inpatient appropriate:  IV PPI, EGD.   Procedures:  None.   Consultants:  Gastroenterology.   Antimicrobials:   Anti-infectives (From admission, onward)    None        Medications  Scheduled Meds:  amitriptyline  25 mg Oral QHS   brimonidine  1 drop Both Eyes BID   calcium-vitamin D  1 tablet Oral Daily   Chlorhexidine Gluconate Cloth  6 each Topical Daily   cholecalciferol  1,000 Units Oral Daily   cyanocobalamin  1,000 mcg Oral Daily   dorzolamide-timolol  1 drop Both Eyes BID   gabapentin  300 mg Oral QID   ipratropium  2 spray Each Nare BID   latanoprost  1 drop Both Eyes QHS   metoprolol tartrate  12.5 mg Oral BID   multivitamin with minerals  1 tablet Oral Daily   pantoprazole (PROTONIX) IV  40 mg Intravenous Q12H   perphenazine  2 mg Oral Once per day on Mon Wed Fri   prednisoLONE acetate  1 drop Right Eye  Daily   psyllium  1 packet Oral Daily   rosuvastatin  5 mg Oral Q M,W,F   Continuous Infusions: PRN Meds:.metoprolol tartrate, mouth rinse, traMADol    Subjective:   Jason [name] was seen and examined today.  Back pain,  Objective:   Vitals:   08/30/22 0029 08/30/22 0354 08/30/22 0405 08/30/22 0800  BP:   95/64 114/71  Pulse:   (!) 111 (!) 102  Resp:   17 15  Temp: 98.3 F (36.8 C) 98.2 F (36.8 C)  98.3 F (36.8 C)  TempSrc: Oral Oral  Oral  SpO2:   98% 99%  Weight:      Height:        Intake/Output Summary (Last 24 hours) at 08/30/2022 1013 Last data filed at 08/30/2022 0405 Gross per 24 hour  Intake 650.37 ml  Output 300 ml  Net 350.37 ml   Filed Weights   08/29/22 1305 08/29/22 2044  Weight: 77.1 kg 69.4 kg     Exam General exam: Appears calm and comfortable  Respiratory system: Clear to auscultation. Respiratory effort normal. Cardiovascular system: S1 & S2 heard, RRR. No JVD,  No pedal edema. Gastrointestinal system: Abdomen is nondistended, soft and nontender. Central nervous system: Alert and oriented. No focal neurological deficits. Extremities: Symmetric 5 x 5 power. Skin: No rashes, lesions or ulcers Psychiatry: Mood & affect appropriate.     Data Reviewed:  I have personally reviewed following labs and imaging studies   CBC Lab Results  Component Value Date   WBC 6.5 08/30/2022   RBC 2.98 (L) 08/30/2022   HGB 8.0 (L) 08/30/2022   HCT 25.8 (L) 08/30/2022   MCV 86.6 08/30/2022   MCH 26.8 08/30/2022   PLT 170 08/30/2022   MCHC 31.0 08/30/2022   RDW 19.6 (H) 08/30/2022   LYMPHSABS 1.2 05/09/2017   MONOABS 0.6 05/09/2017   EOSABS 0.3 05/09/2017   BASOSABS 0.0 71/24/5809     Last metabolic panel Lab Results  Component Value Date   NA 133 (L) 08/29/2022   K 4.0 08/29/2022   CL 98 08/29/2022   CO2 25 08/29/2022   BUN 13 08/29/2022   CREATININE 0.99 08/29/2022   GLUCOSE 178 (H) 08/29/2022   GFRNONAA >60 08/29/2022   GFRAA >60  11/01/2016   CALCIUM 8.6 (L) 08/29/2022   PROT 7.5 04/11/2017   ALBUMIN 3.9 04/11/2017   LABGLOB 3.2 03/29/2016   BILITOT 0.91 04/11/2017   ALKPHOS 54 04/11/2017   AST 22 04/11/2017   ALT 19 04/11/2017   ANIONGAP 10 08/29/2022  CBG (last 3)  No results for input(s): "GLUCAP" in the last 72 hours.    Coagulation Profile: No results for input(s): "INR", "PROTIME" in the last 168 hours.   Radiology Studies: DG Chest 2 View  Result Date: 08/29/2022 CLINICAL DATA:  Shortness of breath EXAM: CHEST - 2 VIEW COMPARISON:  07/15/2010 chest radiograph and 10/04/2021 CT FINDINGS: Cardiomegaly and aortic valve replacement again noted. Elevation of LEFT hemidiaphragm and minimal LEFT basilar scarring/atelectasis again noted. There is no evidence of focal airspace disease, pulmonary edema, suspicious pulmonary nodule/mass, pleural effusion, or pneumothorax. No acute bony abnormalities are identified. IMPRESSION: Cardiomegaly without evidence of acute cardiopulmonary disease. Electronically Signed   By: Margarette Canada M.D.   On: 08/29/2022 13:34       Hosie Poisson M.D. [facility] 08/30/2022, 10:13 AM  Available via Epic secure chat 7am-7pm After 7 pm, please refer to night coverage provider listed on amion.

## 2022-08-30 NOTE — Consult Note (Addendum)
Consultation  Referring Provider:   [facility] Primary Care Physician:  [name], MD Primary Gastroenterologist:  Dr. Henrene Pastor       Reason for Consultation: Acute on chronic symptomatic anemia, FOBT positive      Impression    GI bleed 89 year old male on Eliquis 2.5 mg last dose 01/02 in the morning history of small bowel and gastric AVMs 11-13-15 colon cancer 2016/11/12 status post right colectomy, repeat 11/12/2017 no recurrence 09/2021 CT abdomen pelvis, unable to see report, but no metastatic disease. presents with acute on chronic anemia, FOBT + hemoglobin 8.6 on admission compared to 10 on 06/30/2022, had 2 IV iron transfusions with PCP at that time and 12.1 on 03/20/2022 and 14.6 on 10/31/2021. initially some hypotension responding to IV fluids, currently hemodynamically stable Eliquis on hold. No elevation of BUN HGB 8.0 MCV 86.6 Platelets 170 BUN 13 Cr 0.99   Acute on chronic symptomatic anemia Eliquis on hold, FOBT + 08/30/2022  HGB 8.0 MCV 86.6 Platelets 170 No anemia studies, previously 7 units PRBC 2016-11-12,  was getting IV iron with hematology but has been stable since 11/12/2016  Elevated troponin with some chest pain and worsening shortness of breath last 2 to 3 months Troponin 78, history of CAD status post CABG Possibly related to demand ischemia, also some RVR with his A-fib  Atrial fibrillation On Eliquis 2.5 mg twice daily last dose 01/02 Currently on hold. Some RVR while in the room  Hypotension Likely from acute on chronic anemia, responded to IV fluids.  Urethral cell carcinoma and prostate cancer status post TURBT and right ureteral stent (12/2021) S/p treatment 09/2021 bone scan without mets  Degenerative disc disease with spinal stenosis MRI lumbar spine without contrast 08/07/2022 Suspect following up with neurosurgery No signs of metastasis.  Principal Problem:   GI bleed Active Problems:   OSA (obstructive sleep apnea)   Atrial fibrillation, chronic (HCC)    Anemia associated with acute blood loss   Hx of CABG   Hypotension    LOS: 1 day     Plan   Acute on chronic anemia, FOBT positive, patient on Eliquis with history of gastric and small bowel AVMs as well as history of colon cancer status post hemicolectomy 11/12/16, last colon 11/12/2017. Last dose Eliquis 08/29/2022 No family at bedside, patient was in pain with his back, discussed likely having some sort of chronic slow bleed since has been going on since at least November. Last screening CT was 09/2021.  Will discuss further with Dr. Rush Landmark, the patient is not willing to undergo colonoscopy at this time due to his back pain and age with the bowel prep We can repeat screening CT abdomen and pelvis with and without contrast, and evaluate with enteroscopy.  However if this is negative will need to consider repeating colonoscopy. With patient's recent shortness of breath and chest pain and cardiac history with RVR in the room, will also request cardiac clearance prior to endoscopic evaluation.  Spoke with cardiology and they have given clearance for the patient to pursue endoscopic evaluation with normal echo and current EKG.  Will get anemia studies, continue supportive care with IV iron as needed. --Continue to monitor H&H with transfusion as needed to maintain hemoglobin greater than 8 given cardiac history. -Further recommendations per Dr. Rush Landmark  Thank you for your kind consultation, we will continue to follow.         HPI:   Jason [name] is a 89 y.o.  male with past medical history significant for CAD status post CABG with AVR, chronic A-fib on Eliquis, OSA on CPAP, urothelial carcinoma s/p TURBT and right ureteral stent (12/2021), prostate cancer on surveillance, history of IDA with gastric and small bowel AVMs, history of colon cancer status post right hemicolectomy 2018 presents with fatigue, found to have acute on chronic symptomatic anemia.  08/2014 EGD for melena, anemia.  Reached to D2.  APC ablation of proximal gastric AVMs.  O/w unremarkable study.  02/2016 Capsule Endoscopy:  [name] gland hyperplasia.  A few, scattered, SB  AVMs 09/06/2016 colonoscopy for IDA and abnormal CT showed malignant appearing tumor in colon, 1 benign 4 mm polyp in rectum and moderate diverticulosis left colon 10/24/2016 robotic proximal right colectomy with Dr. Johney Maine showed invasive well-differentiated adenocarcinoma 6.2 cm.  Patient was lymph node positive had 8 cycles of Xeloda 03/11/2018 colonoscopy for surveillance of colon cancer 3 mm polyp descending colon multiple small and large mouth diverticula transverse and left colon tubular adenoma no recall due to age. Continues to follow with Dr. Benay Spice last [place] was 10/13/2021.  10/04/2021 CT unable to see without evidence of metastatic disease, nodular enhancing area right UVJ and irregularity left bladder base.  Lives at friend's home independently with wife.  Legally blind.  Earlier this year patient was active going to the gym at least twice weekly doing aerobic exercise and weights denying chest pain dyspnea palpitations. In November he saw cardiology for worsening fatigue and shortness of breath, found to have hemoglobin of 10 at that time down from 14.9.  Per patient and cardiology notes had 2 IV iron transfusions at that time.  Patient states he was not on oral iron that he knows of. Had subsequent echocardiogram 08/18/2022 with previous AVR unremarkable, EF 60-65%, no wall motion abnormalities.   Patient was arranged to have stress test however presented to the ER prior to that. Presented to the ER with 3 months of increasing fatigue and shortness of breath. Patient dates has been having left-sided chest pain sharp, acute not to still associate with exertion, worsening shortness of breath with exertion 2 to 3 months and extreme fatigue.  States he is also been dealing with lower back pain and has had decreased weight due to the  combination of fatigue and his back.  States he has not had much of an appetite. He is also had some nausea but no vomiting.  Denies any reflux dysphagia abdominal pain. Has had chills but no fever.  Has baseline of alternating constipation diarrhea, denies hematochezia or melena though patient is legally blind unable to see this, wife states he has not had any.  However in the ER patient had FOBT positive with the red blood. Patient is on Eliquis 2.5 mg twice daily last dose was 01/02 in the morning.  Patient did have hypotension in the ER SBP 70s, responsive after 1 L bolus. Labs in the ER showed positive FOBT, hemoglobin 8.6 compared to 10 on 06/30/2022 and 12.1 on 03/20/2022 and 14.6 on 10/31/2021.  MCV 87.2, platelets 166, sodium 133, glucose 178, calcium 8.6 troponin 78 and I do not see a repeat. 10/13/2021 CEA 7.37, continued mild elevation. This morning hemoglobin 8.0.   Abnormal ED labs: Abnormal Labs Reviewed  BASIC METABOLIC PANEL - Abnormal; Notable for the following components:      Result Value   Sodium 133 (*)    Glucose, Bld 178 (*)    Calcium 8.6 (*)    All other components within  normal limits  CBC - Abnormal; Notable for the following components:   RBC 3.20 (*)    Hemoglobin 8.6 (*)    HCT 27.9 (*)    RDW 19.3 (*)    All other components within normal limits  OCCULT BLOOD X 1 CARD TO LAB, STOOL - Abnormal; Notable for the following components:   Fecal Occult Bld POSITIVE (*)    All other components within normal limits  CBC - Abnormal; Notable for the following components:   RBC 2.98 (*)    Hemoglobin 8.0 (*)    HCT 25.8 (*)    RDW 19.6 (*)    All other components within normal limits  CBC - Abnormal; Notable for the following components:   RBC 3.03 (*)    Hemoglobin 8.1 (*)    HCT 26.3 (*)    RDW 19.6 (*)    All other components within normal limits  TROPONIN I (HIGH SENSITIVITY) - Abnormal; Notable for the following components:   Troponin I (High  Sensitivity) 78 (*)    All other components within normal limits     Past Medical History:  Diagnosis Date   Abnormal PSA    Adenomatous polyp of colon 2010 and before 2006   Anemia    Anxiety    Aortic stenosis    Aortic valve prosthesis present    Atrial fibrillation (HCC)    CAD (coronary artery disease)    DDD (degenerative disc disease), lumbar    Depression    Diverticulosis    Dyslipidemia    GERD (gastroesophageal reflux disease)    Glaucoma    History of blood transfusion 09/29/2016   History of kidney stones    Hx of CABG    Hypogonadism, male    Macular degeneration    OSA (obstructive sleep apnea)    Osteoarthritis    Osteopenia    Prostate CA ([place]) prostate 2007   colon dx 2018, watching psa levels, no tx yet for prostate    Surgical History:  He  has a past surgical history that includes Aortic valve replacement (October 2011); Cataract extraction (Bilateral); Corneal transplant (Right); Esophagogastroduodenoscopy (N/A, 09/16/2014); Back surgery (1978); Coronary artery bypass graft (October 2011); Tonsillectomy (76 yrs ago); and Transurethral resection of bladder tumor (Right, 01/04/2022). Family History:  His family history includes Heart attack in his father. Social History:   reports that he quit smoking about 31 years ago. His smoking use included cigarettes. He has a 40.00 pack-year smoking history. He has never used smokeless tobacco. He reports current alcohol use of about 2.0 standard drinks of alcohol per week. He reports that he does not use drugs.  Prior to Admission medications   Medication Sig Start Date End Date Taking? Authorizing Provider  acetaminophen (TYLENOL) 500 MG tablet Take 1,000 mg by mouth in the morning and at bedtime.   Yes [provider]  amitriptyline (ELAVIL) 25 MG tablet Take 25 mg by mouth at bedtime.   Yes [provider]  apixaban (ELIQUIS) 5 MG TABS tablet Take 2.5 mg by mouth 2 (two) times daily.   Yes  [provider]  brimonidine (ALPHAGAN) 0.2 % ophthalmic solution Place 1 drop into both eyes in the morning and at bedtime. 07/24/18  Yes [provider]  Calcium Carbonate-Vitamin D (CALCIUM-VITAMIN D) 500-200 MG-UNIT per tablet Take 1 tablet by mouth daily.   Yes [provider]  Cholecalciferol 1000 UNITS capsule Take 1,000 Units by mouth daily.    Yes [provider]  diclofenac Sodium (VOLTAREN) 1 % GEL Apply 1 application. topically in the morning and at bedtime.   Yes [provider]  dorzolamide-timolol (COSOPT) 22.3-6.8 MG/ML ophthalmic solution Place 1 drop into both eyes 2 (two) times daily. 11/07/18  Yes [provider]  gabapentin (NEURONTIN) 300 MG capsule Take 300 mg by mouth 4 (four) times daily.   Yes [provider]  ipratropium (ATROVENT) 0.06 % nasal spray Place 2 sprays into both nostrils in the morning and at bedtime. 11/10/21  Yes [provider]  latanoprost (XALATAN) 0.005 % ophthalmic solution Place 1 drop into both eyes daily. 05/16/15  Yes [provider]  metoprolol tartrate (LOPRESSOR) 25 MG tablet Take 1 tablet (25 mg total) by mouth 2 (two) times daily. Patient taking differently: Take 12.5 mg by mouth 2 (two) times daily. 02/17/19  Yes [place], Peter M, MD  Multiple Vitamin (MULTIVITAMIN WITH MINERALS) TABS tablet Take 1 tablet by mouth daily.   Yes [provider]  pantoprazole (PROTONIX) 40 MG tablet Take 40 mg by mouth daily. 01/31/21  Yes [provider]  perphenazine (TRILAFON) 2 MG tablet Take 2 mg by mouth 3 (three) times a week. 12/16/20  Yes [provider]  prednisoLONE acetate (PRED FORTE) 1 % ophthalmic suspension Place 1 drop into the right eye daily.   Yes [provider]  psyllium (METAMUCIL) 58.6 % powder Take 1 packet by mouth daily.   Yes [provider]  rosuvastatin (CRESTOR) 5 MG tablet Take 5 mg by mouth every Monday, Wednesday,  and Friday.   Yes [provider]  tamsulosin (FLOMAX) 0.4 MG CAPS capsule Take 0.4 mg by mouth daily. 04/05/22  Yes [provider]  traMADol (ULTRAM) 50 MG tablet Take 50 mg by mouth 3 (three) times daily as needed. 08/26/22  Yes [provider]  vitamin B-12 (CYANOCOBALAMIN) 1000 MCG tablet Take 1,000 mcg by mouth daily.   Yes [provider]    Current Facility-Administered Medications  Medication Dose Route Frequency Provider Last Rate Last Admin   amitriptyline (ELAVIL) tablet 25 mg  25 mg Oral QHS Small, [name] L, PA   25 mg at 08/29/22 2228   brimonidine (ALPHAGAN) 0.2 % ophthalmic solution 1 drop  1 drop Both Eyes BID Small, [name] L, PA   1 drop at 08/29/22 2232   calcium-vitamin D (OSCAL WITH D) 500-5 MG-MCG per tablet 1 tablet  1 tablet Oral Daily Small, [name] L, PA       Chlorhexidine Gluconate Cloth 2 % PADS 6 each  6 each Topical Daily Hosie Poisson, MD   6 each at 08/29/22 2100   cholecalciferol (VITAMIN D3) 25 MCG (1000 UNIT) tablet 1,000 Units  1,000 Units Oral Daily Small, [name] L, PA       cyanocobalamin (VITAMIN B12) tablet 1,000 mcg  1,000 mcg Oral Daily Small, [name] L, PA       dorzolamide-timolol (COSOPT) 2-0.5 % ophthalmic solution 1 drop  1 drop Both Eyes BID Small, [name] L, PA   1 drop at 08/29/22 2233   gabapentin (NEURONTIN) capsule 300 mg  300 mg Oral QID Small, [name] L, PA   300 mg at 08/29/22 2228   ipratropium (ATROVENT) 0.06 % nasal spray 2 spray  2 spray Each Nare BID Small, [name] L, PA   2 spray at 08/29/22 2231   latanoprost (XALATAN) 0.005 % ophthalmic solution 1 drop  1 drop Both Eyes QHS Small, [name] L, PA   1  drop at 08/29/22 2230   multivitamin with minerals tablet 1 tablet  1 tablet Oral Daily Small, [name] L, PA       Oral care mouth rinse  15 mL Mouth Rinse PRN Hosie Poisson, MD       pantoprazole (PROTONIX) injection 40 mg  40 mg Intravenous Q12H Tu, Ching T, DO       perphenazine (TRILAFON) tablet 2 mg  2 mg  Oral Once per day on Mon Wed Fri Small, [name] L, PA       prednisoLONE acetate (PRED FORTE) 1 % ophthalmic suspension 1 drop  1 drop Right Eye Daily Small, [name] L, PA       psyllium (HYDROCIL/METAMUCIL) 1 packet  1 packet Oral Daily Hosie Poisson, MD       rosuvastatin (CRESTOR) tablet 5 mg  5 mg Oral Q M,W,F Small, [name] L, PA       traMADol (ULTRAM) tablet 50 mg  50 mg Oral TID PRN Small, [name] L, PA   50 mg at 08/29/22 2228    Allergies as of 08/29/2022 - Review Complete 08/29/2022  Allergen Reaction Noted   Xarelto [rivaroxaban]  05/10/2021    Review of Systems  Constitutional:  Positive for chills, malaise/fatigue and weight loss. Negative for diaphoresis and fever.  HENT:  Positive for hearing loss (HOH baseline).   Eyes:        Legally blind  Respiratory:  Positive for shortness of breath. Negative for sputum production and wheezing.   Cardiovascular:  Positive for chest pain and palpitations. Negative for orthopnea, claudication, leg swelling and PND.  Gastrointestinal:  Positive for nausea. Negative for abdominal pain, blood in stool, constipation, diarrhea, heartburn, melena and vomiting.  Genitourinary:  Negative for hematuria.  Musculoskeletal:  Positive for back pain and myalgias.  Neurological:  Negative for focal weakness.  Psychiatric/Behavioral:  Negative for memory loss.       Physical Exam:  Vital signs in last 24 hours: Temp:  [97.3 F (36.3 C)-98.5 F (36.9 C)] 98.2 F (36.8 C) (01/03 0354) Pulse Rate:  [66-114] 111 (01/03 0405) Resp:  [14-29] 17 (01/03 0405) BP: (73-154)/(48-87) 95/64 (01/03 0405) SpO2:  [90 %-100 %] 98 % (01/03 0405) Weight:  [69.4 kg-77.1 kg] 69.4 kg (01/02 2044) Last BM Date : 08/29/22 Last BM recorded by nurses in past 5 days No data recorded  General:   Pleasant, well developed male in no acute distress but having a difficult time getting comfortable due to his back pain Head:  Normocephalic and atraumatic. HOH, dry mucous  membranes Eyes: sclerae anicteric,conjunctive pink  Heart: Irregular rhythm, tachycardic 120s to 130s while in the room, systolic murmur Pulm: Clear anteriorly; no wheezing Abdomen:  Soft, Obese AB, Active bowel sounds. No tenderness . Without guarding and Without rebound, No organomegaly appreciated. Extremities:  Without edema. Msk:  Symmetrical without gross deformities. Peripheral pulses intact.  Neurologic:  Alert and  oriented x4;  No focal deficits.  Skin:   Dry and intact without significant lesions or rashes. Psychiatric:  Cooperative. Normal mood and affect.  LAB RESULTS: Recent Labs    08/29/22 1307 08/29/22 2220 08/30/22 0312  WBC 7.7 7.4 6.5  HGB 8.6* 8.1* 8.0*  HCT 27.9* 26.3* 25.8*  PLT 166 163 170   BMET Recent Labs    08/29/22 1307  NA 133*  K 4.0  CL 98  CO2 25  GLUCOSE 178*  BUN 13  CREATININE 0.99  CALCIUM 8.6*   LFT No results for  input(s): "PROT", "ALBUMIN", "AST", "ALT", "ALKPHOS", "BILITOT", "BILIDIR", "IBILI" in the last 72 hours. PT/INR No results for input(s): "LABPROT", "INR" in the last 72 hours.  STUDIES: DG Chest 2 View  Result Date: 08/29/2022 CLINICAL DATA:  Shortness of breath EXAM: CHEST - 2 VIEW COMPARISON:  07/15/2010 chest radiograph and 10/04/2021 CT FINDINGS: Cardiomegaly and aortic valve replacement again noted. Elevation of LEFT hemidiaphragm and minimal LEFT basilar scarring/atelectasis again noted. There is no evidence of focal airspace disease, pulmonary edema, suspicious pulmonary nodule/mass, pleural effusion, or pneumothorax. No acute bony abnormalities are identified. IMPRESSION: Cardiomegaly without evidence of acute cardiopulmonary disease. Electronically Signed   By: Margarette Canada M.D.   On: 08/29/2022 13:34     Vladimir Crofts  08/30/2022, 8:04 AM

## 2022-08-30 NOTE — H&P (View-Only) (Signed)
Consultation  Referring Provider:   [facility] Primary Care Physician:  [name], MD Primary Gastroenterologist:  Dr. Henrene Pastor       Reason for Consultation: Acute on chronic symptomatic anemia, FOBT positive      Impression    GI bleed 89 year old male on Eliquis 2.5 mg last dose 01/02 in the morning history of small bowel and gastric AVMs 12/05/15 colon cancer 12-04-2016 status post right colectomy, repeat 2017/12/04 no recurrence 09/2021 CT abdomen pelvis, unable to see report, but no metastatic disease. presents with acute on chronic anemia, FOBT + hemoglobin 8.6 on admission compared to 10 on 06/30/2022, had 2 IV iron transfusions with PCP at that time and 12.1 on 03/20/2022 and 14.6 on 10/31/2021. initially some hypotension responding to IV fluids, currently hemodynamically stable Eliquis on hold. No elevation of BUN HGB 8.0 MCV 86.6 Platelets 170 BUN 13 Cr 0.99   Acute on chronic symptomatic anemia Eliquis on hold, FOBT + 08/30/2022  HGB 8.0 MCV 86.6 Platelets 170 No anemia studies, previously 7 units PRBC 2016/12/04,  was getting IV iron with hematology but has been stable since December 04, 2016  Elevated troponin with some chest pain and worsening shortness of breath last 2 to 3 months Troponin 78, history of CAD status post CABG Possibly related to demand ischemia, also some RVR with his A-fib  Atrial fibrillation On Eliquis 2.5 mg twice daily last dose 01/02 Currently on hold. Some RVR while in the room  Hypotension Likely from acute on chronic anemia, responded to IV fluids.  Urethral cell carcinoma and prostate cancer status post TURBT and right ureteral stent (12/2021) S/p treatment 09/2021 bone scan without mets  Degenerative disc disease with spinal stenosis MRI lumbar spine without contrast 08/07/2022 Suspect following up with neurosurgery No signs of metastasis.  Principal Problem:   GI bleed Active Problems:   OSA (obstructive sleep apnea)   Atrial fibrillation, chronic (HCC)    Anemia associated with acute blood loss   Hx of CABG   Hypotension    LOS: 1 day     Plan   Acute on chronic anemia, FOBT positive, patient on Eliquis with history of gastric and small bowel AVMs as well as history of colon cancer status post hemicolectomy 2016/12/04, last colon Dec 04, 2017. Last dose Eliquis 08/29/2022 No family at bedside, patient was in pain with his back, discussed likely having some sort of chronic slow bleed since has been going on since at least November. Last screening CT was 09/2021.  Will discuss further with Dr. Rush Landmark, the patient is not willing to undergo colonoscopy at this time due to his back pain and age with the bowel prep We can repeat screening CT abdomen and pelvis with and without contrast, and evaluate with enteroscopy.  However if this is negative will need to consider repeating colonoscopy. With patient's recent shortness of breath and chest pain and cardiac history with RVR in the room, will also request cardiac clearance prior to endoscopic evaluation.  Spoke with cardiology and they have given clearance for the patient to pursue endoscopic evaluation with normal echo and current EKG.  Will get anemia studies, continue supportive care with IV iron as needed. --Continue to monitor H&H with transfusion as needed to maintain hemoglobin greater than 8 given cardiac history. -Further recommendations per Dr. Rush Landmark  Thank you for your kind consultation, we will continue to follow.         HPI:   Jason [name] is a 89 y.o.  male with past medical history significant for CAD status post CABG with AVR, chronic A-fib on Eliquis, OSA on CPAP, urothelial carcinoma s/p TURBT and right ureteral stent (12/2021), prostate cancer on surveillance, history of IDA with gastric and small bowel AVMs, history of colon cancer status post right hemicolectomy 2018 presents with fatigue, found to have acute on chronic symptomatic anemia.  08/2014 EGD for melena, anemia.  Reached to D2.  APC ablation of proximal gastric AVMs.  O/w unremarkable study.  02/2016 Capsule Endoscopy:  [name] gland hyperplasia.  A few, scattered, SB  AVMs 09/06/2016 colonoscopy for IDA and abnormal CT showed malignant appearing tumor in colon, 1 benign 4 mm polyp in rectum and moderate diverticulosis left colon 10/24/2016 robotic proximal right colectomy with Dr. Johney Maine showed invasive well-differentiated adenocarcinoma 6.2 cm.  Patient was lymph node positive had 8 cycles of Xeloda 03/11/2018 colonoscopy for surveillance of colon cancer 3 mm polyp descending colon multiple small and large mouth diverticula transverse and left colon tubular adenoma no recall due to age. Continues to follow with Dr. Benay Spice last [place] was 10/13/2021.  10/04/2021 CT unable to see without evidence of metastatic disease, nodular enhancing area right UVJ and irregularity left bladder base.  [facility]ives at [facility] independently with wife.  Legally blind.  Earlier this year patient was active going to the gym at least twice weekly doing aerobic exercise and weights denying chest pain dyspnea palpitations. In November he saw cardiology for worsening fatigue and shortness of breath, found to have hemoglobin of 10 at that time down from 14.9.  Per patient and cardiology notes had 2 IV iron transfusions at that time.  Patient states he was not on oral iron that he knows of. Had subsequent echocardiogram 08/18/2022 with previous AVR unremarkable, EF 60-65%, no wall motion abnormalities.   Patient was arranged to have stress test however presented to the ER prior to that. Presented to the ER with 3 months of increasing fatigue and shortness of breath. Patient dates has been having left-sided chest pain sharp, acute not to still associate with exertion, worsening shortness of breath with exertion 2 to 3 months and extreme fatigue.  States he is also been dealing with lower back pain and has had decreased weight due to the  combination of fatigue and his back.  States he has not had much of an appetite. He is also had some nausea but no vomiting.  Denies any reflux dysphagia abdominal pain. Has had chills but no fever.  Has baseline of alternating constipation diarrhea, denies hematochezia or melena though patient is legally blind unable to see this, wife states he has not had any.  However in the ER patient had FOBT positive with the red blood. Patient is on Eliquis 2.5 mg twice daily last dose was 01/02 in the morning.  Patient did have hypotension in the ER SBP 70s, responsive after 1 L bolus. Labs in the ER showed positive FOBT, hemoglobin 8.6 compared to 10 on 06/30/2022 and 12.1 on 03/20/2022 and 14.6 on 10/31/2021.  MCV 87.2, platelets 166, sodium 133, glucose 178, calcium 8.6 troponin 78 and I do not see a repeat. 10/13/2021 CEA 7.37, continued mild elevation. This morning hemoglobin 8.0.   Abnormal ED labs: Abnormal Labs Reviewed  BASIC METABOLIC PANEL - Abnormal; Notable for the following components:      Result Value   Sodium 133 (*)    Glucose, Bld 178 (*)    Calcium 8.6 (*)    All other components within  normal limits  CBC - Abnormal; Notable for the following components:   RBC 3.20 (*)    Hemoglobin 8.6 (*)    HCT 27.9 (*)    RDW 19.3 (*)    All other components within normal limits  OCCULT BLOOD X 1 CARD TO LAB, STOOL - Abnormal; Notable for the following components:   Fecal Occult Bld POSITIVE (*)    All other components within normal limits  CBC - Abnormal; Notable for the following components:   RBC 2.98 (*)    Hemoglobin 8.0 (*)    HCT 25.8 (*)    RDW 19.6 (*)    All other components within normal limits  CBC - Abnormal; Notable for the following components:   RBC 3.03 (*)    Hemoglobin 8.1 (*)    HCT 26.3 (*)    RDW 19.6 (*)    All other components within normal limits  TROPONIN I (HIGH SENSITIVITY) - Abnormal; Notable for the following components:   Troponin I (High  Sensitivity) 78 (*)    All other components within normal limits     Past Medical History:  Diagnosis Date   Abnormal PSA    Adenomatous polyp of colon 2010 and before 2006   Anemia    Anxiety    Aortic stenosis    Aortic valve prosthesis present    Atrial fibrillation (HCC)    CAD (coronary artery disease)    DDD (degenerative disc disease), lumbar    Depression    Diverticulosis    Dyslipidemia    GERD (gastroesophageal reflux disease)    Glaucoma    History of blood transfusion 09/29/2016   History of kidney stones    Hx of CABG    Hypogonadism, male    Macular degeneration    OSA (obstructive sleep apnea)    Osteoarthritis    Osteopenia    Prostate CA ([place]) prostate 2007   colon dx 2018, watching psa levels, no tx yet for prostate    Surgical History:  He  has a past surgical history that includes Aortic valve replacement (October 2011); Cataract extraction (Bilateral); Corneal transplant (Right); Esophagogastroduodenoscopy (N/A, 09/16/2014); Back surgery (1978); Coronary artery bypass graft (October 2011); Tonsillectomy (76 yrs ago); and Transurethral resection of bladder tumor (Right, 01/04/2022). Family History:  His family history includes Heart attack in his father. Social History:   reports that he quit smoking about 31 years ago. His smoking use included cigarettes. He has a 40.00 pack-year smoking history. He has never used smokeless tobacco. He reports current alcohol use of about 2.0 standard drinks of alcohol per week. He reports that he does not use drugs.  Prior to Admission medications   Medication Sig Start Date End Date Taking? Authorizing Provider  acetaminophen (TYLENOL) 500 MG tablet Take 1,000 mg by mouth in the morning and at bedtime.   Yes [provider]  amitriptyline (ELAVIL) 25 MG tablet Take 25 mg by mouth at bedtime.   Yes [provider]  apixaban (ELIQUIS) 5 MG TABS tablet Take 2.5 mg by mouth 2 (two) times daily.   Yes  [provider]  brimonidine (ALPHAGAN) 0.2 % ophthalmic solution Place 1 drop into both eyes in the morning and at bedtime. 07/24/18  Yes [provider]  Calcium Carbonate-Vitamin D (CALCIUM-VITAMIN D) 500-200 MG-UNIT per tablet Take 1 tablet by mouth daily.   Yes [provider]  Cholecalciferol 1000 UNITS capsule Take 1,000 Units by mouth daily.    Yes [provider]  diclofenac Sodium (VOLTAREN) 1 % GEL Apply 1 application. topically in the morning and at bedtime.   Yes [provider]  dorzolamide-timolol (COSOPT) 22.3-6.8 MG/ML ophthalmic solution Place 1 drop into both eyes 2 (two) times daily. 11/07/18  Yes [provider]  gabapentin (NEURONTIN) 300 MG capsule Take 300 mg by mouth 4 (four) times daily.   Yes [provider]  ipratropium (ATROVENT) 0.06 % nasal spray Place 2 sprays into both nostrils in the morning and at bedtime. 11/10/21  Yes [provider]  latanoprost (XALATAN) 0.005 % ophthalmic solution Place 1 drop into both eyes daily. 05/16/15  Yes [provider]  metoprolol tartrate (LOPRESSOR) 25 MG tablet Take 1 tablet (25 mg total) by mouth 2 (two) times daily. Patient taking differently: Take 12.5 mg by mouth 2 (two) times daily. 02/17/19  Yes Martin[place], Peter M, MD  Multiple Vitamin (MULTIVITAMIN WITH MINERALS) TABS tablet Take 1 tablet by mouth daily.   Yes [provider]  pantoprazole (PROTONIX) 40 MG tablet Take 40 mg by mouth daily. 01/31/21  Yes [provider]  perphenazine (TRILAFON) 2 MG tablet Take 2 mg by mouth 3 (three) times a week. 12/16/20  Yes [provider]  prednisoLONE acetate (PRED FORTE) 1 % ophthalmic suspension Place 1 drop into the right eye daily.   Yes [provider]  psyllium (METAMUCIL) 58.6 % powder Take 1 packet by mouth daily.   Yes [provider]  rosuvastatin (CRESTOR) 5 MG tablet Take 5 mg by mouth every Monday, Wednesday,  and Friday.   Yes [provider]  tamsulosin (FLOMAX) 0.4 MG CAPS capsule Take 0.4 mg by mouth daily. 04/05/22  Yes [provider]  traMADol (ULTRAM) 50 MG tablet Take 50 mg by mouth 3 (three) times daily as needed. 08/26/22  Yes [provider]  vitamin B-12 (CYANOCOBALAMIN) 1000 MCG tablet Take 1,000 mcg by mouth daily.   Yes [provider]    Current Facility-Administered Medications  Medication Dose Route Frequency Provider Last Rate Last Admin   amitriptyline (ELAVIL) tablet 25 mg  25 mg Oral QHS Small, [name] L, PA   25 mg at 08/29/22 2228   brimonidine (ALPHAGAN) 0.2 % ophthalmic solution 1 drop  1 drop Both Eyes BID Small, [name] L, PA   1 drop at 08/29/22 2232   calcium-vitamin D (OSCAL WITH D) 500-5 MG-MCG per tablet 1 tablet  1 tablet Oral Daily Small, [name] L, PA       Chlorhexidine Gluconate Cloth 2 % PADS 6 each  6 each Topical Daily Hosie Poisson, MD   6 each at 08/29/22 2100   cholecalciferol (VITAMIN D3) 25 MCG (1000 UNIT) tablet 1,000 Units  1,000 Units Oral Daily Small, [name] L, PA       cyanocobalamin (VITAMIN B12) tablet 1,000 mcg  1,000 mcg Oral Daily Small, [name] L, PA       dorzolamide-timolol (COSOPT) 2-0.5 % ophthalmic solution 1 drop  1 drop Both Eyes BID Small, [name] L, PA   1 drop at 08/29/22 2233   gabapentin (NEURONTIN) capsule 300 mg  300 mg Oral QID Small, [name] L, PA   300 mg at 08/29/22 2228   ipratropium (ATROVENT) 0.06 % nasal spray 2 spray  2 spray Each Nare BID Small, [name] L, PA   2 spray at 08/29/22 2231   latanoprost (XALATAN) 0.005 % ophthalmic solution 1 drop  1 drop Both Eyes QHS Small, [name] L, PA   1  drop at 08/29/22 2230   multivitamin with minerals tablet 1 tablet  1 tablet Oral Daily Small, [name] L, PA       Oral care mouth rinse  15 mL Mouth Rinse PRN Hosie Poisson, MD       pantoprazole (PROTONIX) injection 40 mg  40 mg Intravenous Q12H Tu, Ching T, DO       perphenazine (TRILAFON) tablet 2 mg  2 mg  Oral Once per day on Mon Wed Fri Small, [name] L, PA       prednisoLONE acetate (PRED FORTE) 1 % ophthalmic suspension 1 drop  1 drop Right Eye Daily Small, [name] L, PA       psyllium (HYDROCIL/METAMUCIL) 1 packet  1 packet Oral Daily Hosie Poisson, MD       rosuvastatin (CRESTOR) tablet 5 mg  5 mg Oral Q M,W,F Small, [name] L, PA       traMADol (ULTRAM) tablet 50 mg  50 mg Oral TID PRN Small, [name] L, PA   50 mg at 08/29/22 2228    Allergies as of 08/29/2022 - Review Complete 08/29/2022  Allergen Reaction Noted   Xarelto [rivaroxaban]  05/10/2021    Review of Systems  Constitutional:  Positive for chills, malaise/fatigue and weight loss. Negative for diaphoresis and fever.  HENT:  Positive for hearing loss (HOH baseline).   Eyes:        Legally blind  Respiratory:  Positive for shortness of breath. Negative for sputum production and wheezing.   Cardiovascular:  Positive for chest pain and palpitations. Negative for orthopnea, claudication, leg swelling and PND.  Gastrointestinal:  Positive for nausea. Negative for abdominal pain, blood in stool, constipation, diarrhea, heartburn, melena and vomiting.  Genitourinary:  Negative for hematuria.  Musculoskeletal:  Positive for back pain and myalgias.  Neurological:  Negative for focal weakness.  Psychiatric/Behavioral:  Negative for memory loss.       Physical Exam:  Vital signs in last 24 hours: Temp:  [97.3 F (36.3 C)-98.5 F (36.9 C)] 98.2 F (36.8 C) (01/03 0354) Pulse Rate:  [66-114] 111 (01/03 0405) Resp:  [14-29] 17 (01/03 0405) BP: (73-154)/(48-87) 95/64 (01/03 0405) SpO2:  [90 %-100 %] 98 % (01/03 0405) Weight:  [69.4 kg-77.1 kg] 69.4 kg (01/02 2044) Last BM Date : 08/29/22 Last BM recorded by nurses in past 5 days No data recorded  General:   Pleasant, well developed male in no acute distress but having a difficult time getting comfortable due to his back pain Head:  Normocephalic and atraumatic. HOH, dry mucous  membranes Eyes: sclerae anicteric,conjunctive pink  Heart: Irregular rhythm, tachycardic 120s to 130s while in the room, systolic murmur Pulm: Clear anteriorly; no wheezing Abdomen:  Soft, Obese AB, Active bowel sounds. No tenderness . Without guarding and Without rebound, No organomegaly appreciated. Extremities:  Without edema. Msk:  Symmetrical without gross deformities. Peripheral pulses intact.  Neurologic:  Alert and  oriented x4;  No focal deficits.  Skin:   Dry and intact without significant lesions or rashes. Psychiatric:  Cooperative. Normal mood and affect.  LAB RESULTS: Recent Labs    08/29/22 1307 08/29/22 2220 08/30/22 0312  WBC 7.7 7.4 6.5  HGB 8.6* 8.1* 8.0*  HCT 27.9* 26.3* 25.8*  PLT 166 163 170   BMET Recent Labs    08/29/22 1307  NA 133*  K 4.0  CL 98  CO2 25  GLUCOSE 178*  BUN 13  CREATININE 0.99  CALCIUM 8.6*   LFT No results for  input(s): "PROT", "ALBUMIN", "AST", "ALT", "ALKPHOS", "BILITOT", "BILIDIR", "IBILI" in the last 72 hours. PT/INR No results for input(s): "LABPROT", "INR" in the last 72 hours.  STUDIES: DG Chest 2 View  Result Date: 08/29/2022 CLINICAL DATA:  Shortness of breath EXAM: CHEST - 2 VIEW COMPARISON:  07/15/2010 chest radiograph and 10/04/2021 CT FINDINGS: Cardiomegaly and aortic valve replacement again noted. Elevation of LEFT hemidiaphragm and minimal LEFT basilar scarring/atelectasis again noted. There is no evidence of focal airspace disease, pulmonary edema, suspicious pulmonary nodule/mass, pleural effusion, or pneumothorax. No acute bony abnormalities are identified. IMPRESSION: Cardiomegaly without evidence of acute cardiopulmonary disease. Electronically Signed   By: Margarette Canada M.D.   On: 08/29/2022 13:34     Vladimir Crofts  08/30/2022, 8:04 AM

## 2022-08-30 NOTE — Telephone Encounter (Signed)

## 2022-08-30 NOTE — Telephone Encounter (Addendum)
Called patient regarding results. Left detailed message regarding results for patient. Letter mailed 1/3 to patient ----- Message from Warren Lacy, PA-C sent at 08/29/2022  3:00 PM EST ----- Normal heart squeeze with EF 60 to 65%. Aortic valve replacement with normal mean gradient and no aortic regurgitation. Mild mitral regurgitation. No significant heart failure or valve disease to explain shortness of breath.

## 2022-08-30 NOTE — TOC Progression Note (Signed)
Transition of Care [facility]) - Progression Note    Patient Details  Name: Jason [name] MRN: [id number] Date of Birth: 04/27/1926  [facility] of [facility]) CM/SW Contact  Henrietta Dine, RN Phone Number: 08/30/2022, 4:28 PM  Clinical Narrative:    Pt from [place]; legally blind; attempted to verify pt's residency and level of care; LVM for Mickel Baas, [facility]; awaiting return call.       Expected Discharge Plan and Services                                               Social Determinants of Health (SDOH) Interventions SDOH Screenings   Tobacco Use: Medium Risk (08/29/2022)    Readmission Risk Interventions     No data to display

## 2022-08-31 ENCOUNTER — Inpatient Hospital Stay (HOSPITAL_COMMUNITY): Payer: Medicare Other | Admitting: Anesthesiology

## 2022-08-31 ENCOUNTER — Encounter (HOSPITAL_COMMUNITY): Payer: Self-pay | Admitting: Internal Medicine

## 2022-08-31 ENCOUNTER — Encounter (HOSPITAL_COMMUNITY)
Admission: EM | Disposition: A | Discharge status: Skilled Nursing Facility | Payer: Self-pay | Source: Other Acute Inpatient Hospital | Attending: Internal Medicine

## 2022-08-31 ENCOUNTER — Other Ambulatory Visit (HOSPITAL_COMMUNITY): Payer: Medicare Other

## 2022-08-31 DIAGNOSIS — K259 Gastric ulcer, unspecified as acute or chronic, without hemorrhage or perforation: Secondary | ICD-10-CM

## 2022-08-31 DIAGNOSIS — Z8639 Personal history of other endocrine, nutritional and metabolic disease: Secondary | ICD-10-CM | POA: Diagnosis not present

## 2022-08-31 DIAGNOSIS — K449 Diaphragmatic hernia without obstruction or gangrene: Secondary | ICD-10-CM | POA: Diagnosis not present

## 2022-08-31 DIAGNOSIS — K31811 Angiodysplasia of stomach and duodenum with bleeding: Secondary | ICD-10-CM

## 2022-08-31 DIAGNOSIS — D509 Iron deficiency anemia, unspecified: Secondary | ICD-10-CM

## 2022-08-31 DIAGNOSIS — T18198A Other foreign object in esophagus causing other injury, initial encounter: Secondary | ICD-10-CM

## 2022-08-31 DIAGNOSIS — K317 Polyp of stomach and duodenum: Secondary | ICD-10-CM

## 2022-08-31 DIAGNOSIS — Z87891 Personal history of nicotine dependence: Secondary | ICD-10-CM

## 2022-08-31 DIAGNOSIS — I2510 Atherosclerotic heart disease of native coronary artery without angina pectoris: Secondary | ICD-10-CM

## 2022-08-31 DIAGNOSIS — K5520 Angiodysplasia of colon without hemorrhage: Secondary | ICD-10-CM

## 2022-08-31 DIAGNOSIS — D62 Acute posthemorrhagic anemia: Secondary | ICD-10-CM | POA: Diagnosis not present

## 2022-08-31 DIAGNOSIS — K222 Esophageal obstruction: Secondary | ICD-10-CM

## 2022-08-31 HISTORY — PX: HEMOSTASIS CLIP PLACEMENT: SHX6857

## 2022-08-31 HISTORY — PX: BIOPSY: SHX5522

## 2022-08-31 HISTORY — PX: SUBMUCOSAL TATTOO INJECTION: SHX6856

## 2022-08-31 HISTORY — PX: HOT HEMOSTASIS: SHX5433

## 2022-08-31 HISTORY — PX: ENTEROSCOPY: SHX5533

## 2022-08-31 LAB — CBC WITH DIFFERENTIAL/PLATELET
Abs Immature Granulocytes: 0.06 10*3/uL (ref 0.00–0.07)
Basophils Absolute: 0 10*3/uL (ref 0.0–0.1)
Basophils Relative: 0 %
Eosinophils Absolute: 0 10*3/uL (ref 0.0–0.5)
Eosinophils Relative: 0 %
HCT: 30 % — ABNORMAL LOW (ref 39.0–52.0)
Hemoglobin: 9.1 g/dL — ABNORMAL LOW (ref 13.0–17.0)
Immature Granulocytes: 1 %
Lymphocytes Relative: 8 %
Lymphs Abs: 0.7 10*3/uL (ref 0.7–4.0)
MCH: 26.5 pg (ref 26.0–34.0)
MCHC: 30.3 g/dL (ref 30.0–36.0)
MCV: 87.5 fL (ref 80.0–100.0)
Monocytes Absolute: 0.5 10*3/uL (ref 0.1–1.0)
Monocytes Relative: 6 %
Neutro Abs: 7.5 10*3/uL (ref 1.7–7.7)
Neutrophils Relative %: 85 %
Platelets: 208 10*3/uL (ref 150–400)
RBC: 3.43 MIL/uL — ABNORMAL LOW (ref 4.22–5.81)
RDW: 19.4 % — ABNORMAL HIGH (ref 11.5–15.5)
WBC: 8.8 10*3/uL (ref 4.0–10.5)
nRBC: 0 % (ref 0.0–0.2)

## 2022-08-31 LAB — BASIC METABOLIC PANEL
Anion gap: 8 (ref 5–15)
BUN: 12 mg/dL (ref 8–23)
CO2: 26 mmol/L (ref 22–32)
Calcium: 8.3 mg/dL — ABNORMAL LOW (ref 8.9–10.3)
Chloride: 97 mmol/L — ABNORMAL LOW (ref 98–111)
Creatinine, Ser: 0.92 mg/dL (ref 0.61–1.24)
GFR, Estimated: >60 mL/min (ref >60)
Glucose, Bld: 88 mg/dL (ref 70–99)
Potassium: 3.8 mmol/L (ref 3.5–5.1)
Sodium: 131 mmol/L — ABNORMAL LOW (ref 135–145)

## 2022-08-31 SURGERY — ENTEROSCOPY
Anesthesia: Monitor Anesthesia Care

## 2022-08-31 MED ORDER — SPOT INK MARKER SYRINGE KIT
PACK | SUBMUCOSAL | Status: DC | PRN
  Administered 2022-08-31: 1.5 mL via SUBMUCOSAL

## 2022-08-31 MED ORDER — PROPOFOL 10 MG/ML IV BOLUS
INTRAVENOUS | Status: AC
  Filled 2022-08-31: qty 20

## 2022-08-31 MED ORDER — PANTOPRAZOLE SODIUM 40 MG PO TBEC
40.0000 mg | DELAYED_RELEASE_TABLET | Freq: Two times a day (BID) | ORAL | Status: DC
  Administered 2022-08-31 – 2022-09-15 (×29): 40 mg via ORAL
  Filled 2022-08-31 (×29): qty 1

## 2022-08-31 MED ORDER — SPOT INK MARKER SYRINGE KIT
PACK | SUBMUCOSAL | Status: AC
  Filled 2022-08-31: qty 5

## 2022-08-31 MED ORDER — SUCRALFATE 1 GM/10ML PO SUSP
1.0000 g | Freq: Two times a day (BID) | ORAL | Status: DC
  Administered 2022-08-31 – 2022-09-15 (×29): 1 g via ORAL
  Filled 2022-08-31 (×29): qty 10

## 2022-08-31 MED ORDER — SODIUM CHLORIDE 0.9 % IV SOLN
250.0000 mg | Freq: Every day | INTRAVENOUS | Status: AC
  Administered 2022-08-31 – 2022-09-01 (×2): 250 mg via INTRAVENOUS
  Filled 2022-08-31 (×2): qty 20

## 2022-08-31 MED ORDER — PROPOFOL 500 MG/50ML IV EMUL
INTRAVENOUS | Status: AC
  Filled 2022-08-31: qty 50

## 2022-08-31 MED ORDER — SODIUM CHLORIDE 0.9 % IV SOLN: INTRAVENOUS | Status: DC

## 2022-08-31 MED ORDER — LIDOCAINE 2% (20 MG/ML) 5 ML SYRINGE
INTRAMUSCULAR | Status: DC | PRN
  Administered 2022-08-31: 20 mg via INTRAVENOUS

## 2022-08-31 MED ORDER — PHENYLEPHRINE 80 MCG/ML (10ML) SYRINGE FOR IV PUSH (FOR BLOOD PRESSURE SUPPORT)
PREFILLED_SYRINGE | INTRAVENOUS | Status: DC | PRN
  Administered 2022-08-31 (×7): 160 ug via INTRAVENOUS
  Administered 2022-08-31: 240 ug via INTRAVENOUS

## 2022-08-31 MED ORDER — HYDROCODONE-ACETAMINOPHEN 5-325 MG PO TABS
1.0000 | ORAL_TABLET | Freq: Four times a day (QID) | ORAL | Status: DC | PRN
  Administered 2022-08-31 – 2022-09-01 (×2): 1 via ORAL
  Filled 2022-08-31 (×2): qty 1

## 2022-08-31 MED ORDER — PROPOFOL 10 MG/ML IV BOLUS
INTRAVENOUS | Status: DC | PRN
  Administered 2022-08-31: 20 mg via INTRAVENOUS

## 2022-08-31 MED ORDER — LIDOCAINE 5 % EX PTCH
1.0000 | MEDICATED_PATCH | CUTANEOUS | Status: DC
  Administered 2022-08-31 – 2022-09-14 (×15): 1 via TRANSDERMAL
  Filled 2022-08-31 (×15): qty 1

## 2022-08-31 MED ORDER — PROPOFOL 500 MG/50ML IV EMUL
INTRAVENOUS | Status: DC | PRN
  Administered 2022-08-31: 75 ug/kg/min via INTRAVENOUS

## 2022-08-31 MED ORDER — LACTATED RINGERS IV SOLN: INTRAVENOUS | Status: DC

## 2022-08-31 NOTE — Op Note (Signed)
[facility] Patient Name: Jason [name] Procedure Date: 08/31/2022 MRN: [id number] Attending MD: Justice Britain , MD, [id number] Date of Birth: [birth year] CSN: [id number] Age: 89 Admit Type: Inpatient Procedure:                Small bowel enteroscopy Indications:              Iron deficiency anemia, Occult blood in stool,                            Personal history of malignant neoplasm of the GI                            tract, Angiodysplasia (of intestine) Providers:                Justice Britain, MD, Dulcy Fanny, William Dalton, Technician Referring MD:             Pricilla Riffle. Fuller Plan, MD, Inpatient Medical Service Medicines:                Monitored Anesthesia Care Complications:            No immediate complications. Estimated Blood Loss:     Estimated blood loss was minimal. Procedure:                Pre-Anesthesia Assessment:                           - Prior to the procedure, a History and Physical                            was performed, and patient medications and                            allergies were reviewed. The patient's tolerance of                            previous anesthesia was also reviewed. The risks                            and benefits of the procedure and the sedation                            options and risks were discussed with the patient.                            All questions were answered, and informed consent                            was obtained. Prior Anticoagulants: The patient has                            taken Eliquis (apixaban), last dose was 2 days  prior to procedure. ASA Grade Assessment: III - A                            patient with severe systemic disease. After                            reviewing the risks and benefits, the patient was                            deemed in satisfactory condition to undergo the                             procedure.                           After obtaining informed consent, the endoscope was                            passed under direct vision. Throughout the                            procedure, the patient's blood pressure, pulse, and                            oxygen saturations were monitored continuously. The                            [id number]-HQ190L ([id number] Olympus colonoscope was                            introduced through the mouth and advanced to the                            proximal jejunum. The small bowel enteroscopy was                            accomplished without difficulty. The patient                            tolerated the procedure. Scope In: Scope Out: Findings:      A small area of shelf-like deformity was found in the proximal esophagus       (25 cm).      At this area of the proximal esophagus, a small pill was found (this was       pushed gently into the stomach).      No other gross lesions were noted in the entire esophagus.      The Z-line was irregular and was found 37 cm from the incisors.      A 6 cm hiatal hernia was present.      Multiple non-bleeding superficial gastric ulcers with a clean ulcer base       (Forrest Class III) were found in the gastric fundus. The largest lesion       was 18 mm in largest dimension. Biopsies were taken with a cold forceps       for histology  and rule out dysplasia (this is not a normal area of       ulceration to be found).      A few small semi-sessile polyps were found in the gastric body -       consistent with fundic gland polyps were found.      Three small angiodysplastic lesions with no bleeding were found in the       stomach. Fulguration to ablate the lesion to prevent bleeding by argon       plasma was successful. On the lesion that required more APC to stop       oozing after treatment but eventually stopped oozing, one hemostatic       clip was successfully placed (MR conditional). Clip manufacturer:  [employer]. There was no bleeding at the end of the procedure.      Patchy moderately erythematous mucosa without bleeding was found in the       entire examined stomach. Biopsies were taken with a cold forceps for       histology and Helicobacter pylori testing.      A single angiodysplastic lesion with no bleeding was found in the third       portion of the duodenum. Fulguration to ablate the lesion to prevent       bleeding by argon plasma was successful.      Normal mucosa was found in the entire duodenum otherwise.      Normal mucosa was found in the visualized proximal jejunum. Area was       tattooed with an injection of Spot (carbon black) to demarcate the       distal extent of today's procedure. Impression:               - Shelf-like deformity (query osteophyte disease)                            found in the proximal esophagus with a pill in the                            region (pushed gently into stomach).                           - No other gross lesions in the entire esophagus.                           - Z-line irregular, 37 cm from the incisors.                           - 6 cm hiatal hernia.                           - Non-bleeding gastric ulcers with a clean ulcer                            base (Forrest Class III) found in the fundus                            (abnormal location). Biopsied.                           -  A few fundic gland polyps.                           - Three non-bleeding angiodysplastic lesions in the                            stomach. Treated with argon plasma coagulation                            (APC). Clip (MR conditional) was placed. Clip                            manufacturer: [employer].                           - Patchy moderate erythematous mucosa in the rest                            of the stomach. Biopsied.                           - A single non-bleeding angiodysplastic lesion in                             D3. Treated with argon plasma coagulation (APC).                           - Normal mucosa was found in the entire examined                            duodenum otherwise.                           - Normal mucosa was found in the visualized                            proximal jejunum. Tattooed distal extent of SBE                            today. Recommendation:           - The patient will be observed post-procedure,                            until all discharge criteria are met.                           - Return patient to hospital ward for ongoing care.                           - Advance diet as tolerated.                           - Increase PPI to 40 mg twice daily.                           -  Carafate 1 g twice daily for 1 month.                           - Recommend IV Iron infusion while inhouse. Then                            should continue as outpatient setting.                           - Follow up pathology.                           - If patient continues to have evidence of Iron                            Deficiency, then I recommend that patient strongly                            consider surveillance colonoscopy (would be due                            this year) though he and family are hesitant at his                            age and his other medical comorbidities but this                            would be next step to ensure no other etiology for                            IDA (or cancer recurrence - reassuring negative CT)                            before a VCE is reconsidered.                           - If anticoagulation is to be continued then would                            no restart until at least 72 hours from today (1/7                            PM).                           - The findings and recommendations were discussed                            with the patient.                           - The findings and recommendations were discussed  with the patient's family.                           - The findings and recommendations were discussed                            with the referring physician. Procedure Code(s):        --- Professional ---                           [phone], Small intestinal endoscopy, enteroscopy                            beyond second portion of duodenum, not including                            ileum; with biopsy, single or multiple                           44799, Unlisted procedure, small intestine Diagnosis Code(s):        --- Professional ---                           K22.2, Esophageal obstruction                           T18.198A, Other foreign object in esophagus causing                            other injury, initial encounter                           K22.89, Other specified disease of esophagus                           K44.9, Diaphragmatic hernia without obstruction or                            gangrene                           K25.9, Gastric ulcer, unspecified as acute or                            chronic, without hemorrhage or perforation                           K31.7, Polyp of stomach and duodenum                           K31.819, Angiodysplasia of stomach and duodenum                            without bleeding                           K31.89, Other diseases of stomach and duodenum  D50.9, Iron deficiency anemia, unspecified                           R19.5, Other fecal abnormalities                           Z85.00, Personal history of malignant neoplasm of                            unspecified digestive organ                           K55.20, Angiodysplasia of colon without hemorrhage CPT copyright 2022 American Medical Association. All rights reserved. The codes documented in this report are preliminary and upon coder review may  be revised to meet current compliance requirements. Justice Britain, MD 08/31/2022 1:52:13  PM Number of Addenda: 0

## 2022-08-31 NOTE — Progress Notes (Signed)
[facility]                                                                               Jason [name], is a 89 y.o. male, DOB - 1925/12/28, BDZ:[id number] Admit date - 08/29/2022    Outpatient Primary MD for the patient is [name], MD  LOS - 2  days    Brief summary    89 year old gentleman prior history of coronary artery disease s/p CABG, AVR, chronic atrial fibrillation on anticoagulation with Eliquis, obstructive sleep apnea on CPAP, bladder carcinoma s/p TURB, prostate cancer, history of GI bleed with presence of gastric and small bowel AVMs on endoscopy, history of colon cancer s/p right hemicolectomy presents with generalized weakness and fatigue and was found to have a hemoglobin of 8.6 from baseline of 13   Assessment & Plan    Assessment and Plan:  Acute blood loss anemia likely secondary to GI bleed from AVMs  Patient presented with a hemoglobin of 8 which has dropped from a baseline around 13 CT of the abdomen and pelvis with contrast does not show any metastatic disease in the abdomen GI on board: SBE done on 08/31/22 showed multiple AVMs in the stomach and 1 duodenal AVM which were ablated. Noted ulceration in the fundus of the stomach which was biopsied  Anemia panel showed iron 15, saturation 5 Started on IV iron supplements, plan to discharge on p.o. Holding Eliquis for now Continue Protonix 40 mg BID, start Carafate as per GI  Hx of CABG, AVR Patient currently denies any chest pain or shortness of breath. Anticoagulation on hold  Atrial fibrillation, chronic (HCC) Rate controlled with metoprolol 12.5 mg twice daily Eliquis on hold due to GI bleed  OSA (obstructive sleep apnea) On CPAP  Chronic back pain Patient is currently on tramadol for pain control.  Hyperlipidemia Continue with Crestor 5 mg 3 times a week     Pressure ulcer present on admission RN Pressure Injury Documentation: Pressure Injury 08/29/22 Vertebral column  Lower Stage 2 -  Partial thickness loss of dermis presenting as a shallow open injury with a red, pink wound bed without slough. two quarter sized pressure wounds over vertebra (Active)  08/29/22 1410  Location: Vertebral column  Location Orientation: Lower  Staging: Stage 2 -  Partial thickness loss of dermis presenting as a shallow open injury with a red, pink wound bed without slough.  Wound Description (Comments): two quarter sized pressure wounds over vertebra  Present on Admission: Yes  Dressing Type Foam - Lift dressing to assess site every shift 08/31/22 0800   Foam wound dressing in place    Estimated body mass index is 21.95 kg/m as calculated from the following:   Height as of this encounter: '5\' 10"'$  (1.778 m).   Weight as of this encounter: 69.4 kg.  Code Status: DNR DVT Prophylaxis:  SCDs Start: 08/29/22 2210   Level of Care: Level of care: Progressive Family Communication: NONE at bedside.   Disposition Plan:     Remains inpatient appropriate: Level of care  Procedures:  SBE  Consultants:   Gastroenterology.   Antimicrobials:   Anti-infectives (From admission, onward)  None        Medications  Scheduled Meds:  amitriptyline  25 mg Oral QHS   brimonidine  1 drop Both Eyes BID   calcium-vitamin D  1 tablet Oral Daily   Chlorhexidine Gluconate Cloth  6 each Topical Daily   cholecalciferol  1,000 Units Oral Daily   cyanocobalamin  1,000 mcg Oral Daily   dorzolamide-timolol  1 drop Both Eyes BID   gabapentin  300 mg Oral QID   ipratropium  2 spray Each Nare BID   latanoprost  1 drop Both Eyes QHS   metoprolol tartrate  12.5 mg Oral BID   multivitamin with minerals  1 tablet Oral Daily   pantoprazole  40 mg Oral BID AC   perphenazine  2 mg Oral Once per day on Mon Wed Fri   prednisoLONE acetate  1 drop Right Eye Daily   psyllium  1 packet Oral Daily   rosuvastatin  5 mg Oral Q M,W,F   sucralfate  1 g Oral BID   Continuous Infusions:  ferric  gluconate (FERRLECIT) IVPB 250 mg (08/31/22 0943)   PRN Meds:.metoprolol tartrate, mouth rinse, traMADol    Subjective:   Jason [name] denies any new complaints today, just reported some chronic mild back pain.    Objective:   Vitals:   08/31/22 1350 08/31/22 1355 08/31/22 1356 08/31/22 1600  BP: (!) 116/51   114/64  Pulse: 83 80 86   Resp: (!) '27 15 14 19  '$ Temp:   97.8 F (36.6 C)   TempSrc:   Temporal   SpO2: 99% 100% 100%   Weight:      Height:        Intake/Output Summary (Last 24 hours) at 08/31/2022 1617 Last data filed at 08/31/2022 1405 Gross per 24 hour  Intake 572.54 ml  Output 951 ml  Net -378.46 ml   Filed Weights   08/29/22 1305 08/29/22 2044  Weight: 77.1 kg 69.4 kg     Exam General: NAD  Cardiovascular: S1, S2 present Respiratory: CTAB Abdomen: Soft, nontender, nondistended, bowel sounds present Musculoskeletal: No bilateral pedal edema noted Skin: Normal Psychiatry: Normal mood       Data Reviewed:  I have personally reviewed following labs and imaging studies   CBC Lab Results  Component Value Date   WBC 8.8 08/31/2022   RBC 3.43 (L) 08/31/2022   HGB 9.1 (L) 08/31/2022   HCT 30.0 (L) 08/31/2022   MCV 87.5 08/31/2022   MCH 26.5 08/31/2022   PLT 208 08/31/2022   MCHC 30.3 08/31/2022   RDW 19.4 (H) 08/31/2022   LYMPHSABS 0.7 08/31/2022   MONOABS 0.5 08/31/2022   EOSABS 0.0 08/31/2022   BASOSABS 0.0 73/22/0254     Last metabolic panel Lab Results  Component Value Date   NA 131 (L) 08/31/2022   K 3.8 08/31/2022   CL 97 (L) 08/31/2022   CO2 26 08/31/2022   BUN 12 08/31/2022   CREATININE 0.92 08/31/2022   GLUCOSE 88 08/31/2022   GFRNONAA >60 08/31/2022   GFRAA >60 11/01/2016   CALCIUM 8.3 (L) 08/31/2022   PROT 7.5 04/11/2017   ALBUMIN 3.9 04/11/2017   LABGLOB 3.2 03/29/2016   BILITOT 0.91 04/11/2017   ALKPHOS 54 04/11/2017   AST 22 04/11/2017   ALT 19 04/11/2017   ANIONGAP 8 08/31/2022    CBG (last 3)  No  results for input(s): "GLUCAP" in the last 72 hours.    Coagulation Profile: No results for input(s): "INR", "PROTIME" in  the last 168 hours.   Radiology Studies: CT ABDOMEN PELVIS W CONTRAST  Result Date: 08/30/2022 CLINICAL DATA:  Metastatic disease evaluation, history of prostate cancer * Tracking Code: BO * EXAM: CT ABDOMEN AND PELVIS WITH CONTRAST TECHNIQUE: Multidetector CT imaging of the abdomen and pelvis was performed using the standard protocol following bolus administration of intravenous contrast. RADIATION DOSE REDUCTION: This exam was performed according to the departmental dose-optimization program which includes automated exposure control, adjustment of the mA and/or kV according to patient size and/or use of iterative reconstruction technique. CONTRAST:  141m OMNIPAQUE IOHEXOL 300 MG/ML  SOLN COMPARISON:  CT chest abdomen pelvis, 10/04/2021 FINDINGS: Lower chest: Cardiomegaly and coronary artery calcifications. Small bilateral pleural effusions. Hepatobiliary: No solid liver abnormality is seen. No gallstones, gallbladder wall thickening, or biliary dilatation. Pancreas: Unremarkable. No pancreatic ductal dilatation or surrounding inflammatory changes. Spleen: Normal in size without significant abnormality. Adrenals/Urinary Tract: Adrenal glands are unremarkable. Kidneys are normal, without renal calculi, solid lesion, or hydronephrosis. Bladder is unremarkable. Stomach/Bowel: Stomach is within normal limits. Status post right hemicolectomy and reanastomosis. No evidence of bowel wall thickening, distention, or inflammatory changes. Descending and sigmoid diverticulosis. Vascular/Lymphatic: Aortic atherosclerosis. No enlarged abdominal or pelvic lymph nodes. Reproductive: Prostatomegaly. Other: No abdominal wall hernia or abnormality. Unchanged focus of nonacute fat necrosis in the right lower quadrant (series 2, image 60). No ascites. Musculoskeletal: No acute osseous findings. Unchanged  severe, sclerotic disc degenerative disease and endplate destruction of L1-L2 (series 6, image 66). IMPRESSION: 1. No evidence of lymphadenopathy or metastatic disease in the abdomen or pelvis. 2. A previously described right ureterovesicular junction soft tissue nodule is no longer seen, possibly resected. 3. Status post right hemicolectomy and reanastomosis. 4. Descending and sigmoid diverticulosis without evidence of acute diverticulitis. 5. Prostatomegaly without discretely visualized mass. 6. Cardiomegaly and coronary artery disease. 7. Small bilateral pleural effusions. Aortic Atherosclerosis (ICD10-I70.0). Electronically Signed   By: ADelanna AhmadiM.D.   On: 08/30/2022 13:29       NAlma FriendlyM.D. [facility] 08/31/2022, 4:17 PM  Available via Epic secure chat 7am-7pm After 7 pm, please refer to night coverage provider listed on amion.

## 2022-08-31 NOTE — Anesthesia Procedure Notes (Signed)
Procedure Name: MAC Date/Time: 08/31/2022 1:03 PM  Performed by: Jenne Campus, CRNAPre-anesthesia Checklist: Patient identified, Emergency Drugs available, Suction available and Patient being monitored Oxygen Delivery Method: Simple face mask Placement Confirmation: positive ETCO2

## 2022-08-31 NOTE — Anesthesia Postprocedure Evaluation (Signed)
Anesthesia Post Note  Patient: Jason [name]  Procedure(s) Performed: ENTEROSCOPY SUBMUCOSAL TATTOO INJECTION BIOPSY HOT HEMOSTASIS (ARGON PLASMA COAGULATION/BICAP) HEMOSTASIS CLIP PLACEMENT     Patient location during evaluation: PACU Anesthesia Type: MAC Level of consciousness: awake and alert Pain management: pain level controlled Vital Signs Assessment: post-procedure vital signs reviewed and stable Respiratory status: spontaneous breathing, nonlabored ventilation, respiratory function stable and patient connected to nasal cannula oxygen Cardiovascular status: stable and blood pressure returned to baseline Postop Assessment: no apparent nausea or vomiting Anesthetic complications: no  No notable events documented.  Last Vitals:  Vitals:   08/31/22 1355 08/31/22 1356  BP:    Pulse: 80 86  Resp: 15 14  Temp:  36.6 C  SpO2: 100% 100%    Last Pain:  Vitals:   08/31/22 1356  TempSrc: Temporal  PainSc: 0-No pain                 Erendira Crabtree S

## 2022-08-31 NOTE — Interval H&P Note (Signed)
History and Physical Interval Note:  08/31/2022 12:26 PM  Jason [name]  has presented today for surgery, with the diagnosis of Anemia, history of gastric and small bowel AVM.  The various methods of treatment have been discussed with the patient and family. After consideration of risks, benefits and other options for treatment, the patient has consented to  Procedure(s): ENTEROSCOPY (N/A) as a surgical intervention.  The patient's history has been reviewed, patient examined, no change in status, stable for surgery.  I have reviewed the patient's chart and labs.  Questions were answered to the patient's satisfaction.     [employer]

## 2022-08-31 NOTE — TOC Initial Note (Signed)
Transition of Care [facility]) - Initial/Assessment Note    Patient Details  Name: Jason [name] MRN: [id number] Date of Birth: 08-05-1926  Transition of Care [facility]) CM/SW Contact:    Leeroy Cha, RN Phone Number: 08/31/2022, 7:43 AM  Clinical Narrative:                  Transition of Care [facility]) Screening Note   Patient Details  Name: Jason [name] Date of Birth: Dec 21, 1925   Transition of Care [facility]) CM/SW Contact:    Leeroy Cha, RN Phone Number: 08/31/2022, 7:43 AM    Transition of Care Department [facility]) has reviewed patient and no TOC needs have been identified at this time. We will continue to monitor patient advancement through interdisciplinary progression rounds. If new patient transition needs arise, please place a TOC consult.    Expected Discharge Plan: Assisted Living Barriers to Discharge: Continued Medical Work up   Patient Goals and CMS Choice Patient states their goals for this hospitalization and ongoing recovery are:: to go back to my wife CMS Medicare.gov Compare Post Acute Care list provided to:: Patient Choice offered to / list presented to : Patient      Expected Discharge Plan and Services   Discharge Planning Services: CM Consult   Living arrangements for the past 2 months: Apartment                                      Prior Living Arrangements/Services Living arrangements for the past 2 months: Apartment Lives with:: Spouse Patient language and need for interpreter reviewed:: Yes Do you feel safe going back to the place where you live?: Yes            Criminal Activity/Legal Involvement Pertinent to Current Situation/Hospitalization: No - Comment as needed  Activities of Daily Living      Permission Sought/Granted                  Emotional Assessment Appearance:: Appears stated age Attitude/Demeanor/Rapport: Gracious Affect (typically observed): Appropriate Orientation: : Oriented to Self,  Oriented to Place, Oriented to  Time, Oriented to Situation Alcohol / Substance Use: Never Used Psych Involvement: No (comment)  Admission diagnosis:  GI bleed [K92.2] Gastrointestinal hemorrhage, unspecified gastrointestinal hemorrhage type [K92.2] Anemia, unspecified type [D64.9] Patient Active Problem List   Diagnosis Date Noted   Heme positive stool 08/30/2022   AVM (arteriovenous malformation) of small bowel, acquired 08/30/2022   History of iron deficiency 08/30/2022   GI bleed 08/29/2022   Hx of CABG 08/29/2022   Hypotension 08/29/2022   Sensorineural hearing loss (SNHL) of both ears 06/28/2022   Legal blindness 06/28/2022   Dysphonia 06/28/2022   Paroxysmal atrial fibrillation ([place]) 06/28/2022   OSA on CPAP 06/28/2022   History of corneal transplant 12/30/2019   Exudative age-related macular degeneration of left eye with inactive choroidal neovascularization ([place]) 12/29/2019   Retinal hemorrhage of left eye 12/29/2019   Exudative age-related macular degeneration of right eye with inactive choroidal neovascularization ([place]) 12/29/2019   Advanced nonexudative age-related macular degeneration of both eyes with subfoveal involvement 12/29/2019   Optic nerve atrophy 12/29/2019   Primary open angle glaucoma of both eyes, severe stage 12/29/2019   Deficiency, internal organ 02/27/2017   Anemia associated with acute blood loss 11/01/2016   Lower GI bleed 10/27/2016   HOH (hard of hearing) 10/24/2016   Cecal cancer s/p robotic proximal  colectomy 10/24/2016 09/25/2016   RLQ abdominal pain 08/29/2016   Abnormal abdominal CT scan 08/29/2016   Chronic anticoagulation 08/29/2016   Anemia 08/22/2016   Iron deficiency anemia due to chronic blood loss 06/29/2016   Melena    Gastric AVM    GIB (gastrointestinal bleeding) 09/14/2014   Atrial fibrillation, chronic ([place]) 07/15/2014   PVC's (premature ventricular contractions) 12/05/2012   Fatigue 12/12/2011   Heart palpitations  01/24/2011   CAD (coronary artery disease)    Aortic stenosis    Dyslipidemia    Depression    Anxiety state 11/06/2008   Aortic valve disorder 11/06/2008   CONSTIPATION 11/06/2008   IDIOPATHIC OSTEOPOROSIS 11/06/2008   OSA (obstructive sleep apnea) 11/06/2008   HEMORRHOIDS 11/05/2008   DIVERTICULOSIS, COLON 11/05/2008   Personal history of colonic polyps 11/05/2008   PCP:  [name], MD Pharmacy:   [id number] [id number]: [phone]: [address] [address] [address]Phone: [phone]: [phone]    Social Determinants of Health (SDOH) Social History: SDOH Screenings   Tobacco Use: Medium Risk (08/29/2022)   SDOH Interventions:     Readmission Risk Interventions   No data to display

## 2022-08-31 NOTE — Anesthesia Preprocedure Evaluation (Addendum)
Anesthesia Evaluation  Patient identified by MRN, date of birth, ID band Patient awake    Reviewed: Allergy & Precautions, H&P , NPO status , Patient's Chart, lab work & pertinent test results  Airway Mallampati: II  TM Distance: >3 FB Neck ROM: Full    Dental no notable dental hx.    Pulmonary sleep apnea , former smoker   Pulmonary exam normal breath sounds clear to auscultation       Cardiovascular + CAD and + CABG  + dysrhythmias Atrial Fibrillation + Valvular Problems/Murmurs AS  Rhythm:Regular Rate:Normal + Systolic murmurs Normal heart squeeze with EF 60 to 65%. Aortic valve replacement with normal mean gradient and no aortic regurgitation. Mild mitral regurgitation.    Neuro/Psych negative neurological ROS  negative psych ROS   GI/Hepatic negative GI ROS, Neg liver ROS,,,  Endo/Other  negative endocrine ROS    Renal/GU negative Renal ROS  negative genitourinary   Musculoskeletal negative musculoskeletal ROS (+)    Abdominal   Peds negative pediatric ROS (+)  Hematology  (+) Blood dyscrasia, anemia   Anesthesia Other Findings   Reproductive/Obstetrics negative OB ROS                              Anesthesia Physical Anesthesia Plan  ASA: 4  Anesthesia Plan: MAC   Post-op Pain Management: Minimal or no pain anticipated   Induction: Intravenous  PONV Risk Score and Plan: 1 and Propofol infusion and Treatment may vary due to age or medical condition  Airway Management Planned: Simple Face Mask  Additional Equipment:   Intra-op Plan:   Post-operative Plan:   Informed Consent: I have reviewed the patients History and Physical, chart, labs and discussed the procedure including the risks, benefits and alternatives for the proposed anesthesia with the patient or authorized representative who has indicated his/her understanding and acceptance.     Dental advisory  given  Plan Discussed with: CRNA and Surgeon  Anesthesia Plan Comments:         Anesthesia Quick Evaluation

## 2022-08-31 NOTE — Transfer of Care (Signed)
Immediate Anesthesia Transfer of Care Note  Patient: Jason [name]  Procedure(s) Performed: ENTEROSCOPY SUBMUCOSAL TATTOO INJECTION BIOPSY HOT HEMOSTASIS (ARGON PLASMA COAGULATION/BICAP) HEMOSTASIS CLIP PLACEMENT  Patient Location: Endoscopy Unit  Anesthesia Type:MAC  Level of Consciousness: oriented, drowsy, and patient cooperative  Airway & Oxygen Therapy: Patient Spontanous Breathing and Patient connected to face mask oxygen  Post-op Assessment: Report given to RN and Post -op Vital signs reviewed and stable  Post vital signs: Reviewed  Last Vitals:  Vitals Value Taken Time  BP 97/56 08/31/22 1347  Temp    Pulse 83 08/31/22 1350  Resp 19 08/31/22 1351  SpO2 99 % 08/31/22 1350  Vitals shown include unvalidated device data.  Last Pain:  Vitals:   08/31/22 1219  TempSrc: Temporal  PainSc: 0-No pain         Complications: No notable events documented.

## 2022-08-31 NOTE — Progress Notes (Signed)
PT Cancellation Note  Patient Details Name: Jason [name] MRN: [id number] DOB: [birth year]   Cancelled Treatment:    Reason Eval/Treat Not Completed: Medical issues which prohibited therapy Patient has Endo scheduled today.  Will check back another time. [place] Office [phone] Weekend [phone]   Claretha Cooper 08/31/2022, 10:11 AM

## 2022-09-01 DIAGNOSIS — K5520 Angiodysplasia of colon without hemorrhage: Secondary | ICD-10-CM | POA: Diagnosis not present

## 2022-09-01 LAB — CBC WITH DIFFERENTIAL/PLATELET
Abs Immature Granulocytes: 0.03 10*3/uL (ref 0.00–0.07)
Basophils Absolute: 0 10*3/uL (ref 0.0–0.1)
Basophils Relative: 0 %
Eosinophils Absolute: 0 10*3/uL (ref 0.0–0.5)
Eosinophils Relative: 0 %
HCT: 26.9 % — ABNORMAL LOW (ref 39.0–52.0)
Hemoglobin: 8.3 g/dL — ABNORMAL LOW (ref 13.0–17.0)
Immature Granulocytes: 0 %
Lymphocytes Relative: 13 %
Lymphs Abs: 0.9 10*3/uL (ref 0.7–4.0)
MCH: 26.3 pg (ref 26.0–34.0)
MCHC: 30.9 g/dL (ref 30.0–36.0)
MCV: 85.4 fL (ref 80.0–100.0)
Monocytes Absolute: 0.7 10*3/uL (ref 0.1–1.0)
Monocytes Relative: 9 %
Neutro Abs: 5.4 10*3/uL (ref 1.7–7.7)
Neutrophils Relative %: 78 %
Platelets: 208 10*3/uL (ref 150–400)
RBC: 3.15 MIL/uL — ABNORMAL LOW (ref 4.22–5.81)
RDW: 19.2 % — ABNORMAL HIGH (ref 11.5–15.5)
WBC: 7 10*3/uL (ref 4.0–10.5)
nRBC: 0 % (ref 0.0–0.2)

## 2022-09-01 LAB — BASIC METABOLIC PANEL
Anion gap: 11 (ref 5–15)
BUN: 16 mg/dL (ref 8–23)
CO2: 23 mmol/L (ref 22–32)
Calcium: 7.8 mg/dL — ABNORMAL LOW (ref 8.9–10.3)
Chloride: 98 mmol/L (ref 98–111)
Creatinine, Ser: 0.9 mg/dL (ref 0.61–1.24)
GFR, Estimated: >60 mL/min (ref >60)
Glucose, Bld: 73 mg/dL (ref 70–99)
Potassium: 3.7 mmol/L (ref 3.5–5.1)
Sodium: 132 mmol/L — ABNORMAL LOW (ref 135–145)

## 2022-09-01 LAB — LACTIC ACID, PLASMA
Lactic Acid, Venous: 1.1 mmol/L (ref 0.5–1.9)
Lactic Acid, Venous: 1.3 mmol/L (ref 0.5–1.9)

## 2022-09-01 LAB — SURGICAL PATHOLOGY

## 2022-09-01 LAB — TISSUE TRANSGLUTAMINASE, IGA: Tissue Transglutaminase Ab, IgA: <2 U/mL (ref 0–3)

## 2022-09-01 MED ORDER — SODIUM CHLORIDE 0.9 % IV SOLN: INTRAVENOUS | Status: DC

## 2022-09-01 MED ORDER — OXYCODONE HCL 5 MG PO TABS
5.0000 mg | ORAL_TABLET | ORAL | Status: DC | PRN
  Administered 2022-09-01 – 2022-09-13 (×12): 5 mg via ORAL
  Filled 2022-09-01 (×14): qty 1

## 2022-09-01 MED ORDER — POLYETHYLENE GLYCOL 3350 17 G PO PACK
17.0000 g | PACK | Freq: Every day | ORAL | Status: DC
  Administered 2022-09-01 – 2022-09-12 (×8): 17 g via ORAL
  Filled 2022-09-01 (×11): qty 1

## 2022-09-01 MED ORDER — SENNOSIDES-DOCUSATE SODIUM 8.6-50 MG PO TABS
1.0000 | ORAL_TABLET | Freq: Every day | ORAL | Status: DC
  Administered 2022-09-01 – 2022-09-11 (×11): 1 via ORAL
  Filled 2022-09-01 (×11): qty 1

## 2022-09-01 MED ORDER — TRAMADOL HCL 50 MG PO TABS
100.0000 mg | ORAL_TABLET | Freq: Three times a day (TID) | ORAL | Status: DC | PRN
  Administered 2022-09-02 – 2022-09-15 (×15): 100 mg via ORAL
  Filled 2022-09-01 (×15): qty 2

## 2022-09-01 NOTE — Evaluation (Signed)
Physical Therapy Evaluation Patient Details Name: Jason [name] MRN: [id number] DOB: 11/19/1925 Today's Date: 09/01/2022  History of Present Illness  89 year old male admitted 08/29/22 for Acute blood loss anemia likely secondary to GI bleed from AVMs. PMHx: coronary artery disease s/p CABG, AVR, chronic atrial fibrillation on anticoagulation with Eliquis, obstructive sleep apnea on CPAP, bladder carcinoma s/p TURB, prostate cancer, GI bleed with presence of gastric and small bowel AVMs on endoscopy, colon cancer s/p right hemicolectomy  Clinical Impression  Pt admitted with above diagnosis.  Pt currently with functional limitations due to the deficits listed below (see PT Problem List). Pt will benefit from skilled PT to increase their independence and safety with mobility to allow discharge to the venue listed below.  Pt's spouse reports pt was mostly in his lift recliner a couple weeks prior to admission due to weakness and back pain.  Pt has chronic back pain and has been receiving steroids per spouse.  Pt with significant limitations with mobility today due to back pain and RN notified.  Spouse does not feel able to assist pt at home at current level and agreeable for SNF upon d/c.        Recommendations for follow up therapy are one component of a multi-disciplinary discharge planning process, led by the attending physician.  Recommendations may be updated based on patient status, additional functional criteria and insurance authorization.  Follow Up Recommendations Skilled nursing-short term rehab (<3 hours/day) Can patient physically be transported by private vehicle: No    Assistance Recommended at Discharge Intermittent Supervision/Assistance  Patient can return home with the following  A lot of help with walking and/or transfers;A lot of help with bathing/dressing/bathroom;Assistance with cooking/housework    Equipment Recommendations None recommended by PT  Recommendations for  Other Services       Functional Status Assessment Patient has had a recent decline in their functional status and demonstrates the ability to make significant improvements in function in a reasonable and predictable amount of time.     Precautions / Restrictions Precautions Precautions: Back;Fall Precaution Comments: back for comfort      Mobility  Bed Mobility Overal bed mobility: Needs Assistance Bed Mobility: Rolling, Sidelying to Sit Rolling: Min assist Sidelying to sit: Mod assist       General bed mobility comments: assist for positioning, pt utilized bed rails to self assist, assist for LEs over EOB and trunk upright; log roll technique used due to back pain    Transfers Overall transfer level: Needs assistance Equipment used: Rolling walker (2 wheels) Transfers: Sit to/from Stand Sit to Stand: Min assist           General transfer comment: assist to rise and steady, assist to guide hands for placement    Ambulation/Gait Ambulation/Gait assistance: Min assist Gait Distance (Feet): 12 Feet Assistive device: Rolling walker (2 wheels) Gait Pattern/deviations: Step-through pattern, Decreased stride length, Shuffle       General Gait Details: multimodal cues due to poor vision, limited distance due to back pain  Stairs            Wheelchair Mobility    Modified Rankin (Stroke Patients Only)       Balance Overall balance assessment: History of Falls                                           Pertinent Vitals/Pain Pain Assessment  Pain Assessment: 0-10 Pain Score: 8  Pain Location: low back Pain Descriptors / Indicators: Grimacing, Aching, Sore Pain Intervention(s): Monitored during session, Repositioned, Patient requesting pain meds-RN notified    Home Living       Type of Home: Independent living facility           Home Equipment: Rollator (4 wheels);Shower seat - built in      Prior Function Prior Level of  Function : Independent/Modified Independent             Mobility Comments: typically ambulating with rollator, spouse "leads" due to vision impairments, sleeps in lift recliner       Hand Dominance        Extremity/Trunk Assessment        Lower Extremity Assessment Lower Extremity Assessment: Generalized weakness       Communication   Communication: HOH  Cognition Arousal/Alertness: Awake/alert Behavior During Therapy: WFL for tasks assessed/performed Overall Cognitive Status: Within Functional Limits for tasks assessed                                          General Comments      Exercises     Assessment/Plan    PT Assessment Patient needs continued PT services  PT Problem List Decreased strength;Decreased mobility;Decreased activity tolerance;Decreased balance;Decreased knowledge of use of DME;Pain       PT Treatment Interventions DME instruction;Gait training;Therapeutic exercise;Balance training;Functional mobility training;Therapeutic activities;Patient/family education    PT Goals (Current goals can be found in the Care Plan section)  Acute Rehab PT Goals PT Goal Formulation: With patient/family Time For Goal Achievement: 09/15/22 Potential to Achieve Goals: Good    Frequency Min 2X/week     Co-evaluation               AM-PAC PT "6 Clicks" Mobility  Outcome Measure Help needed turning from your back to your side while in a flat bed without using bedrails?: A Lot Help needed moving from lying on your back to sitting on the side of a flat bed without using bedrails?: A Lot Help needed moving to and from a bed to a chair (including a wheelchair)?: A Lot Help needed standing up from a chair using your arms (e.g., wheelchair or bedside chair)?: A Lot Help needed to walk in hospital room?: A Lot Help needed climbing 3-5 steps with a railing? : Total 6 Click Score: 11    End of Session Equipment Utilized During Treatment:  Gait belt Activity Tolerance: Patient limited by pain Patient left: in chair;with call bell/phone within reach;with family/visitor present Nurse Communication: Mobility status;Patient requests pain meds (aware pt up in recliner, spouse in room, no chair alarm) PT Visit Diagnosis: Difficulty in walking, not elsewhere classified (R26.2);Muscle weakness (generalized) (M62.81)    Time: 7253-6644 PT Time Calculation (min) (ACUTE ONLY): 19 min   Charges:   PT Evaluation $PT Eval Low Complexity: 1 Low        Kati PT, DPT Physical Therapist Acute Rehabilitation Services Preferred contact method: Secure Chat Weekend Pager Only: [phone] Office: [place] 09/01/2022, 11:35 AM

## 2022-09-01 NOTE — Progress Notes (Signed)
[facility]                                                                               Jason [name], is a 89 y.o. male, DOB - [birth year], QIO:[id number] Admit date - 08/29/2022    Outpatient Primary MD for the patient is [name], MD  LOS - 3  days    Brief summary    89 year old gentleman prior history of coronary artery disease s/p CABG, AVR, chronic atrial fibrillation on anticoagulation with Eliquis, obstructive sleep apnea on CPAP, bladder carcinoma s/p TURB, prostate cancer, history of GI bleed with presence of gastric and small bowel AVMs on endoscopy, history of colon cancer s/p right hemicolectomy presents with generalized weakness and fatigue and was found to have a hemoglobin of 8.6 from baseline of 13   Assessment & Plan    Assessment and Plan:  Acute blood loss anemia likely secondary to GI bleed from AVMs  Patient presented with a hemoglobin of 8 which has dropped from a baseline around 13 CT of the abdomen and pelvis with contrast does not show any metastatic disease in the abdomen GI on board: SBE done on 08/31/22 showed multiple AVMs in the stomach and 1 duodenal AVM which were ablated. Noted ulceration in the fundus of the stomach which was biopsied  Anemia panel showed iron 15, saturation 5 S/p IV iron supplements, plan to discharge on p.o. Holding Eliquis for now Continue Protonix 40 mg BID, start Carafate as per GI  Hx of CABG, AVR Patient currently denies any chest pain or shortness of breath. Anticoagulation on hold  Atrial fibrillation, chronic (HCC) Rate controlled with metoprolol 12.5 mg twice daily Eliquis on hold due to GI bleed  OSA (obstructive sleep apnea) On CPAP  Chronic back pain Pain management Outpatient appointment with neurosurgery scheduled with Dr. Saintclair Halsted 1/11  Hyperlipidemia Continue with Crestor 5 mg 3 times a week     Pressure ulcer present on admission RN Pressure Injury Documentation: Pressure Injury  08/29/22 Vertebral column Lower Stage 2 -  Partial thickness loss of dermis presenting as a shallow open injury with a red, pink wound bed without slough. two quarter sized pressure wounds over vertebra (Active)  08/29/22 1410  Location: Vertebral column  Location Orientation: Lower  Staging: Stage 2 -  Partial thickness loss of dermis presenting as a shallow open injury with a red, pink wound bed without slough.  Wound Description (Comments): two quarter sized pressure wounds over vertebra  Present on Admission: Yes  Dressing Type Foam - Lift dressing to assess site every shift 09/01/22 0810   Foam wound dressing in place    Estimated body mass index is 21.95 kg/m as calculated from the following:   Height as of this encounter: '5\' 10"'$  (1.778 m).   Weight as of this encounter: 69.4 kg.  Code Status: DNR DVT Prophylaxis:  SCDs Start: 08/29/22 2210   Level of Care: Level of care: Progressive Family Communication: Wife at bedside.   Disposition Plan:     Remains inpatient appropriate: Level of care  Procedures:  SBE  Consultants:   Gastroenterology.   Antimicrobials:   Anti-infectives (From admission, onward)  None        Medications  Scheduled Meds:  amitriptyline  25 mg Oral QHS   brimonidine  1 drop Both Eyes BID   calcium-vitamin D  1 tablet Oral Daily   Chlorhexidine Gluconate Cloth  6 each Topical Daily   cholecalciferol  1,000 Units Oral Daily   cyanocobalamin  1,000 mcg Oral Daily   dorzolamide-timolol  1 drop Both Eyes BID   gabapentin  300 mg Oral QID   ipratropium  2 spray Each Nare BID   latanoprost  1 drop Both Eyes QHS   lidocaine  1 patch Transdermal Q24H   metoprolol tartrate  12.5 mg Oral BID   multivitamin with minerals  1 tablet Oral Daily   pantoprazole  40 mg Oral BID AC   perphenazine  2 mg Oral Once per day on Mon Wed Fri   polyethylene glycol  17 g Oral Daily   prednisoLONE acetate  1 drop Right Eye Daily   psyllium  1 packet Oral  Daily   rosuvastatin  5 mg Oral Q M,W,F   senna-docusate  1 tablet Oral QHS   sucralfate  1 g Oral BID   Continuous Infusions:   PRN Meds:.metoprolol tartrate, mouth rinse, oxyCODONE, traMADol    Subjective:   Jason [name] continues to complain of back pain.  Wife at bedside.  Denies any bleeding.    Objective:   Vitals:   09/01/22 1436 09/01/22 1444 09/01/22 1514 09/01/22 1641  BP: (!) 89/59 (!) 89/65 (!) 89/64 (!) 98/59  Pulse: 97  (!) 103 96  Resp:   18 15  Temp: 99.1 F (37.3 C)  99.3 F (37.4 C) 98.4 F (36.9 C)  TempSrc: Oral  Oral Oral  SpO2: 95%  95% 94%  Weight:      Height:        Intake/Output Summary (Last 24 hours) at 09/01/2022 1801 Last data filed at 09/01/2022 1634 Gross per 24 hour  Intake 100 ml  Output 850 ml  Net -750 ml   Filed Weights   08/29/22 1305 08/29/22 2044  Weight: 77.1 kg 69.4 kg     Exam General: NAD  Cardiovascular: S1, S2 present Respiratory: CTAB Abdomen: Soft, nontender, nondistended, bowel sounds present Musculoskeletal: No bilateral pedal edema noted Skin: Normal Psychiatry: Normal mood       Data Reviewed:  I have personally reviewed following labs and imaging studies   CBC Lab Results  Component Value Date   WBC 7.0 09/01/2022   RBC 3.15 (L) 09/01/2022   HGB 8.3 (L) 09/01/2022   HCT 26.9 (L) 09/01/2022   MCV 85.4 09/01/2022   MCH 26.3 09/01/2022   PLT 208 09/01/2022   MCHC 30.9 09/01/2022   RDW 19.2 (H) 09/01/2022   LYMPHSABS 0.9 09/01/2022   MONOABS 0.7 09/01/2022   EOSABS 0.0 09/01/2022   BASOSABS 0.0 24/26/8341     Last metabolic panel Lab Results  Component Value Date   NA 132 (L) 09/01/2022   K 3.7 09/01/2022   CL 98 09/01/2022   CO2 23 09/01/2022   BUN 16 09/01/2022   CREATININE 0.90 09/01/2022   GLUCOSE 73 09/01/2022   GFRNONAA >60 09/01/2022   GFRAA >60 11/01/2016   CALCIUM 7.8 (L) 09/01/2022   PROT 7.5 04/11/2017   ALBUMIN 3.9 04/11/2017   LABGLOB 3.2 03/29/2016   BILITOT  0.91 04/11/2017   ALKPHOS 54 04/11/2017   AST 22 04/11/2017   ALT 19 04/11/2017   ANIONGAP 11 09/01/2022  CBG (last 3)  No results for input(s): "GLUCAP" in the last 72 hours.    Coagulation Profile: No results for input(s): "INR", "PROTIME" in the last 168 hours.   Radiology Studies: No results found.     Alma Friendly M.D. [facility] 09/01/2022, 6:01 PM  Available via Epic secure chat 7am-7pm After 7 pm, please refer to night coverage provider listed on amion.

## 2022-09-01 NOTE — NC FL2 (Signed)
[place] LEVEL OF CARE FORM     IDENTIFICATION  Patient Name: Jason [name] Birthdate: 08-05-1926 Sex: male Admission Date (Current Location): 08/29/2022  [facility] and [place] Number:  Herbalist and Address:  [facility],  [address], [place]      Provider Number: [id number]  Attending Physician Name and Address:  Alma Friendly, MD  Relative Name and Phone Number:   Izora Gala Parrott(spouse)[phone])    Current Level of Care: Hospital Recommended Level of Care: [place] Prior Approval Number:    Date Approved/Denied:   PASRR Number:  [id number])  Discharge Plan: SNF    Current Diagnoses: Patient Active Problem List   Diagnosis Date Noted   Heme positive stool 08/30/2022   AVM (arteriovenous malformation) of small bowel, acquired 08/30/2022   History of iron deficiency 08/30/2022   GI bleed 08/29/2022   Hx of CABG 08/29/2022   Hypotension 08/29/2022   Sensorineural hearing loss (SNHL) of both ears 06/28/2022   Legal blindness 06/28/2022   Dysphonia 06/28/2022   Paroxysmal atrial fibrillation ([place]) 06/28/2022   OSA on CPAP 06/28/2022   History of corneal transplant 12/30/2019   Exudative age-related macular degeneration of left eye with inactive choroidal neovascularization ([place]) 12/29/2019   Retinal hemorrhage of left eye 12/29/2019   Exudative age-related macular degeneration of right eye with inactive choroidal neovascularization ([place]) 12/29/2019   Advanced nonexudative age-related macular degeneration of both eyes with subfoveal involvement 12/29/2019   Optic nerve atrophy 12/29/2019   Primary open angle glaucoma of both eyes, severe stage 12/29/2019   Deficiency, internal organ 02/27/2017   Anemia associated with acute blood loss 11/01/2016   Lower GI bleed 10/27/2016   HOH (hard of hearing) 10/24/2016   Cecal cancer s/p robotic proximal colectomy 10/24/2016 09/25/2016   RLQ  abdominal pain 08/29/2016   Abnormal abdominal CT scan 08/29/2016   Chronic anticoagulation 08/29/2016   Anemia 08/22/2016   Iron deficiency anemia due to chronic blood loss 06/29/2016   Melena    Gastric AVM    GIB (gastrointestinal bleeding) 09/14/2014   Atrial fibrillation, chronic ([place]) 07/15/2014   PVC's (premature ventricular contractions) 12/05/2012   Fatigue 12/12/2011   Heart palpitations 01/24/2011   CAD (coronary artery disease)    Aortic stenosis    Dyslipidemia    Depression    Anxiety state 11/06/2008   Aortic valve disorder 11/06/2008   CONSTIPATION 11/06/2008   IDIOPATHIC OSTEOPOROSIS 11/06/2008   OSA (obstructive sleep apnea) 11/06/2008   HEMORRHOIDS 11/05/2008   DIVERTICULOSIS, COLON 11/05/2008   Personal history of colonic polyps 11/05/2008    Orientation RESPIRATION BLADDER Height & Weight     Self, Time, Situation, Place  Normal Continent Weight: 69.4 kg Height:  '5\' 10"'$  (177.8 cm)  BEHAVIORAL SYMPTOMS/MOOD NEUROLOGICAL BOWEL NUTRITION STATUS      Continent Diet (Heart Healthy)  AMBULATORY STATUS COMMUNICATION OF NEEDS Skin   Limited Assist Verbally Other (Comment) (Pressure injury-lower back-foam dsg)                       Personal Care Assistance Level of Assistance  Bathing, Feeding, Dressing Bathing Assistance: Limited assistance Feeding assistance: Limited assistance Dressing Assistance: Limited assistance     Functional Limitations Info  Sight, Hearing, Speech Sight Info: Impaired (Legally blind) Hearing Info: Impaired (Bilateral Hearing aids) Speech Info: Adequate    SPECIAL CARE FACTORS FREQUENCY  PT (By licensed PT), OT (By licensed OT)  PT Frequency:  (5x week) OT Frequency:  (5x week)            Contractures Contractures Info: Not present    Additional Factors Info  Code Status, Allergies Code Status Info:  (DNR) Allergies Info:  (xarelto/rivaroxaban)           Current Medications (09/01/2022):  This is the  current hospital active medication list Current Facility-Administered Medications  Medication Dose Route Frequency Provider Last Rate Last Admin   amitriptyline (ELAVIL) tablet 25 mg  25 mg Oral QHS Hosie Poisson, MD   25 mg at 08/31/22 2206   brimonidine (ALPHAGAN) 0.2 % ophthalmic solution 1 drop  1 drop Both Eyes BID Hosie Poisson, MD   1 drop at 09/01/22 0941   calcium-vitamin D (OSCAL WITH D) 500-5 MG-MCG per tablet 1 tablet  1 tablet Oral Daily Hosie Poisson, MD   1 tablet at 09/01/22 0940   Chlorhexidine Gluconate Cloth 2 % PADS 6 each  6 each Topical Daily Hosie Poisson, MD   6 each at 08/31/22 1600   cholecalciferol (VITAMIN D3) 25 MCG (1000 UNIT) tablet 1,000 Units  1,000 Units Oral Daily Hosie Poisson, MD   1,000 Units at 09/01/22 0940   cyanocobalamin (VITAMIN B12) tablet 1,000 mcg  1,000 mcg Oral Daily Hosie Poisson, MD   1,000 mcg at 09/01/22 0940   dorzolamide-timolol (COSOPT) 2-0.5 % ophthalmic solution 1 drop  1 drop Both Eyes BID Hosie Poisson, MD   1 drop at 09/01/22 0941   gabapentin (NEURONTIN) capsule 300 mg  300 mg Oral QID Hosie Poisson, MD   300 mg at 09/01/22 1245   ipratropium (ATROVENT) 0.06 % nasal spray 2 spray  2 spray Each Nare BID Hosie Poisson, MD   2 spray at 09/01/22 0944   latanoprost (XALATAN) 0.005 % ophthalmic solution 1 drop  1 drop Both Eyes QHS Hosie Poisson, MD   1 drop at 08/31/22 1956   lidocaine (LIDODERM) 5 % 1 patch  1 patch Transdermal Q24H Raenette Rover, NP   1 patch at 08/31/22 1954   metoprolol tartrate (LOPRESSOR) injection 2.5 mg  2.5 mg Intravenous Q8H PRN Hosie Poisson, MD       metoprolol tartrate (LOPRESSOR) tablet 12.5 mg  12.5 mg Oral BID Hosie Poisson, MD   12.5 mg at 09/01/22 9528   multivitamin with minerals tablet 1 tablet  1 tablet Oral Daily Hosie Poisson, MD   1 tablet at 09/01/22 4132   Oral care mouth rinse  15 mL Mouth Rinse PRN Hosie Poisson, MD   15 mL at 08/30/22 2202   oxyCODONE (Oxy IR/ROXICODONE) immediate release tablet  5 mg  5 mg Oral Q4H PRN Alma Friendly, MD       pantoprazole (PROTONIX) EC tablet 40 mg  40 mg Oral BID AC Mansouraty, Telford Nab., MD   40 mg at 09/01/22 0805   perphenazine (TRILAFON) tablet 2 mg  2 mg Oral Once per day on Mon Wed Fri Akula, Jeoffrey Massed, MD   2 mg at 08/30/22 1029   polyethylene glycol (MIRALAX / GLYCOLAX) packet 17 g  17 g Oral Daily Alma Friendly, MD   17 g at 09/01/22 1241   prednisoLONE acetate (PRED FORTE) 1 % ophthalmic suspension 1 drop  1 drop Right Eye Daily Hosie Poisson, MD   1 drop at 09/01/22 1239   psyllium (HYDROCIL/METAMUCIL) 1 packet  1 packet Oral Daily Hosie Poisson, MD   1 packet at 09/01/22 0941   rosuvastatin (  CRESTOR) tablet 5 mg  5 mg Oral Q M,W,F Hosie Poisson, MD   5 mg at 09/01/22 3543   senna-docusate (Senokot-S) tablet 1 tablet  1 tablet Oral QHS Alma Friendly, MD       sucralfate (CARAFATE) 1 GM/10ML suspension 1 g  1 g Oral BID Mansouraty, Telford Nab., MD   1 g at 09/01/22 0940   traMADol (ULTRAM) tablet 100 mg  100 mg Oral Q8H PRN Alma Friendly, MD         Discharge Medications: Please see discharge summary for a list of discharge medications.  Relevant Imaging Results:  Relevant Lab Results:   Additional Information  (ss#[phone])  Kippy Melena, Juliann Pulse, RN

## 2022-09-01 NOTE — TOC Progression Note (Addendum)
Transition of Care [facility]) - Progression Note    Patient Details  Name: Jason [name] MRN: [id number] Date of Birth: February 01, 1926  Transition [facility] CM/SW Contact  Katrinia Straker, Juliann Pulse, RN Phone Number: 09/01/2022, 11:08 AM  Clinical Narrative: Left vm w/[facility] rep Vania Rea to confirm from there [place] level per spouse Izora Gala & d/c plan is for rehab to go to [facility] [place](left vm w/Katie) await call back to confirm all of d/c plans. Await PT recc. Izora Gala spouse in agreement.patient legally blind, bilateral hearing aids, continent B/B, lower back wound-dsg. Uses rw.Await confirmation & recc prior fl2.  -1:33p-confirmed patient from [place] to [place] @   [place] fax to them for [place] [facility].     Expected Discharge Plan: [place] Barriers to Discharge: Continued Medical Work up  Expected Discharge Plan and Services   Discharge Planning Services: CM Consult   Living arrangements for the past 2 months: [place], Apartment                                       Social Determinants of Health (SDOH) Interventions SDOH Screenings   Tobacco Use: Medium Risk (08/31/2022)    Readmission Risk Interventions     No data to display

## 2022-09-02 ENCOUNTER — Encounter: Payer: Self-pay | Admitting: Gastroenterology

## 2022-09-02 ENCOUNTER — Inpatient Hospital Stay (HOSPITAL_COMMUNITY): Payer: Medicare Other

## 2022-09-02 DIAGNOSIS — K31811 Angiodysplasia of stomach and duodenum with bleeding: Secondary | ICD-10-CM | POA: Diagnosis not present

## 2022-09-02 DIAGNOSIS — Z8639 Personal history of other endocrine, nutritional and metabolic disease: Secondary | ICD-10-CM | POA: Diagnosis not present

## 2022-09-02 DIAGNOSIS — D62 Acute posthemorrhagic anemia: Secondary | ICD-10-CM | POA: Diagnosis not present

## 2022-09-02 DIAGNOSIS — K5520 Angiodysplasia of colon without hemorrhage: Secondary | ICD-10-CM | POA: Diagnosis not present

## 2022-09-02 LAB — BLOOD CULTURE ID PANEL (REFLEXED) - BCID2

## 2022-09-02 LAB — CBC WITH DIFFERENTIAL/PLATELET
Abs Immature Granulocytes: 0.03 10*3/uL (ref 0.00–0.07)
Basophils Absolute: 0 10*3/uL (ref 0.0–0.1)
Basophils Relative: 0 %
Eosinophils Absolute: 0 10*3/uL (ref 0.0–0.5)
Eosinophils Relative: 0 %
HCT: 26.2 % — ABNORMAL LOW (ref 39.0–52.0)
Hemoglobin: 7.9 g/dL — ABNORMAL LOW (ref 13.0–17.0)
Immature Granulocytes: 0 %
Lymphocytes Relative: 12 %
Lymphs Abs: 0.8 10*3/uL (ref 0.7–4.0)
MCH: 26.2 pg (ref 26.0–34.0)
MCHC: 30.2 g/dL (ref 30.0–36.0)
MCV: 87 fL (ref 80.0–100.0)
Monocytes Absolute: 0.7 10*3/uL (ref 0.1–1.0)
Monocytes Relative: 10 %
Neutro Abs: 5.6 10*3/uL (ref 1.7–7.7)
Neutrophils Relative %: 78 %
Platelets: 224 10*3/uL (ref 150–400)
RBC: 3.01 MIL/uL — ABNORMAL LOW (ref 4.22–5.81)
RDW: 19.1 % — ABNORMAL HIGH (ref 11.5–15.5)
WBC: 7.2 10*3/uL (ref 4.0–10.5)
nRBC: 0 % (ref 0.0–0.2)

## 2022-09-02 LAB — IGA: IgA: 301 mg/dL (ref 61–437)

## 2022-09-02 MED ORDER — SODIUM CHLORIDE 0.9 % IV SOLN
2.0000 g | INTRAVENOUS | Status: DC
  Administered 2022-09-02 – 2022-09-04 (×3): 2 g via INTRAVENOUS
  Filled 2022-09-02 (×3): qty 20

## 2022-09-02 MED ORDER — GUAIFENESIN 100 MG/5ML PO LIQD
5.0000 mL | ORAL | Status: DC | PRN
  Administered 2022-09-02 – 2022-09-07 (×7): 5 mL via ORAL
  Filled 2022-09-02 (×7): qty 10

## 2022-09-02 MED ORDER — SODIUM CHLORIDE 0.9 % IV SOLN
250.0000 mg | Freq: Every day | INTRAVENOUS | Status: AC
  Administered 2022-09-02 – 2022-09-03 (×2): 250 mg via INTRAVENOUS
  Filled 2022-09-02 (×3): qty 20

## 2022-09-02 NOTE — Progress Notes (Signed)
Pt had one occurrence of bright red blood in stool before being transported to [facility] for MRI.  Pt was stable during transport and procedure.  Vitals stable upon return.  Will continue to monitor.

## 2022-09-02 NOTE — Progress Notes (Signed)
[facility]                                                                               Jason [name], is a 89 y.o. male, DOB - [birth year], UDJ:[id number] Admit date - 08/29/2022    Outpatient Primary MD for the patient is Jason Infante, MD  LOS - 4  days    Brief summary    89 year old gentleman prior history of coronary artery disease s/p CABG, AVR, chronic atrial fibrillation on anticoagulation with Eliquis, obstructive sleep apnea on CPAP, bladder carcinoma s/p TURB, prostate cancer, history of GI bleed with presence of gastric and small bowel AVMs on endoscopy, history of colon cancer s/p right hemicolectomy presents with generalized weakness and fatigue and was found to have a hemoglobin of 8.6 from baseline of 13   Assessment & Plan    Assessment and Plan:  Acute blood loss anemia likely secondary to GI bleed from AVMs  Patient presented with a hemoglobin of 8 which has dropped from a baseline around 13 CT of the abdomen and pelvis with contrast does not show any metastatic disease in the abdomen GI on board: SBE done on 08/31/22 showed multiple AVMs in the stomach and 1 duodenal AVM which were ablated. Noted ulceration in the fundus of the stomach which was biopsied  Anemia panel showed iron 15, saturation 5 On IV iron supplements X 4 doses, plan to discharge on p.o. Holding Eliquis for now Continue Protonix 40 mg BID, start Carafate as per GI  Strep bacteremia Likely from ??discitis/epidural abscess, reports significant back pain  Noted persistent hypotension, SBP's in the 80s Afebrile, with no leukocytosis LA WNL BC X 2, now growing strep species Started gentle IV fluids Started on IV ceftriaxone Initial MRI lumbar spine done 07/2022 showed severe endplate degeneration at L1-2 with some surrounding soft tissue edema, findings are favored to be degenerative although indolent infection cannot be completely excluded by imaging Repeat MRI lumbar  pending  Hx of CABG, AVR Patient currently denies any chest pain or shortness of breath. Anticoagulation on hold  Atrial fibrillation, chronic (HCC) Held metoprolol 12.5 mg twice daily Eliquis on hold due to GI bleed  OSA (obstructive sleep apnea) On CPAP  Chronic back pain Pain management Outpatient appointment with neurosurgery scheduled with Dr. Saintclair Halsted 1/11, but may consult neurosurgery pending MRI findings if discitis/epidural abscess is noted  Hyperlipidemia Continue with Crestor 5 mg 3 times a week     Pressure ulcer present on admission RN Pressure Injury Documentation: Pressure Injury 08/29/22 Vertebral column Lower Stage 2 -  Partial thickness loss of dermis presenting as a shallow open injury with a red, pink wound bed without slough. two quarter sized pressure wounds over vertebra (Active)  08/29/22 1410  Location: Vertebral column  Location Orientation: Lower  Staging: Stage 2 -  Partial thickness loss of dermis presenting as a shallow open injury with a red, pink wound bed without slough.  Wound Description (Comments): two quarter sized pressure wounds over vertebra  Present on Admission: Yes  Dressing Type Foam - Lift dressing to assess site every shift 09/01/22 2105   Foam wound dressing in place  Estimated body mass index is 22.46 kg/m as calculated from the following:   Height as of this encounter: '5\' 10"'$  (1.778 m).   Weight as of this encounter: 71 kg.  Code Status: DNR DVT Prophylaxis:  SCDs Start: 08/29/22 2210   Level of Care: Level of care: Progressive Family Communication: Wife at bedside.   Disposition Plan:     Remains inpatient appropriate: Level of care  Procedures:  SBE  Consultants:   Gastroenterology.   Antimicrobials:   Anti-infectives (From admission, onward)    Start     Dose/Rate Route Frequency Ordered Stop   09/02/22 1500  cefTRIAXone (ROCEPHIN) 2 g in sodium chloride 0.9 % 100 mL IVPB        2 g 200 mL/hr over 30  Minutes Intravenous Every 24 hours 09/02/22 1344          Medications  Scheduled Meds:  amitriptyline  25 mg Oral QHS   brimonidine  1 drop Both Eyes BID   calcium-vitamin D  1 tablet Oral Daily   Chlorhexidine Gluconate Cloth  6 each Topical Daily   cholecalciferol  1,000 Units Oral Daily   cyanocobalamin  1,000 mcg Oral Daily   dorzolamide-timolol  1 drop Both Eyes BID   gabapentin  300 mg Oral QID   ipratropium  2 spray Each Nare BID   latanoprost  1 drop Both Eyes QHS   lidocaine  1 patch Transdermal Q24H   multivitamin with minerals  1 tablet Oral Daily   pantoprazole  40 mg Oral BID AC   perphenazine  2 mg Oral Once per day on Mon Wed Fri   polyethylene glycol  17 g Oral Daily   prednisoLONE acetate  1 drop Right Eye Daily   psyllium  1 packet Oral Daily   rosuvastatin  5 mg Oral Q M,W,F   senna-docusate  1 tablet Oral QHS   sucralfate  1 g Oral BID   Continuous Infusions:  sodium chloride 75 mL/hr at 09/02/22 7672   cefTRIAXone (ROCEPHIN)  IV 2 g (09/02/22 1447)   ferric gluconate (FERRLECIT) IVPB 250 mg (09/02/22 1226)    PRN Meds:.guaiFENesin, metoprolol tartrate, mouth rinse, oxyCODONE, traMADol    Subjective:   [name] continues to complain of back pain, denies any other new complaints.    Objective:   Vitals:   09/01/22 2358 09/02/22 0617 09/02/22 0634 09/02/22 1310  BP: (!) 99/59 102/62  94/60  Pulse: 92 99  91  Resp: 19   16  Temp: 98.1 F (36.7 C) 98.2 F (36.8 C)  98.4 F (36.9 C)  TempSrc: Oral Oral  Oral  SpO2: 96% 94%  94%  Weight:   71 kg   Height:        Intake/Output Summary (Last 24 hours) at 09/02/2022 1540 Last data filed at 09/02/2022 0423 Gross per 24 hour  Intake 824.68 ml  Output 300 ml  Net 524.68 ml   Filed Weights   08/29/22 1305 08/29/22 2044 09/02/22 0634  Weight: 77.1 kg 69.4 kg 71 kg     Exam General: NAD, legally blind Cardiovascular: S1, S2 present Respiratory: CTAB Abdomen: Soft, nontender,  nondistended, bowel sounds present Musculoskeletal: No bilateral pedal edema noted Skin: Normal Psychiatry: Normal mood       Data Reviewed:  I have personally reviewed following labs and imaging studies   CBC Lab Results  Component Value Date   WBC 7.2 09/02/2022   RBC 3.01 (L) 09/02/2022   HGB 7.9 (L)  09/02/2022   HCT 26.2 (L) 09/02/2022   MCV 87.0 09/02/2022   MCH 26.2 09/02/2022   PLT 224 09/02/2022   MCHC 30.2 09/02/2022   RDW 19.1 (H) 09/02/2022   LYMPHSABS 0.8 09/02/2022   MONOABS 0.7 09/02/2022   EOSABS 0.0 09/02/2022   BASOSABS 0.0 29/19/1660     Last metabolic panel Lab Results  Component Value Date   NA 132 (L) 09/01/2022   K 3.7 09/01/2022   CL 98 09/01/2022   CO2 23 09/01/2022   BUN 16 09/01/2022   CREATININE 0.90 09/01/2022   GLUCOSE 73 09/01/2022   GFRNONAA >60 09/01/2022   GFRAA >60 11/01/2016   CALCIUM 7.8 (L) 09/01/2022   PROT 7.5 04/11/2017   ALBUMIN 3.9 04/11/2017   LABGLOB 3.2 03/29/2016   BILITOT 0.91 04/11/2017   ALKPHOS 54 04/11/2017   AST 22 04/11/2017   ALT 19 04/11/2017   ANIONGAP 11 09/01/2022    CBG (last 3)  No results for input(s): "GLUCAP" in the last 72 hours.    Coagulation Profile: No results for input(s): "INR", "PROTIME" in the last 168 hours.   Radiology Studies: No results found.     Alma Friendly M.D. [facility] 09/02/2022, 3:40 PM  Available via Epic secure chat 7am-7pm After 7 pm, please refer to night coverage provider listed on amion.

## 2022-09-02 NOTE — Progress Notes (Signed)
PHARMACY - PHYSICIAN COMMUNICATION CRITICAL VALUE ALERT - BLOOD CULTURE IDENTIFICATION (BCID)  Jason [name] is an 89 y.o. male who presented to [facility] on 08/29/2022 with a chief complaint of GIB  Assessment:  strep species bacteremia in 2/2 bottles from blood culture draws  Name of physician (or Provider) Contacted: Rushie Chestnut  Current antibiotics: none  Changes to prescribed antibiotics recommended:  Rocephin 2g IV q24  Results for orders placed or performed during the hospital encounter of 08/29/22  Blood Culture ID Panel (Reflexed) (Collected: 09/01/2022  6:39 PM)  Result Value Ref Range   Enterococcus faecalis NOT DETECTED NOT DETECTED   Enterococcus Faecium NOT DETECTED NOT DETECTED   Listeria monocytogenes NOT DETECTED NOT DETECTED   Staphylococcus species NOT DETECTED NOT DETECTED   Staphylococcus aureus (BCID) NOT DETECTED NOT DETECTED   Staphylococcus epidermidis NOT DETECTED NOT DETECTED   Staphylococcus lugdunensis NOT DETECTED NOT DETECTED   Streptococcus species DETECTED (A) NOT DETECTED   Streptococcus agalactiae NOT DETECTED NOT DETECTED   Streptococcus pneumoniae NOT DETECTED NOT DETECTED   Streptococcus pyogenes NOT DETECTED NOT DETECTED   A.calcoaceticus-baumannii NOT DETECTED NOT DETECTED   Bacteroides fragilis NOT DETECTED NOT DETECTED   Enterobacterales NOT DETECTED NOT DETECTED   Enterobacter cloacae complex NOT DETECTED NOT DETECTED   Escherichia coli NOT DETECTED NOT DETECTED   Klebsiella aerogenes NOT DETECTED NOT DETECTED   Klebsiella oxytoca NOT DETECTED NOT DETECTED   Klebsiella pneumoniae NOT DETECTED NOT DETECTED   Proteus species NOT DETECTED NOT DETECTED   Salmonella species NOT DETECTED NOT DETECTED   Serratia marcescens NOT DETECTED NOT DETECTED   Haemophilus influenzae NOT DETECTED NOT DETECTED   Neisseria meningitidis NOT DETECTED NOT DETECTED   Pseudomonas aeruginosa NOT DETECTED NOT DETECTED   Stenotrophomonas maltophilia NOT  DETECTED NOT DETECTED   Candida albicans NOT DETECTED NOT DETECTED   Candida auris NOT DETECTED NOT DETECTED   Candida glabrata NOT DETECTED NOT DETECTED   Candida krusei NOT DETECTED NOT DETECTED   Candida parapsilosis NOT DETECTED NOT DETECTED   Candida tropicalis NOT DETECTED NOT DETECTED   Cryptococcus neoformans/gattii NOT DETECTED NOT DETECTED    Kara Mead 09/02/2022  1:42 PM

## 2022-09-03 ENCOUNTER — Inpatient Hospital Stay (HOSPITAL_COMMUNITY): Payer: Medicare Other

## 2022-09-03 DIAGNOSIS — K5520 Angiodysplasia of colon without hemorrhage: Secondary | ICD-10-CM | POA: Diagnosis not present

## 2022-09-03 DIAGNOSIS — Z951 Presence of aortocoronary bypass graft: Secondary | ICD-10-CM | POA: Diagnosis not present

## 2022-09-03 DIAGNOSIS — M4647 Discitis, unspecified, lumbosacral region: Secondary | ICD-10-CM

## 2022-09-03 DIAGNOSIS — Z952 Presence of prosthetic heart valve: Secondary | ICD-10-CM

## 2022-09-03 DIAGNOSIS — I38 Endocarditis, valve unspecified: Secondary | ICD-10-CM

## 2022-09-03 DIAGNOSIS — T826XXA Infection and inflammatory reaction due to cardiac valve prosthesis, initial encounter: Secondary | ICD-10-CM | POA: Diagnosis present

## 2022-09-03 DIAGNOSIS — Z8639 Personal history of other endocrine, nutritional and metabolic disease: Secondary | ICD-10-CM | POA: Diagnosis not present

## 2022-09-03 DIAGNOSIS — D62 Acute posthemorrhagic anemia: Secondary | ICD-10-CM | POA: Diagnosis not present

## 2022-09-03 DIAGNOSIS — M4620 Osteomyelitis of vertebra, site unspecified: Secondary | ICD-10-CM

## 2022-09-03 DIAGNOSIS — A491 Streptococcal infection, unspecified site: Secondary | ICD-10-CM | POA: Diagnosis not present

## 2022-09-03 DIAGNOSIS — M4640 Discitis, unspecified, site unspecified: Secondary | ICD-10-CM | POA: Diagnosis present

## 2022-09-03 DIAGNOSIS — K31811 Angiodysplasia of stomach and duodenum with bleeding: Secondary | ICD-10-CM | POA: Diagnosis not present

## 2022-09-03 LAB — BASIC METABOLIC PANEL
Anion gap: 5 (ref 5–15)
BUN: 16 mg/dL (ref 8–23)
CO2: 25 mmol/L (ref 22–32)
Calcium: 8 mg/dL — ABNORMAL LOW (ref 8.9–10.3)
Chloride: 102 mmol/L (ref 98–111)
Creatinine, Ser: 0.84 mg/dL (ref 0.61–1.24)
GFR, Estimated: >60 mL/min (ref >60)
Glucose, Bld: 121 mg/dL — ABNORMAL HIGH (ref 70–99)
Potassium: 3.9 mmol/L (ref 3.5–5.1)
Sodium: 132 mmol/L — ABNORMAL LOW (ref 135–145)

## 2022-09-03 LAB — CBC WITH DIFFERENTIAL/PLATELET
Abs Immature Granulocytes: 0.04 10*3/uL (ref 0.00–0.07)
Basophils Absolute: 0 10*3/uL (ref 0.0–0.1)
Basophils Relative: 0 %
Eosinophils Absolute: 0 10*3/uL (ref 0.0–0.5)
Eosinophils Relative: 0 %
HCT: 26.1 % — ABNORMAL LOW (ref 39.0–52.0)
Hemoglobin: 7.9 g/dL — ABNORMAL LOW (ref 13.0–17.0)
Immature Granulocytes: 0 %
Lymphocytes Relative: 9 %
Lymphs Abs: 0.9 10*3/uL (ref 0.7–4.0)
MCH: 26.5 pg (ref 26.0–34.0)
MCHC: 30.3 g/dL (ref 30.0–36.0)
MCV: 87.6 fL (ref 80.0–100.0)
Monocytes Absolute: 0.9 10*3/uL (ref 0.1–1.0)
Monocytes Relative: 10 %
Neutro Abs: 7.8 10*3/uL — ABNORMAL HIGH (ref 1.7–7.7)
Neutrophils Relative %: 81 %
Platelets: 205 10*3/uL (ref 150–400)
RBC: 2.98 MIL/uL — ABNORMAL LOW (ref 4.22–5.81)
RDW: 19.4 % — ABNORMAL HIGH (ref 11.5–15.5)
WBC: 9.6 10*3/uL (ref 4.0–10.5)
nRBC: 0.2 % (ref 0.0–0.2)

## 2022-09-03 LAB — C-REACTIVE PROTEIN: CRP: 10.3 mg/dL — ABNORMAL HIGH (ref <1.0)

## 2022-09-03 LAB — SEDIMENTATION RATE: Sed Rate: 30 mm/hr — ABNORMAL HIGH (ref 0–16)

## 2022-09-03 MED ORDER — IOHEXOL 300 MG/ML  SOLN
100.0000 mL | Freq: Once | INTRAMUSCULAR | Status: AC | PRN
  Administered 2022-09-03: 100 mL via INTRAVENOUS

## 2022-09-03 NOTE — Progress Notes (Signed)
Patient states he does not wear Cpap anymore

## 2022-09-03 NOTE — Progress Notes (Signed)
[facility]                                                                               Jason [name], is a 89 y.o. male, DOB - [birth year], UJW:[id number] Admit date - 08/29/2022    Outpatient Primary MD for the patient is [name], MD  LOS - 5  days    Brief summary    89 year old gentleman prior history of coronary artery disease s/p CABG, AVR, chronic atrial fibrillation on anticoagulation with Eliquis, obstructive sleep apnea on CPAP, bladder carcinoma s/p TURB, prostate cancer, history of GI bleed with presence of gastric and small bowel AVMs on endoscopy, history of colon cancer s/p right hemicolectomy presents with generalized weakness and fatigue and was found to have a hemoglobin of 8.6 from baseline of 13   Assessment & Plan    Assessment and Plan:  Acute blood loss anemia likely secondary to GI bleed from AVMs  Patient presented with a hemoglobin of 8 which has dropped from a baseline around 13 CT of the abdomen and pelvis with contrast does not show any metastatic disease in the abdomen GI on board: SBE done on 08/31/22 showed multiple AVMs in the stomach and 1 duodenal AVM which were ablated. Noted ulceration in the fundus of the stomach which was biopsied  Anemia panel showed iron 15, saturation 5 On IV iron supplements X 4 doses, plan to discharge on p.o. Holding Eliquis for now Continue Protonix 40 mg BID, start Carafate as per GI  Strep infantarius bacteremia (bovis family) Likely from ??discitis, vertebral osteomyelitis, reports significant back pain  Noted persistent hypotension, SBP's in the 80s Afebrile, with no leukocytosis LA WNL BC X 2, now growing strep species, repeat pending Repeat MRI lumbar concerning for L1-L2 discitis/osteomyelitis, no evidence of epidural collection, edema at the bilateral psoas muscles, without any focal collection, concerning for arachnoiditis ID consulted, appreciate recs, plan to rule out prosthetic valve  endocarditis noted poor dentition, rule out recurrence of colon cancer TTE for now Colonoscopy as an outpatient to rule out recurrence of his colon cancer CT maxillofacial to rule out sublingual/submandibular abscess Continue gentle IV fluids Continue IV ceftriaxone  Hx of CABG, AVR Patient currently denies any chest pain or shortness of breath. Anticoagulation on hold  Atrial fibrillation, chronic (HCC) Held metoprolol 12.5 mg twice daily Eliquis on hold due to GI bleed  OSA (obstructive sleep apnea) On CPAP  Chronic back pain Pain management Outpatient appointment with neurosurgery scheduled with Dr. Saintclair Halsted 1/11, but may consult neurosurgery pending MRI findings if discitis/epidural abscess is noted  Hyperlipidemia Continue with Crestor 5 mg 3 times a week     Pressure ulcer present on admission RN Pressure Injury Documentation: Pressure Injury 08/29/22 Vertebral column Lower Stage 2 -  Partial thickness loss of dermis presenting as a shallow open injury with a red, pink wound bed without slough. two quarter sized pressure wounds over vertebra (Active)  08/29/22 1410  Location: Vertebral column  Location Orientation: Lower  Staging: Stage 2 -  Partial thickness loss of dermis presenting as a shallow open injury with a red, pink wound bed without slough.  Wound Description (Comments): two  quarter sized pressure wounds over vertebra  Present on Admission: Yes  Dressing Type Foam - Lift dressing to assess site every shift 09/03/22 1124   Foam wound dressing in place    Estimated body mass index is 22.46 kg/m as calculated from the following:   Height as of this encounter: '5\' 10"'$  (1.778 m).   Weight as of this encounter: 71 kg.  Code Status: DNR DVT Prophylaxis:  SCDs Start: 08/29/22 2210   Level of Care: Level of care: Progressive Family Communication: Wife at bedside.   Disposition Plan:     Remains inpatient appropriate: Level of care  Procedures:  Small bowel  enteroscopy   Consultants:   Gastroenterology.   Antimicrobials:   Anti-infectives (From admission, onward)    Start     Dose/Rate Route Frequency Ordered Stop   09/02/22 1500  cefTRIAXone (ROCEPHIN) 2 g in sodium chloride 0.9 % 100 mL IVPB        2 g 200 mL/hr over 30 Minutes Intravenous Every 24 hours 09/02/22 1344          Medications  Scheduled Meds:  amitriptyline  25 mg Oral QHS   brimonidine  1 drop Both Eyes BID   calcium-vitamin D  1 tablet Oral Daily   Chlorhexidine Gluconate Cloth  6 each Topical Daily   cholecalciferol  1,000 Units Oral Daily   cyanocobalamin  1,000 mcg Oral Daily   dorzolamide-timolol  1 drop Both Eyes BID   gabapentin  300 mg Oral QID   ipratropium  2 spray Each Nare BID   latanoprost  1 drop Both Eyes QHS   lidocaine  1 patch Transdermal Q24H   multivitamin with minerals  1 tablet Oral Daily   pantoprazole  40 mg Oral BID AC   perphenazine  2 mg Oral Once per day on Mon Wed Fri   polyethylene glycol  17 g Oral Daily   prednisoLONE acetate  1 drop Right Eye Daily   psyllium  1 packet Oral Daily   rosuvastatin  5 mg Oral Q M,W,F   senna-docusate  1 tablet Oral QHS   sucralfate  1 g Oral BID   Continuous Infusions:  sodium chloride 75 mL/hr at 09/03/22 0117   cefTRIAXone (ROCEPHIN)  IV 2 g (09/03/22 1441)    PRN Meds:.guaiFENesin, metoprolol tartrate, mouth rinse, oxyCODONE, traMADol    Subjective:   Jason [name] appears fatigued today, poor appetite, still with back pain    Objective:   Vitals:   09/02/22 2005 09/02/22 2358 09/03/22 0522 09/03/22 1220  BP: 96/69 (!) '91/51 91/63 91/64 '$  Pulse: (!) 106 99 (!) 109 96  Resp: '17 18 17 18  '$ Temp: 99.4 F (37.4 C) (!) 97.5 F (36.4 C) 98.2 F (36.8 C) 98.6 F (37 C)  TempSrc: Oral Oral Oral Oral  SpO2: 94% 93% 91% 91%  Weight:      Height:        Intake/Output Summary (Last 24 hours) at 09/03/2022 1538 Last data filed at 09/03/2022 0900 Gross per 24 hour  Intake 2304.1  ml  Output 550 ml  Net 1754.1 ml   Filed Weights   08/29/22 1305 08/29/22 2044 09/02/22 0634  Weight: 77.1 kg 69.4 kg 71 kg     Exam General: NAD, legally blind Cardiovascular: S1, S2 present Respiratory: CTAB Abdomen: Soft, nontender, nondistended, bowel sounds present Musculoskeletal: No bilateral pedal edema noted Skin: Pressure ulcer noted Psychiatry: Normal mood       Data Reviewed:  I  have personally reviewed following labs and imaging studies   CBC Lab Results  Component Value Date   WBC 9.6 09/03/2022   RBC 2.98 (L) 09/03/2022   HGB 7.9 (L) 09/03/2022   HCT 26.1 (L) 09/03/2022   MCV 87.6 09/03/2022   MCH 26.5 09/03/2022   PLT 205 09/03/2022   MCHC 30.3 09/03/2022   RDW 19.4 (H) 09/03/2022   LYMPHSABS 0.9 09/03/2022   MONOABS 0.9 09/03/2022   EOSABS 0.0 09/03/2022   BASOSABS 0.0 50/53/9767     Last metabolic panel Lab Results  Component Value Date   NA 132 (L) 09/03/2022   K 3.9 09/03/2022   CL 102 09/03/2022   CO2 25 09/03/2022   BUN 16 09/03/2022   CREATININE 0.84 09/03/2022   GLUCOSE 121 (H) 09/03/2022   GFRNONAA >60 09/03/2022   GFRAA >60 11/01/2016   CALCIUM 8.0 (L) 09/03/2022   PROT 7.5 04/11/2017   ALBUMIN 3.9 04/11/2017   LABGLOB 3.2 03/29/2016   BILITOT 0.91 04/11/2017   ALKPHOS 54 04/11/2017   AST 22 04/11/2017   ALT 19 04/11/2017   ANIONGAP 5 09/03/2022    CBG (last 3)  No results for input(s): "GLUCAP" in the last 72 hours.    Coagulation Profile: No results for input(s): "INR", "PROTIME" in the last 168 hours.   Radiology Studies: MR LUMBAR SPINE WO CONTRAST  Result Date: 09/02/2022 CLINICAL DATA:  Low back pain, bacteremic EXAM: MRI LUMBAR SPINE WITHOUT CONTRAST TECHNIQUE: Multiplanar, multisequence MR imaging of the lumbar spine was performed. No intravenous contrast was administered. COMPARISON:  08/07/2022 MRI lumbar spine, correlation is also made with 08/30/2022 CT abdomen pelvis and 10/04/2021 CT chest abdomen  pelvis FINDINGS: Segmentation: 5 lumbar type vertebral bodies. Congenital ankylosis of T12 and L1. Alignment: Trace retrolisthesis of L1 on L2, L2 on L3, L3 on L4, and L5 on S1. Trace anterolisthesis of L4 on L5. Vertebrae: Redemonstrated decreased T1 signal and increased T2 signal in the majority of L1 and L2, similar to the prior exam. Increased T2 signal at the L1-L2 disc space. The endplate irregularity and erosion appears grossly unchanged compared to a prior MRI, and the 08/30/2022 CT appears unchanged compared to the 10/04/2021 CT. No acute fracture or suspicious osseous lesion. Increased T2 signal is also noted in the disc spaces at T11-T12, L2-L3, L3-L4 and L5-S1, without associated marrow abnormality. The Conus medullaris and cauda equina: Conus extends to the L1 level. The conus and cauda equina appear to closely approximate the dorsal aspect of the T12 and L1 vertebral body, and do not layer dependently, possibly adherent to theca (series 5, image 7. More inferiorly, there is clumping of the nerve roots (series 5, image 18). Both findings are concerning for arachnoiditis. No evidence of epidural collection. Paraspinal and other soft tissues: Edema at the medial aspect of the bilateral psoas muscles (series 5, image 17), most prominently at L2. No focal collection within the musculature. Increased T2 signal anterior to T12-L2, possibly inflammatory changes, without a focal T1 hypointense collection to suggest phlegmon. Disc levels: T12-L1: No significant disc bulge. No spinal canal stenosis or neural foraminal narrowing. L1-L2: Trace retrolisthesis and minimal disc bulge. No spinal canal stenosis or neural foraminal narrowing. L2-L3: Trace retrolisthesis with disc osteophyte complex. Mild facet arthropathy. Narrowing of the lateral recesses. No spinal canal stenosis or neural foraminal narrowing. L3-L4: Trace retrolisthesis. Mild facet arthropathy. No spinal canal stenosis or neural foraminal narrowing.  L4-L5: Trace anterolisthesis. Moderate facet arthropathy. Ligamentum flavum hypertrophy. Narrowing of the  lateral recesses. Mild spinal canal stenosis. Mild right neural foraminal narrowing. L5-S1: Mild disc bulge. Moderate right and mild left facet arthropathy. Prior laminectomy. No spinal canal stenosis. Severe right and moderate left neural foraminal narrowing. IMPRESSION: 1. Fluid signal throughout the L1-L2 disc space, which appears increased compared to 08/07/2022, with similar T2 hyperintense signal and erosive changes in the adjacent vertebral bodies, concerning for L1-L2 discitis/osteomyelitis. No evidence of epidural collection. 2. Increased T2 signal in the disc spaces at T11-T12, L2-L3, L3-L4, and L5-S1, without associated marrow abnormality, which is favored to be degenerative in nature. Attention on follow-up 3. Edema at the medial aspect of the bilateral psoas muscles, most prominently at L2, without focal collection, likely reactive. 4. The conus and cauda equina are adherent to the thecal sac in the upper lumbar spine and appear clumped in the lower lumbar spine, concerning for arachnoiditis. These results will be called to the ordering clinician or representative by the Radiologist Assistant, and communication documented in the PACS or [employer]. Electronically Signed   By: Merilyn Baba M.D.   On: 09/02/2022 23:13       Alma Friendly M.D. [facility] 09/03/2022, 3:38 PM  Available via Epic secure chat 7am-7pm After 7 pm, please refer to night coverage provider listed on amion.

## 2022-09-03 NOTE — Progress Notes (Signed)
Patient had 10 beats runs of V-tach, vitals WNL, patient denies any chest pain. Daniels-NP notified and also informed provider of patient's dark bloody stool. Reported off to night shift nurse to F/U with plan of care.

## 2022-09-03 NOTE — Consult Note (Signed)
Ali Molina Nurse Consult Note: Reason for Consult:Stage 2 pressure injuries to vertebral column noted on admission Wound type:pressure Pressure Injury POA: Yes Measurement:Per nursing flow sheet Wound bed:25% red with some yellow fibrinous slough (75%) present Drainage (amount, consistency, odor) none Periwound:mild erythema Dressing procedure/placement/frequency:I have provided Nursing with guidance for the topical care of these lesions using a daily NS cleanse followed by placement of xeroform gauze/dry gauze and securement with silicone foam dressing. Turning and repositioning from side to side and minimizing time in the supine position is recommended as is the floatation of heels.  [facility] nursing team will not follow, but will remain available to this patient, the nursing and medical teams.  Please re-consult if needed.  Thank you for inviting [place] to participate in this patient's Plan of Care.  Maudie Flakes, MSN, RN, CNS, [place], Serita Grammes, [employer], [employer] phone:  [phone]

## 2022-09-03 NOTE — Consult Note (Signed)
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 with  L1-2 diskitis, vertebral osteomyelitis, possible T11-T12, L2-L3, L3-L4, and L5-S1 diskitis, psoas muscle edema, and arachnoiditis  Rule out prosthetic valve endocarditis Rule out recurrence of colon cancer GIB due to AVM Poor dentition CABG OSA Bladder cancer sp TURBT Atrial fibrillation  Plan:  Continue ceftriaxone for now Follow-up sensitivities  repeat blood cultures drawn today 2D echocardiogram and unless there is concern for complication would ultimately pursue TEE Close followup on Neuro exam He will need colonoscopy as outpatient to ensure that this Strep bovis bacteremia is not due to recurrence of his colon cancer.  Dr. West Bali is back tomorrow.  Principal Problem:   Streptococcus bovis infection Active Problems:   OSA (obstructive sleep apnea)   Atrial fibrillation, chronic (HCC)   Gastric AVM   Anemia   Anemia associated with acute blood loss   GI bleed   Hx of CABG   Hypotension   Heme positive stool   AVM (arteriovenous malformation) of small bowel, acquired   History of iron deficiency   S/P AVR   Prosthetic valve endocarditis (HCC)   Diskitis   Vertebral osteomyelitis (HCC)   Scheduled Meds:  amitriptyline  25 mg Oral QHS   brimonidine  1 drop Both Eyes BID   calcium-vitamin D  1 tablet Oral Daily   Chlorhexidine Gluconate Cloth  6 each Topical Daily   cholecalciferol  1,000 Units Oral Daily   cyanocobalamin  1,000 mcg Oral Daily   dorzolamide-timolol  1 drop Both Eyes BID   gabapentin  300 mg Oral QID   ipratropium  2 spray Each Nare BID   latanoprost  1 drop Both Eyes QHS   lidocaine  1 patch Transdermal Q24H    multivitamin with minerals  1 tablet Oral Daily   pantoprazole  40 mg Oral BID AC   perphenazine  2 mg Oral Once per day on Mon Wed Fri   polyethylene glycol  17 g Oral Daily   prednisoLONE acetate  1 drop Right Eye Daily   psyllium  1 packet Oral Daily   rosuvastatin  5 mg Oral Q M,W,F   senna-docusate  1 tablet Oral QHS   sucralfate  1 g Oral BID   Continuous Infusions:  sodium chloride 75 mL/hr at 09/03/22 0117   cefTRIAXone (ROCEPHIN)  IV 2 g (09/02/22 1447)   ferric gluconate (FERRLECIT) IVPB 250 mg (09/03/22 1207)   PRN Meds:.guaiFENesin, metoprolol tartrate, mouth rinse, oxyCODONE, traMADol  HPI: Jason [name] is a 89 y.o. male with history of colon cancer status post hemicolectomy, coronary artery disease status post coronary artery bypass graft and aortic valve replacement, chronic atrial fibrillation on anticoagulation with Eliquis obstructive sleep apnea prostate carcinoma status TURB who has had well over a month of severe back pain.  Did have an MRI of the lumbar spine without contrast on August 07, 2022 which showed severe endplate degeneration Z3-G9 with hyperintensity marrow edema which are progressive compared to prior MRI, with multilevel spondylosis and mild to moderate spinal spinal stenosis L4-L5 and chronic moderate foraminal narrowing at L5-S1 with remote postsurgical changes L5-S1,  Was going to see neurosurgery but appointment was this week actually.  In  the interim he continues to suffer from back pain and was having poor oral intake and fatigue that progressed with weight loss.  He was profoundly anemic on labs done with primary care and ultimately came to the ER at med center [place].  In the ER he was hypotensive with a systolic blood pressure in the 70s.  They responded to fluids hemoglobin was 8.6 and his fecal occult blood test was positive.  In the interim he underwent endoscopy with EGD which showed:  Shelf-like deformity (query osteophyte  disease) found in the proximal esophagus with a pill in the region (pushed gently into stomach).  - Non-bleeding gastric ulcers with a clean ulcer base  found in the fundus. Biopsied. - A few fundic gland polyps. - Three non-bleeding angiodysplastic lesions in the stomach. Treated with argon plasma coagulation (Clip (MR conditional) was placed.  - Patchy moderate erythematous mucosa in the rest of the stomach. Biopsied. - A single non-bleeding angiodysplastic lesion in D3. Treated with argon plasma coagulation (APC).  He continued have severe back pain and blood pressures continue to be low.  Blood cultures were taken > 3 days into his hospitalization and are now positive for Streptococcus infantarius (is in the Streptococcus Bovis). This is clearly NOT a hospital acquired infection but undiagnosed bacteremia  MRI of the L spine showed:  IMPRESSION: 1. Fluid signal throughout the L1-L2 disc space, which appears increased compared to 08/07/2022, with similar T2 hyperintense signal and erosive changes in the adjacent vertebral bodies, concerning for L1-L2 discitis/osteomyelitis. No evidence of epidural collection. 2. Increased T2 signal in the disc spaces at T11-T12, L2-L3, L3-L4, and L5-S1, without associated marrow abnormality, which is favored to be degenerative in nature. Attention on follow-up 3. Edema at the medial aspect of the bilateral psoas muscles, most prominently at L2, without focal collection, likely reactive. 4. The conus and cauda equina are adherent to the thecal sac in the upper lumbar spine and appear clumped in the l lumbar spine, concerning for arachnoiditis.  He has breakdown of the skin in the lumbar area with fibrinous exudate that wound care have identified.    Streptococcus Infantarius (bovis) + blood culture = 1 MAJOR Duke criteria for Infectious endocarditis and with a a lytic aortic valve he has a minor criteria for endocarditis already as  well.  Concerned that he does have infectious endocarditis involving his prosthetic valve and/or native valves  He clearly has discitis vertebral osteomyelitis undoubtably due to the same Streptococcus infantarius species.   This is and is also in the Streptococcus bovis family and its presence in the blood is associated with colonic malignancy but this patient has previously had.  Despite his normal CT of the abdomen pelvis he clearly needs a colonoscopy when he is stable to exclude recurrence of his colon cancer.  Prior to knowing the organism was Streptococcus infantarius I was concerned for potential dental source and ordered CT MF.  His oropharynx is assuredly not the source though it would be important to look for disease there as he has abnormal dentition.   I spent 86 minutes with the patient including than 50% of the time in face to face counseling of the patient and his wife regarding the nature of streptococcal bacteremia prosthetic valve endocarditis discitis osteomyelitis, personally reviewing prior lumbar spine CT abdomen pelvis along with review of medical records in preparation for the visit and during the visit and in coordination of his care.       Review of Systems:  Review of Systems  Constitutional:  Negative for chills, fever, malaise/fatigue and weight loss.  HENT:  Negative for congestion and sore throat.   Eyes:  Negative for blurred vision and photophobia.  Respiratory:  Negative for cough, shortness of breath and wheezing.   Cardiovascular:  Negative for chest pain, palpitations and leg swelling.  Gastrointestinal:  Negative for abdominal pain, blood in stool, constipation, diarrhea, heartburn, melena, nausea and vomiting.  Genitourinary:  Negative for dysuria, flank pain and hematuria.  Musculoskeletal:  Positive for back pain. Negative for falls, joint pain and myalgias.  Skin:  Negative for itching and rash.  Neurological:  Positive for focal weakness and  weakness. Negative for dizziness, loss of consciousness and headaches.  Endo/Heme/Allergies:  Does not bruise/bleed easily.  Psychiatric/Behavioral:  Negative for depression and suicidal ideas. The patient does not have insomnia.     Past Medical History:  Diagnosis Date   Abnormal PSA    Adenomatous polyp of colon 2010 and before 2006   Anemia    Anxiety    Aortic stenosis    Aortic valve prosthesis present    Atrial fibrillation (HCC)    CAD (coronary artery disease)    DDD (degenerative disc disease), lumbar    Depression    Diverticulosis    Dyslipidemia    GERD (gastroesophageal reflux disease)    Glaucoma    History of blood transfusion 09/29/2016   History of kidney stones    Hx of CABG    Hypogonadism, male    Macular degeneration    OSA (obstructive sleep apnea)    Osteoarthritis    Osteopenia    Prostate CA ([place]) prostate 2007   colon dx 2018, watching psa levels, no tx yet for prostate    Social History   Tobacco Use   Smoking status: Former    Packs/day: 1.00    Years: 40.00    Total pack years: 40.00    Types: Cigarettes    Quit date: 11/16/1990    Years since quitting: 31.8   Smokeless tobacco: Never  Vaping Use   Vaping Use: Never used  Substance Use Topics   Alcohol use: Yes    Alcohol/week: 2.0 standard drinks of alcohol    Types: 2 Glasses of wine per week    Comment: wine few x per week   Drug use: No    Family History  Problem Relation Age of Onset   Heart attack Father    Colon cancer Neg Hx    Esophageal cancer Neg Hx    Stomach cancer Neg Hx    Rectal cancer Neg Hx    Allergies  Allergen Reactions   Xarelto [Rivaroxaban]     GI BLEED    OBJECTIVE: Blood pressure 91/64, pulse 96, temperature 98.6 F (37 C), temperature source Oral, resp. rate 18, height '5\' 10"'$  (1.778 m), weight 71 kg, SpO2 91 %.  Physical Exam Constitutional:      Appearance: He is well-developed. He is obese.  HENT:     Head: Normocephalic and  atraumatic.     Nose: Nose normal. No congestion or rhinorrhea.     Mouth/Throat:     Dentition: Dental caries present.     Tongue: No lesions. Tongue does not deviate from midline.     Palate: No mass and lesions.     Pharynx: No pharyngeal swelling, oropharyngeal exudate or posterior oropharyngeal erythema.  Eyes:     Conjunctiva/sclera: Conjunctivae normal.  Cardiovascular:     Rate and  Rhythm: Normal rate and regular rhythm.     Heart sounds: No murmur heard.    No friction rub. No gallop.  Pulmonary:     Effort: Pulmonary effort is normal. No respiratory distress.     Breath sounds: No stridor. No wheezing or rhonchi.  Abdominal:     General: There is no distension.     Palpations: Abdomen is soft.  Musculoskeletal:        General: No tenderness. Normal range of motion.     Cervical back: Normal range of motion and neck supple.  Skin:    General: Skin is warm and dry.     Coloration: Skin is pale.  Neurological:     General: No focal deficit present.     Mental Status: He is oriented to person, place, and time.  Psychiatric:        Attention and Perception: He is inattentive.        Mood and Affect: Mood normal.        Speech: Speech is delayed.        Behavior: Behavior is slowed.        Cognition and Memory: Cognition is impaired.     Comments: Very sleepy when I was in the room with him     Lab Results Lab Results  Component Value Date   WBC 9.6 09/03/2022   HGB 7.9 (L) 09/03/2022   HCT 26.1 (L) 09/03/2022   MCV 87.6 09/03/2022   PLT 205 09/03/2022    Lab Results  Component Value Date   CREATININE 0.84 09/03/2022   BUN 16 09/03/2022   NA 132 (L) 09/03/2022   K 3.9 09/03/2022   CL 102 09/03/2022   CO2 25 09/03/2022    Lab Results  Component Value Date   ALT 19 04/11/2017   AST 22 04/11/2017   ALKPHOS 54 04/11/2017   BILITOT 0.91 04/11/2017     Microbiology: Recent Results (from the past 240 hour(s))  MRSA Next Gen by PCR, Nasal     Status: None    Collection Time: 08/29/22  8:52 PM   Specimen: Nasal Mucosa; Nasal Swab  Result Value Ref Range Status   MRSA by PCR Next Gen NOT DETECTED NOT DETECTED Final    Comment: (NOTE) The GeneXpert MRSA Assay (FDA approved for NASAL specimens only), is one component of a comprehensive MRSA colonization surveillance program. It is not intended to diagnose MRSA infection nor to guide or monitor treatment for MRSA infections. Test performance is not FDA approved in patients less than 84 years old. Performed at [facility], [place] [address], [place], [place] [address]   Culture, blood (Routine X 2) w Reflex to ID Panel     Status: Abnormal (Preliminary result)   Collection Time: 09/01/22  6:39 PM   Specimen: BLOOD RIGHT ARM  Result Value Ref Range Status   Specimen Description   Final    BLOOD RIGHT ARM Performed at [facility], [place] [address], [place], [place] [address]    Special Requests   Final    BOTTLES DRAWN AEROBIC ONLY Blood Culture adequate volume Performed at [facility] [address], [place],  [address]    Culture  Setup Time   Final    AEROBIC BOTTLE ONLY GRAM POSITIVE COCCI IN CHAINS Organism ID to follow CRITICAL RESULT CALLED TO, READ BACK BY AND VERIFIED WITH:  C/ PHARMD J. LEGGE 09/02/22 1337 A. LAFRANCE    Culture (A)  Final    STREPTOCOCCUS INFANTARIUS SUSCEPTIBILITIES TO FOLLOW Performed at [facility], [place] [address] [place], [place] [address]    Report Status PENDING  Incomplete  Culture, blood (Routine X 2) w Reflex to ID Panel     Status: Abnormal (Preliminary result)   Collection Time: 09/01/22  6:39 PM   Specimen: BLOOD LEFT HAND  Result Value Ref Range Status   Specimen Description   Final    BLOOD LEFT HAND Performed at [facility], [place] [address], [place], [place] [address]    Special Requests   Final    BOTTLES DRAWN AEROBIC ONLY Blood Culture adequate volume Performed at  [facility] [address], [place], [place] [address]    Culture  Setup Time   Final    AEROBIC BOTTLE ONLY GRAM POSITIVE COCCI IN CHAINS CRITICAL VALUE NOTED.  VALUE IS CONSISTENT WITH PREVIOUSLY REPORTED AND CALLED VALUE. Performed at [facility], [place] [address] [place], [place] [address]    Culture STREPTOCOCCUS INFANTARIUS (A)  Final   Report Status PENDING  Incomplete  Blood Culture ID Panel (Reflexed)     Status: Abnormal   Collection Time: 09/01/22  6:39 PM  Result Value Ref Range Status   Enterococcus faecalis NOT DETECTED NOT DETECTED Final   Enterococcus Faecium NOT DETECTED NOT DETECTED Final   Listeria monocytogenes NOT DETECTED NOT DETECTED Final   Staphylococcus species NOT DETECTED NOT DETECTED Final   Staphylococcus aureus (BCID) NOT DETECTED NOT DETECTED Final   Staphylococcus epidermidis NOT DETECTED NOT DETECTED Final   Staphylococcus lugdunensis NOT DETECTED NOT DETECTED Final   Streptococcus species DETECTED (A) NOT DETECTED Final    Comment: Not Enterococcus species, Streptococcus agalactiae, Streptococcus pyogenes, or Streptococcus pneumoniae. CRITICAL RESULT CALLED TO, READ BACK BY AND VERIFIED WITH:  C/ PHARMD J. LEGGE 09/02/22 1337 A. LAFRANCE    Streptococcus agalactiae NOT DETECTED NOT DETECTED Final   Streptococcus pneumoniae NOT DETECTED NOT DETECTED Final   Streptococcus pyogenes NOT DETECTED NOT DETECTED Final   A.calcoaceticus-baumannii NOT DETECTED NOT DETECTED Final   Bacteroides fragilis NOT DETECTED NOT DETECTED Final   Enterobacterales NOT DETECTED NOT DETECTED Final   Enterobacter cloacae complex NOT DETECTED NOT DETECTED Final   Escherichia coli NOT DETECTED NOT DETECTED Final   Klebsiella aerogenes NOT DETECTED NOT DETECTED Final   Klebsiella oxytoca NOT DETECTED NOT DETECTED Final   Klebsiella pneumoniae NOT DETECTED NOT DETECTED Final   Proteus species NOT DETECTED NOT DETECTED Final   Salmonella species NOT  DETECTED NOT DETECTED Final   Serratia marcescens NOT DETECTED NOT DETECTED Final   Haemophilus influenzae NOT DETECTED NOT DETECTED Final   Neisseria meningitidis NOT DETECTED NOT DETECTED Final   Pseudomonas aeruginosa NOT DETECTED NOT DETECTED Final   Stenotrophomonas maltophilia NOT DETECTED NOT DETECTED Final   Candida albicans NOT DETECTED NOT DETECTED Final   Candida auris NOT DETECTED NOT DETECTED Final   Candida glabrata NOT DETECTED NOT DETECTED Final   Candida krusei NOT DETECTED NOT DETECTED Final   Candida parapsilosis NOT DETECTED NOT DETECTED Final   Candida tropicalis NOT DETECTED NOT DETECTED Final   Cryptococcus neoformans/gattii NOT DETECTED NOT DETECTED Final    Comment: Performed at [facility], [address], [place],  [address]    Alcide Evener, [facility] [phone] pager  09/03/2022, 1:34 PM

## 2022-09-04 ENCOUNTER — Encounter (HOSPITAL_COMMUNITY): Payer: Self-pay | Admitting: Gastroenterology

## 2022-09-04 ENCOUNTER — Inpatient Hospital Stay (HOSPITAL_COMMUNITY): Payer: Medicare Other

## 2022-09-04 DIAGNOSIS — R7881 Bacteremia: Secondary | ICD-10-CM

## 2022-09-04 DIAGNOSIS — D62 Acute posthemorrhagic anemia: Secondary | ICD-10-CM | POA: Diagnosis not present

## 2022-09-04 DIAGNOSIS — Z8639 Personal history of other endocrine, nutritional and metabolic disease: Secondary | ICD-10-CM | POA: Diagnosis not present

## 2022-09-04 DIAGNOSIS — K31811 Angiodysplasia of stomach and duodenum with bleeding: Secondary | ICD-10-CM | POA: Diagnosis not present

## 2022-09-04 DIAGNOSIS — K5520 Angiodysplasia of colon without hemorrhage: Secondary | ICD-10-CM | POA: Diagnosis not present

## 2022-09-04 LAB — CULTURE, BLOOD (ROUTINE X 2)
Special Requests: ADEQUATE
Special Requests: ADEQUATE

## 2022-09-04 LAB — CBC WITH DIFFERENTIAL/PLATELET
Abs Immature Granulocytes: 0.05 K/uL (ref 0.00–0.07)
Basophils Absolute: 0 K/uL (ref 0.0–0.1)
Basophils Relative: 1 %
Eosinophils Absolute: 0.1 K/uL (ref 0.0–0.5)
Eosinophils Relative: 1 %
HCT: 25.5 % — ABNORMAL LOW (ref 39.0–52.0)
Hemoglobin: 7.7 g/dL — ABNORMAL LOW (ref 13.0–17.0)
Immature Granulocytes: 1 %
Lymphocytes Relative: 14 %
Lymphs Abs: 1.2 K/uL (ref 0.7–4.0)
MCH: 27.2 pg (ref 26.0–34.0)
MCHC: 30.2 g/dL (ref 30.0–36.0)
MCV: 90.1 fL (ref 80.0–100.0)
Monocytes Absolute: 0.8 K/uL (ref 0.1–1.0)
Monocytes Relative: 10 %
Neutro Abs: 6.3 K/uL (ref 1.7–7.7)
Neutrophils Relative %: 73 %
Platelets: 186 K/uL (ref 150–400)
RBC: 2.83 MIL/uL — ABNORMAL LOW (ref 4.22–5.81)
RDW: 20.4 % — ABNORMAL HIGH (ref 11.5–15.5)
WBC: 8.4 K/uL (ref 4.0–10.5)
nRBC: 0.2 % (ref 0.0–0.2)

## 2022-09-04 LAB — ECHOCARDIOGRAM COMPLETE
AR max vel: 1.33 cm2
AV Area VTI: 1.25 cm2
AV Area mean vel: 1.2 cm2
AV Mean grad: 16 mmHg
AV Peak grad: 25.2 mmHg
Ao pk vel: 2.51 m/s
Area-P 1/2: 4.06 cm2
Height: 70 in
MV VTI: 2.43 cm2
S' Lateral: 2.9 cm
Weight: 2504.43 [oz_av]

## 2022-09-04 LAB — BASIC METABOLIC PANEL WITH GFR
Anion gap: 7 (ref 5–15)
BUN: 14 mg/dL (ref 8–23)
CO2: 26 mmol/L (ref 22–32)
Calcium: 7.9 mg/dL — ABNORMAL LOW (ref 8.9–10.3)
Chloride: 99 mmol/L (ref 98–111)
Creatinine, Ser: 0.88 mg/dL (ref 0.61–1.24)
GFR, Estimated: >60 mL/min
Glucose, Bld: 106 mg/dL — ABNORMAL HIGH (ref 70–99)
Potassium: 4 mmol/L (ref 3.5–5.1)
Sodium: 132 mmol/L — ABNORMAL LOW (ref 135–145)

## 2022-09-04 NOTE — Progress Notes (Signed)
Physical Therapy Treatment Patient Details Name: Jason [name] MRN: [id number] DOB: 11-06-1925 Today's Date: 09/04/2022   History of Present Illness 89 year old male admitted 08/29/22 for Acute blood loss anemia likely secondary to GI bleed from AVMs. PMHx: coronary artery disease s/p CABG, AVR, chronic atrial fibrillation on anticoagulation with Eliquis, obstructive sleep apnea on CPAP, bladder carcinoma s/p TURB, prostate cancer, GI bleed with presence of gastric and small bowel AVMs on endoscopy, colon cancer s/p right hemicolectomy    PT Comments    Pt is AxO x 2 pleasant and following all directions.  Spouse present and assisting with feeding pt.  Assisted OOB to recliner was difficult.  General bed mobility comments: MazAssist with increased time as well as using bed pad to complete scooting to EOB. Pt incont BM upond standing.  Assisted to [facility].  General transfer comment: required increased assist + 2 with Mod posterior lean and 75% VC's turn competion and back steps as pt was attempting to sit prior to transfer completion.  First asissted to [facility] then to recliner. Very unsteady transfer requiring + 2 asisst.  HIGH FALL RISK. Pt will need ST Rehab at SNF to address mobility and functional decline prior to safely returning home.   Recommendations for follow up therapy are one component of a multi-disciplinary discharge planning process, led by the attending physician.  Recommendations may be updated based on patient status, additional functional criteria and insurance authorization.  Follow Up Recommendations  Skilled nursing-short term rehab (<3 hours/day) Can patient physically be transported by private vehicle: No   Assistance Recommended at Discharge Intermittent Supervision/Assistance  Patient can return home with the following A lot of help with walking and/or transfers;A lot of help with bathing/dressing/bathroom;Assistance with cooking/housework   Equipment Recommendations  None  recommended by PT    Recommendations for Other Services       Precautions / Restrictions Precautions Precautions: Back;Fall Precaution Comments: back for comfort Restrictions Weight Bearing Restrictions: No     Mobility  Bed Mobility Overal bed mobility: Needs Assistance Bed Mobility: Supine to Sit     Supine to sit: Max assist     General bed mobility comments: MazAssist with increased time as well as using bed pad to complete scooting to EOB.    Transfers Overall transfer level: Needs assistance Equipment used: Rolling walker (2 wheels) Transfers: Sit to/from Stand Sit to Stand: Mod assist, +2 safety/equipment, From elevated surface           General transfer comment: required increased assist + 2 with Mod posterior lean and 75% VC's turn competion and back steps as pt was attempting to sit prior to transfer completion.  First asissted to [facility] then to recliner.    Ambulation/Gait               General Gait Details: transfers only this session due to incont BM on standing   Stairs             Wheelchair Mobility    Modified Rankin (Stroke Patients Only)       Balance                                            Cognition Arousal/Alertness: Awake/alert Behavior During Therapy: WFL for tasks assessed/performed Overall Cognitive Status: Within Functional Limits for tasks assessed  General Comments: AxO x 2 pleasant and following commands.  Incont BB.        Exercises      General Comments        Pertinent Vitals/Pain Pain Assessment Pain Assessment: Faces Faces Pain Scale: Hurts a little bit Pain Location: low back Pain Descriptors / Indicators: Grimacing, Aching, Sore Pain Intervention(s): Monitored during session, Repositioned    Home Living                          Prior Function            PT Goals (current goals can now be found in the care plan  section) Progress towards PT goals: Progressing toward goals    Frequency    Min 2X/week      PT Plan Current plan remains appropriate    Co-evaluation              AM-PAC PT "6 Clicks" Mobility   Outcome Measure  Help needed turning from your back to your side while in a flat bed without using bedrails?: A Lot Help needed moving from lying on your back to sitting on the side of a flat bed without using bedrails?: A Lot Help needed moving to and from a bed to a chair (including a wheelchair)?: A Lot Help needed standing up from a chair using your arms (e.g., wheelchair or bedside chair)?: A Lot Help needed to walk in hospital room?: Total Help needed climbing 3-5 steps with a railing? : Total 6 Click Score: 10    End of Session Equipment Utilized During Treatment: Gait belt Activity Tolerance: Patient limited by fatigue Patient left: in chair;with call bell/phone within reach;with chair alarm set;with family/visitor present Nurse Communication: Mobility status PT Visit Diagnosis: Difficulty in walking, not elsewhere classified (R26.2);Muscle weakness (generalized) (M62.81)     Time: 2725-3664 PT Time Calculation (min) (ACUTE ONLY): 28 min  Charges:  $Therapeutic Activity: 23-37 mins                     Rica Koyanagi  PTA Acute  Rehabilitation Services Office M-F          [phone] Weekend pager [phone]

## 2022-09-04 NOTE — Progress Notes (Signed)
[facility]                                                                               Jason [name], is a 89 y.o. male, DOB - Mar 16, 1926, QIW:[id number] Admit date - 08/29/2022    Outpatient Primary MD for the patient is [name], MD  LOS - 6  days    Brief summary    89 year old gentleman prior history of coronary artery disease s/p CABG, AVR, chronic atrial fibrillation on anticoagulation with Eliquis, obstructive sleep apnea on CPAP, bladder carcinoma s/p TURB, prostate cancer, history of GI bleed with presence of gastric and small bowel AVMs on endoscopy, history of colon cancer s/p right hemicolectomy presents with generalized weakness and fatigue and was found to have a hemoglobin of 8.6 from baseline of 13   Assessment & Plan    Assessment and Plan:  Acute blood loss anemia likely secondary to GI bleed from AVMs  Patient presented with a hemoglobin of 8 which has dropped from a baseline around 13 CT of the abdomen and pelvis with contrast does not show any metastatic disease in the abdomen GI on board: SBE done on 08/31/22 showed multiple AVMs in the stomach and 1 duodenal AVM which were ablated. Noted ulceration in the fundus of the stomach which was biopsied  Anemia panel showed iron 15, saturation 5 On IV iron supplements X 4 doses, plan to discharge on p.o. Holding Eliquis for now Continue Protonix 40 mg BID, start Carafate as per GI  Strep infantarius bacteremia (bovis family) Likely from ??discitis, vertebral osteomyelitis, reports significant back pain  Noted persistent hypotension, SBP's in the 80s Afebrile, with no leukocytosis LA WNL BC X 2, now growing strep species, repeat pending Repeat MRI lumbar concerning for L1-L2 discitis/osteomyelitis, no evidence of epidural collection, edema at the bilateral psoas muscles, without any focal collection, concerning for arachnoiditis ID consulted, appreciate recs, plan to rule out prosthetic valve  endocarditis noted poor dentition, rule out recurrence of colon cancer TTE no evidence of vegetation, valve in place, EF 60 to 65%, no regional wall motion abnormality Colonoscopy as an outpatient to rule out recurrence of his colon cancer CT maxillofacial to ruled out sublingual/submandibular abscess Stop gentle IV fluids Continue IV ceftriaxone  Hx of CABG, AVR Patient currently denies any chest pain or shortness of breath. Anticoagulation on hold  Atrial fibrillation, chronic (HCC) Held metoprolol 12.5 mg twice daily Eliquis on hold due to GI bleed  OSA (obstructive sleep apnea) On CPAP  Chronic back pain Pain management Outpatient appointment with neurosurgery was rescheduled with Dr. Saintclair Halsted by wife   Hyperlipidemia Continue with Crestor 5 mg 3 times a week     Pressure ulcer present on admission RN Pressure Injury Documentation: Pressure Injury 08/29/22 Vertebral column Lower Stage 2 -  Partial thickness loss of dermis presenting as a shallow open injury with a red, pink wound bed without slough. two quarter sized pressure wounds over vertebra (Active)  08/29/22 1410  Location: Vertebral column  Location Orientation: Lower  Staging: Stage 2 -  Partial thickness loss of dermis presenting as a shallow open injury with a red, pink wound bed without slough.  Wound Description (Comments): two quarter sized pressure wounds over vertebra  Present on Admission: Yes  Dressing Type Foam - Lift dressing to assess site every shift 09/04/22 1700   Foam wound dressing in place    Estimated body mass index is 22.46 kg/m as calculated from the following:   Height as of this encounter: '5\' 10"'$  (1.778 m).   Weight as of this encounter: 71 kg.  Code Status: DNR DVT Prophylaxis:  SCDs Start: 08/29/22 2210   Level of Care: Level of care: Progressive Family Communication: Wife at bedside.   Disposition Plan:     Remains inpatient appropriate: Level of care  Procedures:  Small  bowel enteroscopy   Consultants:   Gastroenterology.   Antimicrobials:   Anti-infectives (From admission, onward)    Start     Dose/Rate Route Frequency Ordered Stop   09/02/22 1500  cefTRIAXone (ROCEPHIN) 2 g in sodium chloride 0.9 % 100 mL IVPB        2 g 200 mL/hr over 30 Minutes Intravenous Every 24 hours 09/02/22 1344          Medications  Scheduled Meds:  amitriptyline  25 mg Oral QHS   brimonidine  1 drop Both Eyes BID   calcium-vitamin D  1 tablet Oral Daily   cholecalciferol  1,000 Units Oral Daily   cyanocobalamin  1,000 mcg Oral Daily   dorzolamide-timolol  1 drop Both Eyes BID   gabapentin  300 mg Oral QID   ipratropium  2 spray Each Nare BID   latanoprost  1 drop Both Eyes QHS   lidocaine  1 patch Transdermal Q24H   multivitamin with minerals  1 tablet Oral Daily   pantoprazole  40 mg Oral BID AC   perphenazine  2 mg Oral Once per day on Mon Wed Fri   polyethylene glycol  17 g Oral Daily   prednisoLONE acetate  1 drop Right Eye Daily   psyllium  1 packet Oral Daily   rosuvastatin  5 mg Oral Q M,W,F   senna-docusate  1 tablet Oral QHS   sucralfate  1 g Oral BID   Continuous Infusions:  sodium chloride 75 mL/hr at 09/04/22 0647   cefTRIAXone (ROCEPHIN)  IV 2 g (09/04/22 1450)    PRN Meds:.guaiFENesin, metoprolol tartrate, mouth rinse, oxyCODONE, traMADol    Subjective:   Jason [name] appears fatigued. Noted to have intermittent bloody stool. Continue to monitor closely. Discussed extensively with wife at bedside    Objective:   Vitals:   09/03/22 2015 09/04/22 0000 09/04/22 0416 09/04/22 1310  BP: '96/79 98/63 97/62 '$ (!) 92/54  Pulse: (!) 105 79 81 92  Resp: '18 16 16 16  '$ Temp: 98 F (36.7 C) 98.2 F (36.8 C) 97.8 F (36.6 C) 97.9 F (36.6 C)  TempSrc: Oral Axillary Axillary Oral  SpO2: 94% 93% 94% 94%  Weight:      Height:        Intake/Output Summary (Last 24 hours) at 09/04/2022 1758 Last data filed at 09/04/2022 1700 Gross per 24  hour  Intake 3030.42 ml  Output 676 ml  Net 2354.42 ml   Filed Weights   08/29/22 1305 08/29/22 2044 09/02/22 0634  Weight: 77.1 kg 69.4 kg 71 kg     Exam General: NAD, legally blind, very deconditioned  Cardiovascular: S1, S2 present Respiratory: CTAB Abdomen: Soft, nontender, nondistended, bowel sounds present Musculoskeletal: No bilateral pedal edema noted Skin: Pressure ulcer noted Psychiatry: Fair mood  Data Reviewed:  I have personally reviewed following labs and imaging studies   CBC Lab Results  Component Value Date   WBC 8.4 09/04/2022   RBC 2.83 (L) 09/04/2022   HGB 7.7 (L) 09/04/2022   HCT 25.5 (L) 09/04/2022   MCV 90.1 09/04/2022   MCH 27.2 09/04/2022   PLT 186 09/04/2022   MCHC 30.2 09/04/2022   RDW 20.4 (H) 09/04/2022   LYMPHSABS 1.2 09/04/2022   MONOABS 0.8 09/04/2022   EOSABS 0.1 09/04/2022   BASOSABS 0.0 94/85/4627     Last metabolic panel Lab Results  Component Value Date   NA 132 (L) 09/04/2022   K 4.0 09/04/2022   CL 99 09/04/2022   CO2 26 09/04/2022   BUN 14 09/04/2022   CREATININE 0.88 09/04/2022   GLUCOSE 106 (H) 09/04/2022   GFRNONAA >60 09/04/2022   GFRAA >60 11/01/2016   CALCIUM 7.9 (L) 09/04/2022   PROT 7.5 04/11/2017   ALBUMIN 3.9 04/11/2017   LABGLOB 3.2 03/29/2016   BILITOT 0.91 04/11/2017   ALKPHOS 54 04/11/2017   AST 22 04/11/2017   ALT 19 04/11/2017   ANIONGAP 7 09/04/2022    CBG (last 3)  No results for input(s): "GLUCAP" in the last 72 hours.    Coagulation Profile: No results for input(s): "INR", "PROTIME" in the last 168 hours.   Radiology Studies: ECHOCARDIOGRAM COMPLETE  Result Date: 09/04/2022    ECHOCARDIOGRAM REPORT   Patient Name:   Jason [name] [facility] Date of Exam: 09/04/2022 Medical Rec #:  [id number]         Height:       70.0 in Accession #:    [id number]        Weight:       156.5 lb Date of Birth:  1925/10/11         BSA:          1.881 m Patient Age:    48 years          BP:           97/62  mmHg Patient Gender: M                 HR:           85 bpm. Exam Location:  Inpatient Procedure: 2D Echo, Cardiac Doppler and Color Doppler Indications:    Bacteremia  History:        Patient has prior history of Echocardiogram examinations. CAD,                 Prior CABG, Aortic Valve Disease, Arrythmias:Atrial                 Fibrillation; Risk Factors:Dyslipidemia.                 Aortic Valve: 23 mm Magna porcine valve is present in the aortic                 position. Procedure Date: 05/2010.  Sonographer:    Meagan Baucom RDCS, FE, PE Referring Phys: Mariano Colon  1. Left ventricular ejection fraction, by estimation, is 60 to 65%. The left ventricle has normal function. The left ventricle has no regional wall motion abnormalities. There is moderate left ventricular hypertrophy. Left ventricular diastolic parameters are indeterminate.  2. Right ventricular systolic function is normal. The right ventricular size is normal.  3. Left atrial size was moderately dilated.  4. Right atrial size was moderately dilated.  5.  The mitral valve has been repaired/replaced. Mild to moderate mitral valve regurgitation. No evidence of mitral stenosis. Severe mitral annular calcification.  6. The aortic valve has been repaired/replaced. Aortic valve regurgitation is not visualized. No aortic stenosis is present. There is a 23 mm Magna porcine valve present in the aortic position. Procedure Date: 05/2010. Echo findings are consistent with normal structure and function of the aortic valve prosthesis. Aortic valve area, by VTI measures 1.25 cm. Aortic valve mean gradient measures 16.0 mmHg. Aortic valve Vmax measures 2.51 m/s.  7. The inferior vena cava is normal in size with greater than 50% respiratory variability, suggesting right atrial pressure of 3 mmHg. Comparison(s): No significant change from prior study. Prior images reviewed side by side. Conclusion(s)/Recommendation(s): No evidence of valvular  vegetations on this transthoracic echocardiogram. Consider a transesophageal echocardiogram to exclude infective endocarditis if clinically indicated. FINDINGS  Left Ventricle: Left ventricular ejection fraction, by estimation, is 60 to 65%. The left ventricle has normal function. The left ventricle has no regional wall motion abnormalities. The left ventricular internal cavity size was normal in size. There is  moderate left ventricular hypertrophy. Left ventricular diastolic parameters are indeterminate. Right Ventricle: The right ventricular size is normal. No increase in right ventricular wall thickness. Right ventricular systolic function is normal. Left Atrium: Left atrial size was moderately dilated. Right Atrium: Right atrial size was moderately dilated. Pericardium: There is no evidence of pericardial effusion. Mitral Valve: The mitral valve has been repaired/replaced. There is mild thickening of the mitral valve leaflet(s). Severe mitral annular calcification. Mild to moderate mitral valve regurgitation. No evidence of mitral valve stenosis. MV peak gradient, 7.2 mmHg. The mean mitral valve gradient is 3.0 mmHg. Tricuspid Valve: The tricuspid valve is normal in structure. Tricuspid valve regurgitation is mild . No evidence of tricuspid stenosis. Aortic Valve: The aortic valve has been repaired/replaced. Aortic valve regurgitation is not visualized. No aortic stenosis is present. Aortic valve mean gradient measures 16.0 mmHg. Aortic valve peak gradient measures 25.2 mmHg. Aortic valve area, by VTI measures 1.25 cm. There is a 23 mm Magna porcine valve present in the aortic position. Procedure Date: 05/2010. Echo findings are consistent with normal structure and function of the aortic valve prosthesis. Pulmonic Valve: The pulmonic valve was normal in structure. Pulmonic valve regurgitation is not visualized. No evidence of pulmonic stenosis. Aorta: The aortic root is normal in size and structure. Venous:  The inferior vena cava is normal in size with greater than 50% respiratory variability, suggesting right atrial pressure of 3 mmHg. IAS/Shunts: No atrial level shunt detected by color flow Doppler.  LEFT VENTRICLE PLAX 2D LVIDd:         4.00 cm   Diastology LVIDs:         2.90 cm   LV e' medial:    7.62 cm/s LV PW:         1.40 cm   LV E/e' medial:  13.9 LV IVS:        1.60 cm   LV e' lateral:   9.68 cm/s LVOT diam:     2.20 cm   LV E/e' lateral: 11.0 LV SV:         63 LV SV Index:   33 LVOT Area:     3.80 cm  RIGHT VENTRICLE RV S prime:     8.70 cm/s TAPSE (M-mode): 1.6 cm LEFT ATRIUM              Index  RIGHT ATRIUM           Index LA diam:        4.10 cm  2.18 cm/m   RA Area:     26.50 cm LA Vol (A2C):   119.0 ml 63.27 ml/m  RA Volume:   89.40 ml  47.53 ml/m LA Vol (A4C):   92.3 ml  49.07 ml/m LA Biplane Vol: 105.0 ml 55.83 ml/m  AORTIC VALVE AV Area (Vmax):    1.33 cm AV Area (Vmean):   1.20 cm AV Area (VTI):     1.25 cm AV Vmax:           251.00 cm/s AV Vmean:          188.000 cm/s AV VTI:            0.503 m AV Peak Grad:      25.2 mmHg AV Mean Grad:      16.0 mmHg LVOT Vmax:         87.50 cm/s LVOT Vmean:        59.300 cm/s LVOT VTI:          0.165 m LVOT/AV VTI ratio: 0.33  AORTA Ao Root diam: 3.60 cm Ao Asc diam:  3.20 cm MITRAL VALVE                TRICUSPID VALVE MV Area (PHT): 4.06 cm     TR Peak grad:   38.4 mmHg MV Area VTI:   2.43 cm     TR Vmax:        310.00 cm/s MV Peak grad:  7.2 mmHg MV Mean grad:  3.0 mmHg     SHUNTS MV Vmax:       1.34 m/s     Systemic VTI:  0.16 m MV Vmean:      79.6 cm/s    Systemic Diam: 2.20 cm MV Decel Time: 187 msec MV E velocity: 106.00 cm/s Candee Furbish MD Electronically signed by Candee Furbish MD Signature Date/Time: 09/04/2022/11:53:44 AM    Final    CT MAXILLOFACIAL W CONTRAST  Result Date: 09/03/2022 CLINICAL DATA:  Concern for sublingual/submandibular abscess EXAM: CT MAXILLOFACIAL WITH CONTRAST TECHNIQUE: Multidetector CT imaging of the  maxillofacial structures was performed with intravenous contrast. Multiplanar CT image reconstructions were also generated. RADIATION DOSE REDUCTION: This exam was performed according to the departmental dose-optimization program which includes automated exposure control, adjustment of the mA and/or kV according to patient size and/or use of iterative reconstruction technique. CONTRAST:  143m OMNIPAQUE IOHEXOL 300 MG/ML  SOLN COMPARISON:  None Available. FINDINGS: Evaluation is somewhat limited by beam hardening artifact from extensive dental hardware. Osseous: No fracture or mandibular dislocation. No destructive process. Evaluation for periapical lucencies is limited by aforementioned beam hardening artifact. There is extension of the root of the left canine dental implant through the anterior aspect of the maxilla (series 8, image 58 and series 4, image 43), of unknown clinical significance. Orbits: Negative. No traumatic or inflammatory finding. Status post bilateral lens replacements. Sinuses: Near complete opacification of the bilateral maxillary sinuses, with additional opacification of the right greater than left ethmoid air cells and frontal sinuses. The mastoids are well aerated. Soft tissues: Normal appearance of the parotid and submandibular glands. No evidence of soft tissue collection. Limited intracranial: No significant or unexpected finding. IMPRESSION: 1. Evaluation is somewhat limited by beam hardening artifact from extensive dental hardware. 2. 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 3. No evidence of soft tissue collection. No evidence of sublingual or submandibular abscess 4. Near complete opacification of the bilateral maxillary sinuses, with additional opacification of the right greater than left ethmoid air cells and frontal sinuses. Correlate for sinusitis.  Electronically Signed   By: Merilyn Baba M.D.   On: 09/03/2022 19:12       Alma Friendly M.D. [facility] 09/04/2022, 5:58 PM  Available via Epic secure chat 7am-7pm After 7 pm, please refer to night coverage provider listed on amion.

## 2022-09-04 NOTE — Care Management Important Message (Signed)
Important Message  Patient Details IM Letter given. Name: Jason [name] MRN: [id number] Date of Birth: Oct 10, 1925   Medicare Important Message Given:  Yes     Kerin Salen 09/04/2022, 1:41 PM

## 2022-09-05 ENCOUNTER — Telehealth (HOSPITAL_COMMUNITY): Payer: Self-pay | Admitting: Physician Assistant

## 2022-09-05 DIAGNOSIS — Z8639 Personal history of other endocrine, nutritional and metabolic disease: Secondary | ICD-10-CM | POA: Diagnosis not present

## 2022-09-05 DIAGNOSIS — K31811 Angiodysplasia of stomach and duodenum with bleeding: Secondary | ICD-10-CM | POA: Diagnosis not present

## 2022-09-05 DIAGNOSIS — E440 Moderate protein-calorie malnutrition: Secondary | ICD-10-CM | POA: Insufficient documentation

## 2022-09-05 DIAGNOSIS — D62 Acute posthemorrhagic anemia: Secondary | ICD-10-CM | POA: Diagnosis not present

## 2022-09-05 DIAGNOSIS — K5520 Angiodysplasia of colon without hemorrhage: Secondary | ICD-10-CM | POA: Diagnosis not present

## 2022-09-05 LAB — CBC WITH DIFFERENTIAL/PLATELET
Abs Immature Granulocytes: 0.06 10*3/uL (ref 0.00–0.07)
Basophils Absolute: 0 10*3/uL (ref 0.0–0.1)
Basophils Relative: 0 %
Eosinophils Absolute: 0.1 10*3/uL (ref 0.0–0.5)
Eosinophils Relative: 1 %
HCT: 26.3 % — ABNORMAL LOW (ref 39.0–52.0)
Hemoglobin: 7.8 g/dL — ABNORMAL LOW (ref 13.0–17.0)
Immature Granulocytes: 1 %
Lymphocytes Relative: 10 %
Lymphs Abs: 0.9 10*3/uL (ref 0.7–4.0)
MCH: 26.9 pg (ref 26.0–34.0)
MCHC: 29.7 g/dL — ABNORMAL LOW (ref 30.0–36.0)
MCV: 90.7 fL (ref 80.0–100.0)
Monocytes Absolute: 1 10*3/uL (ref 0.1–1.0)
Monocytes Relative: 11 %
Neutro Abs: 7 10*3/uL (ref 1.7–7.7)
Neutrophils Relative %: 77 %
Platelets: 194 10*3/uL (ref 150–400)
RBC: 2.9 MIL/uL — ABNORMAL LOW (ref 4.22–5.81)
RDW: 20.8 % — ABNORMAL HIGH (ref 11.5–15.5)
WBC: 9.1 10*3/uL (ref 4.0–10.5)
nRBC: 0 % (ref 0.0–0.2)

## 2022-09-05 LAB — BASIC METABOLIC PANEL
Anion gap: 5 (ref 5–15)
BUN: 11 mg/dL (ref 8–23)
CO2: 27 mmol/L (ref 22–32)
Calcium: 7.9 mg/dL — ABNORMAL LOW (ref 8.9–10.3)
Chloride: 100 mmol/L (ref 98–111)
Creatinine, Ser: 0.78 mg/dL (ref 0.61–1.24)
GFR, Estimated: >60 mL/min (ref >60)
Glucose, Bld: 97 mg/dL (ref 70–99)
Potassium: 3.6 mmol/L (ref 3.5–5.1)
Sodium: 132 mmol/L — ABNORMAL LOW (ref 135–145)

## 2022-09-05 MED ORDER — SODIUM CHLORIDE 0.9 % IV SOLN: 2.0000 g | INTRAVENOUS | Status: DC

## 2022-09-05 MED ORDER — ENSURE ENLIVE PO LIQD
237.0000 mL | Freq: Three times a day (TID) | ORAL | Status: DC
  Administered 2022-09-05 – 2022-09-15 (×12): 237 mL via ORAL

## 2022-09-05 MED ORDER — SODIUM CHLORIDE 0.9 % IV SOLN
2.0000 g | Freq: Two times a day (BID) | INTRAVENOUS | Status: DC
  Administered 2022-09-05 – 2022-09-14 (×20): 2 g via INTRAVENOUS
  Filled 2022-09-05 (×21): qty 20

## 2022-09-05 NOTE — Progress Notes (Signed)
Initial Nutrition Assessment  DOCUMENTATION CODES:   Non-severe (moderate) malnutrition in context of chronic illness  INTERVENTION:   -Ensure Plus High Protein po TID, each supplement provides 350 kcal and 20 grams of protein.   NUTRITION DIAGNOSIS:   Moderate Malnutrition related to chronic illness (vertebral osteomyelitis) as evidenced by moderate fat depletion, mild muscle depletion, energy intake < or equal to 50% for > or equal to 1 month, percent weight loss.  GOAL:   Patient will meet greater than or equal to 90% of their needs  MONITOR:   PO intake, Supplement acceptance, Labs, Weight trends, I & O's, Skin  REASON FOR ASSESSMENT:   Malnutrition Screening Tool    ASSESSMENT:   89 year old gentleman prior history of coronary artery disease s/p CABG, AVR, chronic atrial fibrillation on anticoagulation with Eliquis, obstructive sleep apnea on CPAP, bladder carcinoma s/p TURB, prostate cancer, history of GI bleed with presence of gastric and small bowel AVMs on endoscopy, history of colon cancer s/p right hemicolectomy presents with generalized weakness and fatigue.  Patient in room, wife at bedside. Pt lying in bed, legally blind. Pt's wife reports his appetite has been significantly decreased for 6 weeks now. Pt used to consume 3 meals a day. Now eats a small breakfast and lunch and no dinner meal. Pt requires feeding assistance. Consuming ~25% of meals. 2-3 bites of each item off meal trays. Pt likes Ensure and was drinking ~1 each day at home. Will order TID to provide additional kcals and protein.  Per weight records, pt has lost  13 lb since 07/12/22 (7% wt loss x 1.5 months, significant for time frame).  Pt states his UBW is ~170 lbs  Medications: OSCAL w/ D, Vitamin D, Vitamin B-12, Multivitamin with minerals daily, Miralax, Metamucil, Senokot, Carafate  Labs reviewed: Low Na  NUTRITION - FOCUSED PHYSICAL EXAM:  Flowsheet Row Most Recent Value  Orbital Region  Moderate depletion  Upper Arm Region Moderate depletion  Thoracic and Lumbar Region Mild depletion  Buccal Region Moderate depletion  Temple Region Moderate depletion  Clavicle Bone Region Mild depletion  Clavicle and Acromion Bone Region Mild depletion  Scapular Bone Region Mild depletion  Dorsal Hand Mild depletion  Patellar Region Mild depletion  Anterior Thigh Region Mild depletion  Posterior Calf Region Mild depletion  Edema (RD Assessment) None  Hair Reviewed  Eyes Unable to assess  [kept eyes closed, legally blind]  Mouth Reviewed  Skin Reviewed       Diet Order:   Diet Order             Diet Heart Room service appropriate? Yes; Fluid consistency: Thin  Diet effective now                   EDUCATION NEEDS:   Education needs have been addressed  Skin:  Skin Assessment: Skin Integrity Issues: Skin Integrity Issues:: Stage II Stage II: vertebral column  Last BM:  1/8  Height:   Ht Readings from Last 1 Encounters:  08/29/22 '5\' 10"'$  (1.778 m)    Weight:   Wt Readings from Last 1 Encounters:  09/02/22 71 kg    BMI:  Body mass index is 22.46 kg/m.  Estimated Nutritional Needs:   Kcal:  1600-1800  Protein:  70-80g  Fluid:  1.6L/day   Clayton Bibles, MS, RD, LDN Inpatient Clinical Dietitian Contact information available via Amion

## 2022-09-05 NOTE — Progress Notes (Deleted)
d Cardiology Office Note:    Date:  09/05/2022   ID:  Jason [name], DOB 1926/08/25, MRN [id number]  PCP:  [name], MD   [facility] Cardiologist:  Jule Whitsel Martinique, MD Electrophysiologist:  Constance Haw, MD     Referring MD: [name], MD   No chief complaint on file.   History of Present Illness:    Jason [name] is a 89 y.o. male with a hx of CAD s/p CABG with AVR in October 2011, history of prostate cancer, hyperlipidemia, OSA on CPAP and chronic atrial fibrillation.  When he had AVR with tissue valve prosthesis, he underwent LIMA to LAD.  At the time, RCA had a 40 to 50% stenosis that was treated medically.  He has been anticoagulated with Xarelto. He was admitted in January 2016 with GI bleed.  His Xarelto was held.  He did not require blood transfusion.  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  Afterward, he was placed on Coumadin.  He had recurrent anemia in May 2017 and required blood transfusion.  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  Afterward, he returned with complaint of GI bleed.  He was evaluated by Dr. Curt Bears in March 2018, beta-blocker was increased.  There was also some wide-complex tachycardia consistent with aberrancy versus VT.  He was transitioned to Eliquis. On 2.5 mg bid due to age and weight.   He was last seen in September 2022 by Almyra Deforest PA-C and doing well.   More recently he had hematuria. CT showed nodular area in his bladder. Bone scan was negative. Hematuria has resolved. He is seeing Dr Lovena Neighbours and has been diagnosed with bladder cancer and is being considered for cystoscopic surgery.   He is living at [facility]. He is active- goes to gym at least twice a week works out with weight machines and aerobic exercise. No chest pain, dyspnea, palpitations. No dizziness.    Past Medical History:  Diagnosis Date   Abnormal PSA    Adenomatous polyp of colon 2010 and before 2006    Anemia    Anxiety    Aortic stenosis    Aortic valve prosthesis present    Atrial fibrillation (HCC)    CAD (coronary artery disease)    DDD (degenerative disc disease), lumbar    Depression    Diverticulosis    Dyslipidemia    GERD (gastroesophageal reflux disease)    Glaucoma    History of blood transfusion 09/29/2016   History of kidney stones    Hx of CABG    Hypogonadism, male    Macular degeneration    OSA (obstructive sleep apnea)    Osteoarthritis    Osteopenia    Prostate CA ([place]) prostate 2007   colon dx 2018, watching psa levels, no tx yet for prostate    Past Surgical History:  Procedure Laterality Date   AORTIC VALVE REPLACEMENT  October 2011   Magna Ease pericardial tissue valve #29m   BACK SURGERY  1978   lower   BIOPSY  08/31/2022   Procedure: BIOPSY;  Surgeon: MIrving Copas, MD;  Location: [facility];  Service: Gastroenterology;;   CATARACT EXTRACTION Bilateral    CORNEAL TRANSPLANT Right    CORONARY ARTERY BYPASS GRAFT  October 2011   LIMA to LAD   ENTEROSCOPY N/A 08/31/2022   Procedure: ENTEROSCOPY;  Surgeon: MIrving Copas, MD;  [facility];  Service: Gastroenterology;  Laterality: N/A;  ESOPHAGOGASTRODUODENOSCOPY N/A 09/16/2014   Procedure: ESOPHAGOGASTRODUODENOSCOPY (EGD);  Surgeon: [name], MD;  Location[facility];  Service: Endoscopy;  Laterality: N/A;   HEMOSTASIS CLIP PLACEMENT  08/31/2022   Procedure: HEMOSTASIS CLIP PLACEMENT;  Surgeon: Irving Copas., MD;  Location: [facility];  Service: Gastroenterology;;   HOT HEMOSTASIS N/A 08/31/2022   Procedure: HOT HEMOSTASIS (ARGON PLASMA COAGULATION/BICAP);  Surgeon: Irving Copas., MD;  [facility];  Service: Gastroenterology;  Laterality: N/A;   SUBMUCOSAL TATTOO INJECTION  08/31/2022   Procedure: SUBMUCOSAL TATTOO INJECTION;  Surgeon: Rush Landmark Telford Nab., MD;  Location: [facility];  Service: Gastroenterology;;   TONSILLECTOMY  76  yrs ago   TRANSURETHRAL RESECTION OF BLADDER TUMOR Right 01/04/2022   Procedure: TRANSURETHRAL RESECTION OF BLADDER TUMOR (TURBT) WITH POST OPERATIVE INSTILLATION OF GEMCITABINE/ POSSIBLE RIGHT STENT PLACEMENT;  Surgeon: Ceasar Mons, MD;  Location: [facility];  Service: Urology;  Laterality: Right;    Current Medications: No outpatient medications have been marked as taking for the 09/11/22 encounter (Appointment) with Martinique, Copelan Maultsby M, MD.     Allergies:   Xarelto [rivaroxaban]   Social History   Socioeconomic History   Marital status: Married    Spouse name: Izora Gala   Number of children: 2   Years of education: Not on file   Highest education level: Not on file  Occupational History   Not on file  Tobacco Use   Smoking status: Former    Packs/day: 1.00    Years: 40.00    Total pack years: 40.00    Types: Cigarettes    Quit date: 11/16/1990    Years since quitting: 31.8   Smokeless tobacco: Never  Vaping Use   Vaping Use: Never used  Substance and Sexual Activity   Alcohol use: Yes    Alcohol/week: 2.0 standard drinks of alcohol    Types: 2 Glasses of wine per week    Comment: wine few x per week   Drug use: No   Sexual activity: Not on file  Other Topics Concern   Not on file  Social History Narrative   Right handed   Caffeine use: coffee/tea (2-3 cups coffee per day, 1 tea per day)   Lives with wife   Social Determinants of Health   Financial Resource Strain: Not on file  Food Insecurity: No Food Insecurity (09/01/2022)   Hunger Vital Sign    Worried About Running Out of Food in the Last Year: Never true    Ran Out of Food in the Last Year: Never true  Transportation Needs: No Transportation Needs (09/01/2022)   PRAPARE - Hydrologist (Medical): No    Lack of Transportation (Non-Medical): No  Physical Activity: Not on file  Stress: Not on file  Social Connections: Not on file     Family History: The patient's family history  includes Heart attack in his father. There is no history of Colon cancer, Esophageal cancer, Stomach cancer, or Rectal cancer.  ROS:   Please see the history of present illness.     All other systems reviewed and are negative.  EKGs/Labs/Other Studies Reviewed:    The following studies were reviewed today:  Echo 07/17/2014 LV EF: 55% -   60%  Study Conclusions   - Left ventricle: The cavity size was normal. Wall thickness was    increased in a pattern of mild LVH. There was mild concentric    hypertrophy. Systolic function was normal. The estimated ejection  fraction was in the range of 55% to 60%. Wall motion was normal;    there were no regional wall motion abnormalities.  - Ventricular septum: Septal motion showed paradox.  - Aortic valve: A bioprosthesis was present and functioning    normally. Valve area (VTI): 1.6 cm^2. Valve area (Vmax): 1.5    cm^2. Valve area (Vmean): 1.33 cm^2.  - Mitral valve: Calcified, moderately to severely fibrotic annulus.    Mildly thickened leaflets . The findings are consistent with    trivial stenosis. There was mild regurgitation directed    centrally. Valve area by continuity equation (using LVOT flow):    2.6 cm^2.  - Left atrium: The atrium was moderately dilated.  - Right atrium: The atrium was mildly dilated.   EKG:  EKG is not ordered today.    Recent Labs: 09/05/2022: BUN 11; Creatinine, Ser 0.78; Hemoglobin 7.8; Platelets 194; Potassium 3.6; Sodium 132  Recent Lipid Panel    Component Value Date/Time   CHOL 89 01/03/2012 0835   TRIG 125.0 01/03/2012 0835   HDL 37.40 (L) 01/03/2012 0835   CHOLHDL 2 01/03/2012 0835   VLDL 25.0 01/03/2012 0835   LDLCALC 27 01/03/2012 0835   Dated 01/20/21: cholesterol 110, triglycerides 148, HDL 37, LDL 43. CMET normal Dated 08/08/21: normal CBC. Dated 03/21/22: cholesterol 103, triglycerides 111, HDL 48, LDL 33. A1c 5.7%. CMET and TSH normal.    Risk Assessment/Calculations:    CHA2DS2-VASc  Score =     This indicates a  % annual risk of stroke. The patient's score is based upon:      {This patient has a significant risk of stroke if diagnosed with atrial fibrillation.  Please consider VKA or DOAC agent for anticoagulation if the bleeding risk is acceptable.   You can also use the SmartPhrase .[place] for documentation.   [id number]       Physical Exam:    VS:  There were no vitals taken for this visit.    Wt Readings from Last 3 Encounters:  09/02/22 156 lb 8.4 oz (71 kg)  07/14/22 170 lb (77.1 kg)  07/12/22 170 lb (77.1 kg)     GEN:  Well nourished, well developed in no acute distress HEENT: Normal NECK: No JVD; No carotid bruits LYMPHATICS: No lymphadenopathy CARDIAC: Irregularly irregular, no murmurs, rubs, gallops RESPIRATORY:  Clear to auscultation without rales, wheezing or rhonchi  ABDOMEN: Soft, non-tender, non-distended MUSCULOSKELETAL:  No edema; No deformity  SKIN: Warm and dry NEUROLOGIC:  Alert and oriented x 3 PSYCHIATRIC:  Normal affect   ASSESSMENT:    No diagnosis found.   PLAN:    In order of problems listed above:  Permanent atrial fibrillation: rate is well controlled and he is asymptomatic. Continue Eliquis at reduced dose of 2.5 mg bid and low dose metoprolol.    CAD s/p CABG: Denies any recent chest pain.  Not on aspirin given the need for Eliquis.  History of AVR: No significant heart murmur on physical exam. Last Echo in 2015 showed normal valve function  Hyperlipidemia: Continue Crestor.  5.   Bladder cancer. Patient is cleared for planned cystoscopic procedure from my standpoint. May hold Eliquis for 3 days prior and resume when bleeding controlled. Overall procedure is low risk and CV risk is low based on lack of symptoms and current functional status.          Medication Adjustments/Labs and Tests Ordered: Current medicines are reviewed at length with the patient today.  Concerns regarding medicines  are  outlined above.  No orders of the defined types were placed in this encounter.  No orders of the defined types were placed in this encounter.    There are no Patient Instructions on file for this visit.   Signed, Brenden Rudman Martinique, MD  09/05/2022 5:40 PM    [facility]

## 2022-09-05 NOTE — Progress Notes (Signed)
ID brief note   Remains afebrile, no leukocytosis Repeat blood cx 1/7 NG in 2 days   TTE 1/8 no vegetations or endocarditis  Will change ceftriaxone from daily to BID dosing given arachnoiditis seen in MRI L spine  Cards notified for TEE, tentatively planned for next Monday Will see in person tomorrow.   Rosiland Oz, MD Infectious Disease Physician [facility] [address] [place], [place] [address] Phone: [phone]  Fax: [phone]

## 2022-09-05 NOTE — Progress Notes (Signed)
[facility]                                                                               Jason [name], is a 89 y.o. male, DOB - 03/21/1926, TOI:[id number] Admit date - 08/29/2022    Outpatient Primary MD for the patient is [name], MD  LOS - 7  days    Brief summary    89 year old gentleman prior history of coronary artery disease s/p CABG, AVR, chronic atrial fibrillation on anticoagulation with Eliquis, obstructive sleep apnea on CPAP, bladder carcinoma s/p TURB, prostate cancer, history of GI bleed with presence of gastric and small bowel AVMs on endoscopy, history of colon cancer s/p right hemicolectomy presents with generalized weakness and fatigue and was found to have a hemoglobin of 8.6 from baseline of 13   Assessment & Plan    Assessment and Plan:  Acute blood loss anemia likely secondary to GI bleed from AVMs  Patient presented with a hemoglobin of 8 which has dropped from a baseline around 13 CT of the abdomen and pelvis with contrast does not show any metastatic disease in the abdomen GI on board: SBE done on 08/31/22 showed multiple AVMs in the stomach and 1 duodenal AVM which were ablated. Noted ulceration in the fundus of the stomach which was biopsied  Anemia panel showed iron 15, saturation 5 On IV iron supplements X 4 doses, plan to discharge on p.o. Holding Eliquis for now Continue Protonix 40 mg BID, start Carafate as per GI  Strep infantarius bacteremia (bovis family) Likely from ??discitis, vertebral osteomyelitis, reports significant back pain  Noted persistent hypotension, SBP's in the 80s Afebrile, with no leukocytosis LA WNL BC X 2, now growing strep species, repeat pending Repeat MRI lumbar concerning for L1-L2 discitis/osteomyelitis, no evidence of epidural collection, edema at the bilateral psoas muscles, without any focal collection, concerning for arachnoiditis ID consulted, appreciate recs, plan to rule out prosthetic valve  endocarditis noted poor dentition, rule out recurrence of colon cancer TTE no evidence of vegetation, valve in place, EF 60 to 65%, no regional wall motion abnormality, TEE pending to be done on 09/11/22 Colonoscopy as an outpatient to rule out recurrence of his colon cancer CT maxillofacial to ruled out sublingual/submandibular abscess Continue IV ceftriaxone BID as per ID for 14 days, then continue daily  Hx of CABG, AVR Patient currently denies any chest pain or shortness of breath. Anticoagulation on hold  Atrial fibrillation, chronic (HCC) Held metoprolol 12.5 mg twice daily as BP soft Eliquis on hold due to GI bleed  OSA (obstructive sleep apnea) On CPAP  Chronic back pain Pain management Outpatient appointment with neurosurgery was rescheduled with Dr. Saintclair Halsted by wife   Hyperlipidemia Continue with Crestor 5 mg 3 times a week     Pressure ulcer present on admission RN Pressure Injury Documentation: Pressure Injury 08/29/22 Vertebral column Lower Stage 2 -  Partial thickness loss of dermis presenting as a shallow open injury with a red, pink wound bed without slough. two quarter sized pressure wounds over vertebra (Active)  08/29/22 1410  Location: Vertebral column  Location Orientation: Lower  Staging: Stage 2 -  Partial thickness loss  of dermis presenting as a shallow open injury with a red, pink wound bed without slough.  Wound Description (Comments): two quarter sized pressure wounds over vertebra  Present on Admission: Yes  Dressing Type Foam - Lift dressing to assess site every shift;Impregnated gauze (bismuth) 09/05/22 1400   Foam wound dressing in place    Estimated body mass index is 22.46 kg/m as calculated from the following:   Height as of this encounter: '5\' 10"'$  (1.778 m).   Weight as of this encounter: 71 kg.  Code Status: DNR DVT Prophylaxis:  SCDs Start: 08/29/22 2210   Level of Care: Level of care: Progressive Family Communication: Wife at bedside.    Disposition Plan:     Remains inpatient appropriate: Level of care  Procedures:  Small bowel enteroscopy   Consultants:   Gastroenterology ID    Antimicrobials:   Anti-infectives (From admission, onward)    Start     Dose/Rate Route Frequency Ordered Stop   09/19/22 1000  cefTRIAXone (ROCEPHIN) 2 g in sodium chloride 0.9 % 100 mL IVPB        2 g 200 mL/hr over 30 Minutes Intravenous Every 24 hours 09/05/22 1003     09/05/22 1100  cefTRIAXone (ROCEPHIN) 2 g in sodium chloride 0.9 % 100 mL IVPB        2 g 200 mL/hr over 30 Minutes Intravenous Every 12 hours 09/05/22 1003 09/19/22 0959   09/02/22 1500  cefTRIAXone (ROCEPHIN) 2 g in sodium chloride 0.9 % 100 mL IVPB  Status:  Discontinued        2 g 200 mL/hr over 30 Minutes Intravenous Every 24 hours 09/02/22 1344 09/05/22 1003        Medications  Scheduled Meds:  amitriptyline  25 mg Oral QHS   brimonidine  1 drop Both Eyes BID   calcium-vitamin D  1 tablet Oral Daily   cholecalciferol  1,000 Units Oral Daily   cyanocobalamin  1,000 mcg Oral Daily   dorzolamide-timolol  1 drop Both Eyes BID   feeding supplement  237 mL Oral TID BM   gabapentin  300 mg Oral QID   ipratropium  2 spray Each Nare BID   latanoprost  1 drop Both Eyes QHS   lidocaine  1 patch Transdermal Q24H   multivitamin with minerals  1 tablet Oral Daily   pantoprazole  40 mg Oral BID AC   perphenazine  2 mg Oral Once per day on Mon Wed Fri   polyethylene glycol  17 g Oral Daily   prednisoLONE acetate  1 drop Right Eye Daily   psyllium  1 packet Oral Daily   rosuvastatin  5 mg Oral Q M,W,F   senna-docusate  1 tablet Oral QHS   sucralfate  1 g Oral BID   Continuous Infusions:  cefTRIAXone (ROCEPHIN)  IV 2 g (09/05/22 1053)   [START ON 09/19/2022] cefTRIAXone (ROCEPHIN)  IV      PRN Meds:.guaiFENesin, metoprolol tartrate, mouth rinse, oxyCODONE, traMADol    Subjective:   Jason [name] denies any new complaints. Still with significant  back pain upon ambulation    Objective:   Vitals:   09/04/22 2055 09/04/22 2056 09/05/22 0524 09/05/22 1242  BP: 119/78  122/83 111/72  Pulse: (!) 105 68 99 (!) 104  Resp: '12  16 18  '$ Temp: 97.7 F (36.5 C)  98.1 F (36.7 C) 98.6 F (37 C)  TempSrc: Oral  Oral Oral  SpO2: 93% 95% 95% 96%  Weight:  Height:        Intake/Output Summary (Last 24 hours) at 09/05/2022 1839 Last data filed at 09/05/2022 1400 Gross per 24 hour  Intake 784.5 ml  Output 1651 ml  Net -866.5 ml   Filed Weights   08/29/22 1305 08/29/22 2044 09/02/22 0634  Weight: 77.1 kg 69.4 kg 71 kg     Exam General: NAD, legally blind, very deconditioned  Cardiovascular: S1, S2 present Respiratory: CTAB Abdomen: Soft, nontender, nondistended, bowel sounds present Musculoskeletal: No bilateral pedal edema noted Skin: Pressure ulcer noted Psychiatry: Fair mood       Data Reviewed:  I have personally reviewed following labs and imaging studies   CBC Lab Results  Component Value Date   WBC 9.1 09/05/2022   RBC 2.90 (L) 09/05/2022   HGB 7.8 (L) 09/05/2022   HCT 26.3 (L) 09/05/2022   MCV 90.7 09/05/2022   MCH 26.9 09/05/2022   PLT 194 09/05/2022   MCHC 29.7 (L) 09/05/2022   RDW 20.8 (H) 09/05/2022   LYMPHSABS 0.9 09/05/2022   MONOABS 1.0 09/05/2022   EOSABS 0.1 09/05/2022   BASOSABS 0.0 59/16/3846     Last metabolic panel Lab Results  Component Value Date   NA 132 (L) 09/05/2022   K 3.6 09/05/2022   CL 100 09/05/2022   CO2 27 09/05/2022   BUN 11 09/05/2022   CREATININE 0.78 09/05/2022   GLUCOSE 97 09/05/2022   GFRNONAA >60 09/05/2022   GFRAA >60 11/01/2016   CALCIUM 7.9 (L) 09/05/2022   PROT 7.5 04/11/2017   ALBUMIN 3.9 04/11/2017   LABGLOB 3.2 03/29/2016   BILITOT 0.91 04/11/2017   ALKPHOS 54 04/11/2017   AST 22 04/11/2017   ALT 19 04/11/2017   ANIONGAP 5 09/05/2022    CBG (last 3)  No results for input(s): "GLUCAP" in the last 72 hours.    Coagulation Profile: No  results for input(s): "INR", "PROTIME" in the last 168 hours.   Radiology Studies: ECHOCARDIOGRAM COMPLETE  Result Date: 09/04/2022    ECHOCARDIOGRAM REPORT   Patient Name:   [facility] Date of Exam: 09/04/2022 Medical Rec #:  [id number]         Height:       70.0 in Accession #:    [id number]        Weight:       156.5 lb Date of Birth:  [birth year]         BSA:          1.881 m Patient Age:    69 years          BP:           97/62 mmHg Patient Gender: M                 HR:           85 bpm. Exam Location:  Inpatient Procedure: 2D Echo, Cardiac Doppler and Color Doppler Indications:    Bacteremia  History:        Patient has prior history of Echocardiogram examinations. CAD,                 Prior CABG, Aortic Valve Disease, Arrythmias:Atrial                 Fibrillation; Risk Factors:Dyslipidemia.                 Aortic Valve: 23 mm Magna porcine valve is present in the aortic  position. Procedure Date: 05/2010.  Sonographer:    Meagan Baucom RDCS, FE, PE Referring Phys: [place]  1. Left ventricular ejection fraction, by estimation, is 60 to 65%. The left ventricle has normal function. The left ventricle has no regional wall motion abnormalities. There is moderate left ventricular hypertrophy. Left ventricular diastolic parameters are indeterminate.  2. Right ventricular systolic function is normal. The right ventricular size is normal.  3. Left atrial size was moderately dilated.  4. Right atrial size was moderately dilated.  5. The mitral valve has been repaired/replaced. Mild to moderate mitral valve regurgitation. No evidence of mitral stenosis. Severe mitral annular calcification.  6. The aortic valve has been repaired/replaced. Aortic valve regurgitation is not visualized. No aortic stenosis is present. There is a 23 mm Magna porcine valve present in the aortic position. Procedure Date: 05/2010. Echo findings are consistent with normal structure and function  of the aortic valve prosthesis. Aortic valve area, by VTI measures 1.25 cm. Aortic valve mean gradient measures 16.0 mmHg. Aortic valve Vmax measures 2.51 m/s.  7. The inferior vena cava is normal in size with greater than 50% respiratory variability, suggesting right atrial pressure of 3 mmHg. Comparison(s): No significant change from prior study. Prior images reviewed side by side. Conclusion(s)/Recommendation(s): No evidence of valvular vegetations on this transthoracic echocardiogram. Consider a transesophageal echocardiogram to exclude infective endocarditis if clinically indicated. FINDINGS  Left Ventricle: Left ventricular ejection fraction, by estimation, is 60 to 65%. The left ventricle has normal function. The left ventricle has no regional wall motion abnormalities. The left ventricular internal cavity size was normal in size. There is  moderate left ventricular hypertrophy. Left ventricular diastolic parameters are indeterminate. Right Ventricle: The right ventricular size is normal. No increase in right ventricular wall thickness. Right ventricular systolic function is normal. Left Atrium: Left atrial size was moderately dilated. Right Atrium: Right atrial size was moderately dilated. Pericardium: There is no evidence of pericardial effusion. Mitral Valve: The mitral valve has been repaired/replaced. There is mild thickening of the mitral valve leaflet(s). Severe mitral annular calcification. Mild to moderate mitral valve regurgitation. No evidence of mitral valve stenosis. MV peak gradient, 7.2 mmHg. The mean mitral valve gradient is 3.0 mmHg. Tricuspid Valve: The tricuspid valve is normal in structure. Tricuspid valve regurgitation is mild . No evidence of tricuspid stenosis. Aortic Valve: The aortic valve has been repaired/replaced. Aortic valve regurgitation is not visualized. No aortic stenosis is present. Aortic valve mean gradient measures 16.0 mmHg. Aortic valve peak gradient measures 25.2  mmHg. Aortic valve area, by VTI measures 1.25 cm. There is a 23 mm Magna porcine valve present in the aortic position. Procedure Date: 05/2010. Echo findings are consistent with normal structure and function of the aortic valve prosthesis. Pulmonic Valve: The pulmonic valve was normal in structure. Pulmonic valve regurgitation is not visualized. No evidence of pulmonic stenosis. Aorta: The aortic root is normal in size and structure. Venous: The inferior vena cava is normal in size with greater than 50% respiratory variability, suggesting right atrial pressure of 3 mmHg. IAS/Shunts: No atrial level shunt detected by color flow Doppler.  LEFT VENTRICLE PLAX 2D LVIDd:         4.00 cm   Diastology LVIDs:         2.90 cm   LV e' medial:    7.62 cm/s LV PW:         1.40 cm   LV E/e' medial:  13.9 LV IVS:  1.60 cm   LV e' lateral:   9.68 cm/s LVOT diam:     2.20 cm   LV E/e' lateral: 11.0 LV SV:         63 LV SV Index:   33 LVOT Area:     3.80 cm  RIGHT VENTRICLE RV S prime:     8.70 cm/s TAPSE (M-mode): 1.6 cm LEFT ATRIUM              Index        RIGHT ATRIUM           Index LA diam:        4.10 cm  2.18 cm/m   RA Area:     26.50 cm LA Vol (A2C):   119.0 ml 63.27 ml/m  RA Volume:   89.40 ml  47.53 ml/m LA Vol (A4C):   92.3 ml  49.07 ml/m LA Biplane Vol: 105.0 ml 55.83 ml/m  AORTIC VALVE AV Area (Vmax):    1.33 cm AV Area (Vmean):   1.20 cm AV Area (VTI):     1.25 cm AV Vmax:           251.00 cm/s AV Vmean:          188.000 cm/s AV VTI:            0.503 m AV Peak Grad:      25.2 mmHg AV Mean Grad:      16.0 mmHg LVOT Vmax:         87.50 cm/s LVOT Vmean:        59.300 cm/s LVOT VTI:          0.165 m LVOT/AV VTI ratio: 0.33  AORTA Ao Root diam: 3.60 cm Ao Asc diam:  3.20 cm MITRAL VALVE                TRICUSPID VALVE MV Area (PHT): 4.06 cm     TR Peak grad:   38.4 mmHg MV Area VTI:   2.43 cm     TR Vmax:        310.00 cm/s MV Peak grad:  7.2 mmHg MV Mean grad:  3.0 mmHg     SHUNTS MV Vmax:       1.34 m/s      Systemic VTI:  0.16 m MV Vmean:      79.6 cm/s    Systemic Diam: 2.20 cm MV Decel Time: 187 msec MV E velocity: 106.00 cm/s Candee Furbish MD Electronically signed by Candee Furbish MD Signature Date/Time: 09/04/2022/11:53:44 AM    Final    CT MAXILLOFACIAL W CONTRAST  Result Date: 09/03/2022 CLINICAL DATA:  Concern for sublingual/submandibular abscess EXAM: CT MAXILLOFACIAL WITH CONTRAST TECHNIQUE: Multidetector CT imaging of the maxillofacial structures was performed with intravenous contrast. Multiplanar CT image reconstructions were also generated. RADIATION DOSE REDUCTION: This exam was performed according to the departmental dose-optimization program which includes automated exposure control, adjustment of the mA and/or kV according to patient size and/or use of iterative reconstruction technique. CONTRAST:  145m OMNIPAQUE IOHEXOL 300 MG/ML  SOLN COMPARISON:  None Available. FINDINGS: Evaluation is somewhat limited by beam hardening artifact from extensive dental hardware. Osseous: No fracture or mandibular dislocation. No destructive process. Evaluation for periapical lucencies is limited by aforementioned beam hardening artifact. There is extension of the root of the left canine dental implant through the anterior aspect of the maxilla (series 8, image 58 and series 4, image 43), of unknown clinical significance. Orbits: Negative. No traumatic  or inflammatory finding. Status post bilateral lens replacements. Sinuses: Near complete opacification of the bilateral maxillary sinuses, with additional opacification of the right greater than left ethmoid air cells and frontal sinuses. The mastoids are well aerated. Soft tissues: Normal appearance of the parotid and submandibular glands. No evidence of soft tissue collection. Limited intracranial: No significant or unexpected finding. IMPRESSION: 1. Evaluation is somewhat limited by beam hardening artifact from extensive dental hardware. 2. 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 3. No evidence of soft tissue collection. No evidence of sublingual or submandibular abscess 4. Near complete opacification of the bilateral maxillary sinuses, with additional opacification of the right greater than left ethmoid air cells and frontal sinuses. Correlate for sinusitis. Electronically Signed   By: Merilyn Baba M.D.   On: 09/03/2022 19:12       Alma Friendly M.D. [facility] 09/05/2022, 6:39 PM  Available via Epic secure chat 7am-7pm After 7 pm, please refer to night coverage provider listed on amion.

## 2022-09-05 NOTE — Telephone Encounter (Signed)
Patient cancelled Myoview x 2 for reason below:  09/05/22 09/05/2022 9:40 AM HC:[id number] [name], Jason  Cancel Rsn: Patient- He will call back to schedule   Order will be removed from the active nuc wq and if patient calls back we will reinstate the order. Thank you.

## 2022-09-06 ENCOUNTER — Encounter (HOSPITAL_COMMUNITY): Payer: Medicare Other

## 2022-09-06 DIAGNOSIS — K31811 Angiodysplasia of stomach and duodenum with bleeding: Secondary | ICD-10-CM | POA: Diagnosis not present

## 2022-09-06 DIAGNOSIS — K31819 Angiodysplasia of stomach and duodenum without bleeding: Secondary | ICD-10-CM | POA: Diagnosis not present

## 2022-09-06 DIAGNOSIS — A491 Streptococcal infection, unspecified site: Secondary | ICD-10-CM | POA: Diagnosis not present

## 2022-09-06 LAB — BASIC METABOLIC PANEL
Anion gap: 6 (ref 5–15)
BUN: 9 mg/dL (ref 8–23)
CO2: 27 mmol/L (ref 22–32)
Calcium: 7.9 mg/dL — ABNORMAL LOW (ref 8.9–10.3)
Chloride: 99 mmol/L (ref 98–111)
Creatinine, Ser: 0.77 mg/dL (ref 0.61–1.24)
GFR, Estimated: >60 mL/min (ref >60)
Glucose, Bld: 93 mg/dL (ref 70–99)
Potassium: 3.2 mmol/L — ABNORMAL LOW (ref 3.5–5.1)
Sodium: 132 mmol/L — ABNORMAL LOW (ref 135–145)

## 2022-09-06 LAB — HEPATIC FUNCTION PANEL
ALT: 12 U/L (ref 0–44)
AST: 18 U/L (ref 15–41)
Albumin: 2.3 g/dL — ABNORMAL LOW (ref 3.5–5.0)
Alkaline Phosphatase: 69 U/L (ref 38–126)
Bilirubin, Direct: <0.1 mg/dL (ref 0.0–0.2)
Total Bilirubin: 0.2 mg/dL — ABNORMAL LOW (ref 0.3–1.2)
Total Protein: 5.9 g/dL — ABNORMAL LOW (ref 6.5–8.1)

## 2022-09-06 LAB — CBC WITH DIFFERENTIAL/PLATELET
Abs Immature Granulocytes: 0.05 10*3/uL (ref 0.00–0.07)
Basophils Absolute: 0 10*3/uL (ref 0.0–0.1)
Basophils Relative: 1 %
Eosinophils Absolute: 0.1 10*3/uL (ref 0.0–0.5)
Eosinophils Relative: 1 %
HCT: 24.2 % — ABNORMAL LOW (ref 39.0–52.0)
Hemoglobin: 7.4 g/dL — ABNORMAL LOW (ref 13.0–17.0)
Immature Granulocytes: 1 %
Lymphocytes Relative: 12 %
Lymphs Abs: 1 10*3/uL (ref 0.7–4.0)
MCH: 27.3 pg (ref 26.0–34.0)
MCHC: 30.6 g/dL (ref 30.0–36.0)
MCV: 89.3 fL (ref 80.0–100.0)
Monocytes Absolute: 0.9 10*3/uL (ref 0.1–1.0)
Monocytes Relative: 12 %
Neutro Abs: 5.8 10*3/uL (ref 1.7–7.7)
Neutrophils Relative %: 73 %
Platelets: 183 10*3/uL (ref 150–400)
RBC: 2.71 MIL/uL — ABNORMAL LOW (ref 4.22–5.81)
RDW: 21.3 % — ABNORMAL HIGH (ref 11.5–15.5)
WBC: 7.8 10*3/uL (ref 4.0–10.5)
nRBC: 0 % (ref 0.0–0.2)

## 2022-09-06 NOTE — Plan of Care (Signed)
  Problem: Clinical Measurements: Goal: Ability to maintain clinical measurements within normal limits will improve Outcome: Progressing Goal: Diagnostic test results will improve Outcome: Progressing Goal: Respiratory complications will improve Outcome: Progressing   

## 2022-09-06 NOTE — Progress Notes (Addendum)
[facility] Infectious Diseases Follow Up Note  Patient Identification: Patient Name: Jason [name] MRN: [id number] [place] Date: 08/29/2022  1:07 PM Age: 89 y.o.Today's Date: 09/06/2022   Reason for Visit: bacteremia   Principal Problem:   Streptococcus bovis infection Active Problems:   OSA (obstructive sleep apnea)   Atrial fibrillation, chronic (HCC)   Gastric AVM   Anemia   Anemia associated with acute blood loss   GI bleed   Hx of CABG   Hypotension   Heme positive stool   AVM (arteriovenous malformation) of small bowel, acquired   History of iron deficiency   S/P AVR   Prosthetic valve endocarditis (HCC)   Diskitis   Vertebral osteomyelitis (HCC)   Malnutrition of moderate degree   Antibiotics:  Ceftriaxone 1/6-c  Lines/Hardwares: Rt ureteral stent  Interval Events:  Assessment 71 Y O male with PMH as below including AV stenosis s/p AVR , h/o colon ca s/p rt hemicolectomy who initially presented with worsening fatigue,  generalized weakness, SOB  for several weeks/few months with poor po appetite. At ED hypotensive, FOBT + with anemia 2/2 GIB. ENDOSCOPY 08/31/22 showed multiple AVMs in the stomach and 1 duodenal AVM which were ablated. Noted ulceration in the fundus of the stomach which was biopsied ( negative for malignancy and H pylori).  ID engaged for   # Strep Infantarius bacteremia  - GI following already, will need colonoscopy at some point to r/o recurrence of colonic malignancy   # L1-L2 discitis/osteomyelitis as well as arachnoiditis   Recommendations Continue ceftriaxone 2 g iv q12 hrs for at least 14 days given arachnoiditis before switching to ceftriaxone 2g iv daily thereafter Fu repeat blood cx for clearance  TEE is pending currently, was told will be scheduled next Monday Will need prolonged of IV abtx , at least 6 weeks  Monitor CBC and BMP  Call us back once TEE done/any questions   Rest  of the management as per the primary team. Thank you for the consult. Please page with pertinent questions or concerns.  ______________________________________________________________________ Subjective patient seen and examined at the bedside. Some lower back pain. Denies nausea, vomiting or diarrhea  Discussed with his wife at bedside.   Past Medical History:  Diagnosis Date   Abnormal PSA    Adenomatous polyp of colon 2010 and before 2006   Anemia    Anxiety    Aortic stenosis    Aortic valve prosthesis present    Atrial fibrillation (HCC)    CAD (coronary artery disease)    DDD (degenerative disc disease), lumbar    Depression    Diverticulosis    Dyslipidemia    GERD (gastroesophageal reflux disease)    Glaucoma    History of blood transfusion 09/29/2016   History of kidney stones    Hx of CABG    Hypogonadism, male    Macular degeneration    OSA (obstructive sleep apnea)    Osteoarthritis    Osteopenia    Prostate CA ([place]) prostate 2007   colon dx 2018, watching psa levels, no tx yet for prostate   Past Surgical History:  Procedure Laterality Date   AORTIC VALVE REPLACEMENT  October 2011   Magna Ease pericardial tissue valve #36m   BACK SURGERY  1978   lower   BIOPSY  08/31/2022   Procedure: BIOPSY;  Surgeon: MIrving Copas, MD;  [facility];  Service: Gastroenterology;;   CATARACT EXTRACTION Bilateral    CORNEAL TRANSPLANT Right  CORONARY ARTERY BYPASS GRAFT  October 2011   LIMA to LAD   ENTEROSCOPY N/A 08/31/2022   Procedure: ENTEROSCOPY;  Surgeon: Mansouraty, Telford Nab., MD;  [facility];  Service: Gastroenterology;  Laterality: N/A;   ESOPHAGOGASTRODUODENOSCOPY N/A 09/16/2014   Procedure: ESOPHAGOGASTRODUODENOSCOPY (EGD);  Surgeon: [name], MD;  Location: [facility] END[facility]COPY;  Service: Endoscopy;  Laterality: N/A;   HEMOSTASIS CLIP PLACEMENT  08/31/2022   Procedure: HEMOSTASIS CLIP PLACEMENT;  Surgeon: Irving Copas.,  MD;  Location: [facility];  Service: Gastroenterology;;   HOT HEMOSTASIS N/A 08/31/2022   Procedure: HOT HEMOSTASIS (ARGON PLASMA COAGULATION/BICAP);  Surgeon: Irving Copas., MD;  [facility];  Service: Gastroenterology;  Laterality: N/A;   SUBMUCOSAL TATTOO INJECTION  08/31/2022   Procedure: SUBMUCOSAL TATTOO INJECTION;  Surgeon: Rush Landmark Telford Nab., MD;  Location: [facility];  Service: Gastroenterology;;   TONSILLECTOMY  76 yrs ago   TRANSURETHRAL RESECTION OF BLADDER TUMOR Right 01/04/2022   Procedure: TRANSURETHRAL RESECTION OF BLADDER TUMOR (TURBT) WITH POST OPERATIVE INSTILLATION OF GEMCITABINE/ POSSIBLE RIGHT STENT PLACEMENT;  Surgeon: Ceasar Mons, MD;  Location: [facility];  Service: Urology;  Laterality: Right;    Vitals BP (!) 111/54 (BP Location: Right Arm)   Pulse 94   Temp (!) 97.3 F (36.3 C) (Oral)   Resp 20   Ht '5\' 10"'$  (1.778 m)   Wt 71 kg   SpO2 94%   BMI 22.46 kg/m      Physical Exam Constitutional:  elderly white male sitting up in the bed and legally blind     Comments:   Cardiovascular:     Rate and Rhythm: Normal rate and Irregular rhythm.     Heart sounds: murmur +  Pulmonary:     Effort: Pulmonary effort is normal on room air    Comments: Normal breath sounds  Abdominal:     Palpations: Abdomen is soft.     Tenderness: non distended and non tender   Musculoskeletal:        General: No swelling or tenderness in peripheral joints   Skin:    Comments: no obvious rashes   Neurological:     General: awake, alert and oriented, grossly non focal   Psychiatric:        Mood and Affect: Mood normal.   Pertinent Microbiology Results for orders placed or performed during the hospital encounter of 08/29/22  MRSA Next Gen by PCR, Nasal     Status: None   Collection Time: 08/29/22  8:52 PM   Specimen: Nasal Mucosa; Nasal Swab  Result Value Ref Range Status   MRSA by PCR Next Gen NOT DETECTED NOT DETECTED Final     Comment: (NOTE) The GeneXpert MRSA Assay (FDA approved for NASAL specimens only), is one component of a comprehensive MRSA colonization surveillance program. It is not intended to diagnose MRSA infection nor to guide or monitor treatment for MRSA infections. Test performance is not FDA approved in patients less than 29 years old. Performed at [facility], [place] [address], [place], [place] [address]   Culture, blood (Routine X 2) w Reflex to ID Panel     Status: Abnormal   Collection Time: 09/01/22  6:39 PM   Specimen: BLOOD RIGHT ARM  Result Value Ref Range Status   Specimen Description   Final    BLOOD RIGHT ARM Performed at [facility], [place] [address] [place], [place] [address]    Special Requests   Final    BOTTLES  DRAWN AEROBIC ONLY Blood Culture adequate volume Performed at [place] [address] [place], [place] [address]    Culture  Setup Time   Final    AEROBIC BOTTLE ONLY GRAM POSITIVE COCCI IN CHAINS Organism ID to follow CRITICAL RESULT CALLED TO, READ BACK BY AND VERIFIED WITH:  C/ PHARMD J. LEGGE 09/02/22 1337 A. LAFRANCE Performed at [facility], [place] [address], [place], [place] [address]    Culture STREPTOCOCCUS INFANTARIUS (A)  Final   Report Status 09/04/2022 FINAL  Final   Organism ID, Bacteria STREPTOCOCCUS INFANTARIUS  Final      Susceptibility   Streptococcus infantarius - MIC*    ERYTHROMYCIN <=0.12 SENSITIVE Sensitive     TETRACYCLINE 0.5 SENSITIVE Sensitive     VANCOMYCIN <=0.12 SENSITIVE Sensitive     CLINDAMYCIN Value in next row Sensitive      SENSITIVE0.25    PENICILLIN Value in next row Intermediate      INTERMEDIATE0.25    CEFTRIAXONE Value in next row Sensitive      SENSITIVE0.25    * STREPTOCOCCUS INFANTARIUS  Culture, blood (Routine X 2) w Reflex to ID Panel     Status: Abnormal   Collection Time: 09/01/22  6:39 PM   Specimen: BLOOD LEFT HAND  Result Value Ref Range Status    Specimen Description   Final    BLOOD LEFT HAND Performed at [facility], [place] [address], [place], [place] [address]    Special Requests   Final    BOTTLES DRAWN AEROBIC ONLY Blood Culture adequate volume Performed at [place] [address] [place], [place] [address]    Culture  Setup Time   Final    AEROBIC BOTTLE ONLY GRAM POSITIVE COCCI IN CHAINS CRITICAL VALUE NOTED.  VALUE IS CONSISTENT WITH PREVIOUSLY REPORTED AND CALLED VALUE.    Culture (A)  Final    STREPTOCOCCUS INFANTARIUS SUSCEPTIBILITIES PERFORMED ON PREVIOUS CULTURE WITHIN THE LAST 5 DAYS. Performed at [facility], [place] [address] [place], [place] [address]    Report Status 09/04/2022 FINAL  Final  Blood Culture ID Panel (Reflexed)     Status: Abnormal   Collection Time: 09/01/22  6:39 PM  Result Value Ref Range Status   Enterococcus faecalis NOT DETECTED NOT DETECTED Final   Enterococcus Faecium NOT DETECTED NOT DETECTED Final   Listeria monocytogenes NOT DETECTED NOT DETECTED Final   Staphylococcus species NOT DETECTED NOT DETECTED Final   Staphylococcus aureus (BCID) NOT DETECTED NOT DETECTED Final   Staphylococcus epidermidis NOT DETECTED NOT DETECTED Final   Staphylococcus lugdunensis NOT DETECTED NOT DETECTED Final   Streptococcus species DETECTED (A) NOT DETECTED Final    Comment: Not Enterococcus species, Streptococcus agalactiae, Streptococcus pyogenes, or Streptococcus pneumoniae. CRITICAL RESULT CALLED TO, READ BACK BY AND VERIFIED WITH:  C/ PHARMD J. LEGGE 09/02/22 1337 A. LAFRANCE    Streptococcus agalactiae NOT DETECTED NOT DETECTED Final   Streptococcus pneumoniae NOT DETECTED NOT DETECTED Final   Streptococcus pyogenes NOT DETECTED NOT DETECTED Final   A.calcoaceticus-baumannii NOT DETECTED NOT DETECTED Final   Bacteroides fragilis NOT DETECTED NOT DETECTED Final   Enterobacterales NOT DETECTED NOT DETECTED Final   Enterobacter cloacae complex NOT DETECTED  NOT DETECTED Final   Escherichia coli NOT DETECTED NOT DETECTED Final   Klebsiella aerogenes NOT DETECTED NOT DETECTED Final   Klebsiella oxytoca NOT DETECTED NOT DETECTED Final   Klebsiella pneumoniae NOT DETECTED NOT DETECTED Final   Proteus species  NOT DETECTED NOT DETECTED Final   Salmonella species NOT DETECTED NOT DETECTED Final   Serratia marcescens NOT DETECTED NOT DETECTED Final   Haemophilus influenzae NOT DETECTED NOT DETECTED Final   Neisseria meningitidis NOT DETECTED NOT DETECTED Final   Pseudomonas aeruginosa NOT DETECTED NOT DETECTED Final   Stenotrophomonas maltophilia NOT DETECTED NOT DETECTED Final   Candida albicans NOT DETECTED NOT DETECTED Final   Candida auris NOT DETECTED NOT DETECTED Final   Candida glabrata NOT DETECTED NOT DETECTED Final   Candida krusei NOT DETECTED NOT DETECTED Final   Candida parapsilosis NOT DETECTED NOT DETECTED Final   Candida tropicalis NOT DETECTED NOT DETECTED Final   Cryptococcus neoformans/gattii NOT DETECTED NOT DETECTED Final    Comment: Performed at [facility], [place] [address] [place], [place] [address]  Culture, blood (Routine X 2) w Reflex to ID Panel     Status: None (Preliminary result)   Collection Time: 09/03/22 12:02 PM   Specimen: BLOOD RIGHT ARM  Result Value Ref Range Status   Specimen Description   Final    BLOOD RIGHT ARM Performed at [facility], [place] [address], [place], [place] [address]    Special Requests   Final    BOTTLES DRAWN AEROBIC ONLY Blood Culture adequate volume Performed at [facility] [address], [place], [place] [address]    Culture   Final    NO GROWTH 2 DAYS Performed at [place] [address], [place], [place] [address]    Report Status PENDING  Incomplete  Culture, blood (Routine X 2) w Reflex to ID Panel     Status: None (Preliminary result)   Collection Time: 09/03/22 12:02 PM   Specimen: BLOOD LEFT HAND  Result Value Ref Range Status    Specimen Description   Final    BLOOD LEFT HAND Performed at [facility], [place] [address] [place], [place] [address]    Special Requests   Final    BOTTLES DRAWN AEROBIC ONLY Blood Culture adequate volume Performed at [place] [address], [place], [place] [address]    Culture   Final    NO GROWTH 2 DAYS Performed at [employer] [address], [place], [place] [address]    Report Status PENDING  Incomplete    Pathology 08/31/22 FINAL MICROSCOPIC DIAGNOSIS:   A. STOMACH, ULCER, BIOPSY:  Gastric mucosa with acute inflammation and necrosis consistent with  ulcer.  Negative for dysplasia or malignancy.   B. STOMACH, RANDOM, BIOPSY:  Antral and oxyntic mucosa with hyperemia.  No Helicobacter pylori identified.   Pertinent Lab.    Latest Ref Rng & Units 09/06/2022    5:36 AM 09/05/2022    5:14 AM 09/04/2022    5:44 AM  CBC  WBC 4.0 - 10.5 K/uL 7.8  9.1  8.4   Hemoglobin 13.0 - 17.0 g/dL 7.4  7.8  7.7   Hematocrit 39.0 - 52.0 % 24.2  26.3  25.5   Platelets 150 - 400 K/uL 183  194  186       Latest Ref Rng & Units 09/05/2022    5:14 AM 09/04/2022    5:44 AM 09/03/2022    5:40 AM  CMP  Glucose 70 - 99 mg/dL 97  106  121   BUN 8 - 23 mg/dL '11  14  16   '$ Creatinine 0.61 - 1.24 mg/dL 0.78  0.88  0.84   Sodium  135 - 145 mmol/L 132  132  132   Potassium 3.5 - 5.1 mmol/L 3.6  4.0  3.9   Chloride 98 - 111 mmol/L 100  99  102   CO2 22 - 32 mmol/L '27  26  25   '$ Calcium 8.9 - 10.3 mg/dL 7.9  7.9  8.0      Pertinent Imaging today Plain films and CT images have been personally visualized and interpreted; radiology reports have been reviewed. Decision making incorporated into the Impression / Recommendations.  ECHOCARDIOGRAM COMPLETE  Result Date: 09/04/2022    ECHOCARDIOGRAM REPORT   Patient Name:   BERYLE ZEITZ [facility] Date of Exam: 09/04/2022 Medical Rec #:  [id number]         Height:       70.0 in Accession #:    [id number]        Weight:       156.5  lb Date of Birth:  June 06, 1926         BSA:          1.881 m Patient Age:    38 years          BP:           97/62 mmHg Patient Gender: M                 HR:           85 bpm. Exam Location:  Inpatient Procedure: 2D Echo, Cardiac Doppler and Color Doppler Indications:    Bacteremia  History:        Patient has prior history of Echocardiogram examinations. CAD,                 Prior CABG, Aortic Valve Disease, Arrythmias:Atrial                 Fibrillation; Risk Factors:Dyslipidemia.                 Aortic Valve: 23 mm Magna porcine valve is present in the aortic                 position. Procedure Date: 05/2010.  Sonographer:    Meagan Baucom RDCS, FE, PE Referring Phys: [place]  1. Left ventricular ejection fraction, by estimation, is 60 to 65%. The left ventricle has normal function. The left ventricle has no regional wall motion abnormalities. There is moderate left ventricular hypertrophy. Left ventricular diastolic parameters are indeterminate.  2. Right ventricular systolic function is normal. The right ventricular size is normal.  3. Left atrial size was moderately dilated.  4. Right atrial size was moderately dilated.  5. The mitral valve has been repaired/replaced. Mild to moderate mitral valve regurgitation. No evidence of mitral stenosis. Severe mitral annular calcification.  6. The aortic valve has been repaired/replaced. Aortic valve regurgitation is not visualized. No aortic stenosis is present. There is a 23 mm Magna porcine valve present in the aortic position. Procedure Date: 05/2010. Echo findings are consistent with normal structure and function of the aortic valve prosthesis. Aortic valve area, by VTI measures 1.25 cm. Aortic valve mean gradient measures 16.0 mmHg. Aortic valve Vmax measures 2.51 m/s.  7. The inferior vena cava is normal in size with greater than 50% respiratory variability, suggesting right atrial pressure of 3 mmHg. Comparison(s): No significant  change from prior study. Prior images reviewed side by side. Conclusion(s)/Recommendation(s): No evidence of valvular vegetations on this transthoracic echocardiogram. Consider a transesophageal echocardiogram to  exclude infective endocarditis if clinically indicated. FINDINGS  Left Ventricle: Left ventricular ejection fraction, by estimation, is 60 to 65%. The left ventricle has normal function. The left ventricle has no regional wall motion abnormalities. The left ventricular internal cavity size was normal in size. There is  moderate left ventricular hypertrophy. Left ventricular diastolic parameters are indeterminate. Right Ventricle: The right ventricular size is normal. No increase in right ventricular wall thickness. Right ventricular systolic function is normal. Left Atrium: Left atrial size was moderately dilated. Right Atrium: Right atrial size was moderately dilated. Pericardium: There is no evidence of pericardial effusion. Mitral Valve: The mitral valve has been repaired/replaced. There is mild thickening of the mitral valve leaflet(s). Severe mitral annular calcification. Mild to moderate mitral valve regurgitation. No evidence of mitral valve stenosis. MV peak gradient, 7.2 mmHg. The mean mitral valve gradient is 3.0 mmHg. Tricuspid Valve: The tricuspid valve is normal in structure. Tricuspid valve regurgitation is mild . No evidence of tricuspid stenosis. Aortic Valve: The aortic valve has been repaired/replaced. Aortic valve regurgitation is not visualized. No aortic stenosis is present. Aortic valve mean gradient measures 16.0 mmHg. Aortic valve peak gradient measures 25.2 mmHg. Aortic valve area, by VTI measures 1.25 cm. There is a 23 mm Magna porcine valve present in the aortic position. Procedure Date: 05/2010. Echo findings are consistent with normal structure and function of the aortic valve prosthesis. Pulmonic Valve: The pulmonic valve was normal in structure. Pulmonic valve regurgitation  is not visualized. No evidence of pulmonic stenosis. Aorta: The aortic root is normal in size and structure. Venous: The inferior vena cava is normal in size with greater than 50% respiratory variability, suggesting right atrial pressure of 3 mmHg. IAS/Shunts: No atrial level shunt detected by color flow Doppler.  LEFT VENTRICLE PLAX 2D LVIDd:         4.00 cm   Diastology LVIDs:         2.90 cm   LV e' medial:    7.62 cm/s LV PW:         1.40 cm   LV E/e' medial:  13.9 LV IVS:        1.60 cm   LV e' lateral:   9.68 cm/s LVOT diam:     2.20 cm   LV E/e' lateral: 11.0 LV SV:         63 LV SV Index:   33 LVOT Area:     3.80 cm  RIGHT VENTRICLE RV S prime:     8.70 cm/s TAPSE (M-mode): 1.6 cm LEFT ATRIUM              Index        RIGHT ATRIUM           Index LA diam:        4.10 cm  2.18 cm/m   RA Area:     26.50 cm LA Vol (A2C):   119.0 ml 63.27 ml/m  RA Volume:   89.40 ml  47.53 ml/m LA Vol (A4C):   92.3 ml  49.07 ml/m LA Biplane Vol: 105.0 ml 55.83 ml/m  AORTIC VALVE AV Area (Vmax):    1.33 cm AV Area (Vmean):   1.20 cm AV Area (VTI):     1.25 cm AV Vmax:           251.00 cm/s AV Vmean:          188.000 cm/s AV VTI:  0.503 m AV Peak Grad:      25.2 mmHg AV Mean Grad:      16.0 mmHg LVOT Vmax:         87.50 cm/s LVOT Vmean:        59.300 cm/s LVOT VTI:          0.165 m LVOT/AV VTI ratio: 0.33  AORTA Ao Root diam: 3.60 cm Ao Asc diam:  3.20 cm MITRAL VALVE                TRICUSPID VALVE MV Area (PHT): 4.06 cm     TR Peak grad:   38.4 mmHg MV Area VTI:   2.43 cm     TR Vmax:        310.00 cm/s MV Peak grad:  7.2 mmHg MV Mean grad:  3.0 mmHg     SHUNTS MV Vmax:       1.34 m/s     Systemic VTI:  0.16 m MV Vmean:      79.6 cm/s    Systemic Diam: 2.20 cm MV Decel Time: 187 msec MV E velocity: 106.00 cm/s Candee Furbish MD Electronically signed by Candee Furbish MD Signature Date/Time: 09/04/2022/11:53:44 AM    Final    CT MAXILLOFACIAL W CONTRAST  Result Date: 09/03/2022 CLINICAL DATA:  Concern for  sublingual/submandibular abscess EXAM: CT MAXILLOFACIAL WITH CONTRAST TECHNIQUE: Multidetector CT imaging of the maxillofacial structures was performed with intravenous contrast. Multiplanar CT image reconstructions were also generated. RADIATION DOSE REDUCTION: This exam was performed according to the departmental dose-optimization program which includes automated exposure control, adjustment of the mA and/or kV according to patient size and/or use of iterative reconstruction technique. CONTRAST:  184m OMNIPAQUE IOHEXOL 300 MG/ML  SOLN COMPARISON:  None Available. FINDINGS: Evaluation is somewhat limited by beam hardening artifact from extensive dental hardware. Osseous: No fracture or mandibular dislocation. No destructive process. Evaluation for periapical lucencies is limited by aforementioned beam hardening artifact. There is extension of the root of the left canine dental implant through the anterior aspect of the maxilla (series 8, image 58 and series 4, image 43), of unknown clinical significance. Orbits: Negative. No traumatic or inflammatory finding. Status post bilateral lens replacements. Sinuses: Near complete opacification of the bilateral maxillary sinuses, with additional opacification of the right greater than left ethmoid air cells and frontal sinuses. The mastoids are well aerated. Soft tissues: Normal appearance of the parotid and submandibular glands. No evidence of soft tissue collection. Limited intracranial: No significant or unexpected finding. IMPRESSION: 1. Evaluation is somewhat limited by beam hardening artifact from extensive dental hardware. 2. Within the above limitation, extension of the root of the left canine dental implant through the anterior aspect of the maxilla, of unknown clinical significance. 3. No evidence of soft tissue collection. No evidence of sublingual or submandibular abscess 4. Near complete opacification of the bilateral maxillary sinuses, with additional  opacification of the right greater than left ethmoid air cells and frontal sinuses. Correlate for sinusitis. Electronically Signed   By: AMerilyn BabaM.D.   On: 09/03/2022 19:12   MR LUMBAR SPINE WO CONTRAST  Result Date: 09/02/2022 CLINICAL DATA:  Low back pain, bacteremic EXAM: MRI LUMBAR SPINE WITHOUT CONTRAST TECHNIQUE: Multiplanar, multisequence MR imaging of the lumbar spine was performed. No intravenous contrast was administered. COMPARISON:  08/07/2022 MRI lumbar spine, correlation is also made with 08/30/2022 CT abdomen pelvis and 10/04/2021 CT chest abdomen pelvis FINDINGS: Segmentation: 5 lumbar type vertebral bodies. Congenital ankylosis of T12 and  L1. Alignment: Trace retrolisthesis of L1 on L2, L2 on L3, L3 on L4, and L5 on S1. Trace anterolisthesis of L4 on L5. Vertebrae: Redemonstrated decreased T1 signal and increased T2 signal in the majority of L1 and L2, similar to the prior exam. Increased T2 signal at the L1-L2 disc space. The endplate irregularity and erosion appears grossly unchanged compared to a prior MRI, and the 08/30/2022 CT appears unchanged compared to the 10/04/2021 CT. No acute fracture or suspicious osseous lesion. Increased T2 signal is also noted in the disc spaces at T11-T12, L2-L3, L3-L4 and L5-S1, without associated marrow abnormality. The Conus medullaris and cauda equina: Conus extends to the L1 level. The conus and cauda equina appear to closely approximate the dorsal aspect of the T12 and L1 vertebral body, and do not layer dependently, possibly adherent to theca (series 5, image 7. More inferiorly, there is clumping of the nerve roots (series 5, image 18). Both findings are concerning for arachnoiditis. No evidence of epidural collection. Paraspinal and other soft tissues: Edema at the medial aspect of the bilateral psoas muscles (series 5, image 17), most prominently at L2. No focal collection within the musculature. Increased T2 signal anterior to T12-L2, possibly  inflammatory changes, without a focal T1 hypointense collection to suggest phlegmon. Disc levels: T12-L1: No significant disc bulge. No spinal canal stenosis or neural foraminal narrowing. L1-L2: Trace retrolisthesis and minimal disc bulge. No spinal canal stenosis or neural foraminal narrowing. L2-L3: Trace retrolisthesis with disc osteophyte complex. Mild facet arthropathy. Narrowing of the lateral recesses. No spinal canal stenosis or neural foraminal narrowing. L3-L4: Trace retrolisthesis. Mild facet arthropathy. No spinal canal stenosis or neural foraminal narrowing. L4-L5: Trace anterolisthesis. Moderate facet arthropathy. Ligamentum flavum hypertrophy. Narrowing of the lateral recesses. Mild spinal canal stenosis. Mild right neural foraminal narrowing. L5-S1: Mild disc bulge. Moderate right and mild left facet arthropathy. Prior laminectomy. No spinal canal stenosis. Severe right and moderate left neural foraminal narrowing. IMPRESSION: 1. Fluid signal throughout the L1-L2 disc space, which appears increased compared to 08/07/2022, with similar T2 hyperintense signal and erosive changes in the adjacent vertebral bodies, concerning for L1-L2 discitis/osteomyelitis. No evidence of epidural collection. 2. Increased T2 signal in the disc spaces at T11-T12, L2-L3, L3-L4, and L5-S1, without associated marrow abnormality, which is favored to be degenerative in nature. Attention on follow-up 3. Edema at the medial aspect of the bilateral psoas muscles, most prominently at L2, without focal collection, likely reactive. 4. The conus and cauda equina are adherent to the thecal sac in the upper lumbar spine and appear clumped in the lower lumbar spine, concerning for arachnoiditis. These results will be called to the ordering clinician or representative by the Radiologist Assistant, and communication documented in the PACS or [employer]. Electronically Signed   By: Merilyn Baba M.D.   On: 09/02/2022 23:13   CT  ABDOMEN PELVIS W CONTRAST  Result Date: 08/30/2022 CLINICAL DATA:  Metastatic disease evaluation, history of prostate cancer * Tracking Code: BO * EXAM: CT ABDOMEN AND PELVIS WITH CONTRAST TECHNIQUE: Multidetector CT imaging of the abdomen and pelvis was performed using the standard protocol following bolus administration of intravenous contrast. RADIATION DOSE REDUCTION: This exam was performed according to the departmental dose-optimization program which includes automated exposure control, adjustment of the mA and/or kV according to patient size and/or use of iterative reconstruction technique. CONTRAST:  171m OMNIPAQUE IOHEXOL 300 MG/ML  SOLN COMPARISON:  CT chest abdomen pelvis, 10/04/2021 FINDINGS: Lower chest: Cardiomegaly and coronary artery calcifications.  Small bilateral pleural effusions. Hepatobiliary: No solid liver abnormality is seen. No gallstones, gallbladder wall thickening, or biliary dilatation. Pancreas: Unremarkable. No pancreatic ductal dilatation or surrounding inflammatory changes. Spleen: Normal in size without significant abnormality. Adrenals/Urinary Tract: Adrenal glands are unremarkable. Kidneys are normal, without renal calculi, solid lesion, or hydronephrosis. Bladder is unremarkable. Stomach/Bowel: Stomach is within normal limits. Status post right hemicolectomy and reanastomosis. No evidence of bowel wall thickening, distention, or inflammatory changes. Descending and sigmoid diverticulosis. Vascular/Lymphatic: Aortic atherosclerosis. No enlarged abdominal or pelvic lymph nodes. Reproductive: Prostatomegaly. Other: No abdominal wall hernia or abnormality. Unchanged focus of nonacute fat necrosis in the right lower quadrant (series 2, image 60). No ascites. Musculoskeletal: No acute osseous findings. Unchanged severe, sclerotic disc degenerative disease and endplate destruction of L1-L2 (series 6, image 66). IMPRESSION: 1. No evidence of lymphadenopathy or metastatic disease in the  abdomen or pelvis. 2. A previously described right ureterovesicular junction soft tissue nodule is no longer seen, possibly resected. 3. Status post right hemicolectomy and reanastomosis. 4. Descending and sigmoid diverticulosis without evidence of acute diverticulitis. 5. Prostatomegaly without discretely visualized mass. 6. Cardiomegaly and coronary artery disease. 7. Small bilateral pleural effusions. Aortic Atherosclerosis (ICD10-I70.0). Electronically Signed   By: Delanna Ahmadi M.D.   On: 08/30/2022 13:29   DG Chest 2 View  Result Date: 08/29/2022 CLINICAL DATA:  Shortness of breath EXAM: CHEST - 2 VIEW COMPARISON:  07/15/2010 chest radiograph and 10/04/2021 CT FINDINGS: Cardiomegaly and aortic valve replacement again noted. Elevation of LEFT hemidiaphragm and minimal LEFT basilar scarring/atelectasis again noted. There is no evidence of focal airspace disease, pulmonary edema, suspicious pulmonary nodule/mass, pleural effusion, or pneumothorax. No acute bony abnormalities are identified. IMPRESSION: Cardiomegaly without evidence of acute cardiopulmonary disease. Electronically Signed   By: Margarette Canada M.D.   On: 08/29/2022 13:34   ECHOCARDIOGRAM COMPLETE  Result Date: 08/18/2022    ECHOCARDIOGRAM REPORT   Patient Name:   Jason [name] [facility] Date of Exam: 08/18/2022 Medical Rec #:  [id number]         Height:       70.0 in Accession #:    [id number]        Weight:       170.0 lb Date of Birth:  [birth year]         BSA:          1.948 m Patient Age:    17 years          BP:           104/61 mmHg Patient Gender: M                 HR:           61 bpm. Exam Location:  [place] Procedure: 2D Echo, Cardiac Doppler and Color Doppler Indications:    S/P Aortic Valve Replacement Z95.2  History:        Patient has prior history of Echocardiogram examinations, most                 recent 07/17/2014. CAD, Prior CABG, Arrythmias:Atrial                 Fibrillation; Risk Factors:Dyslipidemia.                 Aortic  Valve: 23 mm Magna Ease pericardial tissue valve valve is                 present in the aortic position. Procedure Date: 05/2010.  Sonographer:    Mikki Santee RDCS Referring Phys: [id number] [place]  1. S/P AVR with normal mean gradient (12 mmHg) and no AI.  2. Left ventricular ejection fraction, by estimation, is 60 to 65%. The left ventricle has normal function. The left ventricle has no regional wall motion abnormalities. There is moderate concentric left ventricular hypertrophy. Left ventricular diastolic parameters are indeterminate.  3. Right ventricular systolic function is mildly reduced. The right ventricular size is moderately enlarged. There is mildly elevated pulmonary artery systolic pressure.  4. Left atrial size was severely dilated.  5. Right atrial size was severely dilated.  6. The mitral valve is normal in structure. Mild mitral valve regurgitation. No evidence of mitral stenosis. Moderate mitral annular calcification.  7. The aortic valve has been repaired/replaced. Aortic valve regurgitation is not visualized. No aortic stenosis is present. There is a 23 mm Magna Ease pericardial tissue valve valve present in the aortic position. Procedure Date: 05/2010.  8. The inferior vena cava is dilated in size with >50% respiratory variability, suggesting right atrial pressure of 8 mmHg. FINDINGS  Left Ventricle: Left ventricular ejection fraction, by estimation, is 60 to 65%. The left ventricle has normal function. The left ventricle has no regional wall motion abnormalities. The left ventricular internal cavity size was normal in size. There is  moderate concentric left ventricular hypertrophy. Left ventricular diastolic parameters are indeterminate. Right Ventricle: The right ventricular size is moderately enlarged. Right ventricular systolic function is mildly reduced. There is mildly elevated pulmonary artery systolic pressure. The tricuspid regurgitant velocity is 2.88 m/s,  and with an assumed right atrial pressure of 8 mmHg, the estimated right ventricular systolic pressure is 14.4 mmHg. Left Atrium: Left atrial size was severely dilated. Right Atrium: Right atrial size was severely dilated. Pericardium: There is no evidence of pericardial effusion. Mitral Valve: The mitral valve is normal in structure. Moderate mitral annular calcification. Mild mitral valve regurgitation. No evidence of mitral valve stenosis. Tricuspid Valve: The tricuspid valve is normal in structure. Tricuspid valve regurgitation is mild . No evidence of tricuspid stenosis. Aortic Valve: The aortic valve has been repaired/replaced. Aortic valve regurgitation is not visualized. No aortic stenosis is present. Aortic valve mean gradient measures 12.4 mmHg. Aortic valve peak gradient measures 21.5 mmHg. There is a 23 mm Magna Ease pericardial tissue valve valve present in the aortic position. Procedure Date: 05/2010. Pulmonic Valve: The pulmonic valve was normal in structure. Pulmonic valve regurgitation is trivial. No evidence of pulmonic stenosis. Aorta: The aortic root is normal in size and structure. Venous: The inferior vena cava is dilated in size with greater than 50% respiratory variability, suggesting right atrial pressure of 8 mmHg. IAS/Shunts: No atrial level shunt detected by color flow Doppler. Additional Comments: S/P AVR with normal mean gradient (12 mmHg) and no AI.  LEFT VENTRICLE PLAX 2D LVIDd:         3.70 cm LVIDs:         3.00 cm LV PW:         1.40 cm LV IVS:        1.70 cm  LV Volumes (MOD) LV vol d, MOD A2C: 94.0 ml LV vol d, MOD A4C: 83.9 ml LV vol s, MOD A2C: 34.4 ml LV vol s, MOD A4C: 34.1 ml LV SV MOD A2C:     59.6 ml LV SV MOD A4C:     83.9 ml LV SV MOD BP:      57.4 ml RIGHT VENTRICLE  RV Basal diam:  4.50 cm RV Mid diam:    4.30 cm RV S prime:     6.23 cm/s TAPSE (M-mode): 1.0 cm LEFT ATRIUM           Index        RIGHT ATRIUM           Index LA diam:      4.70 cm 2.41 cm/m   RA Area:      27.50 cm LA Vol (A4C): 84.8 ml 43.53 ml/m  RA Volume:   95.60 ml  49.08 ml/m  AORTIC VALVE AV Vmax:           231.60 cm/s AV Vmean:          162.800 cm/s AV VTI:            0.419 m AV Peak Grad:      21.5 mmHg AV Mean Grad:      12.4 mmHg LVOT Vmax:         60.83 cm/s LVOT Vmean:        40.200 cm/s LVOT VTI:          0.115 m LVOT/AV VTI ratio: 0.27  AORTA Ao Root diam: 3.70 cm Ao Asc diam:  3.70 cm TRICUSPID VALVE TR Peak grad:   33.2 mmHg TR Vmax:        288.00 cm/s  SHUNTS Systemic VTI: 0.12 m Kirk Ruths MD Electronically signed by Kirk Ruths MD Signature Date/Time: 08/18/2022/4:51:37 PM    Final    MR LUMBAR SPINE WO CONTRAST  Result Date: 08/07/2022 CLINICAL DATA:  Acute onset of severe low back pain. History of chronic back pain and prostate cancer. Recent Feraheme infusion. EXAM: MRI LUMBAR SPINE WITHOUT CONTRAST TECHNIQUE: Multiplanar, multisequence MR imaging of the lumbar spine was performed. No intravenous contrast was administered. COMPARISON:  Whole-body bone scan 10/04/2021 and 08/11/2020. Lumbar MRI 05/18/2018. Abdominopelvic CT 10/04/2021 and 08/11/2020. FINDINGS: Segmentation: Conventional anatomy assumed, with the last open disc space designated L5-S1.Concordant with prior imaging. Alignment: Chronic grade 1 retrolisthesis at L1-2 and L2-3, similar to previous studies. Vertebrae: Multilevel spondylosis with severe chronic endplate degenerative changes at L1-2. These have markedly progressed from the remote MRI and have shown to be slowly progressive on abdominopelvic CT. There are associated endplate erosions, surrounding sclerosis and vacuum phenomenon. There is L1-2 discal hyperintensity with a small amount of surrounding soft tissue edema. No evidence of osseous metastatic disease or acute osseous abnormality. The T12 and L1 vertebral bodies are chronically ankylosed. The visualized sacroiliac joints appear unremarkable. Conus medullaris: Extends to the T12-L1 level and appears  normal. Paraspinal and other soft tissues: Mild paraspinous edema at L1-2 without focal fluid collection. Disc levels: T12-L1: Chronic interbody ankylosis without spinal stenosis or nerve root encroachment. L1-2: As above, severe degenerative disc disease with slowly progressive advanced endplate degeneration, marrow and surrounding soft tissue edema. No significant spinal stenosis. Mild asymmetric narrowing of the right lateral recess and right foramen. L2-3: Chronic degenerative disc disease with loss of disc height and endplate osteophytes covering diffusely bulging disc material. Mild facet and ligamentous hypertrophy. Borderline spinal stenosis with mild lateral recess and foraminal narrowing bilaterally. L3-4: Chronic degenerative disc disease with disc bulging, facet and ligamentous hypertrophy. Mild lateral recess and foraminal narrowing bilaterally. L4-5: Relatively preserved disc height with annular disc bulging, moderate facet and ligamentous hypertrophy. Resulting mild to moderate multifactorial spinal stenosis with mild lateral recess and foraminal narrowing bilaterally. L5-S1: Remote postsurgical changes on the left with  adequate decompression of the spinal canal. Chronic disc bulging with endplate osteophytes and bilateral facet hypertrophy. Probable chronic postoperative scarring in the left lateral recess. Chronic moderate foraminal narrowing bilaterally. IMPRESSION: 1. Severe endplate degeneration at L1-2 with associated discal hyperintensity, marrow and surrounding soft tissue edema which may certainly contribute to back pain. These findings are slowly progressive from remote lumbar MRI and interval abdominal CTs. Findings are favored to be degenerative, although indolent infection cannot be completely excluded by imaging. 2. No evidence of osseous metastatic disease. 3. Multilevel spondylosis with resulting mild to moderate multifactorial spinal stenosis at L4-5 and chronic moderate foraminal  narrowing bilaterally at L5-S1. 4. Remote postsurgical changes at L5-S1 with adequate decompression of the spinal canal. Electronically Signed   By: Richardean Sale M.D.   On: 08/07/2022 15:45     I spent more 55 minutes for this patient encounter including review of prior medical records, coordination of care with primary/other specialist with greater than 50% of time being face to face/counseling and discussing diagnostics/treatment plan with the patient/family.  Electronically signed by:   Rosiland Oz, MD Infectious Disease Physician [facility] Pager: [phone]

## 2022-09-06 NOTE — Progress Notes (Signed)
Jason [name]                                                                               Jason [name], is a 89 y.o. male, DOB - [birth year], QIO:[id number] Admit date - 08/29/2022    Outpatient Primary MD for the patient is Jason Infante, MD  LOS - 8  days    Brief summary   89 year old gentleman prior history of coronary artery disease s/p CABG, AVR, chronic atrial fibrillation on anticoagulation with Eliquis, obstructive sleep apnea on CPAP, bladder carcinoma s/p TURB, prostate cancer, history of GI bleed with presence of gastric and small bowel AVMs on endoscopy, history of colon cancer s/p right hemicolectomy presents with generalized weakness and fatigue and was found to have a hemoglobin of 8.6 from baseline of 13    Assessment & Plan    Assessment and Plan:   Acute blood loss anemia likely sec to GI bleed from AVM's GI consulted, SBE done showed mmultiple AVM'S in the stomach and duodenum AVM which were ablated.  Noted ulceration in the fundus of the stomach which was biopsied  Anemia panel showed iron 15, saturation 5 On IV iron supplements X 4 doses, plan to discharge on p.o. Holding Eliquis for now Continue Protonix 40 mg BID, start Carafate as per GI   Strep infantarius bacteremia (bovis family) Likely from ??discitis, vertebral osteomyelitis, reports significant back pain  Noted persistent hypotension, SBP's in the 80s Afebrile, with no leukocytosis LA WNL BC X 2, now growing strep species, repeat pending Repeat MRI lumbar concerning for L1-L2 discitis/osteomyelitis, no evidence of epidural collection, edema at the bilateral psoas muscles, without any focal collection, concerning for arachnoiditis ID consulted, appreciate recs, plan to rule out prosthetic valve endocarditis noted poor dentition, rule out recurrence of colon cancer TTE no evidence of vegetation, valve in place, EF 60 to 65%, no regional wall motion abnormality, TEE pending to be done  on 09/11/22 Colonoscopy as an outpatient to rule out recurrence of his colon cancer CT maxillofacial to ruled out sublingual/submandibular abscess Continue IV ceftriaxone BID as per ID for 14 days, then continue daily   Hx of CABG, AVR Patient currently denies any chest pain or shortness of breath. Anticoagulation on hold  Atrial fibrillation, chronic (HCC) Held metoprolol 12.5 mg twice daily as BP soft Eliquis on hold due to GI bleed   OSA (obstructive sleep apnea) On CPAP   Chronic back pain Pain management Outpatient appointment with neurosurgery was rescheduled with Dr. Saintclair Halsted by wife    Hyperlipidemia Continue with Crestor 5 mg 3 times a week           RN Pressure Injury Documentation: Pressure Injury 08/29/22 Vertebral column Lower Stage 2 -  Partial thickness loss of dermis presenting as a shallow open injury with a red, pink wound bed without slough. two quarter sized pressure wounds over vertebra (Active)  08/29/22 1410  Location: Vertebral column  Location Orientation: Lower  Staging: Stage 2 -  Partial thickness loss of dermis presenting as a shallow open injury with a red, pink wound bed without slough.  Wound Description (Comments): two quarter sized pressure wounds over vertebra  Present  on Admission: Yes  Dressing Type Foam - Lift dressing to assess site every shift 09/06/22 0710    Malnutrition Type:  Nutrition Problem: Moderate Malnutrition Etiology: chronic illness (vertebral osteomyelitis)   Malnutrition Characteristics:  Signs/Symptoms: moderate fat depletion, mild muscle depletion, energy intake < or equal to 50% for > or equal to 1 month, percent weight loss   Nutrition Interventions:  Interventions: Ensure Enlive (each supplement provides 350kcal and 20 grams of protein), MVI  Estimated body mass index is 22.46 kg/m as calculated from the following:   Height as of this encounter: '5\' 10"'$  (1.778 m).   Weight as of this encounter: 71  kg.  Code Status: DNR.  DVT Prophylaxis:  SCDs Start: 08/29/22 2210   Level of Care: Level of care: Progressive Family Communication: none at bedside.   Disposition Plan:     Remains inpatient appropriate:  IV antibiotics.   Procedures:  NONE.   Consultants:   ID  Antimicrobials:   Anti-infectives (From admission, onward)    Start     Dose/Rate Route Frequency Ordered Stop   09/19/22 1000  cefTRIAXone (ROCEPHIN) 2 g in sodium chloride 0.9 % 100 mL IVPB        2 g 200 mL/hr over 30 Minutes Intravenous Every 24 hours 09/05/22 1003     09/05/22 1100  cefTRIAXone (ROCEPHIN) 2 g in sodium chloride 0.9 % 100 mL IVPB        2 g 200 mL/hr over 30 Minutes Intravenous Every 12 hours 09/05/22 1003 09/19/22 0959   09/02/22 1500  cefTRIAXone (ROCEPHIN) 2 g in sodium chloride 0.9 % 100 mL IVPB  Status:  Discontinued        2 g 200 mL/hr over 30 Minutes Intravenous Every 24 hours 09/02/22 1344 09/05/22 1003        Medications  Scheduled Meds:  amitriptyline  25 mg Oral QHS   brimonidine  1 drop Both Eyes BID   calcium-vitamin D  1 tablet Oral Daily   cholecalciferol  1,000 Units Oral Daily   cyanocobalamin  1,000 mcg Oral Daily   dorzolamide-timolol  1 drop Both Eyes BID   feeding supplement  237 mL Oral TID BM   gabapentin  300 mg Oral QID   ipratropium  2 spray Each Nare BID   latanoprost  1 drop Both Eyes QHS   lidocaine  1 patch Transdermal Q24H   multivitamin with minerals  1 tablet Oral Daily   pantoprazole  40 mg Oral BID AC   perphenazine  2 mg Oral Once per day on Mon Wed Fri   polyethylene glycol  17 g Oral Daily   prednisoLONE acetate  1 drop Right Eye Daily   psyllium  1 packet Oral Daily   rosuvastatin  5 mg Oral Q M,W,F   senna-docusate  1 tablet Oral QHS   sucralfate  1 g Oral BID   Continuous Infusions:  cefTRIAXone (ROCEPHIN)  IV 2 g (09/06/22 1207)   [START ON 09/19/2022] cefTRIAXone (ROCEPHIN)  IV     PRN Meds:.guaiFENesin, metoprolol tartrate, mouth  rinse, oxyCODONE, traMADol    Subjective:   Jason [name] was seen and examined today. No new complaints.   Objective:   Vitals:   09/05/22 1242 09/05/22 2125 09/06/22 0509 09/06/22 1633  BP: 111/72 101/72 (!) 111/54 107/70  Pulse: (!) 104 92 94 94  Resp: '18 17 20 16  '$ Temp: 98.6 F (37 C) 99 F (37.2 C) (!) 97.3 F (36.3 C) 98.1 F (  36.7 C)  TempSrc: Oral Oral Oral Oral  SpO2: 96% 96% 94% 97%  Weight:      Height:        Intake/Output Summary (Last 24 hours) at 09/06/2022 1809 Last data filed at 09/06/2022 1500 Gross per 24 hour  Intake 500 ml  Output 1200 ml  Net -700 ml   Filed Weights   08/29/22 1305 08/29/22 2044 09/02/22 0634  Weight: 77.1 kg 69.4 kg 71 kg     Exam General: Alert and oriented x 3, NAD Cardiovascular: S1 S2 auscultated, no murmurs, RRR Respiratory: Clear to auscultation bilaterally, no wheezing, rales or rhonchi Gastrointestinal: Soft, nontender, nondistended, + bowel sounds Ext: no pedal edema bilaterally Neuro: AAOx3, Cr N's II- XII. Strength 5/5 upper and lower extremities bilaterally Skin: No rashes Psych: Normal affect and demeanor, alert and oriented x3    Data Reviewed:  I have personally reviewed following labs and imaging studies   CBC Lab Results  Component Value Date   WBC 7.8 09/06/2022   RBC 2.71 (L) 09/06/2022   HGB 7.4 (L) 09/06/2022   HCT 24.2 (L) 09/06/2022   MCV 89.3 09/06/2022   MCH 27.3 09/06/2022   PLT 183 09/06/2022   MCHC 30.6 09/06/2022   RDW 21.3 (H) 09/06/2022   LYMPHSABS 1.0 09/06/2022   MONOABS 0.9 09/06/2022   EOSABS 0.1 09/06/2022   BASOSABS 0.0 59/16/3846     Last metabolic panel Lab Results  Component Value Date   NA 132 (L) 09/06/2022   K 3.2 (L) 09/06/2022   CL 99 09/06/2022   CO2 27 09/06/2022   BUN 9 09/06/2022   CREATININE 0.77 09/06/2022   GLUCOSE 93 09/06/2022   GFRNONAA >60 09/06/2022   GFRAA >60 11/01/2016   CALCIUM 7.9 (L) 09/06/2022   PROT 5.9 (L) 09/06/2022   ALBUMIN  2.3 (L) 09/06/2022   LABGLOB 3.2 03/29/2016   BILITOT 0.2 (L) 09/06/2022   ALKPHOS 69 09/06/2022   AST 18 09/06/2022   ALT 12 09/06/2022   ANIONGAP 6 09/06/2022    CBG (last 3)  No results for input(s): "GLUCAP" in the last 72 hours.    Coagulation Profile: No results for input(s): "INR", "PROTIME" in the last 168 hours.   Radiology Studies: No results found.     Hosie Poisson M.D. Jason [name] 09/06/2022, 6:09 PM  Available via Epic secure chat 7am-7pm After 7 pm, please refer to night coverage provider listed on amion.

## 2022-09-07 DIAGNOSIS — K31811 Angiodysplasia of stomach and duodenum with bleeding: Secondary | ICD-10-CM | POA: Diagnosis not present

## 2022-09-07 DIAGNOSIS — K31819 Angiodysplasia of stomach and duodenum without bleeding: Secondary | ICD-10-CM | POA: Diagnosis not present

## 2022-09-07 DIAGNOSIS — A491 Streptococcal infection, unspecified site: Secondary | ICD-10-CM | POA: Diagnosis not present

## 2022-09-07 LAB — MAGNESIUM: Magnesium: 2.1 mg/dL (ref 1.7–2.4)

## 2022-09-07 LAB — BASIC METABOLIC PANEL
Anion gap: 7 (ref 5–15)
BUN: 11 mg/dL (ref 8–23)
CO2: 27 mmol/L (ref 22–32)
Calcium: 7.8 mg/dL — ABNORMAL LOW (ref 8.9–10.3)
Chloride: 100 mmol/L (ref 98–111)
Creatinine, Ser: 0.74 mg/dL (ref 0.61–1.24)
GFR, Estimated: >60 mL/min (ref >60)
Glucose, Bld: 105 mg/dL — ABNORMAL HIGH (ref 70–99)
Potassium: 3.1 mmol/L — ABNORMAL LOW (ref 3.5–5.1)
Sodium: 134 mmol/L — ABNORMAL LOW (ref 135–145)

## 2022-09-07 LAB — CBC WITH DIFFERENTIAL/PLATELET
Abs Immature Granulocytes: 0.06 10*3/uL (ref 0.00–0.07)
Basophils Absolute: 0.1 10*3/uL (ref 0.0–0.1)
Basophils Relative: 1 %
Eosinophils Absolute: 0.1 10*3/uL (ref 0.0–0.5)
Eosinophils Relative: 1 %
HCT: 24.7 % — ABNORMAL LOW (ref 39.0–52.0)
Hemoglobin: 7.5 g/dL — ABNORMAL LOW (ref 13.0–17.0)
Immature Granulocytes: 1 %
Lymphocytes Relative: 13 %
Lymphs Abs: 1 10*3/uL (ref 0.7–4.0)
MCH: 27.4 pg (ref 26.0–34.0)
MCHC: 30.4 g/dL (ref 30.0–36.0)
MCV: 90.1 fL (ref 80.0–100.0)
Monocytes Absolute: 0.9 10*3/uL (ref 0.1–1.0)
Monocytes Relative: 12 %
Neutro Abs: 5.8 10*3/uL (ref 1.7–7.7)
Neutrophils Relative %: 72 %
Platelets: 188 10*3/uL (ref 150–400)
RBC: 2.74 MIL/uL — ABNORMAL LOW (ref 4.22–5.81)
RDW: 21.5 % — ABNORMAL HIGH (ref 11.5–15.5)
WBC: 7.8 10*3/uL (ref 4.0–10.5)
nRBC: 0 % (ref 0.0–0.2)

## 2022-09-07 MED ORDER — GUAIFENESIN-DM 100-10 MG/5ML PO SYRP
10.0000 mL | ORAL_SOLUTION | ORAL | Status: DC | PRN
  Administered 2022-09-07 – 2022-09-10 (×4): 10 mL via ORAL
  Filled 2022-09-07 (×6): qty 10

## 2022-09-07 MED ORDER — POTASSIUM CHLORIDE CRYS ER 20 MEQ PO TBCR
40.0000 meq | EXTENDED_RELEASE_TABLET | Freq: Once | ORAL | Status: AC
  Administered 2022-09-07: 40 meq via ORAL
  Filled 2022-09-07: qty 2

## 2022-09-07 MED ORDER — POTASSIUM CHLORIDE CRYS ER 20 MEQ PO TBCR
20.0000 meq | EXTENDED_RELEASE_TABLET | Freq: Once | ORAL | Status: AC
  Administered 2022-09-07: 20 meq via ORAL
  Filled 2022-09-07: qty 1

## 2022-09-07 MED ORDER — IPRATROPIUM-ALBUTEROL 0.5-2.5 (3) MG/3ML IN SOLN: 3.0000 mL | Freq: Four times a day (QID) | RESPIRATORY_TRACT | Status: DC | PRN

## 2022-09-07 NOTE — Progress Notes (Signed)
Physical Therapy Treatment Patient Details Name: Jason [name] MRN: [id number] DOB: [birth year] Today's Date: 09/07/2022   History of Present Illness 89 year old male admitted 08/29/22 for Acute blood loss anemia likely secondary to GI bleed from AVMs. PMHx: coronary artery disease s/p CABG, AVR, chronic atrial fibrillation on anticoagulation with Eliquis, obstructive sleep apnea on CPAP, bladder carcinoma s/p TURB, prostate cancer, GI bleed with presence of gastric and small bowel AVMs on endoscopy, colon cancer s/p right hemicolectomy    PT Comments    General Comments: AxO x 2 pleasant and following commands.  Assisted with amb required + 2 assist. General transfer comment: required increased assist + 2 with Mod posterior lean and 75% VC's turn competion and back steps as pt was attempting to sit prior to transfer completion.  Very unsteady.  General Gait Details: assisted with amb a limited distance in hallway + 2 assist with recliner following behind for safety.  VERY unsteady gait with severe RIGHT lean.  Poor self correction.  HIGH FALL RISK. Pt was Indep amb with his Rollator prior to admit and living with his wife at [facility].    Pt will need ST Rehab at SNF to address mobility and functional decline prior to safely returning home.   Recommendations for follow up therapy are one component of a multi-disciplinary discharge planning process, led by the attending physician.  Recommendations may be updated based on patient status, additional functional criteria and insurance authorization.  Follow Up Recommendations  Skilled nursing-short term rehab (<3 hours/day) Can patient physically be transported by private vehicle: No   Assistance Recommended at Discharge Intermittent Supervision/Assistance  Patient can return home with the following A lot of help with walking and/or transfers;A lot of help with bathing/dressing/bathroom;Assistance with cooking/housework   Equipment  Recommendations  None recommended by PT    Recommendations for Other Services       Precautions / Restrictions Precautions Precautions: Back;Fall Precaution Comments: back for comfort Restrictions Weight Bearing Restrictions: No     Mobility  Bed Mobility               General bed mobility comments: OOB in recliner    Transfers Overall transfer level: Needs assistance Equipment used: Rolling walker (2 wheels) Transfers: Sit to/from Stand Sit to Stand: Mod assist, +2 safety/equipment, From elevated surface           General transfer comment: required increased assist + 2 with Mod posterior lean and 75% VC's turn competion and back steps as pt was attempting to sit prior to transfer completion.  Very unsteady.    Ambulation/Gait Ambulation/Gait assistance: Min assist Gait Distance (Feet): 18 Feet Assistive device: Rolling walker (2 wheels) Gait Pattern/deviations: Step-through pattern, Decreased stride length, Shuffle Gait velocity: decreased     General Gait Details: assisted with amb a limited distance in hallway + 2 assist with recliner following behind for safety.  VERY unsteady gait with severe RIGHT lean.  Poor self correction.  HIGH FALL RISK.   Stairs             Wheelchair Mobility    Modified Rankin (Stroke Patients Only)       Balance                                            Cognition Arousal/Alertness: Awake/alert Behavior During Therapy: WFL for tasks assessed/performed Overall Cognitive Status:  Within Functional Limits for tasks assessed                                 General Comments: AxO x 2 pleasant and following commands.        Exercises      General Comments        Pertinent Vitals/Pain Pain Assessment Pain Assessment: Faces Faces Pain Scale: Hurts a little bit Pain Location: low back Pain Descriptors / Indicators: Grimacing, Aching, Sore Pain Intervention(s): Monitored  during session, Repositioned    Home Living                          Prior Function            PT Goals (current goals can now be found in the care plan section) Progress towards PT goals: Progressing toward goals    Frequency    Min 2X/week      PT Plan Current plan remains appropriate    Co-evaluation              AM-PAC PT "6 Clicks" Mobility   Outcome Measure  Help needed turning from your back to your side while in a flat bed without using bedrails?: A Lot Help needed moving from lying on your back to sitting on the side of a flat bed without using bedrails?: A Lot Help needed moving to and from a bed to a chair (including a wheelchair)?: A Lot Help needed standing up from a chair using your arms (e.g., wheelchair or bedside chair)?: A Lot Help needed to walk in hospital room?: A Lot Help needed climbing 3-5 steps with a railing? : Total 6 Click Score: 11    End of Session Equipment Utilized During Treatment: Gait belt Activity Tolerance: Patient limited by fatigue Patient left: in chair;with call bell/phone within reach;with chair alarm set;with family/visitor present Nurse Communication: Mobility status PT Visit Diagnosis: Difficulty in walking, not elsewhere classified (R26.2);Muscle weakness (generalized) (M62.81)     Time: 9357-0177 PT Time Calculation (min) (ACUTE ONLY): 24 min  Charges:  $Gait Training: 8-22 mins $Therapeutic Activity: 8-22 mins                    Rica Koyanagi  PTA [facility] M-F          [phone] Weekend pager [phone]

## 2022-09-07 NOTE — Progress Notes (Signed)
[facility]                                                                               Jason [name], is a 89 y.o. male, DOB - 01-24-1926, GYF:[id number] Admit date - 08/29/2022    Outpatient Primary MD for the patient is [name], MD  LOS - 9  days    Brief summary   89 year old gentleman prior history of coronary artery disease s/p CABG, AVR, chronic atrial fibrillation on anticoagulation with Eliquis, obstructive sleep apnea on CPAP, bladder carcinoma s/p TURB, prostate cancer, history of GI bleed with presence of gastric and small bowel AVMs on endoscopy, history of colon cancer s/p right hemicolectomy presents with generalized weakness and fatigue and was found to have a hemoglobin of 8.6 from baseline of 13 . GI consulted and he was found to have multiple AVM's in the stomach and duodenum , were ablated.    Assessment & Plan    Assessment and Plan:   Acute blood loss anemia likely sec to GI bleed from AVM's GI consulted, SBE done showed mmultiple AVM'S in the stomach and duodenum AVM which were ablated.  Noted ulceration in the fundus of the stomach which was biopsied  Anemia panel showed iron 15, saturation 5 On IV iron supplements X 4 doses, plan to discharge on p.o. Holding Eliquis for now Continue Protonix 40 mg BID, start Carafate as per GI, with 1 gm BID.     Strep infantarius bacteremia (bovis family) Likely from discitis vs vertebral OM .  Initial blood cultures growing strep, repeat cultures are pending.  MRI of the lumbar spine concerning for L1 L2 discitis and OM.  ID consulted and recommended TEE, to rule out prosthetic valve endocarditis  in view of his poor dentition.  He will also need outpatient colonoscopy to check colon cancer.  TEE Is scheduled for 09/11/2022.  ID recommended ROCEPHIN BID for a total of 14 days given arachnoiditis before switching to ceftriaxone 2g IV daily.  He will probably need atleast 6 weeks of antibiotics.      Hx of CABG, AVR No chest pain or sob currently.  Anticoagulation on hold due to GI bleed.   Atrial fibrillation, chronic (HCC) Rate controlled. On metoprolol iv.  Anti coagulation on hold due to GI bleed.     OSA (obstructive sleep apnea) On CPAP   Chronic back pain Pain control and physical therapy.  Outpatient appointment with neurosurgery was rescheduled with Dr. Saintclair Halsted by wife    Hyperlipidemia Continue with Crestor 5 mg three times a week.            RN Pressure Injury Documentation: Pressure Injury 08/29/22 Vertebral column Lower Stage 2 -  Partial thickness loss of dermis presenting as a shallow open injury with a red, pink wound bed without slough. two quarter sized pressure wounds over vertebra (Active)  08/29/22 1410  Location: Vertebral column  Location Orientation: Lower  Staging: Stage 2 -  Partial thickness loss of dermis presenting as a shallow open injury with a red, pink wound bed without slough.  Wound Description (Comments): two quarter sized pressure wounds over vertebra  Present on Admission: Yes  Dressing Type Foam - Lift dressing to assess site every shift;Other (Comment) 09/07/22 0700    Malnutrition Type:  Nutrition Problem: Moderate Malnutrition Etiology: chronic illness (vertebral osteomyelitis)   Malnutrition Characteristics:  Signs/Symptoms: moderate fat depletion, mild muscle depletion, energy intake < or equal to 50% for > or equal to 1 month, percent weight loss   Nutrition Interventions:  Interventions: Ensure Enlive (each supplement provides 350kcal and 20 grams of protein), MVI  Estimated body mass index is 22.46 kg/m as calculated from the following:   Height as of this encounter: '5\' 10"'$  (1.778 m).   Weight as of this encounter: 71 kg.  Code Status: DNR.  DVT Prophylaxis:  SCDs Start: 08/29/22 2210   Level of Care: Level of care: Progressive Family Communication: none at bedside.   Disposition Plan:     Remains  inpatient appropriate:  IV antibiotics.   Procedures:  TTE.  TEE scheduled for 09/11/22.  Consultants:   ID  Antimicrobials:   Anti-infectives (From admission, onward)    Start     Dose/Rate Route Frequency Ordered Stop   09/19/22 1000  cefTRIAXone (ROCEPHIN) 2 g in sodium chloride 0.9 % 100 mL IVPB        2 g 200 mL/hr over 30 Minutes Intravenous Every 24 hours 09/05/22 1003     09/05/22 1100  cefTRIAXone (ROCEPHIN) 2 g in sodium chloride 0.9 % 100 mL IVPB        2 g 200 mL/hr over 30 Minutes Intravenous Every 12 hours 09/05/22 1003 09/19/22 0959   09/02/22 1500  cefTRIAXone (ROCEPHIN) 2 g in sodium chloride 0.9 % 100 mL IVPB  Status:  Discontinued        2 g 200 mL/hr over 30 Minutes Intravenous Every 24 hours 09/02/22 1344 09/05/22 1003        Medications  Scheduled Meds:  amitriptyline  25 mg Oral QHS   brimonidine  1 drop Both Eyes BID   calcium-vitamin D  1 tablet Oral Daily   cholecalciferol  1,000 Units Oral Daily   cyanocobalamin  1,000 mcg Oral Daily   dorzolamide-timolol  1 drop Both Eyes BID   feeding supplement  237 mL Oral TID BM   gabapentin  300 mg Oral QID   ipratropium  2 spray Each Nare BID   latanoprost  1 drop Both Eyes QHS   lidocaine  1 patch Transdermal Q24H   multivitamin with minerals  1 tablet Oral Daily   pantoprazole  40 mg Oral BID AC   perphenazine  2 mg Oral Once per day on Mon Wed Fri   polyethylene glycol  17 g Oral Daily   prednisoLONE acetate  1 drop Right Eye Daily   psyllium  1 packet Oral Daily   rosuvastatin  5 mg Oral Q M,W,F   senna-docusate  1 tablet Oral QHS   sucralfate  1 g Oral BID   Continuous Infusions:  cefTRIAXone (ROCEPHIN)  IV 2 g (09/07/22 1026)   [START ON 09/19/2022] cefTRIAXone (ROCEPHIN)  IV     PRN Meds:.guaiFENesin, metoprolol tartrate, mouth rinse, oxyCODONE, traMADol    Subjective:   Daniil Labarge was seen and examined today.  Coughing and wheezing.   Objective:   Vitals:   09/06/22 1633  09/06/22 2139 09/07/22 0459 09/07/22 1340  BP: 107/70 121/68 112/68 98/63  Pulse: 94 95 85 81  Resp: '16 18 16 16  '$ Temp: 98.1 F (36.7 C) 98.2 F (36.8 C) 97.8 F (36.6 C) 98 F (36.7  C)  TempSrc: Oral Oral Oral Oral  SpO2: 97% 95% 95% 100%  Weight:      Height:        Intake/Output Summary (Last 24 hours) at 09/07/2022 1431 Last data filed at 09/07/2022 1424 Gross per 24 hour  Intake 477 ml  Output 1600 ml  Net -1123 ml    Filed Weights   08/29/22 1305 08/29/22 2044 09/02/22 0634  Weight: 77.1 kg 69.4 kg 71 kg     Exam General exam: Appears calm and comfortable  Respiratory system: Clear to auscultation. Respiratory effort normal. Cardiovascular system: S1 & S2 heard, irregularly irregular. No JVD.  Gastrointestinal system: Abdomen is nondistended, soft and nontender.  Central nervous system: Alert and oriented. No focal neurological deficits. Extremities: Symmetric 5 x 5 power. Skin: No rashes, lesions or ulcers Psychiatry: Mood & affect appropriate.     Data Reviewed:  I have personally reviewed following labs and imaging studies   CBC Lab Results  Component Value Date   WBC 7.8 09/07/2022   RBC 2.74 (L) 09/07/2022   HGB 7.5 (L) 09/07/2022   HCT 24.7 (L) 09/07/2022   MCV 90.1 09/07/2022   MCH 27.4 09/07/2022   PLT 188 09/07/2022   MCHC 30.4 09/07/2022   RDW 21.5 (H) 09/07/2022   LYMPHSABS 1.0 09/07/2022   MONOABS 0.9 09/07/2022   EOSABS 0.1 09/07/2022   BASOSABS 0.1 26/33/3545     Last metabolic panel Lab Results  Component Value Date   NA 134 (L) 09/07/2022   K 3.1 (L) 09/07/2022   CL 100 09/07/2022   CO2 27 09/07/2022   BUN 11 09/07/2022   CREATININE 0.74 09/07/2022   GLUCOSE 105 (H) 09/07/2022   GFRNONAA >60 09/07/2022   GFRAA >60 11/01/2016   CALCIUM 7.8 (L) 09/07/2022   PROT 5.9 (L) 09/06/2022   ALBUMIN 2.3 (L) 09/06/2022   LABGLOB 3.2 03/29/2016   BILITOT 0.2 (L) 09/06/2022   ALKPHOS 69 09/06/2022   AST 18 09/06/2022   ALT 12  09/06/2022   ANIONGAP 7 09/07/2022    CBG (last 3)  No results for input(s): "GLUCAP" in the last 72 hours.    Coagulation Profile: No results for input(s): "INR", "PROTIME" in the last 168 hours.   Radiology Studies: No results found.     Hosie Poisson M.D. [facility] 09/07/2022, 2:31 PM  Available via Epic secure chat 7am-7pm After 7 pm, please refer to night coverage provider listed on amion.

## 2022-09-08 DIAGNOSIS — I482 Chronic atrial fibrillation, unspecified: Secondary | ICD-10-CM | POA: Diagnosis not present

## 2022-09-08 DIAGNOSIS — E440 Moderate protein-calorie malnutrition: Secondary | ICD-10-CM

## 2022-09-08 DIAGNOSIS — D62 Acute posthemorrhagic anemia: Secondary | ICD-10-CM | POA: Diagnosis not present

## 2022-09-08 DIAGNOSIS — A491 Streptococcal infection, unspecified site: Secondary | ICD-10-CM | POA: Diagnosis not present

## 2022-09-08 DIAGNOSIS — K31811 Angiodysplasia of stomach and duodenum with bleeding: Secondary | ICD-10-CM | POA: Diagnosis not present

## 2022-09-08 LAB — BASIC METABOLIC PANEL
Anion gap: 7 (ref 5–15)
BUN: 8 mg/dL (ref 8–23)
CO2: 27 mmol/L (ref 22–32)
Calcium: 7.8 mg/dL — ABNORMAL LOW (ref 8.9–10.3)
Chloride: 99 mmol/L (ref 98–111)
Creatinine, Ser: 0.69 mg/dL (ref 0.61–1.24)
GFR, Estimated: >60 mL/min (ref >60)
Glucose, Bld: 97 mg/dL (ref 70–99)
Potassium: 3.7 mmol/L (ref 3.5–5.1)
Sodium: 133 mmol/L — ABNORMAL LOW (ref 135–145)

## 2022-09-08 LAB — CBC WITH DIFFERENTIAL/PLATELET
Abs Immature Granulocytes: 0.05 10*3/uL (ref 0.00–0.07)
Basophils Absolute: 0.1 10*3/uL (ref 0.0–0.1)
Basophils Relative: 1 %
Eosinophils Absolute: 0.1 10*3/uL (ref 0.0–0.5)
Eosinophils Relative: 1 %
HCT: 28.3 % — ABNORMAL LOW (ref 39.0–52.0)
Hemoglobin: 8.4 g/dL — ABNORMAL LOW (ref 13.0–17.0)
Immature Granulocytes: 1 %
Lymphocytes Relative: 11 %
Lymphs Abs: 1 10*3/uL (ref 0.7–4.0)
MCH: 26.8 pg (ref 26.0–34.0)
MCHC: 29.7 g/dL — ABNORMAL LOW (ref 30.0–36.0)
MCV: 90.4 fL (ref 80.0–100.0)
Monocytes Absolute: 0.9 10*3/uL (ref 0.1–1.0)
Monocytes Relative: 10 %
Neutro Abs: 6.8 10*3/uL (ref 1.7–7.7)
Neutrophils Relative %: 76 %
Platelets: 204 10*3/uL (ref 150–400)
RBC: 3.13 MIL/uL — ABNORMAL LOW (ref 4.22–5.81)
RDW: 21.5 % — ABNORMAL HIGH (ref 11.5–15.5)
WBC: 8.9 10*3/uL (ref 4.0–10.5)
nRBC: 0 % (ref 0.0–0.2)

## 2022-09-08 LAB — CULTURE, BLOOD (ROUTINE X 2)
Culture: NO GROWTH
Culture: NO GROWTH
Special Requests: ADEQUATE
Special Requests: ADEQUATE

## 2022-09-08 NOTE — Plan of Care (Signed)
  Problem: Education: Goal: Knowledge of General Education information will improve Description: Including pain rating scale, medication(s)/side effects and non-pharmacologic comfort measures Outcome: Progressing   Problem: Health Behavior/Discharge Planning: Goal: Ability to manage health-related needs will improve Outcome: Progressing   Problem: Clinical Measurements: Goal: Will remain free from infection Outcome: Progressing Goal: Diagnostic test results will improve Outcome: Progressing   Problem: Activity: Goal: Risk for activity intolerance will decrease Outcome: Progressing   Problem: Safety: Goal: Ability to remain free from injury will improve Outcome: Progressing

## 2022-09-08 NOTE — Progress Notes (Signed)
[facility]                                                                               Jason [name], is a 89 y.o. male, DOB - 12-10-1925, JGG:[id number] Admit date - 08/29/2022    Outpatient Primary MD for the patient is Jason Infante, MD  LOS - 10  days    Brief summary   89 year old gentleman prior history of coronary artery disease s/p CABG, AVR, chronic atrial fibrillation on anticoagulation with Eliquis, obstructive sleep apnea on CPAP, bladder carcinoma s/p TURB, prostate cancer, history of GI bleed with presence of gastric and small bowel AVMs on endoscopy, history of colon cancer s/p right hemicolectomy presents with generalized weakness and fatigue and was found to have a hemoglobin of 8.6 from baseline of 13 . GI consulted and he was found to have multiple AVM's in the stomach and duodenum , were ablated.  He is currently awaiting TEE scheduled on Monday.    Assessment & Plan    Assessment and Plan:   Acute blood loss anemia likely sec to GI bleed from AVM's GI consulted, SBE done showed mmultiple AVM'S in the stomach and duodenum AVM which were ablated.  Noted ulceration in the fundus of the stomach which was biopsied  Anemia panel showed iron 15, saturation 5 On IV iron supplements X 4 doses, plan to discharge on p.o. Holding Eliquis for now Continue Protonix 40 mg BID, start Carafate as per GI, with 1 gm BID.   No bloody bowel movements or melena.  Hemoglobin has improved to 8.4 today.    Strep infantarius bacteremia (bovis family) Likely from discitis vs vertebral OM .  Initial blood cultures growing strep, repeat cultures are pending.  MRI of the lumbar spine concerning for L1 L2 discitis and OM.  ID consulted and recommended TEE, to rule out prosthetic valve endocarditis  in view of his poor dentition.  He will also need outpatient colonoscopy to check colon cancer.  TEE Is scheduled for 09/11/2022.  ID recommended ROCEPHIN BID for a total of  14 days given arachnoiditis before switching to ceftriaxone 2g IV daily.  He will probably need atleast 6 weeks of antibiotics.   No fever or chills today.    Hx of CABG, AVR No chest pain or sob currently.  Anticoagulation on hold due to GI bleed.   Atrial fibrillation, chronic (HCC) Rate controlled. On metoprolol iv.  Anti coagulation on hold due to GI bleed.     OSA (obstructive sleep apnea) On CPAP   Chronic back pain Pain control and physical therapy.  Outpatient appointment with neurosurgery was rescheduled with Dr. Saintclair Halsted by wife    Hyperlipidemia Continue with Crestor 5 mg three times a week.            RN Pressure Injury Documentation: Pressure Injury 08/29/22 Vertebral column Lower Stage 2 -  Partial thickness loss of dermis presenting as a shallow open injury with a red, pink wound bed without slough. two quarter sized pressure wounds over vertebra (Active)  08/29/22 1410  Location: Vertebral column  Location Orientation: Lower  Staging: Stage 2 -  Partial thickness loss of dermis presenting  as a shallow open injury with a red, pink wound bed without slough.  Wound Description (Comments): two quarter sized pressure wounds over vertebra  Present on Admission: Yes  Dressing Type Foam - Lift dressing to assess site every shift 09/07/22 0730    Malnutrition Type:  Nutrition Problem: Moderate Malnutrition Etiology: chronic illness (vertebral osteomyelitis)   Malnutrition Characteristics:  Signs/Symptoms: moderate fat depletion, mild muscle depletion, energy intake < or equal to 50% for > or equal to 1 month, percent weight loss   Nutrition Interventions:  Interventions: Ensure Enlive (each supplement provides 350kcal and 20 grams of protein), MVI  Estimated body mass index is 22.46 kg/m as calculated from the following:   Height as of this encounter: '5\' 10"'$  (1.778 m).   Weight as of this encounter: 71 kg.  Code Status: DNR.  DVT Prophylaxis:  SCDs  Start: 08/29/22 2210   Level of Care: Level of care: Progressive Family Communication: none at bedside.   Disposition Plan:     Remains inpatient appropriate:  IV antibiotics.   Procedures:  TTE.  TEE scheduled for 09/11/22.  Consultants:   ID  Antimicrobials:   Anti-infectives (From admission, onward)    Start     Dose/Rate Route Frequency Ordered Stop   09/19/22 1000  cefTRIAXone (ROCEPHIN) 2 g in sodium chloride 0.9 % 100 mL IVPB        2 g 200 mL/hr over 30 Minutes Intravenous Every 24 hours 09/05/22 1003     09/05/22 1100  cefTRIAXone (ROCEPHIN) 2 g in sodium chloride 0.9 % 100 mL IVPB        2 g 200 mL/hr over 30 Minutes Intravenous Every 12 hours 09/05/22 1003 09/19/22 0959   09/02/22 1500  cefTRIAXone (ROCEPHIN) 2 g in sodium chloride 0.9 % 100 mL IVPB  Status:  Discontinued        2 g 200 mL/hr over 30 Minutes Intravenous Every 24 hours 09/02/22 1344 09/05/22 1003        Medications  Scheduled Meds:  amitriptyline  25 mg Oral QHS   brimonidine  1 drop Both Eyes BID   calcium-vitamin D  1 tablet Oral Daily   cholecalciferol  1,000 Units Oral Daily   cyanocobalamin  1,000 mcg Oral Daily   dorzolamide-timolol  1 drop Both Eyes BID   feeding supplement  237 mL Oral TID BM   gabapentin  300 mg Oral QID   latanoprost  1 drop Both Eyes QHS   lidocaine  1 patch Transdermal Q24H   multivitamin with minerals  1 tablet Oral Daily   pantoprazole  40 mg Oral BID AC   perphenazine  2 mg Oral Once per day on Mon Wed Fri   polyethylene glycol  17 g Oral Daily   prednisoLONE acetate  1 drop Right Eye Daily   psyllium  1 packet Oral Daily   rosuvastatin  5 mg Oral Q M,W,F   senna-docusate  1 tablet Oral QHS   sucralfate  1 g Oral BID   Continuous Infusions:  cefTRIAXone (ROCEPHIN)  IV 2 g (09/08/22 1056)   [START ON 09/19/2022] cefTRIAXone (ROCEPHIN)  IV     PRN Meds:.guaiFENesin-dextromethorphan, ipratropium-albuterol, metoprolol tartrate, mouth rinse, oxyCODONE,  traMADol    Subjective:   [name] was seen and examined today.  Remains on RA. Cough has improved. Family at bedside. No new complaints.   Objective:   Vitals:   09/07/22 0459 09/07/22 1340 09/07/22 2106 09/08/22 0551  BP: 112/68 98/63 [name])  141/82 133/81  Pulse: 85 81 99 88  Resp: '16 16 16 12  '$ Temp: 97.8 F (36.6 C) 98 F (36.7 C) 98.1 F (36.7 C) (!) 97.5 F (36.4 C)  TempSrc: Oral Oral Oral Oral  SpO2: 95% 100% 97% 95%  Weight:      Height:        Intake/Output Summary (Last 24 hours) at 09/08/2022 1417 Last data filed at 09/08/2022 0552 Gross per 24 hour  Intake 120 ml  Output 750 ml  Net -630 ml    Filed Weights   08/29/22 1305 08/29/22 2044 09/02/22 0634  Weight: 77.1 kg 69.4 kg 71 kg     Exam General exam: elderly gentleman, not in distress.  Respiratory system: Clear to auscultation. Respiratory effort normal. Cardiovascular system: S1 & S2 heard, RRR. No JVD, . No pedal edema. Gastrointestinal system: Abdomen is nondistended, soft and nontender.  Central nervous system: Alert and oriented to place and person only.  Extremities: no pedal edema.  Skin: No rashes,  Psychiatry: no agitation.     Data Reviewed:  I have personally reviewed following labs and imaging studies   CBC Lab Results  Component Value Date   WBC 8.9 09/08/2022   RBC 3.13 (L) 09/08/2022   HGB 8.4 (L) 09/08/2022   HCT 28.3 (L) 09/08/2022   MCV 90.4 09/08/2022   MCH 26.8 09/08/2022   PLT 204 09/08/2022   MCHC 29.7 (L) 09/08/2022   RDW 21.5 (H) 09/08/2022   LYMPHSABS 1.0 09/08/2022   MONOABS 0.9 09/08/2022   EOSABS 0.1 09/08/2022   BASOSABS 0.1 53/97/6734     Last metabolic panel Lab Results  Component Value Date   NA 133 (L) 09/08/2022   K 3.7 09/08/2022   CL 99 09/08/2022   CO2 27 09/08/2022   BUN 8 09/08/2022   CREATININE 0.69 09/08/2022   GLUCOSE 97 09/08/2022   GFRNONAA >60 09/08/2022   GFRAA >60 11/01/2016   CALCIUM 7.8 (L) 09/08/2022   PROT 5.9  (L) 09/06/2022   ALBUMIN 2.3 (L) 09/06/2022   LABGLOB 3.2 03/29/2016   BILITOT 0.2 (L) 09/06/2022   ALKPHOS 69 09/06/2022   AST 18 09/06/2022   ALT 12 09/06/2022   ANIONGAP 7 09/08/2022    CBG (last 3)  No results for input(s): "GLUCAP" in the last 72 hours.    Coagulation Profile: No results for input(s): "INR", "PROTIME" in the last 168 hours.   Radiology Studies: No results found.     Hosie Poisson M.D. [facility] 09/08/2022, 2:17 PM  Available via Epic secure chat 7am-7pm After 7 pm, please refer to night coverage provider listed on amion.

## 2022-09-08 NOTE — Progress Notes (Signed)
Pt had 8  beats run  non-sustained and asymptomatic. MD Karleen Hampshire notified. Will continue with plan of care.

## 2022-09-09 ENCOUNTER — Inpatient Hospital Stay (HOSPITAL_COMMUNITY): Payer: Medicare Other

## 2022-09-09 DIAGNOSIS — A491 Streptococcal infection, unspecified site: Secondary | ICD-10-CM | POA: Diagnosis not present

## 2022-09-09 DIAGNOSIS — D62 Acute posthemorrhagic anemia: Secondary | ICD-10-CM | POA: Diagnosis not present

## 2022-09-09 DIAGNOSIS — K31811 Angiodysplasia of stomach and duodenum with bleeding: Secondary | ICD-10-CM | POA: Diagnosis not present

## 2022-09-09 DIAGNOSIS — I482 Chronic atrial fibrillation, unspecified: Secondary | ICD-10-CM | POA: Diagnosis not present

## 2022-09-09 LAB — CBC WITH DIFFERENTIAL/PLATELET
Abs Immature Granulocytes: 0.06 10*3/uL (ref 0.00–0.07)
Basophils Absolute: 0.1 10*3/uL (ref 0.0–0.1)
Basophils Relative: 1 %
Eosinophils Absolute: 0.1 10*3/uL (ref 0.0–0.5)
Eosinophils Relative: 1 %
HCT: 29.8 % — ABNORMAL LOW (ref 39.0–52.0)
Hemoglobin: 9.1 g/dL — ABNORMAL LOW (ref 13.0–17.0)
Immature Granulocytes: 1 %
Lymphocytes Relative: 10 %
Lymphs Abs: 1 10*3/uL (ref 0.7–4.0)
MCH: 27.3 pg (ref 26.0–34.0)
MCHC: 30.5 g/dL (ref 30.0–36.0)
MCV: 89.5 fL (ref 80.0–100.0)
Monocytes Absolute: 1.1 10*3/uL — ABNORMAL HIGH (ref 0.1–1.0)
Monocytes Relative: 11 %
Neutro Abs: 8 10*3/uL — ABNORMAL HIGH (ref 1.7–7.7)
Neutrophils Relative %: 76 %
Platelets: 210 10*3/uL (ref 150–400)
RBC: 3.33 MIL/uL — ABNORMAL LOW (ref 4.22–5.81)
RDW: 21 % — ABNORMAL HIGH (ref 11.5–15.5)
WBC: 10.3 10*3/uL (ref 4.0–10.5)
nRBC: 0 % (ref 0.0–0.2)

## 2022-09-09 NOTE — Progress Notes (Signed)
[facility]                                                                               Jason [name], is a 89 y.o. male, DOB - 1926/03/19, [id number]:[id number] Admit date - 08/29/2022    Outpatient Primary MD for the patient is [name], MD  LOS - 11  days    Brief summary   89 year old gentleman prior history of coronary artery disease s/p CABG, AVR, chronic atrial fibrillation on anticoagulation with Eliquis, obstructive sleep apnea on CPAP, bladder carcinoma s/p TURB, prostate cancer, history of GI bleed with presence of gastric and small bowel AVMs on endoscopy, history of colon cancer s/p right hemicolectomy presents with generalized weakness and fatigue and was found to have a hemoglobin of 8.6 from baseline of 13 . GI consulted and he was found to have multiple AVM's in the stomach and duodenum , were ablated.  He is currently awaiting TEE scheduled on Monday.    Assessment & Plan    Assessment and Plan:   Acute blood loss anemia likely sec to GI bleed from AVM's GI consulted, SBE done showed mmultiple AVM'S in the stomach and duodenum AVM which were ablated.  Noted ulceration in the fundus of the stomach which was biopsied  Anemia panel showed iron 15, saturation 5 On IV iron supplements X 4 doses, plan to discharge on p.o. Holding Eliquis for now Continue Protonix 40 mg BID, start Carafate as per GI, with 1 gm BID.   No bloody bowel movements or melena.  Hemoglobin has improved to 9.1 today.    Strep infantarius bacteremia (bovis family) Likely from discitis vs vertebral OM .  Initial blood cultures growing strep, repeat cultures are pending.  MRI of the lumbar spine concerning for L1 L2 discitis and OM.  ID consulted and recommended TEE, to rule out prosthetic valve endocarditis  in view of his poor dentition.  He will also need outpatient colonoscopy to check colon cancer.  TEE Is scheduled for 09/11/2022.  ID recommended ROCEPHIN BID for a total of  14 days given arachnoiditis before switching to ceftriaxone 2g IV daily.  He will probably need atleast 6 weeks of antibiotics.   No fever or chills today.    Hx of CABG, AVR No chest pain or sob currently.  Anticoagulation on hold due to GI bleed.   Atrial fibrillation, chronic (HCC) Rate controlled. On metoprolol iv.  Anti coagulation on hold due to GI bleed.     OSA (obstructive sleep apnea) On CPAP   Chronic back pain Pain control and physical therapy.  Outpatient appointment with neurosurgery was rescheduled with Dr. Saintclair Halsted by wife    Hyperlipidemia Continue with Crestor 5 mg three times a week.      Occasional cough:  Cxr showing. Constellation of findings are favored to reflect pulmonary edema with bibasilar atelectasis and small bilateral pleural effusions. But he denies any sob and is      Pressure injury present on admission:  RN Pressure Injury Documentation: Pressure Injury 08/29/22 Vertebral column Lower Stage 2 -  Partial thickness loss of dermis presenting as a shallow open injury with a red, pink wound  bed without slough. two quarter sized pressure wounds over vertebra (Active)  08/29/22 1410  Location: Vertebral column  Location Orientation: Lower  Staging: Stage 2 -  Partial thickness loss of dermis presenting as a shallow open injury with a red, pink wound bed without slough.  Wound Description (Comments): two quarter sized pressure wounds over vertebra  Present on Admission: Yes  Dressing Type Foam - Lift dressing to assess site every shift 09/08/22 1544    Malnutrition Type:  Nutrition Problem: Moderate Malnutrition Etiology: chronic illness (vertebral osteomyelitis)   Malnutrition Characteristics:  Signs/Symptoms: moderate fat depletion, mild muscle depletion, energy intake < or equal to 50% for > or equal to 1 month, percent weight loss   Nutrition Interventions:  Interventions: Ensure Enlive (each supplement provides 350kcal and 20 grams  of protein), MVI  Estimated body mass index is 22.46 kg/m as calculated from the following:   Height as of this encounter: '5\' 10"'$  (1.778 m).   Weight as of this encounter: 71 kg.  Code Status: DNR.  DVT Prophylaxis:  SCDs Start: 08/29/22 2210   Level of Care: Level of care: Progressive Family Communication: none at bedside.   Disposition Plan:     Remains inpatient appropriate:  IV antibiotics.   Procedures:  TTE.  TEE scheduled for 09/11/22.  Consultants:   ID  Antimicrobials:   Anti-infectives (From admission, onward)    Start     Dose/Rate Route Frequency Ordered Stop   09/19/22 1000  cefTRIAXone (ROCEPHIN) 2 g in sodium chloride 0.9 % 100 mL IVPB        2 g 200 mL/hr over 30 Minutes Intravenous Every 24 hours 09/05/22 1003     09/05/22 1100  cefTRIAXone (ROCEPHIN) 2 g in sodium chloride 0.9 % 100 mL IVPB        2 g 200 mL/hr over 30 Minutes Intravenous Every 12 hours 09/05/22 1003 09/19/22 0959   09/02/22 1500  cefTRIAXone (ROCEPHIN) 2 g in sodium chloride 0.9 % 100 mL IVPB  Status:  Discontinued        2 g 200 mL/hr over 30 Minutes Intravenous Every 24 hours 09/02/22 1344 09/05/22 1003        Medications  Scheduled Meds:  amitriptyline  25 mg Oral QHS   brimonidine  1 drop Both Eyes BID   calcium-vitamin D  1 tablet Oral Daily   cholecalciferol  1,000 Units Oral Daily   cyanocobalamin  1,000 mcg Oral Daily   dorzolamide-timolol  1 drop Both Eyes BID   feeding supplement  237 mL Oral TID BM   gabapentin  300 mg Oral QID   latanoprost  1 drop Both Eyes QHS   lidocaine  1 patch Transdermal Q24H   multivitamin with minerals  1 tablet Oral Daily   pantoprazole  40 mg Oral BID AC   perphenazine  2 mg Oral Once per day on Mon Wed Fri   polyethylene glycol  17 g Oral Daily   prednisoLONE acetate  1 drop Right Eye Daily   psyllium  1 packet Oral Daily   rosuvastatin  5 mg Oral Q M,W,F   senna-docusate  1 tablet Oral QHS   sucralfate  1 g Oral BID    Continuous Infusions:  cefTRIAXone (ROCEPHIN)  IV 2 g (09/09/22 1000)   [START ON 09/19/2022] cefTRIAXone (ROCEPHIN)  IV     PRN Meds:.guaiFENesin-dextromethorphan, ipratropium-albuterol, metoprolol tartrate, mouth rinse, oxyCODONE, traMADol    Subjective:   Jason [name] was seen and examined today.  Cough has improved.   Objective:   Vitals:   09/08/22 1420 09/08/22 2054 09/09/22 0501 09/09/22 1430  BP:  120/72 112/64 107/75  Pulse:  92 92 87  Resp:  '18 14 14  '$ Temp: 98.6 F (37 C) 97.8 F (36.6 C) 97.8 F (36.6 C) 98.6 F (37 C)  TempSrc:  Oral Oral Oral  SpO2:  90% 94% 97%  Weight:      Height:        Intake/Output Summary (Last 24 hours) at 09/09/2022 1650 Last data filed at 09/09/2022 1626 Gross per 24 hour  Intake 540 ml  Output 3250 ml  Net -2710 ml    Filed Weights   08/29/22 1305 08/29/22 2044 09/02/22 0634  Weight: 77.1 kg 69.4 kg 71 kg     Exam General exam: Appears calm and comfortable  Respiratory system: diminished air entry at bases.  Cardiovascular system: S1 & S2 heard, RRR. No JVD, murmurs, Gastrointestinal system: Abdomen is nondistended, soft and nontender.  Central nervous system: Alert and oriented. Grossly non focal  Extremities: Symmetric 5 x 5 power. Skin: pressure injury on the back.  Psychiatry:  Mood & affect appropriate.      Data Reviewed:  I have personally reviewed following labs and imaging studies   CBC Lab Results  Component Value Date   WBC 10.3 09/09/2022   RBC 3.33 (L) 09/09/2022   HGB 9.1 (L) 09/09/2022   HCT 29.8 (L) 09/09/2022   MCV 89.5 09/09/2022   MCH 27.3 09/09/2022   PLT 210 09/09/2022   MCHC 30.5 09/09/2022   RDW 21.0 (H) 09/09/2022   LYMPHSABS 1.0 09/09/2022   MONOABS 1.1 (H) 09/09/2022   EOSABS 0.1 09/09/2022   BASOSABS 0.1 60/05/9322     Last metabolic panel Lab Results  Component Value Date   NA 133 (L) 09/08/2022   K 3.7 09/08/2022   CL 99 09/08/2022   CO2 27 09/08/2022   BUN  8 09/08/2022   CREATININE 0.69 09/08/2022   GLUCOSE 97 09/08/2022   GFRNONAA >60 09/08/2022   GFRAA >60 11/01/2016   CALCIUM 7.8 (L) 09/08/2022   PROT 5.9 (L) 09/06/2022   ALBUMIN 2.3 (L) 09/06/2022   LABGLOB 3.2 03/29/2016   BILITOT 0.2 (L) 09/06/2022   ALKPHOS 69 09/06/2022   AST 18 09/06/2022   ALT 12 09/06/2022   ANIONGAP 7 09/08/2022    CBG (last 3)  No results for input(s): "GLUCAP" in the last 72 hours.    Coagulation Profile: No results for input(s): "INR", "PROTIME" in the last 168 hours.   Radiology Studies: DG CHEST PORT 1 VIEW  Result Date: 09/09/2022 CLINICAL DATA:  10031 Cough 10031 EXAM: PORTABLE CHEST 1 VIEW COMPARISON:  August 29, 2022 FINDINGS: The cardiomediastinal silhouette is unchanged in contour.Status post median sternotomy and valve replacement. Small bilateral pleural effusions. No pneumothorax. Interstitial prominence and vascular congestion with bibasilar hazy opacities. Gaseous distension of bowel IMPRESSION: Constellation of findings are favored to reflect pulmonary edema with bibasilar atelectasis and small bilateral pleural effusions. Electronically Signed   By: Valentino Saxon M.D.   On: 09/09/2022 12:08       Hosie Poisson M.D. [facility] 09/09/2022, 4:50 PM  Available via Epic secure chat 7am-7pm After 7 pm, please refer to night coverage provider listed on amion.

## 2022-09-09 NOTE — Plan of Care (Signed)
  Problem: Clinical Measurements: Goal: Ability to maintain clinical measurements within normal limits will improve Outcome: Progressing Goal: Will remain free from infection Outcome: Progressing Goal: Diagnostic test results will improve Outcome: Progressing   Problem: Coping: Goal: Level of anxiety will decrease Outcome: Progressing   Problem: Safety: Goal: Ability to remain free from injury will improve Outcome: Progressing

## 2022-09-09 NOTE — Progress Notes (Signed)
    Shared Decision Making/Informed Consent  The risks [esophageal damage, perforation (1:10,000 risk), bleeding, pharyngeal hematoma as well as other potential complications associated with conscious sedation including aspiration, arrhythmia, respiratory failure and death], benefits (treatment guidance and diagnostic support) and alternatives of a transesophageal echocardiogram were discussed in detail with Jason [name] and he is willing to proceed.   Note patient tolerated recent small bowel endoscopy 09/05/3435 without complication, noted shelflike deformity in proximal esophagus, 6 cm hiatal hernia, nonbleeding gastric ulcer with a clean ulcer base found in fundus, a few fundic gland polyps, 3 nonbleeding angiodysplastic lesion in the stomach treated with APC, see full report for detail. Hgb recovered from 7 to 9 ranges. Labs ordered for 09/01/22.   NPO after midnight, TEE scheduled 09/11/2022, orders placed.

## 2022-09-10 DIAGNOSIS — A491 Streptococcal infection, unspecified site: Secondary | ICD-10-CM | POA: Diagnosis not present

## 2022-09-10 DIAGNOSIS — D62 Acute posthemorrhagic anemia: Secondary | ICD-10-CM | POA: Diagnosis not present

## 2022-09-10 DIAGNOSIS — I482 Chronic atrial fibrillation, unspecified: Secondary | ICD-10-CM | POA: Diagnosis not present

## 2022-09-10 DIAGNOSIS — K31811 Angiodysplasia of stomach and duodenum with bleeding: Secondary | ICD-10-CM | POA: Diagnosis not present

## 2022-09-10 LAB — CBC WITH DIFFERENTIAL/PLATELET
Abs Immature Granulocytes: 0.07 10*3/uL (ref 0.00–0.07)
Basophils Absolute: 0.1 10*3/uL (ref 0.0–0.1)
Basophils Relative: 1 %
Eosinophils Absolute: 0 10*3/uL (ref 0.0–0.5)
Eosinophils Relative: 0 %
HCT: 28.5 % — ABNORMAL LOW (ref 39.0–52.0)
Hemoglobin: 8.6 g/dL — ABNORMAL LOW (ref 13.0–17.0)
Immature Granulocytes: 1 %
Lymphocytes Relative: 10 %
Lymphs Abs: 1 10*3/uL (ref 0.7–4.0)
MCH: 27.1 pg (ref 26.0–34.0)
MCHC: 30.2 g/dL (ref 30.0–36.0)
MCV: 89.9 fL (ref 80.0–100.0)
Monocytes Absolute: 1.1 10*3/uL — ABNORMAL HIGH (ref 0.1–1.0)
Monocytes Relative: 12 %
Neutro Abs: 7.3 10*3/uL (ref 1.7–7.7)
Neutrophils Relative %: 76 %
Platelets: 216 10*3/uL (ref 150–400)
RBC: 3.17 MIL/uL — ABNORMAL LOW (ref 4.22–5.81)
RDW: 20.9 % — ABNORMAL HIGH (ref 11.5–15.5)
WBC: 9.6 10*3/uL (ref 4.0–10.5)
nRBC: 0 % (ref 0.0–0.2)

## 2022-09-10 MED ORDER — FUROSEMIDE 10 MG/ML IJ SOLN
20.0000 mg | Freq: Once | INTRAMUSCULAR | Status: AC
  Administered 2022-09-10: 20 mg via INTRAVENOUS
  Filled 2022-09-10: qty 2

## 2022-09-10 NOTE — Progress Notes (Signed)
Transport confirmed with [facility] for a 1030 pick - up. Pt scheduled for TEE at [facility].

## 2022-09-10 NOTE — Progress Notes (Signed)
[facility]                                                                               Jason [name], is a 89 y.o. male, DOB - [birth year], LPF:[id number] Admit date - 08/29/2022    Outpatient Primary MD for the patient is [name], MD  LOS - 12  days    Brief summary   89 year old gentleman prior history of coronary artery disease s/p CABG, AVR, chronic atrial fibrillation on anticoagulation with Eliquis, obstructive sleep apnea on CPAP, bladder carcinoma s/p TURB, prostate cancer, history of GI bleed with presence of gastric and small bowel AVMs on endoscopy, history of colon cancer s/p right hemicolectomy presents with generalized weakness and fatigue and was found to have a hemoglobin of 8.6 from baseline of 13 . GI consulted and he was found to have multiple AVM's in the stomach and duodenum , were ablated.  He is currently awaiting TEE scheduled on Monday.    Assessment & Plan    Assessment and Plan:   Acute blood loss anemia likely sec to GI bleed from AVM's GI consulted, SBE done showed mmultiple AVM'S in the stomach and duodenum AVM which were ablated.  Noted ulceration in the fundus of the stomach which was biopsied  Anemia panel showed iron 15, saturation 5 On IV iron supplements X 4 doses, plan to discharge on p.o. Holding Eliquis for now Continue Protonix 40 mg BID, start Carafate as per GI, with 1 gm BID.   No bloody bowel movements or melena.  Hemoglobin has improved to 9.1  to 8.6.    Strep infantarius bacteremia (bovis family) Likely from discitis vs vertebral OM .  Initial blood cultures growing strep, repeat cultures are pending.  MRI of the lumbar spine concerning for L1 L2 discitis and OM.  ID consulted and recommended TEE, to rule out prosthetic valve endocarditis  in view of his poor dentition.  He will also need outpatient colonoscopy to check colon cancer.  TEE Is scheduled for 09/11/2022.  ID recommended ROCEPHIN BID for a total  of 14 days given arachnoiditis before switching to ceftriaxone 2g IV daily.  He will probably need atleast 6 weeks of antibiotics.    No new complaints.    Hx of CABG, AVR No chest pain or sob currently.  Anticoagulation on hold due to GI bleed.   Atrial fibrillation, chronic (HCC) Rate controlled. On metoprolol iv. Hr BETWEEN 80 TO 110/MIN.  Anti coagulation on hold due to GI bleed.     OSA (obstructive sleep apnea) On CPAP   Chronic back pain Pain control and physical therapy.  Outpatient appointment with neurosurgery was rescheduled with Dr. Saintclair Halsted by wife    Hyperlipidemia Continue with crestor.      Occasional cough:  Cxr showing. Constellation of findings are favored to reflect pulmonary edema with bibasilar atelectasis and small bilateral pleural effusions. 1 dose of IV lasix 20 mg given.  Will check urine output.       Pressure injury present on admission:  RN Pressure Injury Documentation: Pressure Injury 08/29/22 Vertebral column Lower Stage 2 -  Partial thickness loss of dermis presenting as a shallow  open injury with a red, pink wound bed without slough. two quarter sized pressure wounds over vertebra (Active)  08/29/22 1410  Location: Vertebral column  Location Orientation: Lower  Staging: Stage 2 -  Partial thickness loss of dermis presenting as a shallow open injury with a red, pink wound bed without slough.  Wound Description (Comments): two quarter sized pressure wounds over vertebra  Present on Admission: Yes  Dressing Type Foam - Lift dressing to assess site every shift 09/09/22 2220    Malnutrition Type:  Nutrition Problem: Moderate Malnutrition Etiology: chronic illness (vertebral osteomyelitis)   Malnutrition Characteristics:  Signs/Symptoms: moderate fat depletion, mild muscle depletion, energy intake < or equal to 50% for > or equal to 1 month, percent weight loss   Nutrition Interventions:  Interventions: Ensure Enlive (each supplement  provides 350kcal and 20 grams of protein), MVI  Estimated body mass index is 22.46 kg/m as calculated from the following:   Height as of this encounter: '5\' 10"'$  (1.778 m).   Weight as of this encounter: 71 kg.  Code Status: DNR.  DVT Prophylaxis:  SCDs Start: 08/29/22 2210   Level of Care: Level of care: Progressive Family Communication: none at bedside.   Disposition Plan:     Remains inpatient appropriate:  IV antibiotics.   Procedures:  TTE.  TEE scheduled for 09/11/22.  Consultants:   ID Cardiology.  Antimicrobials:   Anti-infectives (From admission, onward)    Start     Dose/Rate Route Frequency Ordered Stop   09/19/22 1000  cefTRIAXone (ROCEPHIN) 2 g in sodium chloride 0.9 % 100 mL IVPB        2 g 200 mL/hr over 30 Minutes Intravenous Every 24 hours 09/05/22 1003     09/05/22 1100  cefTRIAXone (ROCEPHIN) 2 g in sodium chloride 0.9 % 100 mL IVPB        2 g 200 mL/hr over 30 Minutes Intravenous Every 12 hours 09/05/22 1003 09/19/22 0959   09/02/22 1500  cefTRIAXone (ROCEPHIN) 2 g in sodium chloride 0.9 % 100 mL IVPB  Status:  Discontinued        2 g 200 mL/hr over 30 Minutes Intravenous Every 24 hours 09/02/22 1344 09/05/22 1003        Medications  Scheduled Meds:  amitriptyline  25 mg Oral QHS   brimonidine  1 drop Both Eyes BID   calcium-vitamin D  1 tablet Oral Daily   cholecalciferol  1,000 Units Oral Daily   cyanocobalamin  1,000 mcg Oral Daily   dorzolamide-timolol  1 drop Both Eyes BID   feeding supplement  237 mL Oral TID BM   gabapentin  300 mg Oral QID   latanoprost  1 drop Both Eyes QHS   lidocaine  1 patch Transdermal Q24H   multivitamin with minerals  1 tablet Oral Daily   pantoprazole  40 mg Oral BID AC   perphenazine  2 mg Oral Once per day on Mon Wed Fri   polyethylene glycol  17 g Oral Daily   prednisoLONE acetate  1 drop Right Eye Daily   psyllium  1 packet Oral Daily   rosuvastatin  5 mg Oral Q M,W,F   senna-docusate  1 tablet Oral  QHS   sucralfate  1 g Oral BID   Continuous Infusions:  cefTRIAXone (ROCEPHIN)  IV 2 g (09/10/22 1039)   [START ON 09/19/2022] cefTRIAXone (ROCEPHIN)  IV     PRN Meds:.guaiFENesin-dextromethorphan, ipratropium-albuterol, metoprolol tartrate, mouth rinse, oxyCODONE, traMADol    Subjective:  Jason [name] was seen and examined today.   No new complaints as per the patient. He reports back pain improves with oxy.  No chest pain or sob. Cough is getting better.   Objective:   Vitals:   09/09/22 1430 09/09/22 2044 09/10/22 0600 09/10/22 1330  BP: 107/75 (!) 133/96 (!) 152/85 96/68  Pulse: 87 98 (!) 101 94  Resp: '14 18 18   '$ Temp: 98.6 F (37 C) 98 F (36.7 C) 97.8 F (36.6 C) 98.7 F (37.1 C)  TempSrc: Oral Oral Oral Oral  SpO2: 97% 94% 96% 94%  Weight:      Height:        Intake/Output Summary (Last 24 hours) at 09/10/2022 1759 Last data filed at 09/10/2022 1749 Gross per 24 hour  Intake 720 ml  Output 2251 ml  Net -1531 ml    Filed Weights   08/29/22 1305 08/29/22 2044 09/02/22 0634  Weight: 77.1 kg 69.4 kg 71 kg     Exam General exam: Appears calm and comfortable  Respiratory system: Clear to auscultation. Respiratory effort normal. Cardiovascular system: S1 & S2 heard, RRR. No JVD,  No pedal edema. Gastrointestinal system: Abdomen is nondistended, soft and nontender.  Central nervous system: Alert and oriented. No focal neurological deficits. Extremities: Symmetric 5 x 5 power. Skin: No rashes,  Psychiatry:  Mood & affect appropriate.       Data Reviewed:  I have personally reviewed following labs and imaging studies   CBC Lab Results  Component Value Date   WBC 9.6 09/10/2022   RBC 3.17 (L) 09/10/2022   HGB 8.6 (L) 09/10/2022   HCT 28.5 (L) 09/10/2022   MCV 89.9 09/10/2022   MCH 27.1 09/10/2022   PLT 216 09/10/2022   MCHC 30.2 09/10/2022   RDW 20.9 (H) 09/10/2022   LYMPHSABS 1.0 09/10/2022   MONOABS 1.1 (H) 09/10/2022   EOSABS 0.0  09/10/2022   BASOSABS 0.1 25/85/2778     Last metabolic panel Lab Results  Component Value Date   NA 133 (L) 09/08/2022   K 3.7 09/08/2022   CL 99 09/08/2022   CO2 27 09/08/2022   BUN 8 09/08/2022   CREATININE 0.69 09/08/2022   GLUCOSE 97 09/08/2022   GFRNONAA >60 09/08/2022   GFRAA >60 11/01/2016   CALCIUM 7.8 (L) 09/08/2022   PROT 5.9 (L) 09/06/2022   ALBUMIN 2.3 (L) 09/06/2022   LABGLOB 3.2 03/29/2016   BILITOT 0.2 (L) 09/06/2022   ALKPHOS 69 09/06/2022   AST 18 09/06/2022   ALT 12 09/06/2022   ANIONGAP 7 09/08/2022    CBG (last 3)  No results for input(s): "GLUCAP" in the last 72 hours.    Coagulation Profile: No results for input(s): "INR", "PROTIME" in the last 168 hours.   Radiology Studies: DG CHEST PORT 1 VIEW  Result Date: 09/09/2022 CLINICAL DATA:  10031 Cough 10031 EXAM: PORTABLE CHEST 1 VIEW COMPARISON:  August 29, 2022 FINDINGS: The cardiomediastinal silhouette is unchanged in contour.Status post median sternotomy and valve replacement. Small bilateral pleural effusions. No pneumothorax. Interstitial prominence and vascular congestion with bibasilar hazy opacities. Gaseous distension of bowel IMPRESSION: Constellation of findings are favored to reflect pulmonary edema with bibasilar atelectasis and small bilateral pleural effusions. Electronically Signed   By: Valentino Saxon M.D.   On: 09/09/2022 12:08       Hosie Poisson M.D. [facility] 09/10/2022, 5:59 PM  Available via Epic secure chat 7am-7pm After 7 pm, please refer to night coverage provider listed on amion.

## 2022-09-10 NOTE — H&P (View-Only) (Signed)
[facility]                                                                               Jason [name], is a 89 y.o. male, DOB - 02/13/1926, YJE:[id number] Admit date - 08/29/2022    Outpatient Primary MD for the patient is [name], MD  LOS - 12  days    Brief summary   89 year old gentleman prior history of coronary artery disease s/p CABG, AVR, chronic atrial fibrillation on anticoagulation with Eliquis, obstructive sleep apnea on CPAP, bladder carcinoma s/p TURB, prostate cancer, history of GI bleed with presence of gastric and small bowel AVMs on endoscopy, history of colon cancer s/p right hemicolectomy presents with generalized weakness and fatigue and was found to have a hemoglobin of 8.6 from baseline of 13 . GI consulted and he was found to have multiple AVM's in the stomach and duodenum , were ablated.  He is currently awaiting TEE scheduled on Monday.    Assessment & Plan    Assessment and Plan:   Acute blood loss anemia likely sec to GI bleed from AVM's GI consulted, SBE done showed mmultiple AVM'S in the stomach and duodenum AVM which were ablated.  Noted ulceration in the fundus of the stomach which was biopsied  Anemia panel showed iron 15, saturation 5 On IV iron supplements X 4 doses, plan to discharge on p.o. Holding Eliquis for now Continue Protonix 40 mg BID, start Carafate as per GI, with 1 gm BID.   No bloody bowel movements or melena.  Hemoglobin has improved to 9.1  to 8.6.    Strep infantarius bacteremia (bovis family) Likely from discitis vs vertebral OM .  Initial blood cultures growing strep, repeat cultures are pending.  MRI of the lumbar spine concerning for L1 L2 discitis and OM.  ID consulted and recommended TEE, to rule out prosthetic valve endocarditis  in view of his poor dentition.  He will also need outpatient colonoscopy to check colon cancer.  TEE Is scheduled for 09/11/2022.  ID recommended ROCEPHIN BID for a total  of 14 days given arachnoiditis before switching to ceftriaxone 2g IV daily.  He will probably need atleast 6 weeks of antibiotics.    No new complaints.    Hx of CABG, AVR No chest pain or sob currently.  Anticoagulation on hold due to GI bleed.   Atrial fibrillation, chronic (HCC) Rate controlled. On metoprolol iv. Hr BETWEEN 80 TO 110/MIN.  Anti coagulation on hold due to GI bleed.     OSA (obstructive sleep apnea) On CPAP   Chronic back pain Pain control and physical therapy.  Outpatient appointment with neurosurgery was rescheduled with Dr. Saintclair Halsted by wife    Hyperlipidemia Continue with crestor.      Occasional cough:  Cxr showing. Constellation of findings are favored to reflect pulmonary edema with bibasilar atelectasis and small bilateral pleural effusions. 1 dose of IV lasix 20 mg given.  Will check urine output.       Pressure injury present on admission:  RN Pressure Injury Documentation: Pressure Injury 08/29/22 Vertebral column Lower Stage 2 -  Partial thickness loss of dermis presenting as a shallow  open injury with a red, pink wound bed without slough. two quarter sized pressure wounds over vertebra (Active)  08/29/22 1410  Location: Vertebral column  Location Orientation: Lower  Staging: Stage 2 -  Partial thickness loss of dermis presenting as a shallow open injury with a red, pink wound bed without slough.  Wound Description (Comments): two quarter sized pressure wounds over vertebra  Present on Admission: Yes  Dressing Type Foam - Lift dressing to assess site every shift 09/09/22 2220    Malnutrition Type:  Nutrition Problem: Moderate Malnutrition Etiology: chronic illness (vertebral osteomyelitis)   Malnutrition Characteristics:  Signs/Symptoms: moderate fat depletion, mild muscle depletion, energy intake < or equal to 50% for > or equal to 1 month, percent weight loss   Nutrition Interventions:  Interventions: Ensure Enlive (each supplement  provides 350kcal and 20 grams of protein), MVI  Estimated body mass index is 22.46 kg/m as calculated from the following:   Height as of this encounter: '5\' 10"'$  (1.778 m).   Weight as of this encounter: 71 kg.  Code Status: DNR.  DVT Prophylaxis:  SCDs Start: 08/29/22 2210   Level of Care: Level of care: Progressive Family Communication: none at bedside.   Disposition Plan:     Remains inpatient appropriate:  IV antibiotics.   Procedures:  TTE.  TEE scheduled for 09/11/22.  Consultants:   ID Cardiology.  Antimicrobials:   Anti-infectives (From admission, onward)    Start     Dose/Rate Route Frequency Ordered Stop   09/19/22 1000  cefTRIAXone (ROCEPHIN) 2 g in sodium chloride 0.9 % 100 mL IVPB        2 g 200 mL/hr over 30 Minutes Intravenous Every 24 hours 09/05/22 1003     09/05/22 1100  cefTRIAXone (ROCEPHIN) 2 g in sodium chloride 0.9 % 100 mL IVPB        2 g 200 mL/hr over 30 Minutes Intravenous Every 12 hours 09/05/22 1003 09/19/22 0959   09/02/22 1500  cefTRIAXone (ROCEPHIN) 2 g in sodium chloride 0.9 % 100 mL IVPB  Status:  Discontinued        2 g 200 mL/hr over 30 Minutes Intravenous Every 24 hours 09/02/22 1344 09/05/22 1003        Medications  Scheduled Meds:  amitriptyline  25 mg Oral QHS   brimonidine  1 drop Both Eyes BID   calcium-vitamin D  1 tablet Oral Daily   cholecalciferol  1,000 Units Oral Daily   cyanocobalamin  1,000 mcg Oral Daily   dorzolamide-timolol  1 drop Both Eyes BID   feeding supplement  237 mL Oral TID BM   gabapentin  300 mg Oral QID   latanoprost  1 drop Both Eyes QHS   lidocaine  1 patch Transdermal Q24H   multivitamin with minerals  1 tablet Oral Daily   pantoprazole  40 mg Oral BID AC   perphenazine  2 mg Oral Once per day on Mon Wed Fri   polyethylene glycol  17 g Oral Daily   prednisoLONE acetate  1 drop Right Eye Daily   psyllium  1 packet Oral Daily   rosuvastatin  5 mg Oral Q M,W,F   senna-docusate  1 tablet Oral  QHS   sucralfate  1 g Oral BID   Continuous Infusions:  cefTRIAXone (ROCEPHIN)  IV 2 g (09/10/22 1039)   [START ON 09/19/2022] cefTRIAXone (ROCEPHIN)  IV     PRN Meds:.guaiFENesin-dextromethorphan, ipratropium-albuterol, metoprolol tartrate, mouth rinse, oxyCODONE, traMADol    Subjective:  Deontae Robson was seen and examined today.   No new complaints as per the patient. He reports back pain improves with oxy.  No chest pain or sob. Cough is getting better.   Objective:   Vitals:   09/09/22 1430 09/09/22 2044 09/10/22 0600 09/10/22 1330  BP: 107/75 (!) 133/96 (!) 152/85 96/68  Pulse: 87 98 (!) 101 94  Resp: '14 18 18   '$ Temp: 98.6 F (37 C) 98 F (36.7 C) 97.8 F (36.6 C) 98.7 F (37.1 C)  TempSrc: Oral Oral Oral Oral  SpO2: 97% 94% 96% 94%  Weight:      Height:        Intake/Output Summary (Last 24 hours) at 09/10/2022 1759 Last data filed at 09/10/2022 1749 Gross per 24 hour  Intake 720 ml  Output 2251 ml  Net -1531 ml    Filed Weights   08/29/22 1305 08/29/22 2044 09/02/22 0634  Weight: 77.1 kg 69.4 kg 71 kg     Exam General exam: Appears calm and comfortable  Respiratory system: Clear to auscultation. Respiratory effort normal. Cardiovascular system: S1 & S2 heard, RRR. No JVD,  No pedal edema. Gastrointestinal system: Abdomen is nondistended, soft and nontender.  Central nervous system: Alert and oriented. No focal neurological deficits. Extremities: Symmetric 5 x 5 power. Skin: No rashes,  Psychiatry:  Mood & affect appropriate.       Data Reviewed:  I have personally reviewed following labs and imaging studies   CBC Lab Results  Component Value Date   WBC 9.6 09/10/2022   RBC 3.17 (L) 09/10/2022   HGB 8.6 (L) 09/10/2022   HCT 28.5 (L) 09/10/2022   MCV 89.9 09/10/2022   MCH 27.1 09/10/2022   PLT 216 09/10/2022   MCHC 30.2 09/10/2022   RDW 20.9 (H) 09/10/2022   LYMPHSABS 1.0 09/10/2022   MONOABS 1.1 (H) 09/10/2022   EOSABS 0.0  09/10/2022   BASOSABS 0.1 12/87/8676     Last metabolic panel Lab Results  Component Value Date   NA 133 (L) 09/08/2022   K 3.7 09/08/2022   CL 99 09/08/2022   CO2 27 09/08/2022   BUN 8 09/08/2022   CREATININE 0.69 09/08/2022   GLUCOSE 97 09/08/2022   GFRNONAA >60 09/08/2022   GFRAA >60 11/01/2016   CALCIUM 7.8 (L) 09/08/2022   PROT 5.9 (L) 09/06/2022   ALBUMIN 2.3 (L) 09/06/2022   LABGLOB 3.2 03/29/2016   BILITOT 0.2 (L) 09/06/2022   ALKPHOS 69 09/06/2022   AST 18 09/06/2022   ALT 12 09/06/2022   ANIONGAP 7 09/08/2022    CBG (last 3)  No results for input(s): "GLUCAP" in the last 72 hours.    Coagulation Profile: No results for input(s): "INR", "PROTIME" in the last 168 hours.   Radiology Studies: DG CHEST PORT 1 VIEW  Result Date: 09/09/2022 CLINICAL DATA:  10031 Cough 10031 EXAM: PORTABLE CHEST 1 VIEW COMPARISON:  August 29, 2022 FINDINGS: The cardiomediastinal silhouette is unchanged in contour.Status post median sternotomy and valve replacement. Small bilateral pleural effusions. No pneumothorax. Interstitial prominence and vascular congestion with bibasilar hazy opacities. Gaseous distension of bowel IMPRESSION: Constellation of findings are favored to reflect pulmonary edema with bibasilar atelectasis and small bilateral pleural effusions. Electronically Signed   By: Valentino Saxon M.D.   On: 09/09/2022 12:08       Hosie Poisson M.D. [facility] 09/10/2022, 5:59 PM  Available via Epic secure chat 7am-7pm After 7 pm, please refer to night coverage provider listed on amion.

## 2022-09-10 NOTE — Plan of Care (Signed)

## 2022-09-11 ENCOUNTER — Inpatient Hospital Stay: Payer: Self-pay

## 2022-09-11 ENCOUNTER — Inpatient Hospital Stay (HOSPITAL_COMMUNITY): Payer: Medicare Other | Admitting: Anesthesiology

## 2022-09-11 ENCOUNTER — Encounter (HOSPITAL_COMMUNITY): Payer: Self-pay | Admitting: Internal Medicine

## 2022-09-11 ENCOUNTER — Encounter (HOSPITAL_COMMUNITY)
Admission: EM | Disposition: A | Discharge status: Skilled Nursing Facility | Payer: Self-pay | Source: Other Acute Inpatient Hospital | Attending: Internal Medicine

## 2022-09-11 ENCOUNTER — Inpatient Hospital Stay (HOSPITAL_COMMUNITY): Payer: Medicare Other

## 2022-09-11 ENCOUNTER — Ambulatory Visit: Payer: Medicare Other | Admitting: Cardiology

## 2022-09-11 DIAGNOSIS — K31819 Angiodysplasia of stomach and duodenum without bleeding: Secondary | ICD-10-CM | POA: Diagnosis not present

## 2022-09-11 DIAGNOSIS — I330 Acute and subacute infective endocarditis: Secondary | ICD-10-CM | POA: Diagnosis not present

## 2022-09-11 DIAGNOSIS — I2510 Atherosclerotic heart disease of native coronary artery without angina pectoris: Secondary | ICD-10-CM | POA: Diagnosis not present

## 2022-09-11 DIAGNOSIS — K31811 Angiodysplasia of stomach and duodenum with bleeding: Secondary | ICD-10-CM | POA: Diagnosis not present

## 2022-09-11 DIAGNOSIS — F418 Other specified anxiety disorders: Secondary | ICD-10-CM

## 2022-09-11 DIAGNOSIS — Z87891 Personal history of nicotine dependence: Secondary | ICD-10-CM | POA: Diagnosis not present

## 2022-09-11 DIAGNOSIS — A491 Streptococcal infection, unspecified site: Secondary | ICD-10-CM | POA: Diagnosis not present

## 2022-09-11 DIAGNOSIS — R7881 Bacteremia: Secondary | ICD-10-CM

## 2022-09-11 HISTORY — PX: TEE WITHOUT CARDIOVERSION: SHX5443

## 2022-09-11 LAB — CBC WITH DIFFERENTIAL/PLATELET
Abs Immature Granulocytes: 0.07 10*3/uL (ref 0.00–0.07)
Basophils Absolute: 0.1 10*3/uL (ref 0.0–0.1)
Basophils Relative: 1 %
Eosinophils Absolute: 0 10*3/uL (ref 0.0–0.5)
Eosinophils Relative: 0 %
HCT: 27.3 % — ABNORMAL LOW (ref 39.0–52.0)
Hemoglobin: 8.4 g/dL — ABNORMAL LOW (ref 13.0–17.0)
Immature Granulocytes: 1 %
Lymphocytes Relative: 12 %
Lymphs Abs: 1.2 10*3/uL (ref 0.7–4.0)
MCH: 27.7 pg (ref 26.0–34.0)
MCHC: 30.8 g/dL (ref 30.0–36.0)
MCV: 90.1 fL (ref 80.0–100.0)
Monocytes Absolute: 1.2 10*3/uL — ABNORMAL HIGH (ref 0.1–1.0)
Monocytes Relative: 12 %
Neutro Abs: 7.1 10*3/uL (ref 1.7–7.7)
Neutrophils Relative %: 74 %
Platelets: 206 10*3/uL (ref 150–400)
RBC: 3.03 MIL/uL — ABNORMAL LOW (ref 4.22–5.81)
RDW: 20.7 % — ABNORMAL HIGH (ref 11.5–15.5)
WBC: 9.7 10*3/uL (ref 4.0–10.5)
nRBC: 0 % (ref 0.0–0.2)

## 2022-09-11 LAB — BASIC METABOLIC PANEL
Anion gap: 9 (ref 5–15)
BUN: 12 mg/dL (ref 8–23)
CO2: 28 mmol/L (ref 22–32)
Calcium: 8 mg/dL — ABNORMAL LOW (ref 8.9–10.3)
Chloride: 96 mmol/L — ABNORMAL LOW (ref 98–111)
Creatinine, Ser: 0.84 mg/dL (ref 0.61–1.24)
GFR, Estimated: >60 mL/min (ref >60)
Glucose, Bld: 107 mg/dL — ABNORMAL HIGH (ref 70–99)
Potassium: 3.8 mmol/L (ref 3.5–5.1)
Sodium: 133 mmol/L — ABNORMAL LOW (ref 135–145)

## 2022-09-11 LAB — ECHO TEE

## 2022-09-11 SURGERY — ECHOCARDIOGRAM, TRANSESOPHAGEAL
Anesthesia: Monitor Anesthesia Care

## 2022-09-11 MED ORDER — PROPOFOL 500 MG/50ML IV EMUL
INTRAVENOUS | Status: DC | PRN
  Administered 2022-09-11: 100 ug/kg/min via INTRAVENOUS

## 2022-09-11 MED ORDER — SODIUM CHLORIDE 0.9 % IV SOLN: INTRAVENOUS | Status: DC

## 2022-09-11 MED ORDER — PHENYLEPHRINE 80 MCG/ML (10ML) SYRINGE FOR IV PUSH (FOR BLOOD PRESSURE SUPPORT)
PREFILLED_SYRINGE | INTRAVENOUS | Status: DC | PRN
  Administered 2022-09-11: 80 ug via INTRAVENOUS
  Administered 2022-09-11: 160 ug via INTRAVENOUS

## 2022-09-11 MED ORDER — PROPOFOL 10 MG/ML IV BOLUS
INTRAVENOUS | Status: DC | PRN
  Administered 2022-09-11 (×2): 20 mg via INTRAVENOUS

## 2022-09-11 NOTE — Progress Notes (Signed)
PT Cancellation Note  Patient Details Name: Jason [name] MRN: [id number] DOB: Jun 10, 1926   Cancelled Treatment:    Reason Eval/Treat Not Completed: Patient at procedure or test/unavailable   Kati L Payson 09/11/2022, 11:17 AM Arlyce Dice, DPT Physical Therapist [facility] Preferred contact method: Secure Chat Weekend Pager Only: [phone] Office: [phone]

## 2022-09-11 NOTE — Anesthesia Postprocedure Evaluation (Signed)
Anesthesia Post Note  Patient: Jason [name]  Procedure(s) Performed: TRANSESOPHAGEAL ECHOCARDIOGRAM (TEE)     Patient location during evaluation: PACU Anesthesia Type: MAC Level of consciousness: awake and alert Pain management: pain level controlled Vital Signs Assessment: post-procedure vital signs reviewed and stable Respiratory status: spontaneous breathing, nonlabored ventilation and respiratory function stable Cardiovascular status: stable and blood pressure returned to baseline Postop Assessment: no apparent nausea or vomiting Anesthetic complications: no  No notable events documented.  Last Vitals:  Vitals:   09/11/22 1210 09/11/22 1220  BP: 98/62 121/77  Pulse: 85   Resp: 13   Temp:    SpO2: 93%     Last Pain:  Vitals:   09/11/22 1220  TempSrc:   PainSc: 0-No pain                 Anastacia Reinecke,W. EDMOND

## 2022-09-11 NOTE — Plan of Care (Signed)
  Problem: Clinical Measurements: ?Goal: Respiratory complications will improve ?Outcome: Progressing ?Goal: Cardiovascular complication will be avoided ?Outcome: Progressing ?  ?Problem: Safety: ?Goal: Ability to remain free from injury will improve ?Outcome: Progressing ?  ?

## 2022-09-11 NOTE — Transfer of Care (Signed)
Immediate Anesthesia Transfer of Care Note  Patient: Jason [name]  Procedure(s) Performed: TRANSESOPHAGEAL ECHOCARDIOGRAM (TEE)  Patient Location: Endoscopy Unit  Anesthesia Type:MAC  Level of Consciousness: drowsy  Airway & Oxygen Therapy: Patient Spontanous Breathing  Post-op Assessment: Report given to RN and Post -op Vital signs reviewed and stable  Post vital signs: Reviewed and stable  Last Vitals:  Vitals Value Taken Time  BP    Temp    Pulse 86 09/11/22 1159  Resp 11 09/11/22 1159  SpO2 94 % 09/11/22 1159  Vitals shown include unvalidated device data.  Last Pain:  Vitals:   09/11/22 1050  TempSrc: Temporal  PainSc: 0-No pain      Patients Stated Pain Goal: 2 (37/94/32 7614)  Complications: No notable events documented.

## 2022-09-11 NOTE — Progress Notes (Signed)
Pt resting comfortably, denies pain.  Vs wnl.  [facility] has been called and waiting on transport.

## 2022-09-11 NOTE — Anesthesia Procedure Notes (Signed)
Procedure Name: MAC Date/Time: 09/11/2022 11:27 AM  Performed by: Minerva Ends, CRNAPre-anesthesia Checklist: Patient identified, Emergency Drugs available, Suction available, Patient being monitored and Timeout performed Patient Re-evaluated:Patient Re-evaluated prior to induction Oxygen Delivery Method: Nasal cannula Placement Confirmation: positive ETCO2 Dental Injury: Teeth and Oropharynx as per pre-operative assessment

## 2022-09-11 NOTE — Progress Notes (Addendum)
Daily Progress Note  Hospital Day: 19  Chief Complaint: Heme positive anemia / strep infantarius ( bovis) bacteremia  Assessment   Brief Narrative:  Jason [name] is a 89 y.o. male with a pmh of coronary artery disease s/p CABG, AVR, chronic atrial fibrillation on anticoagulation with Eliquis, obstructive sleep apnea on CPAP, bladder carcinoma s/p TURB, prostate cancer, gastric and small bowel AVMs , history of colon cancer s/p right hemicolectomy. Patient admitted a couple of weeks ago with symptomatic anemia / FOBT+ on Eliquis. We saw him 08/30/22 in consultation.    We have been asked to address when to restart Eliquis. Also has strep bovis bacteremia.   # Acute on chronic anemia / FOBT + on Xarelto / Small bowel AVMS / non-bleeding gastric ulcers on endoscopy.  Small bowel endoscopy this admission with findings of a 6 cm HH, non-bleeding gastric ulcers,  3 non-bleeding angiodysplastic lesions in stomach treated with APC, gastritis, a non-bleeding angiodysplastic lesion in D3 treated with APC. See full report below Hgb was in 7-8 range, down from baseline 13. Hasn't been transfused blood but received 2 doses IV iron  Hgb stable at 8.4  # History of colon cancer in 2018 s/p right hemicolectomy. A follow up colonoscopy in July 2019 with excellent prep was remarkable for one small tubular adenoma. Based on age, routine surveillance colonoscopy wasn't recommended.   # Strep infantarius ( bovis). Will be on prolonged course of antibiotics. ID has evaluated. He underwent TEE today. Bovis cCan be associated with colon cancer. He has a history of colon cancer.   # Possible vegetative / infective endocarditis on today's TEE  # AFib. Eliquis has been on hold  Plan:    Should have colonoscopy when medically stable. Right now Eliquis is still on hold. Will discuss with Dr. Carlean Purl. If no plans to proceed with inpatient colonoscopy then okay to resume Eliquis from GI  standpoint -----------------------------------------------------------------------------------------------------------------  Gastroenterology attending:   I have also seen and evaluated this patient in person.  I agree with the recommendations and plan as above.    He could resume Eliquis if desired.  However given the Strep infantarius (bovis) infection, it is a standard of care to do a colonoscopy as that is associated with colon cancer and he could have a second primary.  He is too sleepy to have a good discussion about this right now.  We will follow-up again tomorrow and sort this out.  Gatha Mayer, MD, [facility] See Shea Evans on call - gastroenterology for best contact person 09/11/2022 5:28 PM    Subjective  Sleepy. Recently back from TEE. He has no problems at home with bowel movements. Legally blind so doesn't know if he has had any bleeding.   Objective   Endoscopic studies:  08/31/21 small bowel endoscopy     Imaging:  [place] EKG SITE RITE  Result Date: 09/11/2022 If Site Rite image not attached, placement could not be confirmed due to current cardiac rhythm.  ECHO TEE  Result Date: 09/11/2022    TRANSESOPHOGEAL ECHO REPORT   Patient Name:   Jason [name] [facility] Date of Exam: 09/11/2022 Medical Rec #:  [id number]         Height:       70.0 in Accession #:    [id number]        Weight:       156.5 lb Date of Birth:  18-Dec-1925         BSA:  1.881 m Patient Age:    89 years          BP:           118/73 mmHg Patient Gender: M                 HR:           93 bpm. Exam Location:  Inpatient Procedure: Transesophageal Echo, Color Doppler and 3D Echo Indications:     Bacteremia  History:         Patient has prior history of Echocardiogram examinations, most                  recent 09/04/2022. CAD, Prior CABG, Arrythmias:Atrial                  Fibrillation; Risk Factors:Sleep Apnea and Dyslipidemia. Strep                  infantarius bacteremia.                   Aortic Valve: 23 mm Magna porcine valve valve is present in the                  aortic position. Procedure Date: 05/2020.  Sonographer:     Darlina Sicilian RDCS Referring Phys:  [id number] Margie Billet Diagnosing Phys: [place] PROCEDURE: After discussion of the risks and benefits of a TEE, an informed consent was obtained from the patient. TEE procedure time was 12 minutes. The transesophogeal probe was passed without difficulty through the esophogus of the patient. Imaged were obtained with the patient in a left lateral decubitus position. Sedation performed by different physician. The patient was monitored while under deep sedation. Anesthestetic sedation was provided intravenously by Anesthesiology: 144.'73mg'$  of Propofol. Image quality was good. The patient's vital signs; including heart rate, blood pressure, and oxygen saturation; remained stable throughout the procedure. The patient developed no complications during the procedure.  IMPRESSIONS  1. Left ventricular ejection fraction, by estimation, is 60 to 65%. The left ventricle has normal function.  2. Right ventricular systolic function is normal. The right ventricular size is normal.  3. No left atrial/left atrial appendage thrombus was detected.  4. Trivial mitral valve regurgitation.  5. There is mild thickening (0.4 x 0.4 cm) of the [place] suggest of iinfective endocarditis. No PVL. Aortic valve regurgitation is not visualized. There is a 23 mm Magna porcine valve valve present in the aortic position. Procedure Date: 05/2020.  6. There is Moderate (Grade III) plaque. Conclusion(s)/Recommendation(s): Findings are concerning for vegetation/infective endocarditis as detailed above. FINDINGS  Left Ventricle: Left ventricular ejection fraction, by estimation, is 60 to 65%. The left ventricle has normal function. The left ventricular internal cavity size was normal in size. Right Ventricle: The right ventricular size is normal. Right ventricular systolic function is  normal. Left Atrium: No left atrial/left atrial appendage thrombus was detected. Pericardium: There is no evidence of pericardial effusion. Mitral Valve: Trivial mitral valve regurgitation. Tricuspid Valve: The tricuspid valve is normal in structure. Tricuspid valve regurgitation is trivial. Aortic Valve: There is mild thickening (0.4 x 0.4 cm) of the [place] suggest of iinfective endocarditis. No PVL. Aortic valve regurgitation is not visualized. There is a 23 mm Magna porcine valve valve present in the aortic position. Procedure Date: 05/2020. Pulmonic Valve: Pulmonic valve regurgitation is mild. Aorta: There is moderate (Grade III) plaque. IAS/Shunts: No atrial level shunt detected by color flow Doppler. [facility] Electronically  signed by Phineas Inches Signature Date/Time: 09/11/2022/12:50:54 PM    Final    DG CHEST PORT 1 VIEW  Result Date: 09/09/2022 CLINICAL DATA:  10031 Cough 10031 EXAM: PORTABLE CHEST 1 VIEW COMPARISON:  August 29, 2022 FINDINGS: The cardiomediastinal silhouette is unchanged in contour.Status post median sternotomy and valve replacement. Small bilateral pleural effusions. No pneumothorax. Interstitial prominence and vascular congestion with bibasilar hazy opacities. Gaseous distension of bowel IMPRESSION: Constellation of findings are favored to reflect pulmonary edema with bibasilar atelectasis and small bilateral pleural effusions. Electronically Signed   By: Valentino Saxon M.D.   On: 09/09/2022 12:08    Lab Results: Recent Labs    09/09/22 0705 09/10/22 0543 09/11/22 0602  WBC 10.3 9.6 9.7  HGB 9.1* 8.6* 8.4*  HCT 29.8* 28.5* 27.3*  PLT 210 216 206   BMET Recent Labs    09/11/22 1411  NA 133*  K 3.8  CL 96*  CO2 28  GLUCOSE 107*  BUN 12  CREATININE 0.84  CALCIUM 8.0*   LFT No results for input(s): "PROT", "ALBUMIN", "AST", "ALT", "ALKPHOS", "BILITOT", "BILIDIR", "IBILI" in the last 72 hours. PT/INR No results for input(s): "LABPROT", "INR" in the last 72  hours.   Scheduled inpatient medications:   amitriptyline  25 mg Oral QHS   brimonidine  1 drop Both Eyes BID   calcium-vitamin D  1 tablet Oral Daily   cholecalciferol  1,000 Units Oral Daily   cyanocobalamin  1,000 mcg Oral Daily   dorzolamide-timolol  1 drop Both Eyes BID   feeding supplement  237 mL Oral TID BM   gabapentin  300 mg Oral QID   latanoprost  1 drop Both Eyes QHS   lidocaine  1 patch Transdermal Q24H   multivitamin with minerals  1 tablet Oral Daily   pantoprazole  40 mg Oral BID AC   perphenazine  2 mg Oral Once per day on Mon Wed Fri   polyethylene glycol  17 g Oral Daily   prednisoLONE acetate  1 drop Right Eye Daily   psyllium  1 packet Oral Daily   rosuvastatin  5 mg Oral Q M,W,F   senna-docusate  1 tablet Oral QHS   sucralfate  1 g Oral BID   Continuous inpatient infusions:   cefTRIAXone (ROCEPHIN)  IV 2 g (09/11/22 0925)   [START ON 09/19/2022] cefTRIAXone (ROCEPHIN)  IV     PRN inpatient medications: guaiFENesin-dextromethorphan, ipratropium-albuterol, metoprolol tartrate, mouth rinse, oxyCODONE, traMADol  Vital signs in last 24 hours: Temp:  [97.5 F (36.4 C)-98.9 F (37.2 C)] 98.3 F (36.8 C) (01/15 1404) Pulse Rate:  [84-99] 97 (01/15 1404) Resp:  [11-18] 14 (01/15 1404) BP: (90-128)/(58-111) 126/83 (01/15 1404) SpO2:  [88 %-96 %] 96 % (01/15 1404) Last BM Date : 09/10/22  Intake/Output Summary (Last 24 hours) at 09/11/2022 1546 Last data filed at 09/11/2022 1155 Gross per 24 hour  Intake 390 ml  Output 1600 ml  Net -1210 ml    Intake/Output from previous day: 01/14 0701 - 01/15 0700 In: 360 [P.O.:360] Out: 1801 [Urine:1800; Stool:1] Intake/Output this shift: Total I/O In: 150 [P.O.:50; I.V.:100] Out: 200 [Urine:200]   Physical Exam:  General: sleepy male in NAD Heart:  Regular rate and rhythm.  Pulmonary: Normal respiratory effort Abdomen: Soft, nondistended, nontender. Normal bowel sounds. Extremities: No lower extremity edema   Psych:  Cooperative.    Principal Problem:   Streptococcus bovis infection Active Problems:   OSA (obstructive sleep apnea)   Atrial fibrillation, chronic (  [place])   Gastric AVM   Anemia   Anemia associated with acute blood loss   GI bleed   Hx of CABG   Hypotension   Heme positive stool   AVM (arteriovenous malformation) of small bowel, acquired   History of iron deficiency   S/P AVR   Prosthetic valve endocarditis (HCC)   Diskitis   Vertebral osteomyelitis ([place])   Malnutrition of moderate degree     LOS: 13 days   Tye Savoy ,NP 09/11/2022, 3:46 PM

## 2022-09-11 NOTE — Anesthesia Preprocedure Evaluation (Addendum)
Anesthesia Evaluation  Patient identified by MRN, date of birth, ID band Patient awake    Reviewed: Allergy & Precautions, H&P , NPO status , Patient's Chart, lab work & pertinent test results, reviewed documented beta blocker date and time   Airway Mallampati: II  TM Distance: >3 FB Neck ROM: Full    Dental no notable dental hx. (+) Teeth Intact, Dental Advisory Given   Pulmonary sleep apnea , former smoker   Pulmonary exam normal breath sounds clear to auscultation       Cardiovascular + CAD   Rhythm:Regular Rate:Normal     Neuro/Psych   Anxiety Depression    CVA    GI/Hepatic Neg liver ROS,GERD  Medicated,,  Endo/Other  negative endocrine ROS    Renal/GU negative Renal ROS  negative genitourinary   Musculoskeletal  (+) Arthritis , Osteoarthritis,    Abdominal   Peds  Hematology  (+) Blood dyscrasia, anemia   Anesthesia Other Findings   Reproductive/Obstetrics negative OB ROS                             Anesthesia Physical Anesthesia Plan  ASA: 3  Anesthesia Plan: MAC   Post-op Pain Management: Minimal or no pain anticipated   Induction: Intravenous  PONV Risk Score and Plan: 2 and Propofol infusion and Treatment may vary due to age or medical condition  Airway Management Planned: Natural Airway and Nasal Cannula  Additional Equipment:   Intra-op Plan:   Post-operative Plan:   Informed Consent: I have reviewed the patients History and Physical, chart, labs and discussed the procedure including the risks, benefits and alternatives for the proposed anesthesia with the patient or authorized representative who has indicated his/her understanding and acceptance.   Patient has DNR.  Discussed DNR with patient and Continue DNR.   Dental advisory given  Plan Discussed with: CRNA  Anesthesia Plan Comments:        Anesthesia Quick Evaluation

## 2022-09-11 NOTE — Progress Notes (Addendum)
Called by [facility] about when Eliquis could be resumed. Chart reviewed. He has been off Eliquis several days. It is fine to resume Eliquis ( see endoscopy note)   Patient has multiple medical comorbidities and multiple cancers and on anticoagulation whom the GI service was asked to evaluate for heme positive anemia in the setting of previous AVMs of the upper GI tract as well as history of colon cancer within the last 6 years.   He has not required blood transfusion the admission. Got 2 doses of IV iron.   Small bowel endoscopy done this admission on 08/31/22.        GI ATTENDING  I see that he has Strep infantarius (aka bovis) infection so to be complete a colonoscopy to exclude a second colon cancer 9new one) is appropriate.  SO WOULD HOLD OFF ON RESTARTING ELIQUIS WHILE WE COME BACK AND DECIDE ABOUT TIMING OF POSSIBLE COLONOSCOPY   Gatha Mayer, MD, [facility] Gastroenterology See Shea Evans on call - gastroenterology for best contact person 09/11/2022 2:13 PM

## 2022-09-11 NOTE — Progress Notes (Signed)
[facility] arrived to transport pt back to [facility]

## 2022-09-11 NOTE — Progress Notes (Signed)
  Echocardiogram Echocardiogram Transesophageal has been performed.  Darlina Sicilian M 09/11/2022, 12:08 PM

## 2022-09-11 NOTE — Progress Notes (Addendum)
[facility] for Infectious Disease  Date of Admission:  08/29/2022   Total days of inpatient antibiotics 10  Principal Problem:   Streptococcus bovis infection Active Problems:   OSA (obstructive sleep apnea)   Atrial fibrillation, chronic (HCC)   Gastric AVM   Anemia   Anemia associated with acute blood loss   GI bleed   Hx of CABG   Hypotension   Heme positive stool   AVM (arteriovenous malformation) of small bowel, acquired   History of iron deficiency   S/P AVR   Prosthetic valve endocarditis (HCC)   Diskitis   Vertebral osteomyelitis (HCC)   Malnutrition of moderate degree          Assessment: 89 year old with aortic valve stenosis status post AVR, history of colon cancer status post hemicolectomy presented with worsening fatigue, generalized weakness shortness of breath times several weeks.  Admitted with secondary GI bleed status post endoscopy showing multiple AVMs in the stomach and 1 duodenal AVM that was ablated on 1/4, noted ulcerations in the fundus of stomach status post biopsies negative for malignancy/H. 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  Anticipate a prolonged antibiotic course likely 8 weeks Blood cultures from 1/7 cleared Place PICC   Microbiology:   Antibiotics: Ceftriaxone 1/6-(bid dosing since 1/9) Cultures: Blood 1/5 2/2 strep infatarius 1/7 NG   SUBJECTIVE: Resting  in bed. Wife at bedside Interval:  Afebrile overnight. Ebc 7.8k Review of Systems: ROS   Scheduled Meds:  amitriptyline  25 mg Oral QHS   brimonidine  1 drop Both Eyes BID   calcium-vitamin D  1 tablet Oral Daily   cholecalciferol  1,000 Units Oral Daily   cyanocobalamin  1,000 mcg Oral Daily   dorzolamide-timolol  1 drop Both Eyes BID   feeding supplement  237 mL Oral TID BM   gabapentin  300 mg Oral QID   latanoprost  1 drop Both Eyes QHS   lidocaine  1 patch Transdermal Q24H   multivitamin with minerals  1 tablet Oral Daily   pantoprazole  40 mg Oral BID AC   perphenazine  2 mg Oral Once per day on Mon Wed Fri   polyethylene glycol  17 g Oral Daily   prednisoLONE acetate  1 drop Right Eye Daily   psyllium  1 packet Oral Daily   rosuvastatin  5 mg Oral Q M,W,F   senna-docusate  1 tablet Oral QHS   sucralfate  1 g Oral BID   Continuous Infusions:  cefTRIAXone (ROCEPHIN)  IV 2 g (09/10/22 2254)   [START ON 09/19/2022] cefTRIAXone (ROCEPHIN)  IV     PRN Meds:.guaiFENesin-dextromethorphan, ipratropium-albuterol, metoprolol tartrate, mouth rinse, oxyCODONE, traMADol Allergies  Allergen Reactions   Xarelto [Rivaroxaban]     GI BLEED    OBJECTIVE: Vitals:   09/09/22 2044 09/10/22 0600 09/10/22 1330 09/10/22 2038  BP: (!) 133/96 (!) 152/85 96/68 127/78  Pulse: 98 (!) 101 94 93  Resp: '18 18  16  '$ Temp: 98  F (36.7 C) 97.8 F (36.6 C) 98.7 F (37.1 C) 98.9 F (37.2 C)  TempSrc: Oral Oral Oral Oral  SpO2: 94% 96% 94% 96%  Weight:      Height:       Body mass index is 22.46 kg/m.  Physical Exam    Lab Results Lab Results  Component Value Date   WBC 9.6 09/10/2022   HGB 8.6 (L) 09/10/2022   HCT 28.5 (L) 09/10/2022   MCV 89.9 09/10/2022   PLT 216 09/10/2022    Lab Results  Component Value Date   CREATININE 0.69 09/08/2022   BUN 8 09/08/2022   NA 133 (L) 09/08/2022   K 3.7 09/08/2022   CL 99 09/08/2022   CO2 27 09/08/2022    Lab Results  Component  Value Date   ALT 12 09/06/2022   AST 18 09/06/2022   ALKPHOS 69 09/06/2022   BILITOT 0.2 (L) 09/06/2022        [name], [name] 09/11/2022, 5:02 AM

## 2022-09-11 NOTE — Interval H&P Note (Signed)
History and Physical Interval Note:  09/11/2022 10:50 AM  Jason [name]  has presented today for surgery, with the diagnosis of stroke.  The various methods of treatment have been discussed with the patient and family. After consideration of risks, benefits and other options for treatment, the patient has consented to  Procedure(s): TRANSESOPHAGEAL ECHOCARDIOGRAM (TEE) (N/A) as a surgical intervention.  The patient's history has been reviewed, patient examined, no change in status, stable for surgery.  I have reviewed the patient's chart and labs.  Questions were answered to the patient's satisfaction.     Janina Mayo

## 2022-09-11 NOTE — Progress Notes (Signed)
[facility]                                                                               Jason [name], is a 89 y.o. male, DOB - [birth year], SAY:[id number] Admit date - 08/29/2022    Outpatient Primary MD for the patient is [name], MD  LOS - 13  days    Brief summary   89 year old gentleman prior history of coronary artery disease s/p CABG, AVR, chronic atrial fibrillation on anticoagulation with Eliquis, obstructive sleep apnea on CPAP, bladder carcinoma s/p TURB, prostate cancer, history of GI bleed with presence of gastric and small bowel AVMs on endoscopy, history of colon cancer s/p right hemicolectomy presents with generalized weakness and fatigue and was found to have a hemoglobin of 8.6 from baseline of 13 . GI consulted and he was found to have multiple AVM's in the stomach and duodenum , were ablated. He underwent TEE today, was found to have mild prosthetic AV thickening suggestive of infective endocarditis.    Assessment & Plan    Assessment and Plan:   Acute blood loss anemia likely sec to GI bleed from AVM's GI consulted, SBE done showed mmultiple AVM'S in the stomach and duodenum AVM which were ablated.  Noted ulceration in the fundus of the stomach which was biopsied  Anemia panel showed iron 15, saturation 5 On IV iron supplements X 4 doses,  Holding Eliquis for now, waiting for GI recommendations .  Continue Protonix 40 mg BID, start Carafate as per GI, with 1 gm BID.   No bloody bowel movements or melena.  Hemoglobin stable around 8.    Strep infantarius bacteremia (bovis family) Likely from discitis vs vertebral OM .  Initial blood cultures growing strep, repeat cultures are negative so far.  MRI of the lumbar spine concerning for L1 L2 discitis and OM.  ID consulted and recommended TEE, to rule out prosthetic valve endocarditis  in view of his poor dentition.  He will also need outpatient colonoscopy to check colon cancer.  He  underwent TEE today, was found to have mild prosthetic AV thickening suggestive of infective endocarditis. ID recommended ceftriaxone 2 g bid for 14 days followed by daily ceftriaxone until 10/28/2022. Plan for prolonged antibiotic course.  PICC line ordered.   Hx of CABG, AVR No chest pain or sob currently.  Anticoagulation on hold due to GI bleed.   Atrial fibrillation, chronic (HCC) Better controlled on metoprolol.  Anti coagulation on hold due to GI bleed.     OSA (obstructive sleep apnea) On CPAP   Chronic back pain Pain control and physical therapy.  Outpatient appointment with neurosurgery was rescheduled with Dr. Saintclair Halsted by wife    Hyperlipidemia Continue with crestor.      Occasional cough:  Cxr showing. Constellation of findings are favored to reflect pulmonary edema with bibasilar atelectasis and small bilateral pleural effusions. Cough has improved.       Pressure injury present on admission:  RN Pressure Injury Documentation: Pressure Injury 08/29/22 Vertebral column Lower Stage 2 -  Partial thickness loss of dermis presenting as a shallow open injury with a red, pink wound bed without slough. two  quarter sized pressure wounds over vertebra (Active)  08/29/22 1410  Location: Vertebral column  Location Orientation: Lower  Staging: Stage 2 -  Partial thickness loss of dermis presenting as a shallow open injury with a red, pink wound bed without slough.  Wound Description (Comments): two quarter sized pressure wounds over vertebra  Present on Admission: Yes  Dressing Type Foam - Lift dressing to assess site every shift 09/11/22 0940    Malnutrition Type:  Nutrition Problem: Moderate Malnutrition Etiology: chronic illness (vertebral osteomyelitis)   Malnutrition Characteristics:  Signs/Symptoms: moderate fat depletion, mild muscle depletion, energy intake < or equal to 50% for > or equal to 1 month, percent weight loss   Nutrition  Interventions:  Interventions: Ensure Enlive (each supplement provides 350kcal and 20 grams of protein), MVI  Estimated body mass index is 22.46 kg/m as calculated from the following:   Height as of this encounter: '5\' 10"'$  (1.778 m).   Weight as of this encounter: 71 kg.  Code Status: DNR.  DVT Prophylaxis:  SCDs Start: 08/29/22 2210   Level of Care: Level of care: Progressive Family Communication: none at bedside.   Disposition Plan:     Remains inpatient appropriate:  IV antibiotics.   Procedures:  TTE.  TEE scheduled for 09/11/22.  Consultants:   ID Cardiology.  Antimicrobials:   Anti-infectives (From admission, onward)    Start     Dose/Rate Route Frequency Ordered Stop   09/19/22 1000  cefTRIAXone (ROCEPHIN) 2 g in sodium chloride 0.9 % 100 mL IVPB        2 g 200 mL/hr over 30 Minutes Intravenous Every 24 hours 09/05/22 1003     09/05/22 1100  cefTRIAXone (ROCEPHIN) 2 g in sodium chloride 0.9 % 100 mL IVPB        2 g 200 mL/hr over 30 Minutes Intravenous Every 12 hours 09/05/22 1003 09/19/22 0959   09/02/22 1500  cefTRIAXone (ROCEPHIN) 2 g in sodium chloride 0.9 % 100 mL IVPB  Status:  Discontinued        2 g 200 mL/hr over 30 Minutes Intravenous Every 24 hours 09/02/22 1344 09/05/22 1003        Medications  Scheduled Meds:  amitriptyline  25 mg Oral QHS   brimonidine  1 drop Both Eyes BID   calcium-vitamin D  1 tablet Oral Daily   cholecalciferol  1,000 Units Oral Daily   cyanocobalamin  1,000 mcg Oral Daily   dorzolamide-timolol  1 drop Both Eyes BID   feeding supplement  237 mL Oral TID BM   gabapentin  300 mg Oral QID   latanoprost  1 drop Both Eyes QHS   lidocaine  1 patch Transdermal Q24H   multivitamin with minerals  1 tablet Oral Daily   pantoprazole  40 mg Oral BID AC   perphenazine  2 mg Oral Once per day on Mon Wed Fri   polyethylene glycol  17 g Oral Daily   prednisoLONE acetate  1 drop Right Eye Daily   psyllium  1 packet Oral Daily    rosuvastatin  5 mg Oral Q M,W,F   senna-docusate  1 tablet Oral QHS   sucralfate  1 g Oral BID   Continuous Infusions:  cefTRIAXone (ROCEPHIN)  IV 2 g (09/11/22 0925)   [START ON 09/19/2022] cefTRIAXone (ROCEPHIN)  IV     PRN Meds:.guaiFENesin-dextromethorphan, ipratropium-albuterol, metoprolol tartrate, mouth rinse, oxyCODONE, traMADol    Subjective:   Jason [name] was seen and examined today.   No  new complaints.   Objective:   Vitals:   09/11/22 1250 09/11/22 1300 09/11/22 1327 09/11/22 1404  BP: (!) 124/111 120/68 116/67 126/83  Pulse: 92 90  97  Resp: '11 12  14  '$ Temp:    98.3 F (36.8 C)  TempSrc:    Oral  SpO2: 92% 94%  96%  Weight:      Height:        Intake/Output Summary (Last 24 hours) at 09/11/2022 1737 Last data filed at 09/11/2022 1155 Gross per 24 hour  Intake 390 ml  Output 1100 ml  Net -710 ml    Filed Weights   08/29/22 1305 08/29/22 2044 09/02/22 0634  Weight: 77.1 kg 69.4 kg 71 kg     Exam General exam: Appears calm and comfortable  Respiratory system: Clear to auscultation. Respiratory effort normal. Cardiovascular system: S1 & S2 heard, RRR. No JVD, . Gastrointestinal system: Abdomen is nondistended, soft and nontender.  Central nervous system: Alert and oriented. No focal neurological deficits. Extremities: Symmetric 5 x 5 power. Skin: No rashes, Psychiatry:  Mood & affect appropriate.        Data Reviewed:  I have personally reviewed following labs and imaging studies   CBC Lab Results  Component Value Date   WBC 9.7 09/11/2022   RBC 3.03 (L) 09/11/2022   HGB 8.4 (L) 09/11/2022   HCT 27.3 (L) 09/11/2022   MCV 90.1 09/11/2022   MCH 27.7 09/11/2022   PLT 206 09/11/2022   MCHC 30.8 09/11/2022   RDW 20.7 (H) 09/11/2022   LYMPHSABS 1.2 09/11/2022   MONOABS 1.2 (H) 09/11/2022   EOSABS 0.0 09/11/2022   BASOSABS 0.1 16/05/9603     Last metabolic panel Lab Results  Component Value Date   NA 133 (L) 09/11/2022   K 3.8  09/11/2022   CL 96 (L) 09/11/2022   CO2 28 09/11/2022   BUN 12 09/11/2022   CREATININE 0.84 09/11/2022   GLUCOSE 107 (H) 09/11/2022   GFRNONAA >60 09/11/2022   GFRAA >60 11/01/2016   CALCIUM 8.0 (L) 09/11/2022   PROT 5.9 (L) 09/06/2022   ALBUMIN 2.3 (L) 09/06/2022   LABGLOB 3.2 03/29/2016   BILITOT 0.2 (L) 09/06/2022   ALKPHOS 69 09/06/2022   AST 18 09/06/2022   ALT 12 09/06/2022   ANIONGAP 9 09/11/2022    CBG (last 3)  No results for input(s): "GLUCAP" in the last 72 hours.    Coagulation Profile: No results for input(s): "INR", "PROTIME" in the last 168 hours.   Radiology Studies: [place] EKG SITE RITE  Result Date: 09/11/2022 If Site Rite image not attached, placement could not be confirmed due to current cardiac rhythm.  ECHO TEE  Result Date: 09/11/2022    TRANSESOPHOGEAL ECHO REPORT   Patient Name:   Jason [name] [facility] Date of Exam: 09/11/2022 Medical Rec #:  [id number]         Height:       70.0 in Accession #:    [id number]        Weight:       156.5 lb Date of Birth:  [birth year]         BSA:          1.881 m Patient Age:    38 years          BP:           118/73 mmHg Patient Gender: M  HR:           93 bpm. Exam Location:  Inpatient Procedure: Transesophageal Echo, Color Doppler and 3D Echo Indications:     Bacteremia  History:         Patient has prior history of Echocardiogram examinations, most                  recent 09/04/2022. CAD, Prior CABG, Arrythmias:Atrial                  Fibrillation; Risk Factors:Sleep Apnea and Dyslipidemia. Strep                  infantarius bacteremia.                  Aortic Valve: 23 mm Magna porcine valve valve is present in the                  aortic position. Procedure Date: 05/2020.  Sonographer:     Darlina Sicilian RDCS Referring Phys:  [id number] Margie Billet Diagnosing Phys: [place] PROCEDURE: After discussion of the risks and benefits of a TEE, an informed consent was obtained from the patient. TEE procedure time was 12  minutes. The transesophogeal probe was passed without difficulty through the esophogus of the patient. Imaged were obtained with the patient in a left lateral decubitus position. Sedation performed by different physician. The patient was monitored while under deep sedation. Anesthestetic sedation was provided intravenously by Anesthesiology: 144.'73mg'$  of Propofol. Image quality was good. The patient's vital signs; including heart rate, blood pressure, and oxygen saturation; remained stable throughout the procedure. The patient developed no complications during the procedure.  IMPRESSIONS  1. Left ventricular ejection fraction, by estimation, is 60 to 65%. The left ventricle has normal function.  2. Right ventricular systolic function is normal. The right ventricular size is normal.  3. No left atrial/left atrial appendage thrombus was detected.  4. Trivial mitral valve regurgitation.  5. There is mild thickening (0.4 x 0.4 cm) of the [place] suggest of iinfective endocarditis. No PVL. Aortic valve regurgitation is not visualized. There is a 23 mm Magna porcine valve valve present in the aortic position. Procedure Date: 05/2020.  6. There is Moderate (Grade III) plaque. Conclusion(s)/Recommendation(s): Findings are concerning for vegetation/infective endocarditis as detailed above. FINDINGS  Left Ventricle: Left ventricular ejection fraction, by estimation, is 60 to 65%. The left ventricle has normal function. The left ventricular internal cavity size was normal in size. Right Ventricle: The right ventricular size is normal. Right ventricular systolic function is normal. Left Atrium: No left atrial/left atrial appendage thrombus was detected. Pericardium: There is no evidence of pericardial effusion. Mitral Valve: Trivial mitral valve regurgitation. Tricuspid Valve: The tricuspid valve is normal in structure. Tricuspid valve regurgitation is trivial. Aortic Valve: There is mild thickening (0.4 x 0.4 cm) of the [place] suggest  of iinfective endocarditis. No PVL. Aortic valve regurgitation is not visualized. There is a 23 mm Magna porcine valve valve present in the aortic position. Procedure Date: 05/2020. Pulmonic Valve: Pulmonic valve regurgitation is mild. Aorta: There is moderate (Grade III) plaque. IAS/Shunts: No atrial level shunt detected by color flow Doppler. Phineas Inches Electronically signed by Phineas Inches Signature Date/Time: 09/11/2022/12:50:54 PM    Final        Hosie Poisson M.D. [facility] 09/11/2022, 5:37 PM  Available via Epic secure chat 7am-7pm After 7 pm, please refer to night coverage provider listed on amion.

## 2022-09-12 ENCOUNTER — Inpatient Hospital Stay (HOSPITAL_COMMUNITY): Payer: Medicare Other

## 2022-09-12 DIAGNOSIS — K31811 Angiodysplasia of stomach and duodenum with bleeding: Secondary | ICD-10-CM | POA: Diagnosis not present

## 2022-09-12 DIAGNOSIS — K31819 Angiodysplasia of stomach and duodenum without bleeding: Secondary | ICD-10-CM | POA: Diagnosis not present

## 2022-09-12 DIAGNOSIS — A491 Streptococcal infection, unspecified site: Secondary | ICD-10-CM | POA: Diagnosis not present

## 2022-09-12 LAB — CBC WITH DIFFERENTIAL/PLATELET
Abs Immature Granulocytes: 0.21 10*3/uL — ABNORMAL HIGH (ref 0.00–0.07)
Basophils Absolute: 0.1 10*3/uL (ref 0.0–0.1)
Basophils Relative: 1 %
Eosinophils Absolute: 0 10*3/uL (ref 0.0–0.5)
Eosinophils Relative: 0 %
HCT: 29.5 % — ABNORMAL LOW (ref 39.0–52.0)
Hemoglobin: 8.8 g/dL — ABNORMAL LOW (ref 13.0–17.0)
Immature Granulocytes: 2 %
Lymphocytes Relative: 12 %
Lymphs Abs: 1.3 10*3/uL (ref 0.7–4.0)
MCH: 27.3 pg (ref 26.0–34.0)
MCHC: 29.8 g/dL — ABNORMAL LOW (ref 30.0–36.0)
MCV: 91.6 fL (ref 80.0–100.0)
Monocytes Absolute: 1.1 10*3/uL — ABNORMAL HIGH (ref 0.1–1.0)
Monocytes Relative: 10 %
Neutro Abs: 8.2 10*3/uL — ABNORMAL HIGH (ref 1.7–7.7)
Neutrophils Relative %: 75 %
Platelets: 215 10*3/uL (ref 150–400)
RBC: 3.22 MIL/uL — ABNORMAL LOW (ref 4.22–5.81)
RDW: 20.3 % — ABNORMAL HIGH (ref 11.5–15.5)
WBC: 10.9 10*3/uL — ABNORMAL HIGH (ref 4.0–10.5)
nRBC: 0 % (ref 0.0–0.2)

## 2022-09-12 MED ORDER — BISACODYL 5 MG PO TBEC
20.0000 mg | DELAYED_RELEASE_TABLET | Freq: Once | ORAL | Status: AC
  Administered 2022-09-12: 20 mg via ORAL
  Filled 2022-09-12: qty 4

## 2022-09-12 MED ORDER — SODIUM CHLORIDE 0.9 % IV SOLN: INTRAVENOUS | Status: DC

## 2022-09-12 MED ORDER — CHLORHEXIDINE GLUCONATE CLOTH 2 % EX PADS
6.0000 | MEDICATED_PAD | Freq: Every day | CUTANEOUS | Status: DC
  Administered 2022-09-12 – 2022-09-14 (×3): 6 via TOPICAL

## 2022-09-12 MED ORDER — SODIUM CHLORIDE 0.9% FLUSH
10.0000 mL | Freq: Two times a day (BID) | INTRAVENOUS | Status: DC
  Administered 2022-09-12: 10 mL

## 2022-09-12 MED ORDER — SODIUM CHLORIDE 0.9% FLUSH: 10.0000 mL | INTRAVENOUS | Status: DC | PRN

## 2022-09-12 MED ORDER — METOCLOPRAMIDE HCL 5 MG/ML IJ SOLN
10.0000 mg | Freq: Once | INTRAMUSCULAR | Status: AC
  Administered 2022-09-13: 10 mg via INTRAVENOUS
  Filled 2022-09-12: qty 2

## 2022-09-12 MED ORDER — PEG-KCL-NACL-NASULF-NA ASC-C 100 G PO SOLR
0.5000 | Freq: Two times a day (BID) | ORAL | Status: AC
  Administered 2022-09-12 – 2022-09-13 (×2): 100 g via ORAL
  Filled 2022-09-12: qty 1

## 2022-09-12 MED ORDER — METOCLOPRAMIDE HCL 5 MG/ML IJ SOLN
10.0000 mg | Freq: Once | INTRAMUSCULAR | Status: AC
  Administered 2022-09-12: 10 mg via INTRAVENOUS
  Filled 2022-09-12: qty 2

## 2022-09-12 NOTE — Progress Notes (Signed)
Nutrition Follow-up  DOCUMENTATION CODES:   Non-severe (moderate) malnutrition in context of chronic illness  INTERVENTION:  - Clear Liquid diet today for colonoscopy. Recommend Regular diet after OR tomorrow to avoid restricting patient's intake.   - Ensure Plus High Protein po TID, each supplement provides 350 kcal and 20 grams of protein. - Add Magic Cup BID, each supplement provides 290 kcal and 9 grams of protein.  - Encourage intake at all meals and of supplements.   - Continue calcium, vitamin D, vitamin B12 as medically appropriate.  - Continue MVI to support micronutrient needs.  - Monitor weight trends.    NUTRITION DIAGNOSIS:   Moderate Malnutrition related to chronic illness (vertebral osteomyelitis) as evidenced by moderate fat depletion, mild muscle depletion, energy intake < or equal to 50% for > or equal to 1 month, percent weight loss. *ongoing  GOAL:   Patient will meet greater than or equal to 90% of their needs *unmet  MONITOR:   PO intake, Supplement acceptance, Labs, Weight trends, I & O's, Skin  REASON FOR ASSESSMENT:   Malnutrition Screening Tool    ASSESSMENT:   89 year old gentleman prior history of coronary artery disease s/p CABG, AVR, chronic atrial fibrillation on anticoagulation with Eliquis, obstructive sleep apnea on CPAP, bladder carcinoma s/p TURB, prostate cancer, history of GI bleed with presence of gastric and small bowel AVMs on endoscopy, history of colon cancer s/p right hemicolectomy presents with generalized weakness and fatigue.  Patient sleeping at time of visit but wife at bedside. She reports patient hasn't been eating very well as his appetite remains poor. Has had poor appetite for over 6 weeks. Notes he is trying to eat at meals. Patient is documented to be consuming 0-50% of meals over te past week.  Thankfully, wife reports patient likes Ensure and has been drinking it. Per RN documentation patient consuming Ensure but not  three times a day. Discussed adding Magic Cup for additional nutritional support and wife feels patient would like them. Patient is due for a new weight.   Medications reviewed and include: Calcium, vitamin D, vitamin B12, MVI, Senokot, Carafate  Labs reviewed:  -   Diet Order:   Diet Order             Diet NPO time specified Except for: Sips with Meds  Diet effective midnight           Diet clear liquid Room service appropriate? Yes; Fluid consistency: Thin  Diet effective now                   EDUCATION NEEDS:  Education needs have been addressed  Skin:  Skin Assessment: Reviewed RN Assessment Skin Integrity Issues:: Stage II Stage II: vertebral column  Last BM:  1/14  Height:  Ht Readings from Last 1 Encounters:  08/29/22 '5\' 10"'$  (1.778 m)   Weight:  Wt Readings from Last 1 Encounters:  09/02/22 71 kg    BMI:  Body mass index is 22.46 kg/m.  Estimated Nutritional Needs:  Kcal:  1600-1800 Protein:  70-80g Fluid:  1.6L/day    Samson Frederic RD, LDN For contact information, refer to [facility].

## 2022-09-12 NOTE — Care Management Important Message (Signed)
Important Message  Patient Details IM Letter given. Name: Jason [name] MRN: [id number] Date of Birth: April 18, 1926   Medicare Important Message Given:  Yes     Kerin Salen 09/12/2022, 9:59 AM

## 2022-09-12 NOTE — Progress Notes (Signed)
PHARMACY CONSULT NOTE FOR:  OUTPATIENT  PARENTERAL ANTIBIOTIC THERAPY (OPAT)  Indication: Prosthetic valve endocarditis Regimen: Ceftriaxone 2g IV q12h through 09/18/2022, then ceftriaxone 2g IV q24h through 10/28/2022 End date: 10/28/2022  IV antibiotic discharge orders are pended. To discharging provider:  please sign these orders via discharge navigator,  Select New Orders & click on the button choice - Manage This Unsigned Work.     Thank you for allowing pharmacy to be a part of this patient's care.  Candie Mile 09/12/2022, 2:23 PM

## 2022-09-12 NOTE — Progress Notes (Signed)
[facility]  Date of Admission:  08/29/2022   Total days of inpatient antibiotics 11  Principal Problem:   Streptococcus bovis infection Active Problems:   OSA (obstructive sleep apnea)   Atrial fibrillation, chronic (HCC)   Gastric AVM   Anemia   Anemia associated with acute blood loss   GI bleed   Hx of CABG   Hypotension   Heme positive stool   AVM (arteriovenous malformation) of small bowel, acquired   History of iron deficiency   S/P AVR   Prosthetic valve endocarditis (HCC)   Diskitis   Vertebral osteomyelitis (HCC)   Malnutrition of moderate degree          Assessment: 89 year old with aortic valve stenosis status post AVR, history of colon cancer status post hemicolectomy presented with worsening fatigue, generalized weakness shortness of breath times several weeks.  Admitted with secondary GI bleed status post endoscopy showing multiple AVMs in the stomach and 1 duodenal AVM that was ablated on 1/4, noted ulcerations in the fundus of stomach status post biopsies negative for malignancy/H. 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    #L1/S2 discitis/osteomyelitis as well as arachnoiditus -1/5 2/2 sets blood cultures growing strep infantarius -TEE on 1/15 showed mild thickening (0.4 x 0.4 cm) of the [place] suggest of  infective endocarditis   Recommendations: -Continue Ceftriaxone 2gm bid,  plan 14 days of twice daily ceftriaxone given arachnoiditis (1/9-1/22) then switched to 2 g daily EOT 10/28/22 to complete a prolonged antibiotic course of 8 weeks -F/U with Dr.Manandahar  ID will sign off  OPAT ORDERS:  Diagnosis: native AV endocarditis,/vertebral OM-arachnoiditis  Culture Result: Streptococcus infantarius bacteremia   Allergies  Allergen Reactions   Xarelto [Rivaroxaban]     GI BLEED      Discharge antibiotics to be given via PICC line: Ceftriaxone 2 g bid(1/9-1/22)  Then Ceftrixone  2 g daily EOT 10/28/22    Duration: 8 weeks End Date: 10/28/22  [facility] Care Per Protocol with Biopatch Use: Home health RN for IV administration and teaching, line care and labs.    Labs weekly while on IV antibiotics: __x CBC with differential  __x CMP _x_ CRP __x ESR   _x_ Please leave PIC in place until doctor has seen patient or been notified  Fax weekly labs to [phone]  Clinic Follow Up Appt: 1/31  @ [facility] with Dr.Manadhar  Microbiology:   Antibiotics: Ceftriaxone 1/6-(bid dosing since 1/9) Cultures: Blood 1/5 2/2 strep infatarius 1/7 NG   SUBJECTIVE: Resting in bed. No new complaints Interval:  Afebrile overnight. Wbc 10.9k Review of Systems: Review of Systems  All other systems reviewed and are negative.    Scheduled Meds:  amitriptyline  25 mg Oral QHS   brimonidine  1 drop Both Eyes BID   calcium-vitamin D  1 tablet Oral Daily   cholecalciferol  1,000 Units Oral Daily   cyanocobalamin  1,000 mcg Oral Daily   dorzolamide-timolol  1 drop Both Eyes BID   feeding supplement  237 mL Oral TID BM   gabapentin  300 mg Oral QID   latanoprost  1 drop Both Eyes QHS   lidocaine  1 patch Transdermal Q24H   multivitamin with minerals  1 tablet Oral Daily   pantoprazole  40 mg Oral BID AC   perphenazine  2 mg Oral Once per  day on Mon Wed Fri   polyethylene glycol  17 g Oral Daily   prednisoLONE acetate  1 drop Right Eye Daily   psyllium  1 packet Oral Daily   rosuvastatin  5 mg Oral Q M,W,F   senna-docusate  1 tablet Oral QHS   sucralfate  1 g Oral BID   Continuous Infusions:  cefTRIAXone (ROCEPHIN)  IV 2 g (09/11/22 2201)   [START ON 09/19/2022] cefTRIAXone (ROCEPHIN)  IV     PRN Meds:.guaiFENesin-dextromethorphan, ipratropium-albuterol, metoprolol tartrate, mouth rinse, oxyCODONE, traMADol Allergies  Allergen Reactions   Xarelto [Rivaroxaban]      GI BLEED    OBJECTIVE: Vitals:   09/11/22 1327 09/11/22 1404 09/11/22 2028 09/12/22 0440  BP: 116/67 126/83 116/71 105/61  Pulse:  97 99 (!) 102  Resp:  '14 20 16  '$ Temp:  98.3 F (36.8 C) 98.3 F (36.8 C) (!) 97.5 F (36.4 C)  TempSrc:  Oral Oral Oral  SpO2:  96% 96% 92%  Weight:      Height:       Body mass index is 22.46 kg/m.  Physical Exam Constitutional:      General: He is not in acute distress.    Appearance: He is normal weight. He is not toxic-appearing.  HENT:     Head: Normocephalic and atraumatic.     Right Ear: External ear normal.     Left Ear: External ear normal.     Nose: No congestion or rhinorrhea.     Mouth/Throat:     Mouth: Mucous membranes are moist.     Pharynx: Oropharynx is clear.  Eyes:     Extraocular Movements: Extraocular movements intact.     Conjunctiva/sclera: Conjunctivae normal.     Pupils: Pupils are equal, round, and reactive to light.  Cardiovascular:     Rate and Rhythm: Normal rate and regular rhythm.     Heart sounds: No murmur heard.    No friction rub. No gallop.  Pulmonary:     Effort: Pulmonary effort is normal.     Breath sounds: Normal breath sounds.  Abdominal:     General: Abdomen is flat. Bowel sounds are normal.     Palpations: Abdomen is soft.  Musculoskeletal:        General: No swelling. Normal range of motion.     Cervical back: Normal range of motion and neck supple.  Skin:    General: Skin is warm and dry.  Neurological:     General: No focal deficit present.     Mental Status: He is oriented to person, place, and time.  Psychiatric:        Mood and Affect: Mood normal.       Lab Results Lab Results  Component Value Date   WBC 9.7 09/11/2022   HGB 8.4 (L) 09/11/2022   HCT 27.3 (L) 09/11/2022   MCV 90.1 09/11/2022   PLT 206 09/11/2022    Lab Results  Component Value Date   CREATININE 0.84 09/11/2022   BUN 12 09/11/2022   NA 133 (L) 09/11/2022   K 3.8 09/11/2022   CL 96 (L)  09/11/2022   CO2 28 09/11/2022    Lab Results  Component Value Date   ALT 12 09/06/2022   AST 18 09/06/2022   ALKPHOS 69 09/06/2022   BILITOT 0.2 (L) 09/06/2022        [name], [place] [facility] 09/12/2022, 6:20 AM

## 2022-09-12 NOTE — Progress Notes (Signed)
Peripherally Inserted Central Catheter Placement  The IV Nurse has discussed with the patient and/or persons authorized to consent for the patient, the purpose of this procedure and the potential benefits and risks involved with this procedure.  The benefits include less needle sticks, lab draws from the catheter, and the patient may be discharged home with the catheter. Risks include, but not limited to, infection, bleeding, blood clot (thrombus formation), and puncture of an artery; nerve damage and irregular heartbeat and possibility to perform a PICC exchange if needed/ordered by physician.  Alternatives to this procedure were also discussed.  Bard Power PICC patient education guide, fact sheet on infection prevention and patient information card has been provided to patient /or left at bedside.    PICC Placement Documentation  PICC Single Lumen 78/58/85 Right Basilic 40 cm 0 cm (Active)  Indication for Insertion or Continuance of Line Home intravenous therapies (PICC only) 09/12/22 1000  Exposed Catheter (cm) 0 cm 09/12/22 1000  Site Assessment Clean, Dry, Intact 09/12/22 1000  Line Status Flushed;Saline locked;Blood return noted 09/12/22 1000  Dressing Type Transparent;Securing device 09/12/22 1000  Dressing Status Antimicrobial disc in place;Clean, Dry, Intact 09/12/22 1000  Safety Lock Not Applicable 02/77/41 2878  Line Care Connections checked and tightened 09/12/22 1000  Line Adjustment (NICU/IV Team Only) No 09/12/22 1000  Dressing Intervention New dressing 09/12/22 1000  Dressing Change Due 09/19/22 09/12/22 [place] 09/12/2022, 10:45 AM

## 2022-09-12 NOTE — H&P (View-Only) (Signed)
Daily Progress Note  Hospital Day: 15   Chief Complaint: Heme positive anemia / strep infantarius ( bovis) bacteremia   Assessment   Brief Narrative:  DALEON WILLINGER is a 89 y.o. male with a pmh of coronary artery disease s/p CABG, AVR, chronic atrial fibrillation on anticoagulation with Eliquis, obstructive sleep apnea on CPAP, bladder carcinoma s/p TURB, prostate cancer, gastric and small bowel AVMs , PUD, history of colon cancer s/p right hemicolectomy, GERD/ hiatal hernia. Patient admitted a couple of weeks ago with symptomatic anemia / FOBT+ on Eliquis. We saw him 08/30/22 in consultation.     # Acute on chronic anemia / FOBT + on Xarelto / Small bowel AVMS / non-bleeding gastric ulcers on endoscopy.  Small bowel endoscopy this admission with findings of a 6 cm HH, non-bleeding gastric ulcers,  3 non-bleeding angiodysplastic lesions in stomach treated with APC, gastritis, a non-bleeding angiodysplastic lesion in D3 treated with APC. See full report below Today: Hgb stable at 8.8 (baseline 13). Hasn't been transfused blood but received 2 doses IV iron     # History of colon cancer in 2018 s/p right hemicolectomy. A follow up colonoscopy in July 2019 with excellent prep was remarkable for one small tubular adenoma. Based on age, routine surveillance colonoscopy wasn't recommended.    # Strep infantarius ( bovis). Will be on prolonged course of antibiotics. ID has evaluated. He underwent TEE today. Bovis can be associated with colon cancer. He has a history of colon cancer.    # Possible vegetative / infective endocarditis on  TEE   # AFib. Eliquis has been on hold   Plan:    Patient would like to proceed with colonoscopy. Will prep today for procedure to be done tomorrow. The risks and benefits of colonoscopy with possible polypectomy / biopsies were discussed and the patient agrees to proceed.    ------------------------------------------------------------------------------------------------------------  GI Attending - I have seen and evaluated the patient also.  As per Ms. Guenther's note will perform colonoscopy tomorrow.The risks and benefits as well as alternatives of endoscopic procedure(s) have been discussed and reviewed. All questions answered. The patient agrees to proceed.  Gatha Mayer, MD, [facility] See Shea Evans on call - gastroenterology for best contact person 09/12/2022 3:23 PM   Subjective   Feels okay.   Objective   Imaging:  [place] EKG SITE RITE  Result Date: 09/11/2022 If Site Rite image not attached, placement could not be confirmed due to current cardiac rhythm.  ECHO TEE  Result Date: 09/11/2022    TRANSESOPHOGEAL ECHO REPORT   Patient Name:   LABRADFORD SCHNITKER [facility] Date of Exam: 09/11/2022 Medical Rec #:  [id number]         Height:       70.0 in Accession #:    [id number]        Weight:       156.5 lb Date of Birth:  1926/03/16         BSA:          1.881 m Patient Age:    37 years          BP:           118/73 mmHg Patient Gender: M                 HR:           93 bpm. Exam Location:  Inpatient Procedure: Transesophageal Echo, Color Doppler and 3D Echo Indications:  Bacteremia  History:         Patient has prior history of Echocardiogram examinations, most                  recent 09/04/2022. CAD, Prior CABG, Arrythmias:Atrial                  Fibrillation; Risk Factors:Sleep Apnea and Dyslipidemia. Strep                  infantarius bacteremia.                  Aortic Valve: 23 mm Magna porcine valve valve is present in the                  aortic position. Procedure Date: 05/2020.  Sonographer:     Darlina Sicilian RDCS Referring Phys:  [id number] Margie Billet Diagnosing Phys: [place] PROCEDURE: After discussion of the risks and benefits of a TEE, an informed consent was obtained from the patient. TEE procedure time was 12 minutes. The  transesophogeal probe was passed without difficulty through the esophogus of the patient. Imaged were obtained with the patient in a left lateral decubitus position. Sedation performed by different physician. The patient was monitored while under deep sedation. Anesthestetic sedation was provided intravenously by Anesthesiology: 144.'73mg'$  of Propofol. Image quality was good. The patient's vital signs; including heart rate, blood pressure, and oxygen saturation; remained stable throughout the procedure. The patient developed no complications during the procedure.  IMPRESSIONS  1. Left ventricular ejection fraction, by estimation, is 60 to 65%. The left ventricle has normal function.  2. Right ventricular systolic function is normal. The right ventricular size is normal.  3. No left atrial/left atrial appendage thrombus was detected.  4. Trivial mitral valve regurgitation.  5. There is mild thickening (0.4 x 0.4 cm) of the [place] suggest of iinfective endocarditis. No PVL. Aortic valve regurgitation is not visualized. There is a 23 mm Magna porcine valve valve present in the aortic position. Procedure Date: 05/2020.  6. There is Moderate (Grade III) plaque. Conclusion(s)/Recommendation(s): Findings are concerning for vegetation/infective endocarditis as detailed above. FINDINGS  Left Ventricle: Left ventricular ejection fraction, by estimation, is 60 to 65%. The left ventricle has normal function. The left ventricular internal cavity size was normal in size. Right Ventricle: The right ventricular size is normal. Right ventricular systolic function is normal. Left Atrium: No left atrial/left atrial appendage thrombus was detected. Pericardium: There is no evidence of pericardial effusion. Mitral Valve: Trivial mitral valve regurgitation. Tricuspid Valve: The tricuspid valve is normal in structure. Tricuspid valve regurgitation is trivial. Aortic Valve: There is mild thickening (0.4 x 0.4 cm) of the [place] suggest of  iinfective endocarditis. No PVL. Aortic valve regurgitation is not visualized. There is a 23 mm Magna porcine valve valve present in the aortic position. Procedure Date: 05/2020. Pulmonic Valve: Pulmonic valve regurgitation is mild. Aorta: There is moderate (Grade III) plaque. IAS/Shunts: No atrial level shunt detected by color flow Doppler. Phineas Inches Electronically signed by Phineas Inches Signature Date/Time: 09/11/2022/12:50:54 PM    Final    DG CHEST PORT 1 VIEW  Result Date: 09/09/2022 CLINICAL DATA:  10031 Cough 10031 EXAM: PORTABLE CHEST 1 VIEW COMPARISON:  August 29, 2022 FINDINGS: The cardiomediastinal silhouette is unchanged in contour.Status post median sternotomy and valve replacement. Small bilateral pleural effusions. No pneumothorax. Interstitial prominence and vascular congestion with bibasilar hazy opacities. Gaseous distension of bowel IMPRESSION: Constellation of findings are favored to  reflect pulmonary edema with bibasilar atelectasis and small bilateral pleural effusions. Electronically Signed   By: Valentino Saxon M.D.   On: 09/09/2022 12:08    Lab Results: Recent Labs    09/10/22 0543 09/11/22 0602 09/12/22 0516  WBC 9.6 9.7 10.9*  HGB 8.6* 8.4* 8.8*  HCT 28.5* 27.3* 29.5*  PLT 216 206 215   BMET Recent Labs    09/11/22 1411  NA 133*  K 3.8  CL 96*  CO2 28  GLUCOSE 107*  BUN 12  CREATININE 0.84  CALCIUM 8.0*   LFT No results for input(s): "PROT", "ALBUMIN", "AST", "ALT", "ALKPHOS", "BILITOT", "BILIDIR", "IBILI" in the last 72 hours. PT/INR No results for input(s): "LABPROT", "INR" in the last 72 hours.   Scheduled inpatient medications:   amitriptyline  25 mg Oral QHS   brimonidine  1 drop Both Eyes BID   calcium-vitamin D  1 tablet Oral Daily   cholecalciferol  1,000 Units Oral Daily   cyanocobalamin  1,000 mcg Oral Daily   dorzolamide-timolol  1 drop Both Eyes BID   feeding supplement  237 mL Oral TID BM   gabapentin  300 mg Oral QID    latanoprost  1 drop Both Eyes QHS   lidocaine  1 patch Transdermal Q24H   multivitamin with minerals  1 tablet Oral Daily   pantoprazole  40 mg Oral BID AC   perphenazine  2 mg Oral Once per day on Mon Wed Fri   polyethylene glycol  17 g Oral Daily   prednisoLONE acetate  1 drop Right Eye Daily   psyllium  1 packet Oral Daily   rosuvastatin  5 mg Oral Q M,W,F   senna-docusate  1 tablet Oral QHS   sucralfate  1 g Oral BID   Continuous inpatient infusions:   cefTRIAXone (ROCEPHIN)  IV 2 g (09/12/22 0949)   [START ON 09/19/2022] cefTRIAXone (ROCEPHIN)  IV     PRN inpatient medications: guaiFENesin-dextromethorphan, ipratropium-albuterol, metoprolol tartrate, mouth rinse, oxyCODONE, traMADol  Vital signs in last 24 hours: Temp:  [97.5 F (36.4 C)-98.3 F (36.8 C)] 97.5 F (36.4 C) (01/16 0440) Pulse Rate:  [84-102] 102 (01/16 0440) Resp:  [11-20] 16 (01/16 0440) BP: (90-126)/(58-111) 105/61 (01/16 0440) SpO2:  [88 %-96 %] 92 % (01/16 0440) Last BM Date : 09/10/22  Intake/Output Summary (Last 24 hours) at 09/12/2022 1009 Last data filed at 09/12/2022 0441 Gross per 24 hour  Intake 220 ml  Output 950 ml  Net -730 ml    Intake/Output from previous day: 01/15 0701 - 01/16 0700 In: 270 [P.O.:170; I.V.:100] Out: 950 [Urine:950] Intake/Output this shift: No intake/output data recorded.   Physical Exam:  General: Alert male in NAD Heart:  Regular rate .  Pulmonary: Normal respiratory effort Abdomen: Soft, nondistended, nontender. Normal bowel sounds. Neurologic: Alert and oriented Psych: Pleasant. Cooperative.    Principal Problem:   Streptococcus bovis infection Active Problems:   OSA (obstructive sleep apnea)   Atrial fibrillation, chronic (HCC)   Gastric AVM   Anemia   Anemia associated with acute blood loss   GI bleed   Hx of CABG   Hypotension   Heme positive stool   AVM (arteriovenous malformation) of small bowel, acquired   History of iron deficiency   S/P  AVR   Prosthetic valve endocarditis (HCC)   Diskitis   Vertebral osteomyelitis ([place])   Malnutrition of moderate degree     LOS: 14 days   Tye Savoy ,NP 09/12/2022, 10:09 AM

## 2022-09-12 NOTE — Progress Notes (Addendum)
Daily Progress Note  Hospital Day: 15   Chief Complaint: Heme positive anemia / strep infantarius ( bovis) bacteremia   Assessment   Brief Narrative:  Jason [name] is a 89 y.o. male with a pmh of coronary artery disease s/p CABG, AVR, chronic atrial fibrillation on anticoagulation with Eliquis, obstructive sleep apnea on CPAP, bladder carcinoma s/p TURB, prostate cancer, gastric and small bowel AVMs , PUD, history of colon cancer s/p right hemicolectomy, GERD/ hiatal hernia. Patient admitted a couple of weeks ago with symptomatic anemia / FOBT+ on Eliquis. We saw him 08/30/22 in consultation.     # Acute on chronic anemia / FOBT + on Xarelto / Small bowel AVMS / non-bleeding gastric ulcers on endoscopy.  Small bowel endoscopy this admission with findings of a 6 cm HH, non-bleeding gastric ulcers,  3 non-bleeding angiodysplastic lesions in stomach treated with APC, gastritis, a non-bleeding angiodysplastic lesion in D3 treated with APC. See full report below Today: Hgb stable at 8.8 (baseline 13). Hasn't been transfused blood but received 2 doses IV iron     # History of colon cancer in 2018 s/p right hemicolectomy. A follow up colonoscopy in July 2019 with excellent prep was remarkable for one small tubular adenoma. Based on age, routine surveillance colonoscopy wasn't recommended.    # Strep infantarius ( bovis). Will be on prolonged course of antibiotics. ID has evaluated. He underwent TEE today. Bovis can be associated with colon cancer. He has a history of colon cancer.    # Possible vegetative / infective endocarditis on  TEE   # AFib. Eliquis has been on hold   Plan:    Patient would like to proceed with colonoscopy. Will prep today for procedure to be done tomorrow. The risks and benefits of colonoscopy with possible polypectomy / biopsies were discussed and the patient agrees to proceed.    ------------------------------------------------------------------------------------------------------------  GI Attending - I have seen and evaluated the patient also.  As per Ms. Guenther's note will perform colonoscopy tomorrow.The risks and benefits as well as alternatives of endoscopic procedure(s) have been discussed and reviewed. All questions answered. The patient agrees to proceed.  Gatha Mayer, MD, [facility] See Shea Evans on call - gastroenterology for best contact person 09/12/2022 3:23 PM   Subjective   Feels okay.   Objective   Imaging:  [place] EKG SITE RITE  Result Date: 09/11/2022 If Site Rite image not attached, placement could not be confirmed due to current cardiac rhythm.  ECHO TEE  Result Date: 09/11/2022    TRANSESOPHOGEAL ECHO REPORT   Patient Name:   Jason [name] [facility] Date of Exam: 09/11/2022 Medical Rec #:  [id number]         Height:       70.0 in Accession #:    [id number]        Weight:       156.5 lb Date of Birth:  Sep 02, 1925         BSA:          1.881 m Patient Age:    56 years          BP:           118/73 mmHg Patient Gender: M                 HR:           93 bpm. Exam Location:  Inpatient Procedure: Transesophageal Echo, Color Doppler and 3D Echo Indications:  Bacteremia  History:         Patient has prior history of Echocardiogram examinations, most                  recent 09/04/2022. CAD, Prior CABG, Arrythmias:Atrial                  Fibrillation; Risk Factors:Sleep Apnea and Dyslipidemia. Strep                  infantarius bacteremia.                  Aortic Valve: 23 mm Magna porcine valve valve is present in the                  aortic position. Procedure Date: 05/2020.  Sonographer:     Darlina Sicilian RDCS Referring Phys:  [id number] Margie Billet Diagnosing Phys: [place] PROCEDURE: After discussion of the risks and benefits of a TEE, an informed consent was obtained from the patient. TEE procedure time was 12 minutes. The  transesophogeal probe was passed without difficulty through the esophogus of the patient. Imaged were obtained with the patient in a left lateral decubitus position. Sedation performed by different physician. The patient was monitored while under deep sedation. Anesthestetic sedation was provided intravenously by Anesthesiology: 144.'73mg'$  of Propofol. Image quality was good. The patient's vital signs; including heart rate, blood pressure, and oxygen saturation; remained stable throughout the procedure. The patient developed no complications during the procedure.  IMPRESSIONS  1. Left ventricular ejection fraction, by estimation, is 60 to 65%. The left ventricle has normal function.  2. Right ventricular systolic function is normal. The right ventricular size is normal.  3. No left atrial/left atrial appendage thrombus was detected.  4. Trivial mitral valve regurgitation.  5. There is mild thickening (0.4 x 0.4 cm) of the [place] suggest of iinfective endocarditis. No PVL. Aortic valve regurgitation is not visualized. There is a 23 mm Magna porcine valve valve present in the aortic position. Procedure Date: 05/2020.  6. There is Moderate (Grade III) plaque. Conclusion(s)/Recommendation(s): Findings are concerning for vegetation/infective endocarditis as detailed above. FINDINGS  Left Ventricle: Left ventricular ejection fraction, by estimation, is 60 to 65%. The left ventricle has normal function. The left ventricular internal cavity size was normal in size. Right Ventricle: The right ventricular size is normal. Right ventricular systolic function is normal. Left Atrium: No left atrial/left atrial appendage thrombus was detected. Pericardium: There is no evidence of pericardial effusion. Mitral Valve: Trivial mitral valve regurgitation. Tricuspid Valve: The tricuspid valve is normal in structure. Tricuspid valve regurgitation is trivial. Aortic Valve: There is mild thickening (0.4 x 0.4 cm) of the [place] suggest of  iinfective endocarditis. No PVL. Aortic valve regurgitation is not visualized. There is a 23 mm Magna porcine valve valve present in the aortic position. Procedure Date: 05/2020. Pulmonic Valve: Pulmonic valve regurgitation is mild. Aorta: There is moderate (Grade III) plaque. IAS/Shunts: No atrial level shunt detected by color flow Doppler. Phineas Inches Electronically signed by Phineas Inches Signature Date/Time: 09/11/2022/12:50:54 PM    Final    DG CHEST PORT 1 VIEW  Result Date: 09/09/2022 CLINICAL DATA:  10031 Cough 10031 EXAM: PORTABLE CHEST 1 VIEW COMPARISON:  August 29, 2022 FINDINGS: The cardiomediastinal silhouette is unchanged in contour.Status post median sternotomy and valve replacement. Small bilateral pleural effusions. No pneumothorax. Interstitial prominence and vascular congestion with bibasilar hazy opacities. Gaseous distension of bowel IMPRESSION: Constellation of findings are favored to  reflect pulmonary edema with bibasilar atelectasis and small bilateral pleural effusions. Electronically Signed   By: Valentino Saxon M.D.   On: 09/09/2022 12:08    Lab Results: Recent Labs    09/10/22 0543 09/11/22 0602 09/12/22 0516  WBC 9.6 9.7 10.9*  HGB 8.6* 8.4* 8.8*  HCT 28.5* 27.3* 29.5*  PLT 216 206 215   BMET Recent Labs    09/11/22 1411  NA 133*  K 3.8  CL 96*  CO2 28  GLUCOSE 107*  BUN 12  CREATININE 0.84  CALCIUM 8.0*   LFT No results for input(s): "PROT", "ALBUMIN", "AST", "ALT", "ALKPHOS", "BILITOT", "BILIDIR", "IBILI" in the last 72 hours. PT/INR No results for input(s): "LABPROT", "INR" in the last 72 hours.   Scheduled inpatient medications:   amitriptyline  25 mg Oral QHS   brimonidine  1 drop Both Eyes BID   calcium-vitamin D  1 tablet Oral Daily   cholecalciferol  1,000 Units Oral Daily   cyanocobalamin  1,000 mcg Oral Daily   dorzolamide-timolol  1 drop Both Eyes BID   feeding supplement  237 mL Oral TID BM   gabapentin  300 mg Oral QID    latanoprost  1 drop Both Eyes QHS   lidocaine  1 patch Transdermal Q24H   multivitamin with minerals  1 tablet Oral Daily   pantoprazole  40 mg Oral BID AC   perphenazine  2 mg Oral Once per day on Mon Wed Fri   polyethylene glycol  17 g Oral Daily   prednisoLONE acetate  1 drop Right Eye Daily   psyllium  1 packet Oral Daily   rosuvastatin  5 mg Oral Q M,W,F   senna-docusate  1 tablet Oral QHS   sucralfate  1 g Oral BID   Continuous inpatient infusions:   cefTRIAXone (ROCEPHIN)  IV 2 g (09/12/22 0949)   [START ON 09/19/2022] cefTRIAXone (ROCEPHIN)  IV     PRN inpatient medications: guaiFENesin-dextromethorphan, ipratropium-albuterol, metoprolol tartrate, mouth rinse, oxyCODONE, traMADol  Vital signs in last 24 hours: Temp:  [97.5 F (36.4 C)-98.3 F (36.8 C)] 97.5 F (36.4 C) (01/16 0440) Pulse Rate:  [84-102] 102 (01/16 0440) Resp:  [11-20] 16 (01/16 0440) BP: (90-126)/(58-111) 105/61 (01/16 0440) SpO2:  [88 %-96 %] 92 % (01/16 0440) Last BM Date : 09/10/22  Intake/Output Summary (Last 24 hours) at 09/12/2022 1009 Last data filed at 09/12/2022 0441 Gross per 24 hour  Intake 220 ml  Output 950 ml  Net -730 ml    Intake/Output from previous day: 01/15 0701 - 01/16 0700 In: 270 [P.O.:170; I.V.:100] Out: 950 [Urine:950] Intake/Output this shift: No intake/output data recorded.   Physical Exam:  General: Alert male in NAD Heart:  Regular rate .  Pulmonary: Normal respiratory effort Abdomen: Soft, nondistended, nontender. Normal bowel sounds. Neurologic: Alert and oriented Psych: Pleasant. Cooperative.    Principal Problem:   Streptococcus bovis infection Active Problems:   OSA (obstructive sleep apnea)   Atrial fibrillation, chronic (HCC)   Gastric AVM   Anemia   Anemia associated with acute blood loss   GI bleed   Hx of CABG   Hypotension   Heme positive stool   AVM (arteriovenous malformation) of small bowel, acquired   History of iron deficiency   S/P  AVR   Prosthetic valve endocarditis (HCC)   Diskitis   Vertebral osteomyelitis ([place])   Malnutrition of moderate degree     LOS: 14 days   Tye Savoy ,NP 09/12/2022, 10:09 AM

## 2022-09-12 NOTE — Progress Notes (Signed)
[facility]                                                                               Jason [name], is a 89 y.o. male, DOB - 15-Jun-1926, JGG:[id number] Admit date - 08/29/2022    Outpatient Primary MD for the patient is Jason Infante, MD  LOS - 14  days    Brief summary   89 year old gentleman prior history of coronary artery disease s/p CABG, AVR, chronic atrial fibrillation on anticoagulation with Eliquis, obstructive sleep apnea on CPAP, bladder carcinoma s/p TURB, prostate cancer, history of GI bleed with presence of gastric and small bowel AVMs on endoscopy, history of colon cancer s/p right hemicolectomy presents with generalized weakness and fatigue and was found to have a hemoglobin of 8.6 from baseline of 13 . GI consulted and he was found to have multiple AVM's in the stomach and duodenum , were ablated. He underwent TEE today, was found to have mild prosthetic AV thickening suggestive of infective endocarditis.    Assessment & Plan    Assessment and Plan:   Acute blood loss anemia likely sec to GI bleed from AVM's GI consulted, SBE done showed mmultiple AVM'S in the stomach and duodenum AVM which were ablated.  Noted ulceration in the fundus of the stomach which was biopsied  Anemia panel showed iron 15, saturation 5 On IV iron supplements X 4 doses,  Holding Eliquis for now, he is scheduled for colonoscopy in am.  Continue Protonix 40 mg BID, start Carafate as per GI, with 1 gm BID.   No bloody bowel movements or melena.  Hemoglobin stable around 8.    Strep infantarius bacteremia (bovis family) Likely from discitis vs vertebral OM .  Initial blood cultures growing strep, repeat cultures are negative so far.  MRI of the lumbar spine concerning for L1 L2 discitis and OM.  ID consulted and recommended TEE, to rule out prosthetic valve endocarditis  in view of his poor dentition.  He underwent TEE , was found to have mild prosthetic AV thickening  suggestive of infective endocarditis. ID recommended ceftriaxone 2 g bid for 14 days followed by daily ceftriaxone until 10/28/2022. Plan for prolonged antibiotic course.  PICC line ordered and placed today.  Plan for colonoscopy for evaluation of colon cancer.   Hx of CABG, AVR No chest pain or sob currently.  Anticoagulation on hold due to GI bleed.   Atrial fibrillation, chronic (HCC) Better controlled on metoprolol.  Anti coagulation on hold due to GI bleed.     OSA (obstructive sleep apnea) On CPAP   Chronic back pain Pain control and physical therapy.  Outpatient appointment with neurosurgery was rescheduled with Dr. Saintclair Halsted by wife    Hyperlipidemia Continue with crestor.      Occasional cough:  Cxr showing. Constellation of findings are favored to reflect pulmonary edema with bibasilar atelectasis and small bilateral pleural effusions. He was given a dose of IV lasix and his cough has improved. CXR also shows improvement in the effusions.     Pressure injury present on admission:  RN Pressure Injury Documentation: Pressure Injury 08/29/22 Vertebral column Lower Stage 2 -  Partial thickness  loss of dermis presenting as a shallow open injury with a red, pink wound bed without slough. two quarter sized pressure wounds over vertebra (Active)  08/29/22 1410  Location: Vertebral column  Location Orientation: Lower  Staging: Stage 2 -  Partial thickness loss of dermis presenting as a shallow open injury with a red, pink wound bed without slough.  Wound Description (Comments): two quarter sized pressure wounds over vertebra  Present on Admission: Yes  Dressing Type Foam - Lift dressing to assess site every shift 09/12/22 1100    Malnutrition Type:  Nutrition Problem: Moderate Malnutrition Etiology: chronic illness (vertebral osteomyelitis)   Malnutrition Characteristics:  Signs/Symptoms: moderate fat depletion, mild muscle depletion, energy intake < or equal to 50% for >  or equal to 1 month, percent weight loss   Nutrition Interventions:  Interventions: Ensure Enlive (each supplement provides 350kcal and 20 grams of protein), MVI  Estimated body mass index is 22.46 kg/m as calculated from the following:   Height as of this encounter: '5\' 10"'$  (1.778 m).   Weight as of this encounter: 71 kg.  Code Status: DNR.  DVT Prophylaxis:  SCDs Start: 08/29/22 2210   Level of Care: Level of care: Progressive Family Communication: discussed with wife outside the room.    Disposition Plan:     Remains inpatient appropriate:  IV antibiotics.   Procedures:  TTE.  TEE  Colonoscopy scheduled for tomorrow.   Consultants:   ID Cardiology.  Gastroenterology.   Antimicrobials:   Anti-infectives (From admission, onward)    Start     Dose/Rate Route Frequency Ordered Stop   09/19/22 1000  cefTRIAXone (ROCEPHIN) 2 g in sodium chloride 0.9 % 100 mL IVPB        2 g 200 mL/hr over 30 Minutes Intravenous Every 24 hours 09/05/22 1003     09/05/22 1100  cefTRIAXone (ROCEPHIN) 2 g in sodium chloride 0.9 % 100 mL IVPB        2 g 200 mL/hr over 30 Minutes Intravenous Every 12 hours 09/05/22 1003 09/19/22 0959   09/02/22 1500  cefTRIAXone (ROCEPHIN) 2 g in sodium chloride 0.9 % 100 mL IVPB  Status:  Discontinued        2 g 200 mL/hr over 30 Minutes Intravenous Every 24 hours 09/02/22 1344 09/05/22 1003        Medications  Scheduled Meds:  amitriptyline  25 mg Oral QHS   brimonidine  1 drop Both Eyes BID   calcium-vitamin D  1 tablet Oral Daily   Chlorhexidine Gluconate Cloth  6 each Topical Daily   cholecalciferol  1,000 Units Oral Daily   cyanocobalamin  1,000 mcg Oral Daily   dorzolamide-timolol  1 drop Both Eyes BID   feeding supplement  237 mL Oral TID BM   gabapentin  300 mg Oral QID   latanoprost  1 drop Both Eyes QHS   lidocaine  1 patch Transdermal Q24H   multivitamin with minerals  1 tablet Oral Daily   pantoprazole  40 mg Oral BID AC   peg 3350  powder  0.5 kit Oral Q12H   perphenazine  2 mg Oral Once per day on Mon Wed Fri   prednisoLONE acetate  1 drop Right Eye Daily   psyllium  1 packet Oral Daily   rosuvastatin  5 mg Oral Q M,W,F   senna-docusate  1 tablet Oral QHS   sodium chloride flush  10-40 mL Intracatheter Q12H   sucralfate  1 g Oral BID  Continuous Infusions:  sodium chloride     cefTRIAXone (ROCEPHIN)  IV 2 g (09/12/22 0949)   [START ON 09/19/2022] cefTRIAXone (ROCEPHIN)  IV     PRN Meds:.guaiFENesin-dextromethorphan, ipratropium-albuterol, metoprolol tartrate, mouth rinse, oxyCODONE, sodium chloride flush, traMADol    Subjective:   Marguerite Jarboe was seen and examined today.   No new complaints today.   Objective:   Vitals:   09/12/22 0930 09/12/22 1030 09/12/22 1100 09/12/22 1300  BP: 120/64 1'20/64 99/61 99/62 '$  Pulse: 76 97 97 92  Resp: '16 16 16 16  '$ Temp: 98 F (36.7 C) 98 F (36.7 C) 98.2 F (36.8 C)   TempSrc: Oral Oral Oral   SpO2:  96% 92% 92%  Weight:      Height:        Intake/Output Summary (Last 24 hours) at 09/12/2022 1333 Last data filed at 09/12/2022 1030 Gross per 24 hour  Intake 120 ml  Output 1000 ml  Net -880 ml    Filed Weights   08/29/22 1305 08/29/22 2044 09/02/22 0634  Weight: 77.1 kg 69.4 kg 71 kg     Exam General exam: ELDERLY gentleman, on RA, not in any distress.  Respiratory system: Clear to auscultation. Respiratory effort normal. Cardiovascular system: S1 & S2 heard, RRR. No JVD,  Gastrointestinal system: Abdomen is nondistended, soft and nontender.  Central nervous system: Alert and oriented. No focal neurological deficits. Extremities: Symmetric 5 x 5 power. Skin: No rashes,  Psychiatry: Mood & affect appropriate.         Data Reviewed:  I have personally reviewed following labs and imaging studies   CBC Lab Results  Component Value Date   WBC 10.9 (H) 09/12/2022   RBC 3.22 (L) 09/12/2022   HGB 8.8 (L) 09/12/2022   HCT 29.5 (L) 09/12/2022    MCV 91.6 09/12/2022   MCH 27.3 09/12/2022   PLT 215 09/12/2022   MCHC 29.8 (L) 09/12/2022   RDW 20.3 (H) 09/12/2022   LYMPHSABS 1.3 09/12/2022   MONOABS 1.1 (H) 09/12/2022   EOSABS 0.0 09/12/2022   BASOSABS 0.1 42/35/3614     Last metabolic panel Lab Results  Component Value Date   NA 133 (L) 09/11/2022   K 3.8 09/11/2022   CL 96 (L) 09/11/2022   CO2 28 09/11/2022   BUN 12 09/11/2022   CREATININE 0.84 09/11/2022   GLUCOSE 107 (H) 09/11/2022   GFRNONAA >60 09/11/2022   GFRAA >60 11/01/2016   CALCIUM 8.0 (L) 09/11/2022   PROT 5.9 (L) 09/06/2022   ALBUMIN 2.3 (L) 09/06/2022   LABGLOB 3.2 03/29/2016   BILITOT 0.2 (L) 09/06/2022   ALKPHOS 69 09/06/2022   AST 18 09/06/2022   ALT 12 09/06/2022   ANIONGAP 9 09/11/2022    CBG (last 3)  No results for input(s): "GLUCAP" in the last 72 hours.    Coagulation Profile: No results for input(s): "INR", "PROTIME" in the last 168 hours.   Radiology Studies: DG CHEST PORT 1 VIEW  Result Date: 09/12/2022 CLINICAL DATA:  PICC line placement. EXAM: PORTABLE CHEST 1 VIEW COMPARISON:  Chest radiograph 09/09/2022. FINDINGS: 1101 hours. Interval insertion of a right upper extremity PICC with tip projecting over the lower SVC. Intact median sternotomy wires and aortic valve replacement. Decreased bilateral pleural effusion. Improved aeration of the left lung base with persistent retrocardiac opacity, likely atelectasis. Stable cardiomegaly and mild interstitial prominence, most consistent with pulmonary edema. Unchanged gaseous distention of the bowel. IMPRESSION: 1. PICC tip projects over the upper SVC. 2.  Decreased pleural effusions with persistent mild pulmonary edema. Electronically Signed   By: Emmit Alexanders M.D   On: 09/12/2022 12:08   [place] EKG SITE RITE  Result Date: 09/11/2022 If Site Rite image not attached, placement could not be confirmed due to current cardiac rhythm.  ECHO TEE  Result Date: 09/11/2022    TRANSESOPHOGEAL  ECHO REPORT   Patient Name:   BRENNIN DURFEE [facility] Date of Exam: 09/11/2022 Medical Rec #:  [id number]         Height:       70.0 in Accession #:    [id number]        Weight:       156.5 lb Date of Birth:  06/15/1926         BSA:          1.881 m Patient Age:    34 years          BP:           118/73 mmHg Patient Gender: M                 HR:           93 bpm. Exam Location:  Inpatient Procedure: Transesophageal Echo, Color Doppler and 3D Echo Indications:     Bacteremia  History:         Patient has prior history of Echocardiogram examinations, most                  recent 09/04/2022. CAD, Prior CABG, Arrythmias:Atrial                  Fibrillation; Risk Factors:Sleep Apnea and Dyslipidemia. Strep                  infantarius bacteremia.                  Aortic Valve: 23 mm Magna porcine valve valve is present in the                  aortic position. Procedure Date: 05/2020.  Sonographer:     Darlina Sicilian RDCS Referring Phys:  [id number] Margie Billet Diagnosing Phys: [place] PROCEDURE: After discussion of the risks and benefits of a TEE, an informed consent was obtained from the patient. TEE procedure time was 12 minutes. The transesophogeal probe was passed without difficulty through the esophogus of the patient. Imaged were obtained with the patient in a left lateral decubitus position. Sedation performed by different physician. The patient was monitored while under deep sedation. Anesthestetic sedation was provided intravenously by Anesthesiology: 144.'73mg'$  of Propofol. Image quality was good. The patient's vital signs; including heart rate, blood pressure, and oxygen saturation; remained stable throughout the procedure. The patient developed no complications during the procedure.  IMPRESSIONS  1. Left ventricular ejection fraction, by estimation, is 60 to 65%. The left ventricle has normal function.  2. Right ventricular systolic function is normal. The right ventricular size is normal.  3. No left atrial/left atrial  appendage thrombus was detected.  4. Trivial mitral valve regurgitation.  5. There is mild thickening (0.4 x 0.4 cm) of the [place] suggest of iinfective endocarditis. No PVL. Aortic valve regurgitation is not visualized. There is a 23 mm Magna porcine valve valve present in the aortic position. Procedure Date: 05/2020.  6. There is Moderate (Grade III) plaque. Conclusion(s)/Recommendation(s): Findings are concerning for vegetation/infective endocarditis as detailed above. FINDINGS  Left Ventricle: Left ventricular ejection fraction, by estimation,  is 60 to 65%. The left ventricle has normal function. The left ventricular internal cavity size was normal in size. Right Ventricle: The right ventricular size is normal. Right ventricular systolic function is normal. Left Atrium: No left atrial/left atrial appendage thrombus was detected. Pericardium: There is no evidence of pericardial effusion. Mitral Valve: Trivial mitral valve regurgitation. Tricuspid Valve: The tricuspid valve is normal in structure. Tricuspid valve regurgitation is trivial. Aortic Valve: There is mild thickening (0.4 x 0.4 cm) of the [place] suggest of iinfective endocarditis. No PVL. Aortic valve regurgitation is not visualized. There is a 23 mm Magna porcine valve valve present in the aortic position. Procedure Date: 05/2020. Pulmonic Valve: Pulmonic valve regurgitation is mild. Aorta: There is moderate (Grade III) plaque. IAS/Shunts: No atrial level shunt detected by color flow Doppler. Phineas Inches Electronically signed by Phineas Inches Signature Date/Time: 09/11/2022/12:50:54 PM    Final        Hosie Poisson M.D. [facility] 09/12/2022, 1:33 PM  Available via Epic secure chat 7am-7pm After 7 pm, please refer to night coverage provider listed on amion.

## 2022-09-13 ENCOUNTER — Inpatient Hospital Stay (HOSPITAL_COMMUNITY): Payer: Medicare Other | Admitting: Certified Registered Nurse Anesthetist

## 2022-09-13 ENCOUNTER — Encounter (HOSPITAL_COMMUNITY)
Admission: EM | Disposition: A | Discharge status: Skilled Nursing Facility | Payer: Self-pay | Source: Other Acute Inpatient Hospital | Attending: Internal Medicine

## 2022-09-13 ENCOUNTER — Encounter (HOSPITAL_COMMUNITY): Payer: Self-pay | Admitting: Internal Medicine

## 2022-09-13 DIAGNOSIS — K56699 Other intestinal obstruction unspecified as to partial versus complete obstruction: Secondary | ICD-10-CM

## 2022-09-13 DIAGNOSIS — K6389 Other specified diseases of intestine: Secondary | ICD-10-CM

## 2022-09-13 DIAGNOSIS — A491 Streptococcal infection, unspecified site: Secondary | ICD-10-CM | POA: Diagnosis not present

## 2022-09-13 DIAGNOSIS — D649 Anemia, unspecified: Secondary | ICD-10-CM

## 2022-09-13 DIAGNOSIS — K648 Other hemorrhoids: Secondary | ICD-10-CM

## 2022-09-13 DIAGNOSIS — K5730 Diverticulosis of large intestine without perforation or abscess without bleeding: Secondary | ICD-10-CM

## 2022-09-13 DIAGNOSIS — Z87891 Personal history of nicotine dependence: Secondary | ICD-10-CM

## 2022-09-13 DIAGNOSIS — K6289 Other specified diseases of anus and rectum: Secondary | ICD-10-CM

## 2022-09-13 HISTORY — PX: COLONOSCOPY WITH PROPOFOL: SHX5780

## 2022-09-13 HISTORY — PX: SUBMUCOSAL TATTOO INJECTION: SHX6856

## 2022-09-13 HISTORY — PX: BIOPSY: SHX5522

## 2022-09-13 LAB — CBC WITH DIFFERENTIAL/PLATELET
Abs Immature Granulocytes: 0.12 10*3/uL — ABNORMAL HIGH (ref 0.00–0.07)
Basophils Absolute: 0.1 10*3/uL (ref 0.0–0.1)
Basophils Relative: 1 %
Eosinophils Absolute: 0 10*3/uL (ref 0.0–0.5)
Eosinophils Relative: 0 %
HCT: 25.5 % — ABNORMAL LOW (ref 39.0–52.0)
Hemoglobin: 7.9 g/dL — ABNORMAL LOW (ref 13.0–17.0)
Immature Granulocytes: 1 %
Lymphocytes Relative: 9 %
Lymphs Abs: 1.3 10*3/uL (ref 0.7–4.0)
MCH: 27.5 pg (ref 26.0–34.0)
MCHC: 31 g/dL (ref 30.0–36.0)
MCV: 88.9 fL (ref 80.0–100.0)
Monocytes Absolute: 1.2 10*3/uL — ABNORMAL HIGH (ref 0.1–1.0)
Monocytes Relative: 8 %
Neutro Abs: 11.8 10*3/uL — ABNORMAL HIGH (ref 1.7–7.7)
Neutrophils Relative %: 81 %
Platelets: 194 10*3/uL (ref 150–400)
RBC: 2.87 MIL/uL — ABNORMAL LOW (ref 4.22–5.81)
RDW: 19.9 % — ABNORMAL HIGH (ref 11.5–15.5)
WBC: 14.4 10*3/uL — ABNORMAL HIGH (ref 4.0–10.5)
nRBC: 0 % (ref 0.0–0.2)

## 2022-09-13 SURGERY — COLONOSCOPY WITH PROPOFOL
Anesthesia: Monitor Anesthesia Care

## 2022-09-13 MED ORDER — SPOT INK MARKER SYRINGE KIT
PACK | SUBMUCOSAL | Status: AC
  Filled 2022-09-13: qty 5

## 2022-09-13 MED ORDER — OXYCODONE HCL 5 MG PO TABS
2.5000 mg | ORAL_TABLET | ORAL | Status: DC | PRN
  Administered 2022-09-14 – 2022-09-15 (×3): 2.5 mg via ORAL
  Filled 2022-09-13 (×3): qty 1

## 2022-09-13 MED ORDER — PROPOFOL 500 MG/50ML IV EMUL
INTRAVENOUS | Status: DC | PRN
  Administered 2022-09-13: 100 ug/kg/min via INTRAVENOUS

## 2022-09-13 MED ORDER — LACTATED RINGERS IV SOLN: INTRAVENOUS | Status: DC | PRN

## 2022-09-13 MED ORDER — PROPOFOL 500 MG/50ML IV EMUL
INTRAVENOUS | Status: AC
  Filled 2022-09-13: qty 50

## 2022-09-13 MED ORDER — PROPOFOL 10 MG/ML IV BOLUS
INTRAVENOUS | Status: DC | PRN
  Administered 2022-09-13: 20 mg via INTRAVENOUS

## 2022-09-13 SURGICAL SUPPLY — 22 items

## 2022-09-13 NOTE — Anesthesia Postprocedure Evaluation (Signed)
Anesthesia Post Note  Patient: Jason [name]  Procedure(s) Performed: COLONOSCOPY WITH PROPOFOL BIOPSY SUBMUCOSAL TATTOO INJECTION     Patient location during evaluation: PACU Anesthesia Type: MAC Level of consciousness: awake and alert Pain management: pain level controlled Vital Signs Assessment: post-procedure vital signs reviewed and stable Respiratory status: spontaneous breathing, nonlabored ventilation, respiratory function stable and patient connected to nasal cannula oxygen Cardiovascular status: stable and blood pressure returned to baseline Postop Assessment: no apparent nausea or vomiting Anesthetic complications: no  No notable events documented.  Last Vitals:  Vitals:   09/13/22 1400 09/13/22 1435  BP: 125/64 129/81  Pulse: 99 91  Resp: 11 18  Temp:  (!) 36.2 C  SpO2: 100% 100%    Last Pain:  Vitals:   09/13/22 1435  TempSrc: Oral  PainSc:                  [name]

## 2022-09-13 NOTE — TOC Progression Note (Signed)
Transition of Care [facility]) - Progression Note    Patient Details  Name: Jason [name] MRN: [id number] Date of Birth: May 13, 1926  Transition of Care [facility]) CM/SW Contact  Purcell Mouton, RN Phone Number: 09/13/2022, 3:02 PM  Clinical Narrative:     Pt is active with [facility], PCP DR Mancel Bale, CSW Damita Dunnings.  Expected Discharge Plan: [place] Barriers to Discharge: Continued Medical Work up  Expected Discharge Plan and Services   Discharge Planning Services: CM Consult   Living arrangements for the past 2 months: [place], Apartment                                       Social Determinants of Health (SDOH) Interventions [place]: No Food Insecurity (09/01/2022)  Housing: Low Risk  (09/01/2022)  Transportation Needs: No Transportation Needs (09/01/2022)  Utilities: Not At Risk (09/01/2022)  Tobacco Use: Medium Risk (09/13/2022)    Readmission Risk Interventions     No data to display

## 2022-09-13 NOTE — Interval H&P Note (Signed)
History and Physical Interval Note:  09/13/2022 1:03 PM  Jason [name]  has presented today for surgery, with the diagnosis of history of colon cancer and step bovis bacteremia.  The various methods of treatment have been discussed with the patient and family. After consideration of risks, benefits and other options for treatment, the patient has consented to  Procedure(s): COLONOSCOPY WITH PROPOFOL (N/A) as a surgical intervention.  The patient's history has been reviewed, patient examined, no change in status, stable for surgery.  I have reviewed the patient's chart and labs.  Questions were answered to the patient's satisfaction.     Silvano Rusk

## 2022-09-13 NOTE — Op Note (Signed)
[facility] Patient Name: Jason [name] Procedure Date: 09/13/2022 MRN: [id number] Attending MD: Gatha Mayer , MD, [id number] Date of Birth: 06/08/1926 CSN: [id number] Age: 89 Admit Type: Inpatient Procedure:                Colonoscopy Indications:              Suspected colon cancer Providers:                Gatha Mayer, MD, Adah Perl RN, RN,                            William Dalton, Technician Referring MD:              Medicines:                Monitored Anesthesia Care Complications:            No immediate complications. Estimated Blood Loss:     Estimated blood loss was minimal. Procedure:                Pre-Anesthesia Assessment:                           - Prior to the procedure, a History and Physical                            was performed, and patient medications and                            allergies were reviewed. The patient's tolerance of                            previous anesthesia was also reviewed. The risks                            and benefits of the procedure and the sedation                            options and risks were discussed with the patient.                            All questions were answered, and informed consent                            was obtained. Prior Anticoagulants: The patient                            last took Eliquis (apixaban) 14 days prior to the                            procedure. ASA Grade Assessment: IV - A patient                            with severe systemic disease that is a constant  threat to life. After reviewing the risks and                            benefits, the patient was deemed in satisfactory                            condition to undergo the procedure.                           After obtaining informed consent, the colonoscope                            was passed under direct vision. Throughout the                            procedure, the  patient's blood pressure, pulse, and                            oxygen saturations were monitored continuously. The                            CF-HQ190L ([id number]) Olympus colonoscope was                            introduced through the anus with the intention of                            advancing to the surgical stoma. The scope was                            advanced to the splenic flexure before the                            procedure was aborted. Medications were given. The                            colonoscopy was somewhat difficult due to                            significant looping. The patient tolerated the                            procedure well. The quality of the bowel                            preparation was fair. The rectum was photographed. Scope In: 1:15:28 PM Scope Out: 1:28:38 PM Total Procedure Duration: 0 hours 13 minutes 10 seconds  Findings:      The digital rectal exam findings include decreased sphincter tone.      A malignant-appearing, intrinsic severe stenosis was found at the       splenic flexure and was non-traversed. Biopsies were taken with a cold       forceps for histology. Verification of patient identification for the       specimen was done.  Estimated blood loss was minimal. Area was tattooed       with an injection of 3 mL of Spot (carbon black).      Multiple large-mouthed diverticula were found in the sigmoid colon and       descending colon.      External and internal hemorrhoids were found.      The exam was otherwise without abnormality on direct and retroflexion       views. Impression:               - Preparation of the colon was fair.                           - Decreased sphincter tone found on digital rectal                            exam.                           - Stricture at the splenic flexure (estimated                            location). Biopsied. Tattooed.                           - Diverticulosis in the sigmoid  colon and in the                            descending colon.                           - External and internal hemorrhoids.                           - The examination was otherwise normal on direct                            and retroflexion views. Moderate Sedation:      Not Applicable - Patient had care per Anesthesia. Recommendation:           - Return patient to hospital ward for ongoing care.                           - Clear liquid diet.                           - Difficult situation - probably not a surgical                            candidate.                           I have started clear liquids                           Would await path - looks like malignant tissue in  this sttricture though not certain that is what I                            think.                           Seems prudent to leave off anti-coagulation atthis                            time.                           defer timing of surgical consult to [facility] - now vs                            after pathology is in Procedure Code(s):        --- Professional ---                           [phone], 66, Colonoscopy, flexible; with biopsy,                            single or multiple                           [id number], 13, Colonoscopy, flexible; with directed                            submucosal injection(s), any substance Diagnosis Code(s):        --- Professional ---                           K64.8, Other hemorrhoids                           K62.89, Other specified diseases of anus and rectum                           K56.699, Other intestinal obstruction unspecified                            as to partial versus complete obstruction                           K57.30, Diverticulosis of large intestine without                            perforation or abscess without bleeding CPT copyright 2022 American Medical Association. All rights reserved. The codes documented in this report are  preliminary and upon coder review may  be revised to meet current compliance requirements. Gatha Mayer, MD 09/13/2022 1:51:01 PM This report has been signed electronically. Number of Addenda: 0

## 2022-09-13 NOTE — Progress Notes (Signed)
PT Cancellation Note  Patient Details Name: Jason [name] MRN: [id number] DOB: 1925-12-11   Cancelled Treatment:    Reason Eval/Treat Not Completed: Patient at procedure or test/unavailable. Pt at endoscopy for colonoscopy. Will follow.     Blondell Reveal Kistler PT 09/13/2022  Acute Rehabilitation Services  Office [phone]

## 2022-09-13 NOTE — Anesthesia Preprocedure Evaluation (Signed)
Anesthesia Evaluation  Patient identified by MRN, date of birth, ID band Patient awake    Reviewed: Allergy & Precautions, H&P , NPO status , Patient's Chart, lab work & pertinent test results  Airway Mallampati: II  TM Distance: >3 FB Neck ROM: Full    Dental no notable dental hx.    Pulmonary neg pulmonary ROS, former smoker   Pulmonary exam normal breath sounds clear to auscultation       Cardiovascular negative cardio ROS  Rhythm:Regular Rate:Normal + Systolic murmurs  S/P AORTIC VALVE REPLACEMENT 2011        1. Left ventricular ejection fraction, by estimation, is 60 to 65%. The  left ventricle has normal function.   2. Right ventricular systolic function is normal. The right ventricular  size is normal.   3. No left atrial/left atrial appendage thrombus was detected.   4. Trivial mitral valve regurgitation.   5. There is mild thickening (0.4 x 0.4 cm) of the [place] suggest of  iinfective endocarditis. No PVL. Aortic valve regurgitation is not  visualized. There is a 23 mm Magna porcine valve valve present in the  aortic position. Procedure Date: 05/2020.   6. There is Moderate (Grade III) plaque.   Conclusion(s)/Recommendation(s): Findings are concerning for  vegetation/infective endocarditis as detailed above.   FINDINGS   Left Ventricle: Left ventricular ejection fraction, by estimation, is 60  to 65%. The left ventricle has normal function. The left ventricular  internal cavity size was normal in size.   Right Ventricle: The right ventricular size is normal. Right ventricular  systolic function is normal.   Left Atrium: No left atrial/left atrial appendage thrombus was detected.   Pericardium: There is no evidence of pericardial effusion.   Mitral Valve: Trivial mitral valve regurgitation.   Tricuspid Valve: The tricuspid valve is normal in structure. Tricuspid  valve regurgitation is trivial.   Aortic  Valve: There is mild thickening (0.4 x 0.4 cm) of the [place] suggest  of iinfective endocarditis. No PVL. Aortic valve regurgitation is not  visualized. There is a 23 mm Magna porcine valve valve present in the  aortic position. Procedure Date: 05/2020.   Pulmonic Valve: Pulmonic valve regurgitation is mild.   Aorta: There is moderate (Grade III) plaque.   IAS/Shunts: No atrial level shunt detected by color flow Doppler.      Neuro/Psych negative neurological ROS  negative psych ROS   GI/Hepatic negative GI ROS, Neg liver ROS,,,  Endo/Other  negative endocrine ROS    Renal/GU negative Renal ROS  negative genitourinary   Musculoskeletal negative musculoskeletal ROS (+)    Abdominal   Peds negative pediatric ROS (+)  Hematology  (+) Blood dyscrasia, anemia   Anesthesia Other Findings   Reproductive/Obstetrics negative OB ROS                             Anesthesia Physical Anesthesia Plan  ASA: 4  Anesthesia Plan: MAC   Post-op Pain Management: Minimal or no pain anticipated   Induction: Intravenous  PONV Risk Score and Plan: 1 and Propofol infusion and Treatment may vary due to age or medical condition  Airway Management Planned: Simple Face Mask  Additional Equipment:   Intra-op Plan:   Post-operative Plan:   Informed Consent: I have reviewed the patients History and Physical, chart, labs and discussed the procedure including the risks, benefits and alternatives for the proposed anesthesia with the patient or authorized representative who has  indicated his/her understanding and acceptance.     Dental advisory given  Plan Discussed with: CRNA and Surgeon  Anesthesia Plan Comments:        Anesthesia Quick Evaluation

## 2022-09-13 NOTE — Transfer of Care (Signed)
Immediate Anesthesia Transfer of Care Note  Patient: Jason [name]  Procedure(s) Performed: COLONOSCOPY WITH PROPOFOL BIOPSY SUBMUCOSAL TATTOO INJECTION  Patient Location: Endoscopy Unit  Anesthesia Type:MAC  Level of Consciousness: drowsy  Airway & Oxygen Therapy: Patient Spontanous Breathing and Patient connected to face mask  Post-op Assessment: Report given to RN and Post -op Vital signs reviewed and stable  Post vital signs: Reviewed and stable  Last Vitals:  Vitals Value Taken Time  BP 84/44 09/13/22 1334  Temp    Pulse 84 09/13/22 1336  Resp 8 09/13/22 1336  SpO2 89 % 09/13/22 1336  Vitals shown include unvalidated device data.  Last Pain:  Vitals:   09/13/22 1212  TempSrc: Tympanic  PainSc: 0-No pain      Patients Stated Pain Goal: 2 (91/91/66 0600)  Complications: No notable events documented.

## 2022-09-13 NOTE — Progress Notes (Signed)
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,  Bladder/urothelial cancer status post TURBT 12/2021, prostate cancer  Admit 08/29/2022 with increasing fatigue shortness of breath as well as low back pain Found to be hypotensive systolic blood pressures in the 70s hemoglobin of 8 and found to have acute blood loss anemia   blood cultures from admission showed Streptococcus infantarius,  1/6 MRI spine = decreased T1 signal increased T2 and majority of L1/L2 with endplate irregularity and erosion unchanged compared to prior MRI concerning for L1/S2 discitis osteomyelitis 1/15 TEE performed = moderate grade 3 plaque on aortic valve 1/17 colonoscopy-stricture at splenic flexure diverticulosis concerning for malignant stricture   Hospital-Problem based course  Infectious endocarditis with Streptococcus Infantarias -Either from discitis versus not MRI showed this L1-L2 - TEE showed prostatic AV thickening - Patient to get ceftriaxone 2 g twice daily for 14 days and then prolonged course 8 weeks ending 3/2 -PICC line has been placed 1/16 and should be removed post treatment  Acute blood loss anemia from AVMs and GI bleed -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 and pathology should be followed-probably not a surgical candidate - Continue clear liquids and follow  Prior CABG with aortic valve repair -Would hold any type of blood thinner at this time - Continue Crestor but consider discontinuation in outpatient setting  Chronic A-fib permanent CHADS2 score >4 -Not a candidate for anticoagulation any further - Keep on monitors but likely does  not need rate control, was on metoprolol 12.5 twice daily at home, will resume lower dose  OSA on CPAP -monmitor and resume on d/c  Back pain from discitis -Continue Oxy IR but dropped the dose to 2.5 every 4 as needed, may use tramadol every 8 as needed  Hyperlipidemia  DVT prophylaxis: SCD Code Status: DNR Family Communication: Discussed with wife at bedside she states that she thinks that she and her sons are ready for palliative care to come and he does not want any further surgeries and he is made some decline recently Disposition:  Status is: Inpatient Remains inpatient appropriate because:   Need to consolidate management   Subjective: Awake but was sleepy secondary to meds Discussed colonoscopy findings  Objective: Vitals:   09/12/22 1030 09/12/22 1100 09/12/22 1300 09/13/22 0553  BP: 1'20/64 99/61 99/62 '$ 117/65  Pulse: 97 97 92 99  Resp: '16 16 16 17  '$ Temp: 98 F (36.7 C) 98.2 F (36.8 C)  98.5 F (36.9 C)  TempSrc: Oral Oral  Oral  SpO2: 96% 92% 92% 94%  Weight:      Height:        Intake/Output Summary (Last 24 hours) at 09/13/2022 0720 Last data filed at 09/13/2022 0500 Gross per 24 hour  Intake 110.41 ml  Output 1954 ml  Net -1843.59 ml   Filed Weights   08/29/22 1305 08/29/22 2044 09/02/22 0634  Weight: 77.1 kg 69.4 kg 71 kg    Examination:  Awakens but overall is a little sleepy Somewhat malnourished Chest is clear anteriorly Sinus rhythm Abdomen soft No lower extremity edema Sacrum not examined  Data Reviewed: personally reviewed   CBC    Component Value Date/Time   WBC 14.4 (H)  09/13/2022 0314   RBC 2.87 (L) 09/13/2022 0314   HGB 7.9 (L) 09/13/2022 0314   HGB 13.7 05/09/2017 1044   HCT 25.5 (L) 09/13/2022 0314   HCT 40.7 05/09/2017 1044   PLT 194 09/13/2022 0314   PLT 143 05/09/2017 1044   MCV 88.9 09/13/2022 0314   MCV 109.4 (H) 05/09/2017 1044   MCH 27.5 09/13/2022 0314   MCHC 31.0 09/13/2022 0314   RDW 19.9 (H) 09/13/2022  0314   RDW 18.4 (H) 05/09/2017 1044   LYMPHSABS 1.3 09/13/2022 0314   LYMPHSABS 1.2 05/09/2017 1044   MONOABS 1.2 (H) 09/13/2022 0314   MONOABS 0.6 05/09/2017 1044   EOSABS 0.0 09/13/2022 0314   EOSABS 0.3 05/09/2017 1044   BASOSABS 0.1 09/13/2022 0314   BASOSABS 0.0 05/09/2017 1044      Latest Ref Rng & Units 09/11/2022    2:11 PM 09/08/2022    4:56 AM 09/07/2022    5:41 AM  CMP  Glucose 70 - 99 mg/dL 107  97  105   BUN 8 - 23 mg/dL '12  8  11   '$ Creatinine 0.61 - 1.24 mg/dL 0.84  0.69  0.74   Sodium 135 - 145 mmol/L 133  133  134   Potassium 3.5 - 5.1 mmol/L 3.8  3.7  3.1   Chloride 98 - 111 mmol/L 96  99  100   CO2 22 - 32 mmol/L '28  27  27   '$ Calcium 8.9 - 10.3 mg/dL 8.0  7.8  7.8      Radiology Studies: DG CHEST PORT 1 VIEW  Result Date: 09/12/2022 CLINICAL DATA:  PICC line placement. EXAM: PORTABLE CHEST 1 VIEW COMPARISON:  Chest radiograph 09/09/2022. FINDINGS: 1101 hours. Interval insertion of a right upper extremity PICC with tip projecting over the lower SVC. Intact median sternotomy wires and aortic valve replacement. Decreased bilateral pleural effusion. Improved aeration of the left lung base with persistent retrocardiac opacity, likely atelectasis. Stable cardiomegaly and mild interstitial prominence, most consistent with pulmonary edema. Unchanged gaseous distention of the bowel. IMPRESSION: 1. PICC tip projects over the upper SVC. 2. Decreased pleural effusions with persistent mild pulmonary edema. Electronically Signed   By: Jason [name] M.D   On: 09/12/2022 12:08   [place] EKG SITE RITE  Result Date: 09/11/2022 If Site Rite image not attached, placement could not be confirmed due to current cardiac rhythm.  ECHO TEE  Result Date: 09/11/2022    TRANSESOPHOGEAL ECHO REPORT   Patient Name:   Jason [name] [facility] Date of Exam: 09/11/2022 Medical Rec #:  [id number]         Height:       70.0 in Accession #:    [id number]        Weight:       156.5 lb Date of Birth:  07/04/1926          BSA:          1.881 m Patient Age:    35 years          BP:           118/73 mmHg Patient Gender: M                 HR:           93 bpm. Exam Location:  Inpatient Procedure: Transesophageal Echo, Color Doppler and 3D Echo Indications:     Bacteremia  History:         Patient has prior history  of Echocardiogram examinations, most                  recent 09/04/2022. CAD, Prior CABG, Arrythmias:Atrial                  Fibrillation; Risk Factors:Sleep Apnea and Dyslipidemia. Strep                  infantarius bacteremia.                  Aortic Valve: 23 mm Magna porcine valve valve is present in the                  aortic position. Procedure Date: 05/2020.  Sonographer:     Darlina Sicilian RDCS Referring Phys:  [id number] Margie Billet Diagnosing Phys: [place] PROCEDURE: After discussion of the risks and benefits of a TEE, an informed consent was obtained from the patient. TEE procedure time was 12 minutes. The transesophogeal probe was passed without difficulty through the esophogus of the patient. Imaged were obtained with the patient in a left lateral decubitus position. Sedation performed by different physician. The patient was monitored while under deep sedation. Anesthestetic sedation was provided intravenously by Anesthesiology: 144.'73mg'$  of Propofol. Image quality was good. The patient's vital signs; including heart rate, blood pressure, and oxygen saturation; remained stable throughout the procedure. The patient developed no complications during the procedure.  IMPRESSIONS  1. Left ventricular ejection fraction, by estimation, is 60 to 65%. The left ventricle has normal function.  2. Right ventricular systolic function is normal. The right ventricular size is normal.  3. No left atrial/left atrial appendage thrombus was detected.  4. Trivial mitral valve regurgitation.  5. There is mild thickening (0.4 x 0.4 cm) of the [place] suggest of iinfective endocarditis. No PVL. Aortic valve regurgitation is not  visualized. There is a 23 mm Magna porcine valve valve present in the aortic position. Procedure Date: 05/2020.  6. There is Moderate (Grade III) plaque. Conclusion(s)/Recommendation(s): Findings are concerning for vegetation/infective endocarditis as detailed above. FINDINGS  Left Ventricle: Left ventricular ejection fraction, by estimation, is 60 to 65%. The left ventricle has normal function. The left ventricular internal cavity size was normal in size. Right Ventricle: The right ventricular size is normal. Right ventricular systolic function is normal. Left Atrium: No left atrial/left atrial appendage thrombus was detected. Pericardium: There is no evidence of pericardial effusion. Mitral Valve: Trivial mitral valve regurgitation. Tricuspid Valve: The tricuspid valve is normal in structure. Tricuspid valve regurgitation is trivial. Aortic Valve: There is mild thickening (0.4 x 0.4 cm) of the [place] suggest of iinfective endocarditis. No PVL. Aortic valve regurgitation is not visualized. There is a 23 mm Magna porcine valve valve present in the aortic position. Procedure Date: 05/2020. Pulmonic Valve: Pulmonic valve regurgitation is mild. Aorta: There is moderate (Grade III) plaque. IAS/Shunts: No atrial level shunt detected by color flow Doppler. Phineas Inches Electronically signed by Phineas Inches Signature Date/Time: 09/11/2022/12:50:54 PM    Final      Scheduled Meds:  amitriptyline  25 mg Oral QHS   brimonidine  1 drop Both Eyes BID   calcium-vitamin D  1 tablet Oral Daily   Chlorhexidine Gluconate Cloth  6 each Topical Daily   cholecalciferol  1,000 Units Oral Daily   cyanocobalamin  1,000 mcg Oral Daily   dorzolamide-timolol  1 drop Both Eyes BID   feeding supplement  237 mL Oral TID BM   gabapentin  300 mg Oral  QID   latanoprost  1 drop Both Eyes QHS   lidocaine  1 patch Transdermal Q24H   multivitamin with minerals  1 tablet Oral Daily   pantoprazole  40 mg Oral BID AC   perphenazine  2 mg Oral  Once per day on Mon Wed Fri   prednisoLONE acetate  1 drop Right Eye Daily   psyllium  1 packet Oral Daily   rosuvastatin  5 mg Oral Q M,W,F   sodium chloride flush  10-40 mL Intracatheter Q12H   sucralfate  1 g Oral BID   Continuous Infusions:  sodium chloride 20 mL/hr at 09/13/22 0600   cefTRIAXone (ROCEPHIN)  IV 2 g (09/12/22 2110)   [START ON 09/19/2022] cefTRIAXone (ROCEPHIN)  IV       LOS: 15 days   Time spent: [place], MD [facility] To contact the attending provider between 7A-7P or the covering provider during after hours 7P-7A, please log into the web site [email or web address] and access using universal [place] password for that web site. If you do not have the password, please call the hospital operator.  09/13/2022, 7:20 AM

## 2022-09-14 DIAGNOSIS — C189 Malignant neoplasm of colon, unspecified: Secondary | ICD-10-CM

## 2022-09-14 DIAGNOSIS — A491 Streptococcal infection, unspecified site: Secondary | ICD-10-CM | POA: Diagnosis not present

## 2022-09-14 LAB — CBC WITH DIFFERENTIAL/PLATELET
Abs Immature Granulocytes: 0.07 10*3/uL (ref 0.00–0.07)
Basophils Absolute: 0.1 10*3/uL (ref 0.0–0.1)
Basophils Relative: 1 %
Eosinophils Absolute: 0 10*3/uL (ref 0.0–0.5)
Eosinophils Relative: 0 %
HCT: 24.5 % — ABNORMAL LOW (ref 39.0–52.0)
Hemoglobin: 7.4 g/dL — ABNORMAL LOW (ref 13.0–17.0)
Immature Granulocytes: 1 %
Lymphocytes Relative: 9 %
Lymphs Abs: 1.1 10*3/uL (ref 0.7–4.0)
MCH: 27 pg (ref 26.0–34.0)
MCHC: 30.2 g/dL (ref 30.0–36.0)
MCV: 89.4 fL (ref 80.0–100.0)
Monocytes Absolute: 0.9 10*3/uL (ref 0.1–1.0)
Monocytes Relative: 7 %
Neutro Abs: 10.6 10*3/uL — ABNORMAL HIGH (ref 1.7–7.7)
Neutrophils Relative %: 82 %
Platelets: 190 10*3/uL (ref 150–400)
RBC: 2.74 MIL/uL — ABNORMAL LOW (ref 4.22–5.81)
RDW: 19.9 % — ABNORMAL HIGH (ref 11.5–15.5)
WBC: 12.8 10*3/uL — ABNORMAL HIGH (ref 4.0–10.5)
nRBC: 0 % (ref 0.0–0.2)

## 2022-09-14 LAB — SURGICAL PATHOLOGY

## 2022-09-14 MED ORDER — CHLORHEXIDINE GLUCONATE CLOTH 2 % EX PADS: 6.0000 | MEDICATED_PAD | Freq: Every day | CUTANEOUS | Status: DC

## 2022-09-14 NOTE — Progress Notes (Signed)
Patient ID: Jason [name], male   DOB: 02/07/1926, 89 y.o.   MRN: [id number]    Progress Note   Subjective   Day #3 CC: heme positive stool , anemia, strep Bovis bacteremia  EGD this admission- gastric ulcers, 3 AVM's in stomach treated with APC, one AVM in duodenum treated with APC  Flex yesterday -malignant appearing stenosis at splenic flexure , not traversed- Bx done - pending   Labs=- WBC 12.8, hgb 7.4 drifted   Patient is sitting up in the chair, wife at bedside has been having a lot of back pain, was able to tolerate clear liquids today no complaints of abdominal pain and the wife reports that he did have some blood in his stool last evening  I reviewed the sigmoidoscopy findings with the patient, he says that he does not think that he would be agreeable to any more surgery    Objective   Vital signs in last 24 hours: Temp:  [97.2 F (36.2 C)-98.8 F (37.1 C)] 97.9 F (36.6 C) (01/18 4496) Pulse Rate:  [87-102] 95 (01/18 0633) Resp:  [10-18] 17 (01/18 0633) BP: (84-131)/(44-87) 119/78 (01/18 7591) SpO2:  [92 %-100 %] 99 % (01/18 6384) Last BM Date : 09/12/22 General:    Very elderly white male in NAD, pale Heart:  Regular rate and rhythm; no murmurs Lungs: Respirations even and unlabored, lungs CTA bilaterally Abdomen:  Soft, nontender and nondistended. Normal bowel sounds. Extremities:  Without edema. Neurologic:  Alert and oriented,  grossly normal neurologically. Psych:  Cooperative. Normal mood and affect.  Intake/Output from previous day: 01/17 0701 - 01/18 0700 In: 600 [I.V.:300; IV Piggyback:300] Out: 1300 [Urine:1300] Intake/Output this shift: Total I/O In: 360 [P.O.:360] Out: -   Lab Results: Recent Labs    09/12/22 0516 09/13/22 0314 09/14/22 0421  WBC 10.9* 14.4* 12.8*  HGB 8.8* 7.9* 7.4*  HCT 29.5* 25.5* 24.5*  PLT 215 194 190   BMET Recent Labs    09/11/22 1411  NA 133*  K 3.8  CL 96*  CO2 28  GLUCOSE 107*  BUN 12  CREATININE  0.84  CALCIUM 8.0*   LFT No results for input(s): "PROT", "ALBUMIN", "AST", "ALT", "ALKPHOS", "BILITOT", "BILIDIR", "IBILI" in the last 72 hours. PT/INR No results for input(s): "LABPROT", "INR" in the last 72 hours.  Studies/Results: DG CHEST PORT 1 VIEW  Result Date: 09/12/2022 CLINICAL DATA:  PICC line placement. EXAM: PORTABLE CHEST 1 VIEW COMPARISON:  Chest radiograph 09/09/2022. FINDINGS: 1101 hours. Interval insertion of a right upper extremity PICC with tip projecting over the lower SVC. Intact median sternotomy wires and aortic valve replacement. Decreased bilateral pleural effusion. Improved aeration of the left lung base with persistent retrocardiac opacity, likely atelectasis. Stable cardiomegaly and mild interstitial prominence, most consistent with pulmonary edema. Unchanged gaseous distention of the bowel. IMPRESSION: 1. PICC tip projects over the upper SVC. 2. Decreased pleural effusions with persistent mild pulmonary edema. Electronically Signed   By: Emmit Alexanders M.D   On: 09/12/2022 12:08       Assessment / Plan:    #9 89 year old white male with strep bovis bacteremia, and infectious endocarditis  admitted 15 days ago.  Plan was to complete 14 days of ceftriaxone twice daily then prolonged course for another 6 to 8 weeks.  Sigmoidoscopy yesterday unfortunately revealed what appears to be a malignant stenosis at the splenic flexure, biopsies were taken  Patient does have previous history of colon cancer and is status post right hemicolectomy 2018  #  2 history of gastric and small bowel AVMs-status post APC earlier this admission for bleeding  #3 anemia secondary to blood loss Hemoglobin has drifted, if drifts any further will need blood transfusion  #4 atrial fibrillation-anticoagulation has been discontinued and do not plan to reintroduce  #5 status post CABG and aortic valve replacement #6 sleep apnea with CPAP use #7 discitis from strep bovis  Plan; will  try to get the biopsies rushed As do not anticipate going forward with surgery, will advance his diet and resume Ensure supplements Continue to trend hemoglobin and transfuse as indicated       Principal Problem:   Streptococcus bovis infection Active Problems:   OSA (obstructive sleep apnea)   Atrial fibrillation, chronic (HCC)   Gastric AVM   Anemia   Anemia associated with acute blood loss   GI bleed   Hx of CABG   Hypotension   Heme positive stool   AVM (arteriovenous malformation) of small bowel, acquired   History of iron deficiency   S/P AVR   Prosthetic valve endocarditis (HCC)   Diskitis   Vertebral osteomyelitis ([place])   Malnutrition of moderate degree   Colonic mass   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

## 2022-09-14 NOTE — Progress Notes (Signed)
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,  Bladder/urothelial cancer status post TURBT 12/2021, prostate cancer  Admit 08/29/2022 with increasing fatigue shortness of breath as well as low back pain Found to be hypotensive systolic blood pressures in the 70s hemoglobin of 8 and found to have acute blood loss anemia   blood cultures from admission showed Streptococcus infantarius,  1/6 MRI spine = decreased T1 signal increased T2 and majority of L1/L2 with endplate irregularity and erosion unchanged compared to prior MRI concerning for L1/S2 discitis osteomyelitis 1/15 TEE performed = moderate grade 3 plaque on aortic valve 1/17 colonoscopy-stricture at splenic flexure diverticulosis concerning for malignant stricture    Hospital-Problem based course  Infectious endocarditis with Streptococcus Infantarias -Either from discitis versus not MRI showed this L1-L2 -TEE showed prostatic AV thickening - Given underlying malignancy and overall poor prognosis with no real surgical options it has been elected to remove PICC line and stop treatment of endocarditis, family is aware of these risks  Acute blood loss anemia from AVMs and GI bleed -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 and pathology should be followed-probably not a surgical candidate - Continue clear liquids for now but advance as per GI  Prior CABG with aortic valve repair -Would hold any type of blood thinner at this time -  Discontinue Crestor Chronic A-fib permanent CHADS2 score >4 -Not a candidate for anticoagulation any further - Keep on monitors but likely does not  need rate control, was on metoprolol 12.5 twice daily at home, will resume lower dose  OSA on CPAP -monitor and resume on d/c  Back pain from discitis -Continue Oxy IR but dropped the dose to 2.5 every 4 as needed, may use tramadol every 8 as needed  Hyperlipidemia  DVT prophylaxis: SCD Code Status: DNR Family Communication: Discussed with wife at bedside and then the patient's son on the phone It has been elected to make the patient comfort care eventually on discharge and go back to facility with hospice Disposition:  Status is: Inpatient Remains inpatient appropriate because:   Likely can discharge in the morning with hospice once choice has been given at facility   Subjective: Awake coherent no distress no icterus no pallor No pain no fever no chills   Objective: Vitals:   09/13/22 1400 09/13/22 1435 09/13/22 2019 09/14/22 0633  BP: 125/64 129/81 131/87 119/78  Pulse: 99 91 100 95  Resp: '11 18 15 17  '$ Temp:  (!) 97.2 F (36.2 C) 98.8 F (37.1 C) 97.9 F (36.6 C)  TempSrc:  Oral Oral Oral  SpO2: 100% 100% 100% 99%  Weight:      Height:        Intake/Output Summary (Last 24 hours) at 09/14/2022 1419 Last data filed at 09/14/2022 1258 Gross per 24 hour  Intake 660 ml  Output 1600 ml  Net -940 ml    Filed Weights   08/29/22 1305 08/29/22 2044 09/02/22 0634  Weight: 77.1 kg 69.4 kg 71 kg    Examination:  Awakens EOMI NCAT Chest clear Abdomen soft No lower extremity edema  Data Reviewed: personally reviewed   CBC    Component Value Date/Time   WBC 12.8 (H)  09/14/2022 0421   RBC 2.74 (L) 09/14/2022 0421   HGB 7.4 (L) 09/14/2022 0421   HGB 13.7 05/09/2017 1044   HCT 24.5 (L) 09/14/2022 0421   HCT 40.7 05/09/2017 1044   PLT 190 09/14/2022 0421   PLT 143 05/09/2017 1044   MCV 89.4 09/14/2022 0421   MCV 109.4 (H) 05/09/2017 1044   MCH 27.0 09/14/2022 0421   MCHC 30.2 09/14/2022 0421   RDW 19.9 (H) 09/14/2022 0421   RDW 18.4 (H) 05/09/2017 1044    LYMPHSABS 1.1 09/14/2022 0421   LYMPHSABS 1.2 05/09/2017 1044   MONOABS 0.9 09/14/2022 0421   MONOABS 0.6 05/09/2017 1044   EOSABS 0.0 09/14/2022 0421   EOSABS 0.3 05/09/2017 1044   BASOSABS 0.1 09/14/2022 0421   BASOSABS 0.0 05/09/2017 1044      Latest Ref Rng & Units 09/11/2022    2:11 PM 09/08/2022    4:56 AM 09/07/2022    5:41 AM  CMP  Glucose 70 - 99 mg/dL 107  97  105   BUN 8 - 23 mg/dL '12  8  11   '$ Creatinine 0.61 - 1.24 mg/dL 0.84  0.69  0.74   Sodium 135 - 145 mmol/L 133  133  134   Potassium 3.5 - 5.1 mmol/L 3.8  3.7  3.1   Chloride 98 - 111 mmol/L 96  99  100   CO2 22 - 32 mmol/L '28  27  27   '$ Calcium 8.9 - 10.3 mg/dL 8.0  7.8  7.8      Radiology Studies: No results found.   Scheduled Meds:  amitriptyline  25 mg Oral QHS   brimonidine  1 drop Both Eyes BID   calcium-vitamin D  1 tablet Oral Daily   Chlorhexidine Gluconate Cloth  6 each Topical Daily   cholecalciferol  1,000 Units Oral Daily   cyanocobalamin  1,000 mcg Oral Daily   dorzolamide-timolol  1 drop Both Eyes BID   feeding supplement  237 mL Oral TID BM   gabapentin  300 mg Oral QID   latanoprost  1 drop Both Eyes QHS   lidocaine  1 patch Transdermal Q24H   multivitamin with minerals  1 tablet Oral Daily   pantoprazole  40 mg Oral BID AC   perphenazine  2 mg Oral Once per day on Mon Wed Fri   prednisoLONE acetate  1 drop Right Eye Daily   psyllium  1 packet Oral Daily   rosuvastatin  5 mg Oral Q M,W,F   sodium chloride flush  10-40 mL Intracatheter Q12H   sucralfate  1 g Oral BID   Continuous Infusions:  cefTRIAXone (ROCEPHIN)  IV 2 g (09/14/22 1137)   [START ON 09/19/2022] cefTRIAXone (ROCEPHIN)  IV       LOS: 16 days   Time spent: [place], MD [facility] To contact the attending provider between 7A-7P or the covering provider during after hours 7P-7A, please log into the web site [email or web address] and access using universal [place] password for that web site. If you  do not have the password, please call the hospital operator.  09/14/2022, 2:19 PM

## 2022-09-14 NOTE — Plan of Care (Signed)
  Problem: Clinical Measurements: Goal: Ability to maintain clinical measurements within normal limits will improve Outcome: Progressing Goal: Diagnostic test results will improve Outcome: Progressing Goal: Respiratory complications will improve Outcome: Progressing   Problem: Coping: Goal: Level of anxiety will decrease Outcome: Progressing   Problem: Safety: Goal: Ability to remain free from injury will improve Outcome: Progressing

## 2022-09-15 ENCOUNTER — Non-Acute Institutional Stay (SKILLED_NURSING_FACILITY): Payer: Medicare Other | Admitting: Nurse Practitioner

## 2022-09-15 ENCOUNTER — Encounter: Payer: Self-pay | Admitting: Nurse Practitioner

## 2022-09-15 DIAGNOSIS — I38 Endocarditis, valve unspecified: Secondary | ICD-10-CM | POA: Diagnosis not present

## 2022-09-15 DIAGNOSIS — K31811 Angiodysplasia of stomach and duodenum with bleeding: Secondary | ICD-10-CM

## 2022-09-15 DIAGNOSIS — K56699 Other intestinal obstruction unspecified as to partial versus complete obstruction: Secondary | ICD-10-CM

## 2022-09-15 DIAGNOSIS — M4647 Discitis, unspecified, lumbosacral region: Secondary | ICD-10-CM | POA: Diagnosis not present

## 2022-09-15 DIAGNOSIS — D62 Acute posthemorrhagic anemia: Secondary | ICD-10-CM

## 2022-09-15 DIAGNOSIS — T826XXA Infection and inflammatory reaction due to cardiac valve prosthesis, initial encounter: Secondary | ICD-10-CM

## 2022-09-15 DIAGNOSIS — I482 Chronic atrial fibrillation, unspecified: Secondary | ICD-10-CM

## 2022-09-15 DIAGNOSIS — A491 Streptococcal infection, unspecified site: Secondary | ICD-10-CM | POA: Diagnosis not present

## 2022-09-15 LAB — CBC WITH DIFFERENTIAL/PLATELET
Abs Immature Granulocytes: 0.03 10*3/uL (ref 0.00–0.07)
Basophils Absolute: 0.1 10*3/uL (ref 0.0–0.1)
Basophils Relative: 1 %
Eosinophils Absolute: 0 10*3/uL (ref 0.0–0.5)
Eosinophils Relative: 1 %
HCT: 24.7 % — ABNORMAL LOW (ref 39.0–52.0)
Hemoglobin: 7.4 g/dL — ABNORMAL LOW (ref 13.0–17.0)
Immature Granulocytes: 0 %
Lymphocytes Relative: 10 %
Lymphs Abs: 0.9 10*3/uL (ref 0.7–4.0)
MCH: 26.7 pg (ref 26.0–34.0)
MCHC: 30 g/dL (ref 30.0–36.0)
MCV: 89.2 fL (ref 80.0–100.0)
Monocytes Absolute: 0.7 10*3/uL (ref 0.1–1.0)
Monocytes Relative: 9 %
Neutro Abs: 6.8 10*3/uL (ref 1.7–7.7)
Neutrophils Relative %: 79 %
Platelets: 202 10*3/uL (ref 150–400)
RBC: 2.77 MIL/uL — ABNORMAL LOW (ref 4.22–5.81)
RDW: 19.7 % — ABNORMAL HIGH (ref 11.5–15.5)
WBC: 8.5 10*3/uL (ref 4.0–10.5)
nRBC: 0 % (ref 0.0–0.2)

## 2022-09-15 MED ORDER — MORPHINE SULFATE (CONCENTRATE) 20 MG/ML PO SOLN: 10.0000 mg | ORAL | 0 refills | Status: DC | PRN

## 2022-09-15 MED ORDER — LIDOCAINE 5 % EX PTCH: 1.0000 | MEDICATED_PATCH | CUTANEOUS | 0 refills | Status: AC

## 2022-09-15 NOTE — TOC Transition Note (Addendum)
Transition of Care [facility]) - CM/SW Discharge Note   Patient Details  Name: Jason [name] MRN: [id number] Date of Birth: 1926-04-14  [facility] CM/SW Contact:  Leeroy Cha, RN Phone Number: 09/15/2022, 8:43 AM Tct-wife patient is to go [facility] sn[facility].  Asked about hospice yes does want to use [facility].  Tct-katie isman at [facility]-message left for return call at [place]. Tct-katie isman at [facility] [place] message left to call back.  Spoke md- pt is to go to  snf and not friends home apartment Tcf-katie isman at [facility] [place] will go to [facility] [address]. Number to call report is [phone]. Dellie Catholic with [facility] made aware of patient. [facility] CALLED AT 1115 FOR TRANSPORT. Patient missing fl2 completed and sent to facility via the hub. Clinical Narrative:       Final next level of care: Home/Self Care Barriers to Discharge: Barriers Resolved   Patient Goals and CMS Choice CMS Medicare.gov Compare Post Acute Care list provided to:: Patient Choice offered to / list presented to : Patient  Discharge Placement                         Discharge Plan and Services Additional resources added to the After Visit Summary for     Discharge Planning Services: CM Consult                                 Social Determinants of Health (SDOH) Interventions SDOH Screenings   Food Insecurity: No Food Insecurity (09/01/2022)  Housing: Low Risk  (09/01/2022)  Transportation Needs: No Transportation Needs (09/01/2022)  Utilities: Not At Risk (09/01/2022)  Tobacco Use: Medium Risk (09/13/2022)     Readmission Risk Interventions   No data to display

## 2022-09-15 NOTE — Plan of Care (Signed)
  Problem: Coping: ?Goal: Level of anxiety will decrease ?Outcome: Progressing ?  ?Problem: Safety: ?Goal: Ability to remain free from injury will improve ?Outcome: Progressing ?  ?

## 2022-09-15 NOTE — Progress Notes (Signed)
[facility]) Hospital Liaison Note  Received request from Velva Harman, [facility] for family interest in Hospice services at [facility]. Was not able to reach out to family before discharge. ACC will follow up at [facility].   Thank you,  Zigmund Gottron RN  [facility] Liaison [phone]

## 2022-09-15 NOTE — Assessment & Plan Note (Signed)
MRI 09/02/22 discitis osteomyelitis, back pain, morphine, Amitriptyline

## 2022-09-15 NOTE — Assessment & Plan Note (Signed)
Off anticoagulation

## 2022-09-15 NOTE — Progress Notes (Signed)
Location:   SNF [facility] Room Number: 35-A Place of Service:  SNF (31) Provider: Lennie Odor Kristiann Noyce NP  [name], MD  Patient Care Team: [name], MD as PCP - General (Internal Medicine) Martinique, Peter M, MD as PCP - Cardiology (Cardiology) Constance Haw, MD as PCP - Electrophysiology (Cardiology) [name], MD as Consulting Physician (General Surgery) [name], MD as Consulting Physician (Gastroenterology) [place], Peter M, MD as Consulting Physician (Cardiology) Rana Snare, MD (Inactive) as Consulting Physician (Urology)  Extended Emergency Contact Information Primary Emergency Contact: Parrott,Nancy Address: [address] [place] Phone: [phone] Mobile Phone: [phone] Relation: Spouse Secondary Emergency Contact: [place] Phone: [phone] Mobile Phone: [phone] Relation: Son  Code Status: DNR Goals of care: Advanced Directive information    09/15/2022    4:24 PM  Advanced Directives  Does Patient Have a Medical Advance Directive? Yes  Type of Advance Directive Out of facility DNR (pink MOST or yellow form)  Does patient want to make changes to medical advance directive? No - Patient declined     Chief Complaint  Patient presents with   Hospitalization Follow-up    HPI:  Pt is a 89 y.o. male seen today for an acute visit for medication review following hospital stay.   Hospitalized 08/29/22-09/15/22 for infectious endocarditis with strep, declined antibiotic treatment. 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  GERD, on Pantoprazole    Past Medical History:  Diagnosis Date   Abnormal PSA    Adenomatous polyp of colon 2010 and before 2006   Anemia    Anxiety    Aortic stenosis    Aortic valve prosthesis present    Atrial fibrillation (HCC)    CAD (coronary artery disease)    DDD (degenerative disc disease), lumbar    Depression    Diverticulosis    Dyslipidemia    GERD (gastroesophageal reflux disease)    Glaucoma    History of blood transfusion 09/29/2016   History of kidney stones    Hx of CABG    Hypogonadism, male    Macular degeneration    OSA (obstructive sleep apnea)    Osteoarthritis    Osteopenia    Prostate CA ([place]) prostate 2007   colon dx 2018, watching psa levels, no tx yet for prostate   Past Surgical History:  Procedure Laterality Date   AORTIC VALVE REPLACEMENT  October 2011   Magna Ease pericardial tissue valve #77m   BACK SURGERY  1978   lower   BIOPSY  08/31/2022   Procedure: BIOPSY;  Surgeon: MIrving Copas, MD;  [facility];  Service: Gastroenterology;;   BIOPSY  09/13/2022   Procedure: BIOPSY;  Surgeon: GGatha Mayer MD;  [facility];  Service: Gastroenterology;;   CATARACT EXTRACTION Bilateral    COLONOSCOPY WITH PROPOFOL N/A 09/13/2022   Procedure: COLONOSCOPY WITH PROPOFOL;  Surgeon: GGatha Mayer MD;  [facility];  Service: Gastroenterology;  Laterality: N/A;   CORNEAL TRANSPLANT Right    CORONARY ARTERY BYPASS GRAFT  October 2011  LIMA to LAD   ENTEROSCOPY N/A 08/31/2022   Procedure: ENTEROSCOPY;  Surgeon: Mansouraty, Telford Nab., MD;  [facility];  Service: Gastroenterology;  Laterality: N/A;   ESOPHAGOGASTRODUODENOSCOPY N/A 09/16/2014   Procedure: ESOPHAGOGASTRODUODENOSCOPY (EGD);  Surgeon: [name], MD;  Location: [facility]COPY;  Service: Endoscopy;  Laterality: N/A;   HEMOSTASIS CLIP PLACEMENT  08/31/2022    Procedure: HEMOSTASIS CLIP PLACEMENT;  Surgeon: Irving Copas., MD;  Location: [facility];  Service: Gastroenterology;;   HOT HEMOSTASIS N/A 08/31/2022   Procedure: HOT HEMOSTASIS (ARGON PLASMA COAGULATION/BICAP);  Surgeon: Irving Copas., MD;  [facility];  Service: Gastroenterology;  Laterality: N/A;   SUBMUCOSAL TATTOO INJECTION  08/31/2022   Procedure: SUBMUCOSAL TATTOO INJECTION;  Surgeon: Irving Copas., MD;  [facility];  Service: Gastroenterology;;   SUBMUCOSAL TATTOO INJECTION  09/13/2022   Procedure: SUBMUCOSAL TATTOO INJECTION;  Surgeon: Gatha Mayer, MD;  Location: [facility];  Service: Gastroenterology;;   TEE WITHOUT CARDIOVERSION N/A 09/11/2022   Procedure: TRANSESOPHAGEAL ECHOCARDIOGRAM (TEE);  Surgeon: Janina Mayo, MD;  Location[facility];  Service: Cardiovascular;  Laterality: N/A;   TONSILLECTOMY  76 yrs ago   TRANSURETHRAL RESECTION OF BLADDER TUMOR Right 01/04/2022   Procedure: TRANSURETHRAL RESECTION OF BLADDER TUMOR (TURBT) WITH POST OPERATIVE INSTILLATION OF GEMCITABINE/ POSSIBLE RIGHT STENT PLACEMENT;  Surgeon: Ceasar Mons, MD;  Location: [facility];  Service: Urology;  Laterality: Right;    Allergies  Allergen Reactions   Xarelto [Rivaroxaban]     GI BLEED    Allergies as of 09/15/2022       Reactions   Xarelto [rivaroxaban]    GI BLEED        Medication List        Accurate as of September 15, 2022 11:59 PM. If you have any questions, ask your nurse or doctor.          acetaminophen 500 MG tablet Commonly known as: TYLENOL Take 1,000 mg by mouth in the morning and at bedtime.   amitriptyline 25 MG tablet Commonly known as: ELAVIL Take 25 mg by mouth at bedtime.   Cholecalciferol 25 MCG (1000 UT) capsule Take 1,000 Units by mouth daily.   dorzolamide-timolol 2-0.5 % ophthalmic solution Commonly known as: COSOPT Place 1 drop into both eyes 2 (two) times daily.   lidocaine 5  % Commonly known as: LIDODERM Place 1 patch onto the skin daily. Remove & Discard patch within 12 hours or as directed by MD   LORazepam 0.5 MG tablet Commonly known as: ATIVAN Take 0.5 mg by mouth every 2 (two) hours as needed for anxiety.   morphine 20 MG/ML concentrated solution Commonly known as: ROXANOL Take 0.5 mLs (10 mg total) by mouth every 3 (three) hours as needed for severe pain.   pantoprazole 40 MG tablet Commonly known as: PROTONIX Take 40 mg by mouth daily.        Review of Systems  Constitutional:  Positive for appetite change and fatigue. Negative for fever.  HENT:  Negative for congestion and voice change.   Eyes:  Positive for visual disturbance.  Respiratory:  Negative for cough, shortness of breath and wheezing.   Cardiovascular:  Negative for leg swelling.  Gastrointestinal:  Positive for abdominal pain and blood in stool. Negative for constipation, nausea and vomiting.  Genitourinary:  Negative for difficulty urinating, dysuria and hematuria.  Musculoskeletal:  Positive for arthralgias, back pain and gait problem.  Skin:  Positive for pallor.  Neurological:  Negative for  weakness and headaches.  Psychiatric/Behavioral:  Positive for confusion and sleep disturbance. The patient is nervous/anxious.     Immunization History  Administered Date(s) Administered   Influenza-Unspecified 05/10/2019, 05/18/2020   Moderna Sars-Covid-2 Vaccination 09/01/2019, 09/29/2019, 07/06/2020   Zoster Recombinat (Shingrix) 06/26/2017, 08/27/2017   There are no preventive care reminders to display for this patient.    09/08/2022    8:04 PM 09/09/2022    7:45 AM 09/09/2022   10:00 PM 09/10/2022    7:45 AM 09/10/2022   11:00 PM  Fall Risk  (RETIRED) Patient Fall Risk Level High fall risk High fall risk High fall risk High fall risk High fall risk   Functional Status Survey:    Vitals:   09/15/22 1149  BP: 128/65  Pulse: 80  Resp: 16  Temp: 97.6 F (36.4 C)  SpO2:  97%  Weight: 152 lb 11.2 oz (69.3 kg)   Body mass index is 21.91 kg/m. Physical Exam Constitutional:      Comments: tired  HENT:     Head: Normocephalic and atraumatic.     Nose: Nose normal.     Mouth/Throat:     Mouth: Mucous membranes are dry.  Eyes:     Comments: Legally blind  Cardiovascular:     Rate and Rhythm: Tachycardia present. Rhythm irregular.  Pulmonary:     Effort: Pulmonary effort is normal.     Breath sounds: No rhonchi or rales.  Abdominal:     General: Bowel sounds are normal.     Palpations: Abdomen is soft.     Tenderness: There is abdominal tenderness. There is no right CVA tenderness, left CVA tenderness, guarding or rebound.  Musculoskeletal:        General: Normal range of motion.     Cervical back: Normal range of motion and neck supple.     Right lower leg: No edema.     Left lower leg: No edema.  Skin:    General: Skin is warm and dry.     Coloration: Skin is pale.     Comments: Mid spine redness.   Neurological:     General: No focal deficit present.     Mental Status: He is alert. Mental status is at baseline.     Motor: No weakness.     Gait: Gait abnormal.     Comments: Oriented to person  Psychiatric:        Mood and Affect: Mood normal.     Labs reviewed: Recent Labs    09/07/22 0541 09/07/22 1710 09/08/22 0456 09/11/22 1411  NA 134*  --  133* 133*  K 3.1*  --  3.7 3.8  CL 100  --  99 96*  CO2 27  --  27 28  GLUCOSE 105*  --  97 107*  BUN 11  --  8 12  CREATININE 0.74  --  0.69 0.84  CALCIUM 7.8*  --  7.8* 8.0*  MG  --  2.1  --   --    Recent Labs    09/06/22 0536  AST 18  ALT 12  ALKPHOS 69  BILITOT 0.2*  PROT 5.9*  ALBUMIN 2.3*   Recent Labs    09/13/22 0314 09/14/22 0421 09/15/22 0507  WBC 14.4* 12.8* 8.5  NEUTROABS 11.8* 10.6* 6.8  HGB 7.9* 7.4* 7.4*  HCT 25.5* 24.5* 24.7*  MCV 88.9 89.4 89.2  PLT 194 190 202   No results found for: "TSH" Lab Results  Component Value Date  HGBA1C 5.5  10/17/2016   Lab Results  Component Value Date   CHOL 89 01/03/2012   HDL 37.40 (L) 01/03/2012   LDLCALC 27 01/03/2012   TRIG 125.0 01/03/2012   CHOLHDL 2 01/03/2012    Significant Diagnostic Results in last 30 days:  DG CHEST PORT 1 VIEW  Result Date: 09/12/2022 CLINICAL DATA:  PICC line placement. EXAM: PORTABLE CHEST 1 VIEW COMPARISON:  Chest radiograph 09/09/2022. FINDINGS: 1101 hours. Interval insertion of a right upper extremity PICC with tip projecting over the lower SVC. Intact median sternotomy wires and aortic valve replacement. Decreased bilateral pleural effusion. Improved aeration of the left lung base with persistent retrocardiac opacity, likely atelectasis. Stable cardiomegaly and mild interstitial prominence, most consistent with pulmonary edema. Unchanged gaseous distention of the bowel. IMPRESSION: 1. PICC tip projects over the upper SVC. 2. Decreased pleural effusions with persistent mild pulmonary edema. Electronically Signed   By: Emmit Alexanders M.D   On: 09/12/2022 12:08   [place] EKG SITE RITE  Result Date: 09/11/2022 If Site Rite image not attached, placement could not be confirmed due to current cardiac rhythm.  ECHO TEE  Result Date: 09/11/2022    TRANSESOPHOGEAL ECHO REPORT   Patient Name:   Jason [name] [facility] Date of Exam: 09/11/2022 Medical Rec #:  [id number]         Height:       70.0 in Accession #:    [id number]        Weight:       156.5 lb Date of Birth:  Oct 30, 1925         BSA:          1.881 m Patient Age:    61 years          BP:           118/73 mmHg Patient Gender: M                 HR:           93 bpm. Exam Location:  Inpatient Procedure: Transesophageal Echo, Color Doppler and 3D Echo Indications:     Bacteremia  History:         Patient has prior history of Echocardiogram examinations, most                  recent 09/04/2022. CAD, Prior CABG, Arrythmias:Atrial                  Fibrillation; Risk Factors:Sleep Apnea and Dyslipidemia. Strep                   infantarius bacteremia.                  Aortic Valve: 23 mm Magna porcine valve valve is present in the                  aortic position. Procedure Date: 05/2020.  Sonographer:     Darlina Sicilian RDCS Referring Phys:  [id number] Margie Billet Diagnosing Phys: [place] PROCEDURE: After discussion of the risks and benefits of a TEE, an informed consent was obtained from the patient. TEE procedure time was 12 minutes. The transesophogeal probe was passed without difficulty through the esophogus of the patient. Imaged were obtained with the patient in a left lateral decubitus position. Sedation performed by different physician. The patient was monitored while under deep sedation. Anesthestetic sedation was provided intravenously by Anesthesiology: 144.'73mg'$  of Propofol. Image quality was good. The patient's  vital signs; including heart rate, blood pressure, and oxygen saturation; remained stable throughout the procedure. The patient developed no complications during the procedure.  IMPRESSIONS  1. Left ventricular ejection fraction, by estimation, is 60 to 65%. The left ventricle has normal function.  2. Right ventricular systolic function is normal. The right ventricular size is normal.  3. No left atrial/left atrial appendage thrombus was detected.  4. Trivial mitral valve regurgitation.  5. There is mild thickening (0.4 x 0.4 cm) of the [place] suggest of iinfective endocarditis. No PVL. Aortic valve regurgitation is not visualized. There is a 23 mm Magna porcine valve valve present in the aortic position. Procedure Date: 05/2020.  6. There is Moderate (Grade III) plaque. Conclusion(s)/Recommendation(s): Findings are concerning for vegetation/infective endocarditis as detailed above. FINDINGS  Left Ventricle: Left ventricular ejection fraction, by estimation, is 60 to 65%. The left ventricle has normal function. The left ventricular internal cavity size was normal in size. Right Ventricle: The right ventricular size is  normal. Right ventricular systolic function is normal. Left Atrium: No left atrial/left atrial appendage thrombus was detected. Pericardium: There is no evidence of pericardial effusion. Mitral Valve: Trivial mitral valve regurgitation. Tricuspid Valve: The tricuspid valve is normal in structure. Tricuspid valve regurgitation is trivial. Aortic Valve: There is mild thickening (0.4 x 0.4 cm) of the [place] suggest of iinfective endocarditis. No PVL. Aortic valve regurgitation is not visualized. There is a 23 mm Magna porcine valve valve present in the aortic position. Procedure Date: 05/2020. Pulmonic Valve: Pulmonic valve regurgitation is mild. Aorta: There is moderate (Grade III) plaque. IAS/Shunts: No atrial level shunt detected by color flow Doppler. Phineas Inches Electronically signed by Phineas Inches Signature Date/Time: 09/11/2022/12:50:54 PM    Final    DG CHEST PORT 1 VIEW  Result Date: 09/09/2022 CLINICAL DATA:  10031 Cough 10031 EXAM: PORTABLE CHEST 1 VIEW COMPARISON:  August 29, 2022 FINDINGS: The cardiomediastinal silhouette is unchanged in contour.Status post median sternotomy and valve replacement. Small bilateral pleural effusions. No pneumothorax. Interstitial prominence and vascular congestion with bibasilar hazy opacities. Gaseous distension of bowel IMPRESSION: Constellation of findings are favored to reflect pulmonary edema with bibasilar atelectasis and small bilateral pleural effusions. Electronically Signed   By: Valentino Saxon M.D.   On: 09/09/2022 12:08   ECHOCARDIOGRAM COMPLETE  Result Date: 09/04/2022    ECHOCARDIOGRAM REPORT   Patient Name:   Jason [name] [facility] Date of Exam: 09/04/2022 Medical Rec #:  [id number]         Height:       70.0 in Accession #:    [id number]        Weight:       156.5 lb Date of Birth:  [birth year]         BSA:          1.881 m Patient Age:    57 years          BP:           97/62 mmHg Patient Gender: M                 HR:           85 bpm. Exam Location:   Inpatient Procedure: 2D Echo, Cardiac Doppler and Color Doppler Indications:    Bacteremia  History:        Patient has prior history of Echocardiogram examinations. CAD,                 Prior CABG, Aortic  Valve Disease, Arrythmias:Atrial                 Fibrillation; Risk Factors:Dyslipidemia.                 Aortic Valve: 23 mm Magna porcine valve is present in the aortic                 position. Procedure Date: 05/2010.  Sonographer:    Meagan Baucom RDCS, FE, PE Referring Phys: [place]  1. Left ventricular ejection fraction, by estimation, is 60 to 65%. The left ventricle has normal function. The left ventricle has no regional wall motion abnormalities. There is moderate left ventricular hypertrophy. Left ventricular diastolic parameters are indeterminate.  2. Right ventricular systolic function is normal. The right ventricular size is normal.  3. Left atrial size was moderately dilated.  4. Right atrial size was moderately dilated.  5. The mitral valve has been repaired/replaced. Mild to moderate mitral valve regurgitation. No evidence of mitral stenosis. Severe mitral annular calcification.  6. The aortic valve has been repaired/replaced. Aortic valve regurgitation is not visualized. No aortic stenosis is present. There is a 23 mm Magna porcine valve present in the aortic position. Procedure Date: 05/2010. Echo findings are consistent with normal structure and function of the aortic valve prosthesis. Aortic valve area, by VTI measures 1.25 cm. Aortic valve mean gradient measures 16.0 mmHg. Aortic valve Vmax measures 2.51 m/s.  7. The inferior vena cava is normal in size with greater than 50% respiratory variability, suggesting right atrial pressure of 3 mmHg. Comparison(s): No significant change from prior study. Prior images reviewed side by side. Conclusion(s)/Recommendation(s): No evidence of valvular vegetations on this transthoracic echocardiogram. Consider a transesophageal  echocardiogram to exclude infective endocarditis if clinically indicated. FINDINGS  Left Ventricle: Left ventricular ejection fraction, by estimation, is 60 to 65%. The left ventricle has normal function. The left ventricle has no regional wall motion abnormalities. The left ventricular internal cavity size was normal in size. There is  moderate left ventricular hypertrophy. Left ventricular diastolic parameters are indeterminate. Right Ventricle: The right ventricular size is normal. No increase in right ventricular wall thickness. Right ventricular systolic function is normal. Left Atrium: Left atrial size was moderately dilated. Right Atrium: Right atrial size was moderately dilated. Pericardium: There is no evidence of pericardial effusion. Mitral Valve: The mitral valve has been repaired/replaced. There is mild thickening of the mitral valve leaflet(s). Severe mitral annular calcification. Mild to moderate mitral valve regurgitation. No evidence of mitral valve stenosis. MV peak gradient, 7.2 mmHg. The mean mitral valve gradient is 3.0 mmHg. Tricuspid Valve: The tricuspid valve is normal in structure. Tricuspid valve regurgitation is mild . No evidence of tricuspid stenosis. Aortic Valve: The aortic valve has been repaired/replaced. Aortic valve regurgitation is not visualized. No aortic stenosis is present. Aortic valve mean gradient measures 16.0 mmHg. Aortic valve peak gradient measures 25.2 mmHg. Aortic valve area, by VTI measures 1.25 cm. There is a 23 mm Magna porcine valve present in the aortic position. Procedure Date: 05/2010. Echo findings are consistent with normal structure and function of the aortic valve prosthesis. Pulmonic Valve: The pulmonic valve was normal in structure. Pulmonic valve regurgitation is not visualized. No evidence of pulmonic stenosis. Aorta: The aortic root is normal in size and structure. Venous: The inferior vena cava is normal in size with greater than 50% respiratory  variability, suggesting right atrial pressure of 3 mmHg. IAS/Shunts: No atrial level shunt  detected by color flow Doppler.  LEFT VENTRICLE PLAX 2D LVIDd:         4.00 cm   Diastology LVIDs:         2.90 cm   LV e' medial:    7.62 cm/s LV PW:         1.40 cm   LV E/e' medial:  13.9 LV IVS:        1.60 cm   LV e' lateral:   9.68 cm/s LVOT diam:     2.20 cm   LV E/e' lateral: 11.0 LV SV:         63 LV SV Index:   33 LVOT Area:     3.80 cm  RIGHT VENTRICLE RV S prime:     8.70 cm/s TAPSE (M-mode): 1.6 cm LEFT ATRIUM              Index        RIGHT ATRIUM           Index LA diam:        4.10 cm  2.18 cm/m   RA Area:     26.50 cm LA Vol (A2C):   119.0 ml 63.27 ml/m  RA Volume:   89.40 ml  47.53 ml/m LA Vol (A4C):   92.3 ml  49.07 ml/m LA Biplane Vol: 105.0 ml 55.83 ml/m  AORTIC VALVE AV Area (Vmax):    1.33 cm AV Area (Vmean):   1.20 cm AV Area (VTI):     1.25 cm AV Vmax:           251.00 cm/s AV Vmean:          188.000 cm/s AV VTI:            0.503 m AV Peak Grad:      25.2 mmHg AV Mean Grad:      16.0 mmHg LVOT Vmax:         87.50 cm/s LVOT Vmean:        59.300 cm/s LVOT VTI:          0.165 m LVOT/AV VTI ratio: 0.33  AORTA Ao Root diam: 3.60 cm Ao Asc diam:  3.20 cm MITRAL VALVE                TRICUSPID VALVE MV Area (PHT): 4.06 cm     TR Peak grad:   38.4 mmHg MV Area VTI:   2.43 cm     TR Vmax:        310.00 cm/s MV Peak grad:  7.2 mmHg MV Mean grad:  3.0 mmHg     SHUNTS MV Vmax:       1.34 m/s     Systemic VTI:  0.16 m MV Vmean:      79.6 cm/s    Systemic Diam: 2.20 cm MV Decel Time: 187 msec MV E velocity: 106.00 cm/s Candee Furbish MD Electronically signed by Candee Furbish MD Signature Date/Time: 09/04/2022/11:53:44 AM    Final    CT MAXILLOFACIAL W CONTRAST  Result Date: 09/03/2022 CLINICAL DATA:  Concern for sublingual/submandibular abscess EXAM: CT MAXILLOFACIAL WITH CONTRAST TECHNIQUE: Multidetector CT imaging of the maxillofacial structures was performed with intravenous contrast. Multiplanar CT image  reconstructions were also generated. RADIATION DOSE REDUCTION: This exam was performed according to the departmental dose-optimization program which includes automated exposure control, adjustment of the mA and/or kV according to patient size and/or use of iterative reconstruction technique. CONTRAST:  127m OMNIPAQUE IOHEXOL 300 MG/ML  SOLN  COMPARISON:  None Available. FINDINGS: Evaluation is somewhat limited by beam hardening artifact from extensive dental hardware. Osseous: No fracture or mandibular dislocation. No destructive process. Evaluation for periapical lucencies is limited by aforementioned beam hardening artifact. There is extension of the root of the left canine dental implant through the anterior aspect of the maxilla (series 8, image 58 and series 4, image 43), of unknown clinical significance. Orbits: Negative. No traumatic or inflammatory finding. Status post bilateral lens replacements. Sinuses: Near complete opacification of the bilateral maxillary sinuses, with additional opacification of the right greater than left ethmoid air cells and frontal sinuses. The mastoids are well aerated. Soft tissues: Normal appearance of the parotid and submandibular glands. No evidence of soft tissue collection. Limited intracranial: No significant or unexpected finding. IMPRESSION: 1. Evaluation is somewhat limited by beam hardening artifact from extensive dental hardware. 2. Within the above limitation, extension of the root of the left canine dental implant through the anterior aspect of the maxilla, of unknown clinical significance. 3. No evidence of soft tissue collection. No evidence of sublingual or submandibular abscess 4. Near complete opacification of the bilateral maxillary sinuses, with additional opacification of the right greater than left ethmoid air cells and frontal sinuses. Correlate for sinusitis. Electronically Signed   By: Merilyn Baba M.D.   On: 09/03/2022 19:12   MR LUMBAR SPINE WO  CONTRAST  Result Date: 09/02/2022 CLINICAL DATA:  Low back pain, bacteremic EXAM: MRI LUMBAR SPINE WITHOUT CONTRAST TECHNIQUE: Multiplanar, multisequence MR imaging of the lumbar spine was performed. No intravenous contrast was administered. COMPARISON:  08/07/2022 MRI lumbar spine, correlation is also made with 08/30/2022 CT abdomen pelvis and 10/04/2021 CT chest abdomen pelvis FINDINGS: Segmentation: 5 lumbar type vertebral bodies. Congenital ankylosis of T12 and L1. Alignment: Trace retrolisthesis of L1 on L2, L2 on L3, L3 on L4, and L5 on S1. Trace anterolisthesis of L4 on L5. Vertebrae: Redemonstrated decreased T1 signal and increased T2 signal in the majority of L1 and L2, similar to the prior exam. Increased T2 signal at the L1-L2 disc space. The endplate irregularity and erosion appears grossly unchanged compared to a prior MRI, and the 08/30/2022 CT appears unchanged compared to the 10/04/2021 CT. No acute fracture or suspicious osseous lesion. Increased T2 signal is also noted in the disc spaces at T11-T12, L2-L3, L3-L4 and L5-S1, without associated marrow abnormality. The Conus medullaris and cauda equina: Conus extends to the L1 level. The conus and cauda equina appear to closely approximate the dorsal aspect of the T12 and L1 vertebral body, and do not layer dependently, possibly adherent to theca (series 5, image 7. More inferiorly, there is clumping of the nerve roots (series 5, image 18). Both findings are concerning for arachnoiditis. No evidence of epidural collection. Paraspinal and other soft tissues: Edema at the medial aspect of the bilateral psoas muscles (series 5, image 17), most prominently at L2. No focal collection within the musculature. Increased T2 signal anterior to T12-L2, possibly inflammatory changes, without a focal T1 hypointense collection to suggest phlegmon. Disc levels: T12-L1: No significant disc bulge. No spinal canal stenosis or neural foraminal narrowing. L1-L2: Trace  retrolisthesis and minimal disc bulge. No spinal canal stenosis or neural foraminal narrowing. L2-L3: Trace retrolisthesis with disc osteophyte complex. Mild facet arthropathy. Narrowing of the lateral recesses. No spinal canal stenosis or neural foraminal narrowing. L3-L4: Trace retrolisthesis. Mild facet arthropathy. No spinal canal stenosis or neural foraminal narrowing. L4-L5: Trace anterolisthesis. Moderate facet arthropathy. Ligamentum flavum hypertrophy. Narrowing of  the lateral recesses. Mild spinal canal stenosis. Mild right neural foraminal narrowing. L5-S1: Mild disc bulge. Moderate right and mild left facet arthropathy. Prior laminectomy. No spinal canal stenosis. Severe right and moderate left neural foraminal narrowing. IMPRESSION: 1. Fluid signal throughout the L1-L2 disc space, which appears increased compared to 08/07/2022, with similar T2 hyperintense signal and erosive changes in the adjacent vertebral bodies, concerning for L1-L2 discitis/osteomyelitis. No evidence of epidural collection. 2. Increased T2 signal in the disc spaces at T11-T12, L2-L3, L3-L4, and L5-S1, without associated marrow abnormality, which is favored to be degenerative in nature. Attention on follow-up 3. Edema at the medial aspect of the bilateral psoas muscles, most prominently at L2, without focal collection, likely reactive. 4. The conus and cauda equina are adherent to the thecal sac in the upper lumbar spine and appear clumped in the lower lumbar spine, concerning for arachnoiditis. These results will be called to the ordering clinician or representative by the Radiologist Assistant, and communication documented in the PACS or [employer]. Electronically Signed   By: Merilyn Baba M.D.   On: 09/02/2022 23:13   CT ABDOMEN PELVIS W CONTRAST  Result Date: 08/30/2022 CLINICAL DATA:  Metastatic disease evaluation, history of prostate cancer * Tracking Code: BO * EXAM: CT ABDOMEN AND PELVIS WITH CONTRAST TECHNIQUE:  Multidetector CT imaging of the abdomen and pelvis was performed using the standard protocol following bolus administration of intravenous contrast. RADIATION DOSE REDUCTION: This exam was performed according to the departmental dose-optimization program which includes automated exposure control, adjustment of the mA and/or kV according to patient size and/or use of iterative reconstruction technique. CONTRAST:  173m OMNIPAQUE IOHEXOL 300 MG/ML  SOLN COMPARISON:  CT chest abdomen pelvis, 10/04/2021 FINDINGS: Lower chest: Cardiomegaly and coronary artery calcifications. Small bilateral pleural effusions. Hepatobiliary: No solid liver abnormality is seen. No gallstones, gallbladder wall thickening, or biliary dilatation. Pancreas: Unremarkable. No pancreatic ductal dilatation or surrounding inflammatory changes. Spleen: Normal in size without significant abnormality. Adrenals/Urinary Tract: Adrenal glands are unremarkable. Kidneys are normal, without renal calculi, solid lesion, or hydronephrosis. Bladder is unremarkable. Stomach/Bowel: Stomach is within normal limits. Status post right hemicolectomy and reanastomosis. No evidence of bowel wall thickening, distention, or inflammatory changes. Descending and sigmoid diverticulosis. Vascular/Lymphatic: Aortic atherosclerosis. No enlarged abdominal or pelvic lymph nodes. Reproductive: Prostatomegaly. Other: No abdominal wall hernia or abnormality. Unchanged focus of nonacute fat necrosis in the right lower quadrant (series 2, image 60). No ascites. Musculoskeletal: No acute osseous findings. Unchanged severe, sclerotic disc degenerative disease and endplate destruction of L1-L2 (series 6, image 66). IMPRESSION: 1. No evidence of lymphadenopathy or metastatic disease in the abdomen or pelvis. 2. A previously described right ureterovesicular junction soft tissue nodule is no longer seen, possibly resected. 3. Status post right hemicolectomy and reanastomosis. 4.  Descending and sigmoid diverticulosis without evidence of acute diverticulitis. 5. Prostatomegaly without discretely visualized mass. 6. Cardiomegaly and coronary artery disease. 7. Small bilateral pleural effusions. Aortic Atherosclerosis (ICD10-I70.0). Electronically Signed   By: ADelanna AhmadiM.D.   On: 08/30/2022 13:29   DG Chest 2 View  Result Date: 08/29/2022 CLINICAL DATA:  Shortness of breath EXAM: CHEST - 2 VIEW COMPARISON:  07/15/2010 chest radiograph and 10/04/2021 CT FINDINGS: Cardiomegaly and aortic valve replacement again noted. Elevation of LEFT hemidiaphragm and minimal LEFT basilar scarring/atelectasis again noted. There is no evidence of focal airspace disease, pulmonary edema, suspicious pulmonary nodule/mass, pleural effusion, or pneumothorax. No acute bony abnormalities are identified. IMPRESSION: Cardiomegaly without evidence of acute cardiopulmonary disease.  Electronically Signed   By: Margarette Canada M.D.   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 Had TEE    Family/ staff Communication: plan of care reviewed with the patient and charge nurse.   Labs/tests ordered:  none  Time spend 35 minutes.

## 2022-09-15 NOTE — Assessment & Plan Note (Signed)
on Pantoprazole

## 2022-09-15 NOTE — Assessment & Plan Note (Addendum)
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

## 2022-09-15 NOTE — TOC Transition Note (Deleted)
Transition of Care [facility]) - CM/SW Discharge Note   Patient Details  Name: Jason [name] MRN: [id number] Date of Birth: 02-20-1926  [facility] CM/SW Contact:  Leeroy Cha, RN Phone Number: 09/15/2022, 8:40 AM   Clinical Narrative:    Pt discharged to home with self care.   Final next level of care: Home/Self Care Barriers to Discharge: Barriers Resolved   Patient Goals and CMS Choice CMS Medicare.gov Compare Post Acute Care list provided to:: Patient Choice offered to / list presented to : Patient  Discharge Placement                         Discharge Plan and Services Additional resources added to the After Visit Summary for     Discharge Planning Services: CM Consult                                 Social Determinants of Health (SDOH) Interventions SDOH Screenings   Food Insecurity: No Food Insecurity (09/01/2022)  Housing: Low Risk  (09/01/2022)  Transportation Needs: No Transportation Needs (09/01/2022)  Utilities: Not At Risk (09/01/2022)  Tobacco Use: Medium Risk (09/13/2022)     Readmission Risk Interventions   No data to display

## 2022-09-15 NOTE — Discharge Summary (Addendum)
Physician Discharge Summary  Jason [name] NVB:[id number] DOB: Jun 07, 1986 DOA: 08/29/2022  PCP: Jason Infante, MD  Admit date: 08/29/2022 Discharge date: 09/15/2022  Time spent: 27 minutes  Recommendations for Outpatient Follow-up:  Patient going back to SNF with hospice following  All meds have been consolidated and simplified to only essentials  Discharge Diagnoses:  MAIN problem for hospitalization   Bacteremia and infectious endocarditis with Streptococcus  Acute blood loss anemia from AVMs likely malignant stricture discitis causing pain Chronic atrial fibrillation  Please see below for itemized issues addressed in HOpsital- refer to other progress notes for clarity if needed  Discharge Condition: fair  Diet recommendation: Liberalize diet  Filed Weights   08/29/22 1305 08/29/22 2044 09/02/22 0634  Weight: 77.1 kg 69.4 kg 71 kg    History of present illness:  89 year old home dwelling white male (legally blind) Known history AVMs of the stomach small bowel Aortic stenosis with aortic valve replacement in the past, CAD with CABG, A-fib on Eliquis CHADVASC >4 OSA/CPAP Reflux 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,  Bladder/urothelial cancer status post TURBT 12/2021, prostate cancer   Admit 08/29/2022 with increasing fatigue shortness of breath as well as low back pain Found to be hypotensive systolic blood pressures in the 70s hemoglobin of 8 and found to have acute blood loss anemia    blood cultures from admission showed Streptococcus infantarius,  1/6 MRI spine = decreased T1 signal increased T2 and majority of L1/L2 with endplate irregularity and erosion unchanged compared to prior MRI concerning for L1/S2 discitis osteomyelitis 1/15 TEE performed = moderate grade 3 plaque on aortic valve 1/17 colonoscopy-stricture at splenic flexure diverticulosis concerning for malignant stricture  Hospital Course:  nfectious endocarditis with Streptococcus  Infantarias -Either from discitis versus not MRI showed this L1-L2 -TEE showed prostatic AV thickening - Given underlying malignancy and overall poor prognosis with no real surgical options it has been elected to  stop treatment of endocarditis, family ihas a good idea that stroke may ensue   Acute blood loss anemia from AVMs and GI bleed -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 and pathology should be followed-probably not a surgical candidate - liberalize diet to whatever wishes--wouldn't control   Prior CABG with aortic valve repair -Would hold any type of blood thinner at this time -Discontinue Crestor  Chronic A-fib permanent CHADS2 score >4 -Not a candidate for anticoagulation any further   OSA on CPAP -monitor and resume on d/c   Back pain from discitis -patient changed to Roxanol on discharge for pain    Discharge Exam: Vitals:   09/14/22 2122 09/15/22 0638  BP: 129/83 120/62  Pulse: 100 93  Resp: 18 20  Temp: (!) 97.5 F (36.4 C) 98.2 F (36.8 C)  SpO2: 93% 93%    Subj on day of d/c   Awake coherent no distress EOMI NCAT no focal deficit Some back pain with movement this is at baseline   Discharge Instructions   Discharge Instructions     Diet - low sodium heart healthy   Complete by: As directed    Increase activity slowly   Complete by: As directed    No wound care   Complete by: As directed       Allergies as of 09/15/2022       Reactions   Xarelto [rivaroxaban]    GI BLEED        Medication List     STOP taking these medications    apixaban 5 MG Tabs  tablet Commonly known as: ELIQUIS   brimonidine 0.2 % ophthalmic solution Commonly known as: ALPHAGAN   calcium-vitamin D 500-200 MG-UNIT tablet   cyanocobalamin 1000 MCG tablet Commonly known as: VITAMIN B12   diclofenac Sodium 1 % Gel Commonly known as: VOLTAREN   gabapentin 300 MG capsule Commonly known as: NEURONTIN   ipratropium 0.06 % nasal spray Commonly known as:  ATROVENT   latanoprost 0.005 % ophthalmic solution Commonly known as: XALATAN   metoprolol tartrate 25 MG tablet Commonly known as: LOPRESSOR   multivitamin with minerals Tabs tablet   perphenazine 2 MG tablet Commonly known as: TRILAFON   prednisoLONE acetate 1 % ophthalmic suspension Commonly known as: PRED FORTE   psyllium 58.6 % powder Commonly known as: METAMUCIL   rosuvastatin 5 MG tablet Commonly known as: CRESTOR   tamsulosin 0.4 MG Caps capsule Commonly known as: FLOMAX   traMADol 50 MG tablet Commonly known as: ULTRAM       TAKE these medications    acetaminophen 500 MG tablet Commonly known as: TYLENOL Take 1,000 mg by mouth in the morning and at bedtime.   amitriptyline 25 MG tablet Commonly known as: ELAVIL Take 25 mg by mouth at bedtime.   Cholecalciferol 25 MCG (1000 UT) capsule Take 1,000 Units by mouth daily.   dorzolamide-timolol 2-0.5 % ophthalmic solution Commonly known as: COSOPT Place 1 drop into both eyes 2 (two) times daily.   lidocaine 5 % Commonly known as: LIDODERM Place 1 patch onto the skin daily. Remove & Discard patch within 12 hours or as directed by MD   morphine 20 MG/ML concentrated solution Commonly known as: ROXANOL Take 0.5 mLs (10 mg total) by mouth every 3 (three) hours as needed for severe pain.   pantoprazole 40 MG tablet Commonly known as: PROTONIX Take 40 mg by mouth daily.       Allergies  Allergen Reactions   Xarelto [Rivaroxaban]     GI BLEED    Contact information for after-discharge care     Destination     [facility] .   Service: Skilled Nursing Contact information: [place] [phone]                      The results of significant diagnostics from this hospitalization (including imaging, microbiology, ancillary and laboratory) are listed below for reference.    Significant Diagnostic Studies: DG CHEST  PORT 1 VIEW  Result Date: 09/12/2022 CLINICAL DATA:  PICC line placement. EXAM: PORTABLE CHEST 1 VIEW COMPARISON:  Chest radiograph 09/09/2022. FINDINGS: 1101 hours. Interval insertion of a right upper extremity PICC with tip projecting over the lower SVC. Intact median sternotomy wires and aortic valve replacement. Decreased bilateral pleural effusion. Improved aeration of the left lung base with persistent retrocardiac opacity, likely atelectasis. Stable cardiomegaly and mild interstitial prominence, most consistent with pulmonary edema. Unchanged gaseous distention of the bowel. IMPRESSION: 1. PICC tip projects over the upper SVC. 2. Decreased pleural effusions with persistent mild pulmonary edema. Electronically Signed   By: Emmit Alexanders M.D   On: 09/12/2022 12:08   [place] EKG SITE RITE  Result Date: 09/11/2022 If Site Rite image not attached, placement could not be confirmed due to current cardiac rhythm.  ECHO TEE  Result Date: 09/11/2022    TRANSESOPHOGEAL ECHO REPORT   Patient Name:   Jason [name] [facility] Date of Exam: 09/11/2022 Medical Rec #:  [id number]  Height:       70.0 in Accession #:    [id number]        Weight:       156.5 lb Date of Birth:  1925/12/09         BSA:          1.881 m Patient Age:    84 years          BP:           118/73 mmHg Patient Gender: M                 HR:           93 bpm. Exam Location:  Inpatient Procedure: Transesophageal Echo, Color Doppler and 3D Echo Indications:     Bacteremia  History:         Patient has prior history of Echocardiogram examinations, most                  recent 09/04/2022. CAD, Prior CABG, Arrythmias:Atrial                  Fibrillation; Risk Factors:Sleep Apnea and Dyslipidemia. Strep                  infantarius bacteremia.                  Aortic Valve: 23 mm Magna porcine valve valve is present in the                  aortic position. Procedure Date: 05/2020.  Sonographer:     Darlina Sicilian RDCS Referring Phys:  [id number] Margie Billet  Diagnosing Phys: [place] PROCEDURE: After discussion of the risks and benefits of a TEE, an informed consent was obtained from the patient. TEE procedure time was 12 minutes. The transesophogeal probe was passed without difficulty through the esophogus of the patient. Imaged were obtained with the patient in a left lateral decubitus position. Sedation performed by different physician. The patient was monitored while under deep sedation. Anesthestetic sedation was provided intravenously by Anesthesiology: 144.'73mg'$  of Propofol. Image quality was good. The patient's vital signs; including heart rate, blood pressure, and oxygen saturation; remained stable throughout the procedure. The patient developed no complications during the procedure.  IMPRESSIONS  1. Left ventricular ejection fraction, by estimation, is 60 to 65%. The left ventricle has normal function.  2. Right ventricular systolic function is normal. The right ventricular size is normal.  3. No left atrial/left atrial appendage thrombus was detected.  4. Trivial mitral valve regurgitation.  5. There is mild thickening (0.4 x 0.4 cm) of the [place] suggest of iinfective endocarditis. No PVL. Aortic valve regurgitation is not visualized. There is a 23 mm Magna porcine valve valve present in the aortic position. Procedure Date: 05/2020.  6. There is Moderate (Grade III) plaque. Conclusion(s)/Recommendation(s): Findings are concerning for vegetation/infective endocarditis as detailed above. FINDINGS  Left Ventricle: Left ventricular ejection fraction, by estimation, is 60 to 65%. The left ventricle has normal function. The left ventricular internal cavity size was normal in size. Right Ventricle: The right ventricular size is normal. Right ventricular systolic function is normal. Left Atrium: No left atrial/left atrial appendage thrombus was detected. Pericardium: There is no evidence of pericardial effusion. Mitral Valve: Trivial mitral valve regurgitation.  Tricuspid Valve: The tricuspid valve is normal in structure. Tricuspid valve regurgitation is trivial. Aortic Valve: There is mild thickening (0.4 x 0.4 cm) of the [place] suggest of  iinfective endocarditis. No PVL. Aortic valve regurgitation is not visualized. There is a 23 mm Magna porcine valve valve present in the aortic position. Procedure Date: 05/2020. Pulmonic Valve: Pulmonic valve regurgitation is mild. Aorta: There is moderate (Grade III) plaque. IAS/Shunts: No atrial level shunt detected by color flow Doppler. Phineas Inches Electronically signed by Phineas Inches Signature Date/Time: 09/11/2022/12:50:54 PM    Final    DG CHEST PORT 1 VIEW  Result Date: 09/09/2022 CLINICAL DATA:  10031 Cough 10031 EXAM: PORTABLE CHEST 1 VIEW COMPARISON:  August 29, 2022 FINDINGS: The cardiomediastinal silhouette is unchanged in contour.Status post median sternotomy and valve replacement. Small bilateral pleural effusions. No pneumothorax. Interstitial prominence and vascular congestion with bibasilar hazy opacities. Gaseous distension of bowel IMPRESSION: Constellation of findings are favored to reflect pulmonary edema with bibasilar atelectasis and small bilateral pleural effusions. Electronically Signed   By: Valentino Saxon M.D.   On: 09/09/2022 12:08   ECHOCARDIOGRAM COMPLETE  Result Date: 09/04/2022    ECHOCARDIOGRAM REPORT   Patient Name:   Jason [name] [facility] Date of Exam: 09/04/2022 Medical Rec #:  [id number]         Height:       70.0 in Accession #:    [id number]        Weight:       156.5 lb Date of Birth:  1926-05-25         BSA:          1.881 m Patient Age:    89 years          BP:           97/62 mmHg Patient Gender: M                 HR:           85 bpm. Exam Location:  Inpatient Procedure: 2D Echo, Cardiac Doppler and Color Doppler Indications:    Bacteremia  History:        Patient has prior history of Echocardiogram examinations. CAD,                 Prior CABG, Aortic Valve Disease, Arrythmias:Atrial                  Fibrillation; Risk Factors:Dyslipidemia.                 Aortic Valve: 23 mm Magna porcine valve is present in the aortic                 position. Procedure Date: 05/2010.  Sonographer:    Meagan Baucom RDCS, FE, PE Referring Phys: [place]  1. Left ventricular ejection fraction, by estimation, is 60 to 65%. The left ventricle has normal function. The left ventricle has no regional wall motion abnormalities. There is moderate left ventricular hypertrophy. Left ventricular diastolic parameters are indeterminate.  2. Right ventricular systolic function is normal. The right ventricular size is normal.  3. Left atrial size was moderately dilated.  4. Right atrial size was moderately dilated.  5. The mitral valve has been repaired/replaced. Mild to moderate mitral valve regurgitation. No evidence of mitral stenosis. Severe mitral annular calcification.  6. The aortic valve has been repaired/replaced. Aortic valve regurgitation is not visualized. No aortic stenosis is present. There is a 23 mm Magna porcine valve present in the aortic position. Procedure Date: 05/2010. Echo findings are consistent with normal structure and function of the aortic valve prosthesis. Aortic valve  area, by VTI measures 1.25 cm. Aortic valve mean gradient measures 16.0 mmHg. Aortic valve Vmax measures 2.51 m/s.  7. The inferior vena cava is normal in size with greater than 50% respiratory variability, suggesting right atrial pressure of 3 mmHg. Comparison(s): No significant change from prior study. Prior images reviewed side by side. Conclusion(s)/Recommendation(s): No evidence of valvular vegetations on this transthoracic echocardiogram. Consider a transesophageal echocardiogram to exclude infective endocarditis if clinically indicated. FINDINGS  Left Ventricle: Left ventricular ejection fraction, by estimation, is 60 to 65%. The left ventricle has normal function. The left ventricle has no regional wall  motion abnormalities. The left ventricular internal cavity size was normal in size. There is  moderate left ventricular hypertrophy. Left ventricular diastolic parameters are indeterminate. Right Ventricle: The right ventricular size is normal. No increase in right ventricular wall thickness. Right ventricular systolic function is normal. Left Atrium: Left atrial size was moderately dilated. Right Atrium: Right atrial size was moderately dilated. Pericardium: There is no evidence of pericardial effusion. Mitral Valve: The mitral valve has been repaired/replaced. There is mild thickening of the mitral valve leaflet(s). Severe mitral annular calcification. Mild to moderate mitral valve regurgitation. No evidence of mitral valve stenosis. MV peak gradient, 7.2 mmHg. The mean mitral valve gradient is 3.0 mmHg. Tricuspid Valve: The tricuspid valve is normal in structure. Tricuspid valve regurgitation is mild . No evidence of tricuspid stenosis. Aortic Valve: The aortic valve has been repaired/replaced. Aortic valve regurgitation is not visualized. No aortic stenosis is present. Aortic valve mean gradient measures 16.0 mmHg. Aortic valve peak gradient measures 25.2 mmHg. Aortic valve area, by VTI measures 1.25 cm. There is a 23 mm Magna porcine valve present in the aortic position. Procedure Date: 05/2010. Echo findings are consistent with normal structure and function of the aortic valve prosthesis. Pulmonic Valve: The pulmonic valve was normal in structure. Pulmonic valve regurgitation is not visualized. No evidence of pulmonic stenosis. Aorta: The aortic root is normal in size and structure. Venous: The inferior vena cava is normal in size with greater than 50% respiratory variability, suggesting right atrial pressure of 3 mmHg. IAS/Shunts: No atrial level shunt detected by color flow Doppler.  LEFT VENTRICLE PLAX 2D LVIDd:         4.00 cm   Diastology LVIDs:         2.90 cm   LV e' medial:    7.62 cm/s LV PW:          1.40 cm   LV E/e' medial:  13.9 LV IVS:        1.60 cm   LV e' lateral:   9.68 cm/s LVOT diam:     2.20 cm   LV E/e' lateral: 11.0 LV SV:         63 LV SV Index:   33 LVOT Area:     3.80 cm  RIGHT VENTRICLE RV S prime:     8.70 cm/s TAPSE (M-mode): 1.6 cm LEFT ATRIUM              Index        RIGHT ATRIUM           Index LA diam:        4.10 cm  2.18 cm/m   RA Area:     26.50 cm LA Vol (A2C):   119.0 ml 63.27 ml/m  RA Volume:   89.40 ml  47.53 ml/m LA Vol (A4C):   92.3 ml  49.07 ml/m LA Biplane Vol:  105.0 ml 55.83 ml/m  AORTIC VALVE AV Area (Vmax):    1.33 cm AV Area (Vmean):   1.20 cm AV Area (VTI):     1.25 cm AV Vmax:           251.00 cm/s AV Vmean:          188.000 cm/s AV VTI:            0.503 m AV Peak Grad:      25.2 mmHg AV Mean Grad:      16.0 mmHg LVOT Vmax:         87.50 cm/s LVOT Vmean:        59.300 cm/s LVOT VTI:          0.165 m LVOT/AV VTI ratio: 0.33  AORTA Ao Root diam: 3.60 cm Ao Asc diam:  3.20 cm MITRAL VALVE                TRICUSPID VALVE MV Area (PHT): 4.06 cm     TR Peak grad:   38.4 mmHg MV Area VTI:   2.43 cm     TR Vmax:        310.00 cm/s MV Peak grad:  7.2 mmHg MV Mean grad:  3.0 mmHg     SHUNTS MV Vmax:       1.34 m/s     Systemic VTI:  0.16 m MV Vmean:      79.6 cm/s    Systemic Diam: 2.20 cm MV Decel Time: 187 msec MV E velocity: 106.00 cm/s Candee Furbish MD Electronically signed by Candee Furbish MD Signature Date/Time: 09/04/2022/11:53:44 AM    Final    CT MAXILLOFACIAL W CONTRAST  Result Date: 09/03/2022 CLINICAL DATA:  Concern for sublingual/submandibular abscess EXAM: CT MAXILLOFACIAL WITH CONTRAST TECHNIQUE: Multidetector CT imaging of the maxillofacial structures was performed with intravenous contrast. Multiplanar CT image reconstructions were also generated. RADIATION DOSE REDUCTION: This exam was performed according to the departmental dose-optimization program which includes automated exposure control, adjustment of the mA and/or kV according to patient size and/or  use of iterative reconstruction technique. CONTRAST:  18m OMNIPAQUE IOHEXOL 300 MG/ML  SOLN COMPARISON:  None Available. FINDINGS: Evaluation is somewhat limited by beam hardening artifact from extensive dental hardware. Osseous: No fracture or mandibular dislocation. No destructive process. Evaluation for periapical lucencies is limited by aforementioned beam hardening artifact. There is extension of the root of the left canine dental implant through the anterior aspect of the maxilla (series 8, image 58 and series 4, image 43), of unknown clinical significance. Orbits: Negative. No traumatic or inflammatory finding. Status post bilateral lens replacements. Sinuses: Near complete opacification of the bilateral maxillary sinuses, with additional opacification of the right greater than left ethmoid air cells and frontal sinuses. The mastoids are well aerated. Soft tissues: Normal appearance of the parotid and submandibular glands. No evidence of soft tissue collection. Limited intracranial: No significant or unexpected finding. IMPRESSION: 1. Evaluation is somewhat limited by beam hardening artifact from extensive dental hardware. 2. Within the above limitation, extension of the root of the left canine dental implant through the anterior aspect of the maxilla, of unknown clinical significance. 3. No evidence of soft tissue collection. No evidence of sublingual or submandibular abscess 4. Near complete opacification of the bilateral maxillary sinuses, with additional opacification of the right greater than left ethmoid air cells and frontal sinuses. Correlate for sinusitis. Electronically Signed   By: AMerilyn BabaM.D.   On: 09/03/2022 19:12   MR  LUMBAR SPINE WO CONTRAST  Result Date: 09/02/2022 CLINICAL DATA:  Low back pain, bacteremic EXAM: MRI LUMBAR SPINE WITHOUT CONTRAST TECHNIQUE: Multiplanar, multisequence MR imaging of the lumbar spine was performed. No intravenous contrast was administered. COMPARISON:   08/07/2022 MRI lumbar spine, correlation is also made with 08/30/2022 CT abdomen pelvis and 10/04/2021 CT chest abdomen pelvis FINDINGS: Segmentation: 5 lumbar type vertebral bodies. Congenital ankylosis of T12 and L1. Alignment: Trace retrolisthesis of L1 on L2, L2 on L3, L3 on L4, and L5 on S1. Trace anterolisthesis of L4 on L5. Vertebrae: Redemonstrated decreased T1 signal and increased T2 signal in the majority of L1 and L2, similar to the prior exam. Increased T2 signal at the L1-L2 disc space. The endplate irregularity and erosion appears grossly unchanged compared to a prior MRI, and the 08/30/2022 CT appears unchanged compared to the 10/04/2021 CT. No acute fracture or suspicious osseous lesion. Increased T2 signal is also noted in the disc spaces at T11-T12, L2-L3, L3-L4 and L5-S1, without associated marrow abnormality. The Conus medullaris and cauda equina: Conus extends to the L1 level. The conus and cauda equina appear to closely approximate the dorsal aspect of the T12 and L1 vertebral body, and do not layer dependently, possibly adherent to theca (series 5, image 7. More inferiorly, there is clumping of the nerve roots (series 5, image 18). Both findings are concerning for arachnoiditis. No evidence of epidural collection. Paraspinal and other soft tissues: Edema at the medial aspect of the bilateral psoas muscles (series 5, image 17), most prominently at L2. No focal collection within the musculature. Increased T2 signal anterior to T12-L2, possibly inflammatory changes, without a focal T1 hypointense collection to suggest phlegmon. Disc levels: T12-L1: No significant disc bulge. No spinal canal stenosis or neural foraminal narrowing. L1-L2: Trace retrolisthesis and minimal disc bulge. No spinal canal stenosis or neural foraminal narrowing. L2-L3: Trace retrolisthesis with disc osteophyte complex. Mild facet arthropathy. Narrowing of the lateral recesses. No spinal canal stenosis or neural foraminal  narrowing. L3-L4: Trace retrolisthesis. Mild facet arthropathy. No spinal canal stenosis or neural foraminal narrowing. L4-L5: Trace anterolisthesis. Moderate facet arthropathy. Ligamentum flavum hypertrophy. Narrowing of the lateral recesses. Mild spinal canal stenosis. Mild right neural foraminal narrowing. L5-S1: Mild disc bulge. Moderate right and mild left facet arthropathy. Prior laminectomy. No spinal canal stenosis. Severe right and moderate left neural foraminal narrowing. IMPRESSION: 1. Fluid signal throughout the L1-L2 disc space, which appears increased compared to 08/07/2022, with similar T2 hyperintense signal and erosive changes in the adjacent vertebral bodies, concerning for L1-L2 discitis/osteomyelitis. No evidence of epidural collection. 2. Increased T2 signal in the disc spaces at T11-T12, L2-L3, L3-L4, and L5-S1, without associated marrow abnormality, which is favored to be degenerative in nature. Attention on follow-up 3. Edema at the medial aspect of the bilateral psoas muscles, most prominently at L2, without focal collection, likely reactive. 4. The conus and cauda equina are adherent to the thecal sac in the upper lumbar spine and appear clumped in the lower lumbar spine, concerning for arachnoiditis. These results will be called to the ordering clinician or representative by the Radiologist Assistant, and communication documented in the PACS or [employer]. Electronically Signed   By: Merilyn Baba M.D.   On: 09/02/2022 23:13   CT ABDOMEN PELVIS W CONTRAST  Result Date: 08/30/2022 CLINICAL DATA:  Metastatic disease evaluation, history of prostate cancer * Tracking Code: BO * EXAM: CT ABDOMEN AND PELVIS WITH CONTRAST TECHNIQUE: Multidetector CT imaging of the abdomen and pelvis  was performed using the standard protocol following bolus administration of intravenous contrast. RADIATION DOSE REDUCTION: This exam was performed according to the departmental dose-optimization program  which includes automated exposure control, adjustment of the mA and/or kV according to patient size and/or use of iterative reconstruction technique. CONTRAST:  196m OMNIPAQUE IOHEXOL 300 MG/ML  SOLN COMPARISON:  CT chest abdomen pelvis, 10/04/2021 FINDINGS: Lower chest: Cardiomegaly and coronary artery calcifications. Small bilateral pleural effusions. Hepatobiliary: No solid liver abnormality is seen. No gallstones, gallbladder wall thickening, or biliary dilatation. Pancreas: Unremarkable. No pancreatic ductal dilatation or surrounding inflammatory changes. Spleen: Normal in size without significant abnormality. Adrenals/Urinary Tract: Adrenal glands are unremarkable. Kidneys are normal, without renal calculi, solid lesion, or hydronephrosis. Bladder is unremarkable. Stomach/Bowel: Stomach is within normal limits. Status post right hemicolectomy and reanastomosis. No evidence of bowel wall thickening, distention, or inflammatory changes. Descending and sigmoid diverticulosis. Vascular/Lymphatic: Aortic atherosclerosis. No enlarged abdominal or pelvic lymph nodes. Reproductive: Prostatomegaly. Other: No abdominal wall hernia or abnormality. Unchanged focus of nonacute fat necrosis in the right lower quadrant (series 2, image 60). No ascites. Musculoskeletal: No acute osseous findings. Unchanged severe, sclerotic disc degenerative disease and endplate destruction of L1-L2 (series 6, image 66). IMPRESSION: 1. No evidence of lymphadenopathy or metastatic disease in the abdomen or pelvis. 2. A previously described right ureterovesicular junction soft tissue nodule is no longer seen, possibly resected. 3. Status post right hemicolectomy and reanastomosis. 4. Descending and sigmoid diverticulosis without evidence of acute diverticulitis. 5. Prostatomegaly without discretely visualized mass. 6. Cardiomegaly and coronary artery disease. 7. Small bilateral pleural effusions. Aortic Atherosclerosis (ICD10-I70.0).  Electronically Signed   By: ADelanna AhmadiM.D.   On: 08/30/2022 13:29   DG Chest 2 View  Result Date: 08/29/2022 CLINICAL DATA:  Shortness of breath EXAM: CHEST - 2 VIEW COMPARISON:  07/15/2010 chest radiograph and 10/04/2021 CT FINDINGS: Cardiomegaly and aortic valve replacement again noted. Elevation of LEFT hemidiaphragm and minimal LEFT basilar scarring/atelectasis again noted. There is no evidence of focal airspace disease, pulmonary edema, suspicious pulmonary nodule/mass, pleural effusion, or pneumothorax. No acute bony abnormalities are identified. IMPRESSION: Cardiomegaly without evidence of acute cardiopulmonary disease. Electronically Signed   By: JMargarette CanadaM.D.   On: 08/29/2022 13:34   ECHOCARDIOGRAM COMPLETE  Result Date: 08/18/2022    ECHOCARDIOGRAM REPORT   [facility] Name[facility]   [facility]abiDate of Exam: 08/18/2022 Medical Rec #:  [id number]        Height:       70.0 in Accession #:    [id number]       Weight:       170.0 lb Date of Birth:  510/12/1925        BSA:          1.948 m Patient Age:    89years          BP:           104/61 mmHg Patient Gender: M                 HR:           61 bpm. [facility]  [facility] Echo, Cardiac Doppler and Color Doppler Indications:    S/P Aortic Valve Replacement Z95.2  History:        Patient has prior history of Echocardiogram examinations, most                 recent 07/17/2014. CAD, Prior CABG, Arrythmias:Atrial  Fibrillation; Risk Factors:Dyslipidemia.                 Aortic Valve: 23 mm Magna Ease pericardial tissue valve valve is                 present in the aortic position. Procedure Date: 05/2010.  Sonographer:    Mikki Santee RDCS Referring Phys: [id number] [place]  1. S/P AVR with normal mean gradient (12 mmHg) and no AI.  2. Left ventricular ejection fraction, by estimation, is 60 to 65%. The left ventricle has normal function. The left ventricle has no regional wall motion  abnormalities. There is moderate concentric left ventricular hypertrophy. Left ventricular diastolic parameters are indeterminate.  3. Right ventricular systolic function is mildly reduced. The right ventricular size is moderately enlarged. There is mildly elevated pulmonary artery systolic pressure.  4. Left atrial size was severely dilated.  5. Right atrial size was severely dilated.  6. The mitral valve is normal in structure. Mild mitral valve regurgitation. No evidence of mitral stenosis. Moderate mitral annular calcification.  7. The aortic valve has been repaired/replaced. Aortic valve regurgitation is not visualized. No aortic stenosis is present. There is a 23 mm Magna Ease pericardial tissue valve valve present in the aortic position. Procedure Date: 05/2010.  8. The inferior vena cava is dilated in size with >50% respiratory variability, suggesting right atrial pressure of 8 mmHg. FINDINGS  Left Ventricle: Left ventricular ejection fraction, by estimation, is 60 to 65%. The left ventricle has normal function. The left ventricle has no regional wall motion abnormalities. The left ventricular internal cavity size was normal in size. There is  moderate concentric left ventricular hypertrophy. Left ventricular diastolic parameters are indeterminate. Right Ventricle: The right ventricular size is moderately enlarged. Right ventricular systolic function is mildly reduced. There is mildly elevated pulmonary artery systolic pressure. The tricuspid regurgitant velocity is 2.88 m/s, and with an assumed right atrial pressure of 8 mmHg, the estimated right ventricular systolic pressure is 73.4 mmHg. Left Atrium: Left atrial size was severely dilated. Right Atrium: Right atrial size was severely dilated. Pericardium: There is no evidence of pericardial effusion. Mitral Valve: The mitral valve is normal in structure. Moderate mitral annular calcification. Mild mitral valve regurgitation. No evidence of mitral valve  stenosis. Tricuspid Valve: The tricuspid valve is normal in structure. Tricuspid valve regurgitation is mild . No evidence of tricuspid stenosis. Aortic Valve: The aortic valve has been repaired/replaced. Aortic valve regurgitation is not visualized. No aortic stenosis is present. Aortic valve mean gradient measures 12.4 mmHg. Aortic valve peak gradient measures 21.5 mmHg. There is a 23 mm Magna Ease pericardial tissue valve valve present in the aortic position. Procedure Date: 05/2010. Pulmonic Valve: The pulmonic valve was normal in structure. Pulmonic valve regurgitation is trivial. No evidence of pulmonic stenosis. Aorta: The aortic root is normal in size and structure. Venous: The inferior vena cava is dilated in size with greater than 50% respiratory variability, suggesting right atrial pressure of 8 mmHg. IAS/Shunts: No atrial level shunt detected by color flow Doppler. Additional Comments: S/P AVR with normal mean gradient (12 mmHg) and no AI.  LEFT VENTRICLE PLAX 2D LVIDd:         3.70 cm LVIDs:         3.00 cm LV PW:         1.40 cm LV IVS:        1.70 cm  LV Volumes (MOD) LV vol d, MOD A2C:  94.0 ml LV vol d, MOD A4C: 83.9 ml LV vol s, MOD A2C: 34.4 ml LV vol s, MOD A4C: 34.1 ml LV SV MOD A2C:     59.6 ml LV SV MOD A4C:     83.9 ml LV SV MOD BP:      57.4 ml RIGHT VENTRICLE RV Basal diam:  4.50 cm RV Mid diam:    4.30 cm RV S prime:     6.23 cm/s TAPSE (M-mode): 1.0 cm LEFT ATRIUM           Index        RIGHT ATRIUM           Index LA diam:      4.70 cm 2.41 cm/m   RA Area:     27.50 cm LA Vol (A4C): 84.8 ml 43.53 ml/m  RA Volume:   95.60 ml  49.08 ml/m  AORTIC VALVE AV Vmax:           231.60 cm/s AV Vmean:          162.800 cm/s AV VTI:            0.419 m AV Peak Grad:      21.5 mmHg AV Mean Grad:      12.4 mmHg LVOT Vmax:         60.83 cm/s LVOT Vmean:        40.200 cm/s LVOT VTI:          0.115 m LVOT/AV VTI ratio: 0.27  AORTA Ao Root diam: 3.70 cm Ao Asc diam:  3.70 cm TRICUSPID VALVE TR Peak grad:    33.2 mmHg TR Vmax:        288.00 cm/s  SHUNTS Systemic VTI: 0.12 m Kirk Ruths MD Electronically signed by Kirk Ruths MD Signature Date/Time: 08/18/2022/4:51:37 PM    Final     Microbiology: No results found for this or any previous visit (from the past 240 hour(s)).   Labs: Basic Metabolic Panel: Recent Labs  Lab 09/11/22 1411  NA 133*  K 3.8  CL 96*  CO2 28  GLUCOSE 107*  BUN 12  CREATININE 0.84  CALCIUM 8.0*   Liver Function Tests: No results for input(s): "AST", "ALT", "ALKPHOS", "BILITOT", "PROT", "ALBUMIN" in the last 168 hours. No results for input(s): "LIPASE", "AMYLASE" in the last 168 hours. No results for input(s): "AMMONIA" in the last 168 hours. CBC: Recent Labs  Lab 09/11/22 0602 09/12/22 0516 09/13/22 0314 09/14/22 0421 09/15/22 0507  WBC 9.7 10.9* 14.4* 12.8* 8.5  NEUTROABS 7.1 8.2* 11.8* 10.6* 6.8  HGB 8.4* 8.8* 7.9* 7.4* 7.4*  HCT 27.3* 29.5* 25.5* 24.5* 24.7*  MCV 90.1 91.6 88.9 89.4 89.2  PLT 206 215 194 190 202   Cardiac Enzymes: No results for input(s): "CKTOTAL", "CKMB", "CKMBINDEX", "TROPONINI" in the last 168 hours. BNP: BNP (last 3 results) No results for input(s): "BNP" in the last 8760 hours.  ProBNP (last 3 results) No results for input(s): "PROBNP" in the last 8760 hours.  CBG: No results for input(s): "GLUCAP" in the last 168 hours.     Signed:  Nita Sells MD   [facility] 09/15/2022, 8:47 AM

## 2022-09-15 NOTE — Assessment & Plan Note (Signed)
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

## 2022-09-15 NOTE — NC FL2 (Signed)
[place] LEVEL OF CARE FORM     IDENTIFICATION  Patient Name: Jason [name] Birthdate: August 20, 1926 Sex: male Admission Date (Current Location): 08/29/2022  [facility] and [place] Number:  Herbalist and Address:  [facility],  [address] [place], [place]      Provider Number: [id number]  Attending Physician Name and Address:  Nita Sells, MD  Relative Name and Phone Number:   Izora Gala Parrott(spouse)[phone]    Current Level of Care: Hospital Recommended Level of Care: [place] Prior Approval Number:    Date Approved/Denied:   PASRR Number: [id number]  Discharge Plan: SNF    Current Diagnoses: Patient Active Problem List   Diagnosis Date Noted   Colonic mass 09/13/2022   Colon stricture ([place]) 09/13/2022   Malnutrition of moderate degree 09/05/2022   Streptococcus bovis infection 09/03/2022   S/P AVR 09/03/2022   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   Diskitis 09/03/2022   Vertebral osteomyelitis ([place]) 09/03/2022   Heme positive stool 08/30/2022   AVM (arteriovenous malformation) of small bowel, acquired 08/30/2022   History of iron deficiency 08/30/2022   GI bleed 08/29/2022   Hx of CABG 08/29/2022   Hypotension 08/29/2022   Sensorineural hearing loss (SNHL) of both ears 06/28/2022   Legal blindness 06/28/2022   Dysphonia 06/28/2022   Paroxysmal atrial fibrillation ([place]) 06/28/2022   OSA on CPAP 06/28/2022   History of corneal transplant 12/30/2019   Exudative age-related macular degeneration of left eye with inactive choroidal neovascularization ([place]) 12/29/2019   Retinal hemorrhage of left eye 12/29/2019   Exudative age-related macular degeneration of right eye with inactive choroidal neovascularization ([place]) 12/29/2019   Advanced nonexudative age-related macular degeneration of both eyes with subfoveal involvement 12/29/2019   Optic nerve atrophy 12/29/2019   Primary open  angle glaucoma of both eyes, severe stage 12/29/2019   Deficiency, internal organ 02/27/2017   Anemia associated with acute blood loss 11/01/2016   Lower GI bleed 10/27/2016   HOH (hard of hearing) 10/24/2016   Cecal cancer s/p robotic proximal colectomy 10/24/2016 09/25/2016   RLQ abdominal pain 08/29/2016   Abnormal abdominal CT scan 08/29/2016   Chronic anticoagulation 08/29/2016   Anemia 08/22/2016   Iron deficiency anemia due to chronic blood loss 06/29/2016   Melena    Gastric AVM    GIB (gastrointestinal bleeding) 09/14/2014   Atrial fibrillation, chronic ([place]) 07/15/2014   PVC's (premature ventricular contractions) 12/05/2012   Fatigue 12/12/2011   Heart palpitations 01/24/2011   CAD (coronary artery disease)    Aortic stenosis    Dyslipidemia    Depression    Anxiety state 11/06/2008   Aortic valve disorder 11/06/2008   CONSTIPATION 11/06/2008   IDIOPATHIC OSTEOPOROSIS 11/06/2008   OSA (obstructive sleep apnea) 11/06/2008   HEMORRHOIDS 11/05/2008   DIVERTICULOSIS, COLON 11/05/2008   Personal history of colonic polyps 11/05/2008    Orientation RESPIRATION BLADDER Height & Weight     Self, Time, Situation, Place  Normal Continent Weight: 71 kg Height:  '5\' 10"'$  (177.8 cm)  BEHAVIORAL SYMPTOMS/MOOD NEUROLOGICAL BOWEL NUTRITION STATUS      Continent Diet (regular)  AMBULATORY STATUS COMMUNICATION OF NEEDS Skin   Extensive Assist Verbally Normal                       Personal Care Assistance Level of Assistance  Bathing, Feeding, Dressing Bathing Assistance: Limited assistance Feeding assistance: Limited assistance Dressing Assistance: Limited assistance     Functional  Limitations Info  Sight, Hearing, Speech Sight Info: Impaired Hearing Info: Impaired Speech Info: Adequate    SPECIAL CARE FACTORS FREQUENCY  PT (By licensed PT), OT (By licensed OT)     PT Frequency: 5 x weekly OT Frequency: 5 x weekly            Contractures Contractures Info:  Not present    Additional Factors Info  Code Status Code Status Info: DNR Allergies Info: nka           Current Medications (09/15/2022):  This is the current hospital active medication list Current Facility-Administered Medications  Medication Dose Route Frequency Provider Last Rate Last Admin   amitriptyline (ELAVIL) tablet 25 mg  25 mg Oral QHS Gatha Mayer, MD   25 mg at 09/14/22 2057   brimonidine (ALPHAGAN) 0.2 % ophthalmic solution 1 drop  1 drop Both Eyes BID Gatha Mayer, MD   1 drop at 09/15/22 0846   cefTRIAXone (ROCEPHIN) 2 g in sodium chloride 0.9 % 100 mL IVPB  2 g Intravenous Q12H Gatha Mayer, MD 200 mL/hr at 09/14/22 2049 2 g at 09/14/22 2049   [START ON 09/19/2022] cefTRIAXone (ROCEPHIN) 2 g in sodium chloride 0.9 % 100 mL IVPB  2 g Intravenous Q24H Gatha Mayer, MD       Chlorhexidine Gluconate Cloth 2 % PADS 6 each  6 each Topical Daily Nita Sells, MD       cyanocobalamin (VITAMIN B12) tablet 1,000 mcg  1,000 mcg Oral Daily Gatha Mayer, MD   1,000 mcg at 09/15/22 0840   dorzolamide-timolol (COSOPT) 2-0.5 % ophthalmic solution 1 drop  1 drop Both Eyes BID Gatha Mayer, MD   1 drop at 09/15/22 0848   feeding supplement (ENSURE ENLIVE / ENSURE PLUS) liquid 237 mL  237 mL Oral TID BM Gatha Mayer, MD   237 mL at 09/15/22 1142   gabapentin (NEURONTIN) capsule 300 mg  300 mg Oral QID Gatha Mayer, MD   300 mg at 09/15/22 0841   guaiFENesin-dextromethorphan (ROBITUSSIN DM) 100-10 MG/5ML syrup 10 mL  10 mL Oral Q4H PRN Gatha Mayer, MD   10 mL at 09/10/22 0753   ipratropium-albuterol (DUONEB) 0.5-2.5 (3) MG/3ML nebulizer solution 3 mL  3 mL Nebulization Q6H PRN Gatha Mayer, MD       latanoprost (XALATAN) 0.005 % ophthalmic solution 1 drop  1 drop Both Eyes QHS Gatha Mayer, MD   1 drop at 09/14/22 2104   lidocaine (LIDODERM) 5 % 1 patch  1 patch Transdermal Q24H Gatha Mayer, MD   1 patch at 09/14/22 2058   metoprolol tartrate  (LOPRESSOR) injection 2.5 mg  2.5 mg Intravenous Q8H PRN Gatha Mayer, MD       Oral care mouth rinse  15 mL Mouth Rinse PRN Gatha Mayer, MD   15 mL at 08/30/22 2202   oxyCODONE (Oxy IR/ROXICODONE) immediate release tablet 2.5 mg  2.5 mg Oral Q4H PRN Nita Sells, MD   2.5 mg at 09/15/22 1141   pantoprazole (PROTONIX) EC tablet 40 mg  40 mg Oral BID AC Gatha Mayer, MD   40 mg at 09/15/22 0840   perphenazine (TRILAFON) tablet 2 mg  2 mg Oral Once per day on Mon Wed Fri Gatha Mayer, MD   2 mg at 09/13/22 2259   prednisoLONE acetate (PRED FORTE) 1 % ophthalmic suspension 1 drop  1 drop Right Eye Daily Silvano Rusk  E, MD   1 drop at 09/15/22 0847   rosuvastatin (CRESTOR) tablet 5 mg  5 mg Oral Q M,W,F Gatha Mayer, MD   5 mg at 09/15/22 0841   sucralfate (CARAFATE) 1 GM/10ML suspension 1 g  1 g Oral BID Gatha Mayer, MD   1 g at 09/15/22 0841   traMADol (ULTRAM) tablet 100 mg  100 mg Oral Q8H PRN Gatha Mayer, MD   100 mg at 09/15/22 0840     Discharge Medications: Please see discharge summary for a list of discharge medications.  Relevant Imaging Results:  Relevant Lab Results:   Additional Information ssn;[id number]  Leeroy Cha, RN

## 2022-09-15 NOTE — Progress Notes (Addendum)
RUE PICC removal: Site unremarkable. Vaseline gauze and gauze pressure dressing applied. Pt instructed to remain as flat as tolerated in bed for :30. Keep dressing dry and intact for 24 hours. Wife at bedside, voiced understanding. 1045: Bleeding noted at site. Manual pressure held for 5 additional minutes. New pressure dressing applied.

## 2022-09-15 NOTE — Assessment & Plan Note (Signed)
Hospitalized 08/29/22-09/15/22 for infectious endocarditis with strep, declined antibiotic treatment. Had TEE

## 2022-09-16 ENCOUNTER — Encounter (HOSPITAL_COMMUNITY): Payer: Self-pay | Admitting: Internal Medicine

## 2022-09-17 DIAGNOSIS — Z6822 Body mass index (BMI) 22.0-22.9, adult: Secondary | ICD-10-CM | POA: Diagnosis not present

## 2022-09-17 DIAGNOSIS — L89103 Pressure ulcer of unspecified part of back, stage 3: Secondary | ICD-10-CM | POA: Diagnosis not present

## 2022-09-17 DIAGNOSIS — D500 Iron deficiency anemia secondary to blood loss (chronic): Secondary | ICD-10-CM | POA: Diagnosis not present

## 2022-09-17 DIAGNOSIS — G4733 Obstructive sleep apnea (adult) (pediatric): Secondary | ICD-10-CM | POA: Diagnosis not present

## 2022-09-17 DIAGNOSIS — R634 Abnormal weight loss: Secondary | ICD-10-CM | POA: Diagnosis not present

## 2022-09-17 DIAGNOSIS — Q2730 Arteriovenous malformation, site unspecified: Secondary | ICD-10-CM | POA: Diagnosis not present

## 2022-09-17 DIAGNOSIS — I25810 Atherosclerosis of coronary artery bypass graft(s) without angina pectoris: Secondary | ICD-10-CM | POA: Diagnosis not present

## 2022-09-17 DIAGNOSIS — Z8551 Personal history of malignant neoplasm of bladder: Secondary | ICD-10-CM | POA: Diagnosis not present

## 2022-09-17 DIAGNOSIS — T826XXD Infection and inflammatory reaction due to cardiac valve prosthesis, subsequent encounter: Secondary | ICD-10-CM | POA: Diagnosis not present

## 2022-09-17 DIAGNOSIS — C61 Malignant neoplasm of prostate: Secondary | ICD-10-CM | POA: Diagnosis not present

## 2022-09-17 DIAGNOSIS — I480 Paroxysmal atrial fibrillation: Secondary | ICD-10-CM | POA: Diagnosis not present

## 2022-09-17 DIAGNOSIS — H3530 Unspecified macular degeneration: Secondary | ICD-10-CM | POA: Diagnosis not present

## 2022-09-17 DIAGNOSIS — H409 Unspecified glaucoma: Secondary | ICD-10-CM | POA: Diagnosis not present

## 2022-09-17 DIAGNOSIS — I38 Endocarditis, valve unspecified: Secondary | ICD-10-CM | POA: Diagnosis not present

## 2022-09-17 DIAGNOSIS — C189 Malignant neoplasm of colon, unspecified: Secondary | ICD-10-CM | POA: Diagnosis not present

## 2022-09-18 ENCOUNTER — Encounter: Payer: Self-pay | Admitting: Nurse Practitioner

## 2022-09-18 DIAGNOSIS — T826XXD Infection and inflammatory reaction due to cardiac valve prosthesis, subsequent encounter: Secondary | ICD-10-CM | POA: Diagnosis not present

## 2022-09-18 DIAGNOSIS — C189 Malignant neoplasm of colon, unspecified: Secondary | ICD-10-CM | POA: Diagnosis not present

## 2022-09-18 DIAGNOSIS — R634 Abnormal weight loss: Secondary | ICD-10-CM | POA: Diagnosis not present

## 2022-09-18 DIAGNOSIS — Q2730 Arteriovenous malformation, site unspecified: Secondary | ICD-10-CM | POA: Diagnosis not present

## 2022-09-18 DIAGNOSIS — I38 Endocarditis, valve unspecified: Secondary | ICD-10-CM | POA: Diagnosis not present

## 2022-09-18 DIAGNOSIS — D500 Iron deficiency anemia secondary to blood loss (chronic): Secondary | ICD-10-CM | POA: Diagnosis not present

## 2022-09-19 ENCOUNTER — Encounter: Payer: Self-pay | Admitting: Family Medicine

## 2022-09-19 ENCOUNTER — Non-Acute Institutional Stay (SKILLED_NURSING_FACILITY): Payer: Medicare Other | Admitting: Family Medicine

## 2022-09-19 DIAGNOSIS — Z7901 Long term (current) use of anticoagulants: Secondary | ICD-10-CM | POA: Diagnosis not present

## 2022-09-19 DIAGNOSIS — Z743 Need for continuous supervision: Secondary | ICD-10-CM | POA: Diagnosis not present

## 2022-09-19 DIAGNOSIS — M4647 Discitis, unspecified, lumbosacral region: Secondary | ICD-10-CM | POA: Diagnosis not present

## 2022-09-19 DIAGNOSIS — I359 Nonrheumatic aortic valve disorder, unspecified: Secondary | ICD-10-CM | POA: Diagnosis not present

## 2022-09-19 DIAGNOSIS — C180 Malignant neoplasm of cecum: Secondary | ICD-10-CM

## 2022-09-19 DIAGNOSIS — K31811 Angiodysplasia of stomach and duodenum with bleeding: Secondary | ICD-10-CM

## 2022-09-19 DIAGNOSIS — Q2730 Arteriovenous malformation, site unspecified: Secondary | ICD-10-CM | POA: Diagnosis not present

## 2022-09-19 DIAGNOSIS — I482 Chronic atrial fibrillation, unspecified: Secondary | ICD-10-CM | POA: Diagnosis not present

## 2022-09-19 DIAGNOSIS — I38 Endocarditis, valve unspecified: Secondary | ICD-10-CM | POA: Diagnosis not present

## 2022-09-19 DIAGNOSIS — C189 Malignant neoplasm of colon, unspecified: Secondary | ICD-10-CM | POA: Diagnosis not present

## 2022-09-19 DIAGNOSIS — D500 Iron deficiency anemia secondary to blood loss (chronic): Secondary | ICD-10-CM | POA: Diagnosis not present

## 2022-09-19 DIAGNOSIS — I4891 Unspecified atrial fibrillation: Secondary | ICD-10-CM | POA: Diagnosis not present

## 2022-09-19 DIAGNOSIS — T826XXD Infection and inflammatory reaction due to cardiac valve prosthesis, subsequent encounter: Secondary | ICD-10-CM | POA: Diagnosis not present

## 2022-09-19 DIAGNOSIS — R634 Abnormal weight loss: Secondary | ICD-10-CM | POA: Diagnosis not present

## 2022-09-19 NOTE — Progress Notes (Signed)
Provider: Su Ley  Location:  [facility] Room Number: 35-A Place of Service:  SNF (31)  PCP: [name], MD Patient Care Team: [name], MD as PCP - General (Internal Medicine) Martinique, Peter M, MD as PCP - Cardiology (Cardiology) Constance Haw, MD as PCP - Electrophysiology (Cardiology) [name], MD as Consulting Physician (General Surgery) [name], MD as Consulting Physician (Gastroenterology) Martinique, Peter M, MD as Consulting Physician (Cardiology) Rana Snare, MD (Inactive) as Consulting Physician (Urology)  Extended Emergency Contact Information Primary Emergency Contact: Parrott,Nancy Address: [address] [place] Phone: [phone] Mobile Phone: [phone] Relation: Spouse Secondary Emergency Contact: McBride Phone: [phone] Mobile Phone: [phone] Relation: Son  Code Status: DNR Goals of Care: Advanced Directive information    09/19/2022   10:11 AM  Advanced Directives  Does Patient Have a Medical Advance Directive? Yes  Type of Advance Directive Out of facility DNR (pink MOST or yellow form)  Does patient want to make changes to medical advance directive? No - Patient declined  Pre-existing out of facility DNR order (yellow form or pink MOST form) Yellow form placed in chart (order not valid for inpatient use)      Chief Complaint  Patient presents with   New Admit To SNF    HPI: Patient is a 89 y.o. male seen today for admission to [facility].  He was hospitalized from January 2 to September 15, 2022 bacteremia and infectious endocarditis with streptococcal bacteria.  Also suffered acute blood loss anemia from AVMs.  He has a history of aortic stenosis with a aortic valve replacement in the past.  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 Tums on admission in early January were increasing fatigue shortness of breath as well as low back pain, which subsequent MRI had findings concerning for osteomyelitis of the lumbar sacral spine.  TEE was performed on 115 and showed moderate grade 3 plaque on aortic valve.  Given underlying malignancies and overall poor prognosis with no surgical options it was elected to stop treatment of endocarditis. He and wife had been living in independent apartment in his home when asked.  He was admitted here at [facility] but plan is to move him back to [place] where it will be more convenient for wife to visit him.  Past Medical History:  Diagnosis Date   Abnormal PSA    Adenomatous polyp of colon 2010 and before 2006   Anemia    Anxiety    Aortic stenosis    Aortic valve prosthesis present    Atrial fibrillation (HCC)    CAD (coronary artery disease)    DDD (degenerative disc disease), lumbar    Depression    Diverticulosis    Dyslipidemia    GERD (gastroesophageal reflux disease)    Glaucoma    History of blood transfusion 09/29/2016   History of kidney stones    Hx of CABG    Hypogonadism, male    Macular degeneration    OSA (obstructive sleep apnea)    Osteoarthritis    Osteopenia    Prostate CA ([place]) prostate 2007   colon dx 2018, watching psa levels, no tx yet for prostate   Past Surgical History:  Procedure Laterality Date   AORTIC VALVE REPLACEMENT  October 2011   Magna Ease pericardial tissue valve #41m   BACK SURGERY  1978   lower   BIOPSY  08/31/2022   Procedure: BIOPSY;  Surgeon: MRush LandmarkGTelford Nab, MD;  [facility];  Service: Gastroenterology;;   BIOPSY  09/13/2022   Procedure: BIOPSY;  Surgeon: GGatha Mayer MD;  [facility];  Service: Gastroenterology;;   CATARACT EXTRACTION Bilateral    COLONOSCOPY WITH PROPOFOL N/A 09/13/2022   Procedure: COLONOSCOPY WITH PROPOFOL;  Surgeon: GGatha Mayer MD;  [facility];  Service: Gastroenterology;  Laterality: N/A;   CORNEAL TRANSPLANT Right    CORONARY ARTERY BYPASS GRAFT  October 2011   LIMA to LAD   ENTEROSCOPY N/A 08/31/2022   Procedure: ENTEROSCOPY;  Surgeon: Mansouraty, GTelford Nab, MD;  [facility];  Service: Gastroenterology;  Laterality: N/A;   ESOPHAGOGASTRODUODENOSCOPY N/A 09/16/2014   Procedure: ESOPHAGOGASTRODUODENOSCOPY (EGD);  Surgeon: JIrene Shipper MD;  [facility];  Service: Endoscopy;  Laterality: N/A;   HEMOSTASIS CLIP PLACEMENT  08/31/2022   Procedure: HEMOSTASIS CLIP PLACEMENT;  Surgeon: MIrving Copas, MD;  [facility];  Service: Gastroenterology;;   HOT HEMOSTASIS N/A 08/31/2022   Procedure: HOT HEMOSTASIS (ARGON PLASMA COAGULATION/BICAP);  Surgeon: MIrving Copas, MD;  [facility];  Service: Gastroenterology;  Laterality: N/A;   SUBMUCOSAL TATTOO INJECTION  08/31/2022   Procedure: SUBMUCOSAL TATTOO INJECTION;  Surgeon: MIrving Copas, MD;  [facility];  Service: Gastroenterology;;   SUBMUCOSAL TATTOO INJECTION  09/13/2022   Procedure: SUBMUCOSAL TATTOO INJECTION;  Surgeon: GGatha Mayer MD;  [facility];  Service: Gastroenterology;;   TEE WITHOUT CARDIOVERSION N/A 09/11/2022   Procedure: TRANSESOPHAGEAL ECHOCARDIOGRAM (TEE);  Surgeon: BJanina Mayo MD;  [facility];  Service: Cardiovascular;  Laterality: N/A;   TONSILLECTOMY  76 yrs ago   TRANSURETHRAL RESECTION OF BLADDER TUMOR Right 01/04/2022   Procedure: TRANSURETHRAL RESECTION OF BLADDER TUMOR (TURBT) WITH POST OPERATIVE INSTILLATION OF GEMCITABINE/ POSSIBLE RIGHT STENT PLACEMENT;  Surgeon: WCeasar Mons MD;  [facility];  Service: Urology;  Laterality: Right;    reports that he quit smoking about 31 years ago. His smoking use included cigarettes. He has a 40.00 pack-year smoking history. He has never used smokeless tobacco. He reports current alcohol use of  about 2.0 standard drinks of alcohol per week. He reports that he does not use drugs. Social History   Socioeconomic History   Marital status: Married    Spouse name: NIzora Gala  Number of children: 2   Years of education: Not on file   Highest education level: Not on file  Occupational History   Not on file  Tobacco Use   Smoking status: Former    Packs/day: 1.00    Years: 40.00    Total pack years: 40.00    Types: Cigarettes    Quit date: 11/16/1990    Years since quitting: 31.8   Smokeless tobacco: Never  Vaping Use   Vaping Use: Never used  Substance and Sexual Activity   Alcohol use: Yes    Alcohol/week: 2.0 standard drinks of alcohol    Types: 2 Glasses of wine per week    Comment: wine few x per week   Drug use: No   Sexual activity: Not on file  Other Topics Concern   Not on file  Social History Narrative   Right handed   Caffeine use: coffee/tea (2-3 cups coffee per day, 1 tea per day)   Lives with  wife   Social Determinants of Health   Financial Resource Strain: Not on file  Food Insecurity: No Food Insecurity (09/01/2022)   Hunger Vital Sign    Worried About Running Out of Food in the Last Year: Never true    Ran Out of Food in the Last Year: Never true  Transportation Needs: No Transportation Needs (09/01/2022)   PRAPARE - Hydrologist (Medical): No    Lack of Transportation (Non-Medical): No  Physical Activity: Not on file  Stress: Not on file  Social Connections: Not on file  Intimate Partner Violence: Not At Risk (09/01/2022)   Humiliation, Afraid, Rape, and Kick questionnaire    Fear of Current or Ex-Partner: No    Emotionally Abused: No    Physically Abused: No    Sexually Abused: No    Functional Status Survey:    Family History  Problem Relation Age of Onset   Heart attack Father    Colon cancer Neg Hx    Esophageal cancer Neg Hx    Stomach cancer Neg Hx    Rectal cancer Neg Hx     Health Maintenance  Topic  Date Due   Pneumonia Vaccine 18+ Years old (1 - PCV) Never done   DTaP/Tdap/Td (1 - Tdap) Never done   COVID-19 Vaccine (4 - 2023-24 season) 04/28/2022   Zoster Vaccines- Shingrix  Completed   HPV VACCINES  Aged Out    Allergies  Allergen Reactions   Xarelto [Rivaroxaban]     GI BLEED    Outpatient Encounter Medications as of 09/19/2022  Medication Sig   acetaminophen (TYLENOL) 500 MG tablet Take 1,000 mg by mouth in the morning and at bedtime.   amitriptyline (ELAVIL) 25 MG tablet Take 25 mg by mouth at bedtime.   dorzolamide-timolol (COSOPT) 22.3-6.8 MG/ML ophthalmic solution Place 1 drop into both eyes 2 (two) times daily.   lidocaine (LIDODERM) 5 % Place 1 patch onto the skin daily. Remove & Discard patch within 12 hours or as directed by MD   LORazepam (ATIVAN) 0.5 MG tablet Take 0.5 mg by mouth every 2 (two) hours as needed for anxiety.   morphine (ROXANOL) 20 MG/ML concentrated solution Take 0.5 mLs (10 mg total) by mouth every 3 (three) hours as needed for severe pain.   pantoprazole (PROTONIX) 40 MG tablet Take 40 mg by mouth daily.   tuberculin (TUBERSOL) 5 UNIT/0.1ML injection Inject 0.1 mLs into the skin once. for TB (Tuberculosis) Screening for 1 Administrations *MAKE SURE TO DOCUMENT IN IMMUNIZATIONS*   [DISCONTINUED] Cholecalciferol 1000 UNITS capsule Take 1,000 Units by mouth daily.    No facility-administered encounter medications on file as of 09/19/2022.    Review of Systems  Constitutional:  Positive for activity change, appetite change and fatigue.  Respiratory: Negative.    Cardiovascular:  Negative for palpitations and leg swelling.  Gastrointestinal:  Positive for abdominal pain and blood in stool.  Musculoskeletal:  Positive for back pain.  Neurological:  Positive for weakness.  All other systems reviewed and are negative.   Vitals:   09/19/22 1005  BP: 128/65  Pulse: 80  Resp: 16  Temp: 97.6 F (36.4 C)  SpO2: 97%  Weight: 146 lb 3.2 oz (66.3 kg)   Height: '5\' 10"'$  (1.778 m)   Body mass index is 20.98 kg/m. Physical Exam Vitals and nursing note reviewed.  Constitutional:      Appearance: He is ill-appearing.  HENT:     Head: Normocephalic.  Mouth/Throat:     Mouth: Mucous membranes are dry.     Pharynx: Oropharynx is clear.  Cardiovascular:     Rate and Rhythm: Normal rate. Rhythm irregular.  Pulmonary:     Effort: Pulmonary effort is normal.     Breath sounds: Normal breath sounds.  Abdominal:     Palpations: Abdomen is soft.     Tenderness: There is no guarding.  Skin:    General: Skin is dry.  Neurological:     General: No focal deficit present.     Mental Status: He is alert and oriented to person, place, and time.  Psychiatric:        Mood and Affect: Mood normal.        Behavior: Behavior normal.     Labs reviewed: Basic Metabolic Panel: Recent Labs    09/07/22 0541 09/07/22 1710 09/08/22 0456 09/11/22 1411  NA 134*  --  133* 133*  K 3.1*  --  3.7 3.8  CL 100  --  99 96*  CO2 27  --  27 28  GLUCOSE 105*  --  97 107*  BUN 11  --  8 12  CREATININE 0.74  --  0.69 0.84  CALCIUM 7.8*  --  7.8* 8.0*  MG  --  2.1  --   --    Liver Function Tests: Recent Labs    09/06/22 0536  AST 18  ALT 12  ALKPHOS 69  BILITOT 0.2*  PROT 5.9*  ALBUMIN 2.3*   No results for input(s): "LIPASE", "AMYLASE" in the last 8760 hours. No results for input(s): "AMMONIA" in the last 8760 hours. CBC: Recent Labs    09/13/22 0314 09/14/22 0421 09/15/22 0507  WBC 14.4* 12.8* 8.5  NEUTROABS 11.8* 10.6* 6.8  HGB 7.9* 7.4* 7.4*  HCT 25.5* 24.5* 24.7*  MCV 88.9 89.4 89.2  PLT 194 190 202   Cardiac Enzymes: No results for input(s): "CKTOTAL", "CKMB", "CKMBINDEX", "TROPONINI" in the last 8760 hours. BNP: Invalid input(s): "POCBNP" Lab Results  Component Value Date   HGBA1C 5.5 10/17/2016   No results found for: "TSH" Lab Results  Component Value Date   VITAMINB12 1,571 (H) 08/30/2022   Lab Results   Component Value Date   FOLATE 21.6 08/30/2022   Lab Results  Component Value Date   IRON 15 (L) 08/30/2022   TIBC 276 08/30/2022   FERRITIN 69 08/30/2022    Imaging and Procedures obtained prior to SNF admission: CT ABDOMEN PELVIS W CONTRAST  Result Date: 08/30/2022 CLINICAL DATA:  Metastatic disease evaluation, history of prostate cancer * Tracking Code: BO * EXAM: CT ABDOMEN AND PELVIS WITH CONTRAST TECHNIQUE: Multidetector CT imaging of the abdomen and pelvis was performed using the standard protocol following bolus administration of intravenous contrast. RADIATION DOSE REDUCTION: This exam was performed according to the departmental dose-optimization program which includes automated exposure control, adjustment of the mA and/or kV according to patient size and/or use of iterative reconstruction technique. CONTRAST:  120m OMNIPAQUE IOHEXOL 300 MG/ML  SOLN COMPARISON:  CT chest abdomen pelvis, 10/04/2021 FINDINGS: Lower chest: Cardiomegaly and coronary artery calcifications. Small bilateral pleural effusions. Hepatobiliary: No solid liver abnormality is seen. No gallstones, gallbladder wall thickening, or biliary dilatation. Pancreas: Unremarkable. No pancreatic ductal dilatation or surrounding inflammatory changes. Spleen: Normal in size without significant abnormality. Adrenals/Urinary Tract: Adrenal glands are unremarkable. Kidneys are normal, without renal calculi, solid lesion, or hydronephrosis. Bladder is unremarkable. Stomach/Bowel: Stomach is within normal limits. Status post right hemicolectomy and reanastomosis.  No evidence of bowel wall thickening, distention, or inflammatory changes. Descending and sigmoid diverticulosis. Vascular/Lymphatic: Aortic atherosclerosis. No enlarged abdominal or pelvic lymph nodes. Reproductive: Prostatomegaly. Other: No abdominal wall hernia or abnormality. Unchanged focus of nonacute fat necrosis in the right lower quadrant (series 2, image 60). No ascites.  Musculoskeletal: No acute osseous findings. Unchanged severe, sclerotic disc degenerative disease and endplate destruction of L1-L2 (series 6, image 66). IMPRESSION: 1. No evidence of lymphadenopathy or metastatic disease in the abdomen or pelvis. 2. A previously described right ureterovesicular junction soft tissue nodule is no longer seen, possibly resected. 3. Status post right hemicolectomy and reanastomosis. 4. Descending and sigmoid diverticulosis without evidence of acute diverticulitis. 5. Prostatomegaly without discretely visualized mass. 6. Cardiomegaly and coronary artery disease. 7. Small bilateral pleural effusions. Aortic Atherosclerosis (ICD10-I70.0). Electronically Signed   By: Delanna Ahmadi M.D.   On: 08/30/2022 13:29   DG Chest 2 View  Result Date: 08/29/2022 CLINICAL DATA:  Shortness of breath EXAM: CHEST - 2 VIEW COMPARISON:  07/15/2010 chest radiograph and 10/04/2021 CT FINDINGS: Cardiomegaly and aortic valve replacement again noted. Elevation of LEFT hemidiaphragm and minimal LEFT basilar scarring/atelectasis again noted. There is no evidence of focal airspace disease, pulmonary edema, suspicious pulmonary nodule/mass, pleural effusion, or pneumothorax. No acute bony abnormalities are identified. IMPRESSION: Cardiomegaly without evidence of acute cardiopulmonary disease. Electronically Signed   By: Margarette Canada M.D.   On: 08/29/2022 13:34    Assessment/Plan 1. Iron deficiency anemia due to chronic blood loss Chronic blood loss due to AVMs.  Had formally been anticoagulated due to his atrial fibs but this was discontinued with the GI bleeding  2. Aortic valve disorder Apparently has endocarditis this was elected to not treat based on other underlying malignancies and chronic disease  3. Atrial fibrillation, chronic (HCC) Latest control but there are irregularities  4. 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 Chronic anticoagulation Coagulation stopped due to GI bleeding  6. Discitis of lumbosacral region Apparently some osteomyelitis.  This seems to be source of his pain  7. Gastrointestinal hemorrhage associated with angiodysplasia of stomach and duodenum No specific treatment and no surgery indicated but anticoagulant DC    Family/ staff Communication: Spoke with wife.  She understands patient's clinical situation and condition.  Questions answered  Labs/tests ordered: None  Lillette Boxer. Sabra Heck, [place] [address] [place], [place] Office [phone]

## 2022-09-20 ENCOUNTER — Encounter: Payer: Self-pay | Admitting: Hematology

## 2022-09-20 DIAGNOSIS — Q2730 Arteriovenous malformation, site unspecified: Secondary | ICD-10-CM | POA: Diagnosis not present

## 2022-09-20 DIAGNOSIS — C189 Malignant neoplasm of colon, unspecified: Secondary | ICD-10-CM | POA: Diagnosis not present

## 2022-09-20 DIAGNOSIS — R634 Abnormal weight loss: Secondary | ICD-10-CM | POA: Diagnosis not present

## 2022-09-20 DIAGNOSIS — D500 Iron deficiency anemia secondary to blood loss (chronic): Secondary | ICD-10-CM | POA: Diagnosis not present

## 2022-09-20 DIAGNOSIS — I38 Endocarditis, valve unspecified: Secondary | ICD-10-CM | POA: Diagnosis not present

## 2022-09-20 DIAGNOSIS — T826XXD Infection and inflammatory reaction due to cardiac valve prosthesis, subsequent encounter: Secondary | ICD-10-CM | POA: Diagnosis not present

## 2022-09-21 ENCOUNTER — Encounter (INDEPENDENT_AMBULATORY_CARE_PROVIDER_SITE_OTHER): Payer: Medicare Other | Admitting: Ophthalmology

## 2022-09-21 ENCOUNTER — Non-Acute Institutional Stay (SKILLED_NURSING_FACILITY): Payer: Medicare Other | Admitting: Internal Medicine

## 2022-09-21 ENCOUNTER — Encounter: Payer: Self-pay | Admitting: Internal Medicine

## 2022-09-21 DIAGNOSIS — D500 Iron deficiency anemia secondary to blood loss (chronic): Secondary | ICD-10-CM | POA: Diagnosis not present

## 2022-09-21 DIAGNOSIS — T826XXS Infection and inflammatory reaction due to cardiac valve prosthesis, sequela: Secondary | ICD-10-CM | POA: Diagnosis not present

## 2022-09-21 DIAGNOSIS — K56699 Other intestinal obstruction unspecified as to partial versus complete obstruction: Secondary | ICD-10-CM | POA: Diagnosis not present

## 2022-09-21 DIAGNOSIS — C189 Malignant neoplasm of colon, unspecified: Secondary | ICD-10-CM | POA: Diagnosis not present

## 2022-09-21 DIAGNOSIS — I482 Chronic atrial fibrillation, unspecified: Secondary | ICD-10-CM | POA: Diagnosis not present

## 2022-09-21 DIAGNOSIS — I38 Endocarditis, valve unspecified: Secondary | ICD-10-CM

## 2022-09-21 DIAGNOSIS — Q2730 Arteriovenous malformation, site unspecified: Secondary | ICD-10-CM | POA: Diagnosis not present

## 2022-09-21 DIAGNOSIS — T826XXD Infection and inflammatory reaction due to cardiac valve prosthesis, subsequent encounter: Secondary | ICD-10-CM | POA: Diagnosis not present

## 2022-09-21 DIAGNOSIS — R634 Abnormal weight loss: Secondary | ICD-10-CM | POA: Diagnosis not present

## 2022-09-21 NOTE — Progress Notes (Signed)
Provider:   Location:  [place] Room Number: 03A Place of Service:  SNF [phone]) Provider: Veleta Miners, MD  PCP: [name], MD Patient Care Team: [name], MD as PCP - General (Internal Medicine) Martinique, Peter M, MD as PCP - Cardiology (Cardiology) Constance Haw, MD as PCP - Electrophysiology (Cardiology) [name], MD as Consulting Physician (General Surgery) [name], MD as Consulting Physician (Gastroenterology) Martinique, Peter M, MD as Consulting Physician (Cardiology) Rana Snare, MD (Inactive) as Consulting Physician (Urology)  Extended Emergency Contact Information Primary Emergency Contact: Parrott,Nancy Address: [address] [place] Phone: [phone] Mobile Phone: [phone] Relation: Spouse Secondary Emergency Contact: [place] Phone: [phone] Mobile Phone: [phone] Relation: Son  Code Status: DNR/Hospice Goals of Care: Advanced Directive information    09/21/2022   10:07 AM  Advanced Directives  Does Patient Have a Medical Advance Directive? Yes  Type of Advance Directive Out of facility DNR (pink MOST or yellow form)  Does patient want to make changes to medical advance directive? No - Patient declined  Pre-existing out of facility DNR order (yellow form or pink MOST form) Yellow form placed in chart (order not valid for inpatient use)      Chief Complaint  Patient presents with   Quality Metric Gaps    Discussed the need for Pneumonia , tdap,and covid vaccine   Readmit To SNF    HPI: Patient is a 89 y.o. male seen today for Readmission to SNF for [place] Transfer from [place] to [place] to be close to his wife  Patient was admitted in the hospital from  01/2 to 01/19 for bacteremia with infectious endocarditis with Streptococcus and acute blood loss anemia  Patient is legally blind and very hard of hearing.   He  has a history of aortic stenosis with AVR, CAD with CABG, A-fib on Eliquis, OSA on CPAP, cecal cancer s/p colectomy in 2018,  history of bladder cancer in May 23 and history of prostate cancer  Patient went to the hospital for fatigue, shortness of breath and low back pain His blood culture came positive for Streptococcus.  His MRI showed L1/S2 osteomyelitis TEE showed plaque on aortic valve.  Colonoscopy showed stricture at the splenic flexure concerning for malignancy With all these new diagnosis family decided to make patient comfort care and hospice.  Patient did not have any acute complaints.  His pain seems to be controlled if he does not move.  The therapy was not able to work with him.  Now he is mostly bed bound .  Does have some shortness of breath and weakness .  Also complaining of some constipation Patient has 2 pressure wounds in his back Very Poor Appetite Past Medical History:  Diagnosis Date   Abnormal PSA    Adenomatous polyp of colon 2010 and before 2006   Anemia    Anxiety    Aortic stenosis    Aortic valve prosthesis present    Atrial fibrillation (HCC)    CAD (coronary artery disease)    DDD (degenerative disc disease), lumbar    Depression    Diverticulosis    Dyslipidemia    GERD (gastroesophageal reflux disease)    Glaucoma    History of blood transfusion 09/29/2016   History of kidney stones    Hx of CABG    Hypogonadism, male  Macular degeneration    OSA (obstructive sleep apnea)    Osteoarthritis    Osteopenia    Prostate CA ([place]) prostate 2007   colon dx 2018, watching psa levels, no tx yet for prostate   Past Surgical History:  Procedure Laterality Date   AORTIC VALVE REPLACEMENT  October 2011   Magna Ease pericardial tissue valve #82m   BACK SURGERY  1978   lower   BIOPSY  08/31/2022   Procedure: BIOPSY;  Surgeon: MIrving Copas, MD;  [facility];  Service: Gastroenterology;;   BIOPSY  09/13/2022   Procedure: BIOPSY;   Surgeon: GGatha Mayer MD;  [facility];  Service: Gastroenterology;;   CATARACT EXTRACTION Bilateral    COLONOSCOPY WITH PROPOFOL N/A 09/13/2022   Procedure: COLONOSCOPY WITH PROPOFOL;  Surgeon: GGatha Mayer MD;  [facility];  Service: Gastroenterology;  Laterality: N/A;   CORNEAL TRANSPLANT Right    CORONARY ARTERY BYPASS GRAFT  October 2011   LIMA to LAD   ENTEROSCOPY N/A 08/31/2022   Procedure: ENTEROSCOPY;  Surgeon: Mansouraty, GTelford Nab, MD;  [facility];  Service: Gastroenterology;  Laterality: N/A;   ESOPHAGOGASTRODUODENOSCOPY N/A 09/16/2014   Procedure: ESOPHAGOGASTRODUODENOSCOPY (EGD);  Surgeon: JIrene Shipper MD;  [facility];  Service: Endoscopy;  Laterality: N/A;   HEMOSTASIS CLIP PLACEMENT  08/31/2022   Procedure: HEMOSTASIS CLIP PLACEMENT;  Surgeon: MIrving Copas, MD;  [facility];  Service: Gastroenterology;;   HOT HEMOSTASIS N/A 08/31/2022   Procedure: HOT HEMOSTASIS (ARGON PLASMA COAGULATION/BICAP);  Surgeon: MIrving Copas, MD;  [facility];  Service: Gastroenterology;  Laterality: N/A;   SUBMUCOSAL TATTOO INJECTION  08/31/2022   Procedure: SUBMUCOSAL TATTOO INJECTION;  Surgeon: MIrving Copas, MD;  [facility];  Service: Gastroenterology;;   SUBMUCOSAL TATTOO INJECTION  09/13/2022   Procedure: SUBMUCOSAL TATTOO INJECTION;  Surgeon: GGatha Mayer MD;  [facility];  Service: Gastroenterology;;   TEE WITHOUT CARDIOVERSION N/A 09/11/2022   Procedure: TRANSESOPHAGEAL ECHOCARDIOGRAM (TEE);  Surgeon: BJanina Mayo MD;  [facility];  Service: Cardiovascular;  Laterality: N/A;   TONSILLECTOMY  76 yrs ago   TRANSURETHRAL RESECTION OF BLADDER TUMOR Right 01/04/2022   Procedure: TRANSURETHRAL RESECTION OF BLADDER TUMOR (TURBT) WITH POST OPERATIVE INSTILLATION OF GEMCITABINE/ POSSIBLE RIGHT STENT PLACEMENT;  Surgeon: WCeasar Mons MD;  [facility]: [facility];  Service: Urology;  Laterality: Right;    reports that he quit smoking about 31 years ago. His smoking use included cigarettes. He has a 40.00 pack-year smoking history. He has never used smokeless tobacco. He reports current alcohol use of about 2.0 standard drinks of alcohol per week. He reports that he does not use drugs. Social History   Socioeconomic History   Marital status: Married    Spouse name: NIzora Gala  Number of children: 2   Years of education: Not on file   Highest education level: Not on file  Occupational History   Not on file  Tobacco Use   Smoking status: Former    Packs/day: 1.00    Years: 40.00    Total pack years: 40.00    Types: Cigarettes    Quit date: 11/16/1990    Years since quitting: 31.8   Smokeless tobacco: Never  Vaping Use   Vaping Use: Never used  Substance and Sexual Activity   Alcohol use: Yes    Alcohol/week: 2.0 standard drinks of alcohol    Types: 2 Glasses of wine per week  Comment: wine few x per week   Drug use: No   Sexual activity: Not on file  Other Topics Concern   Not on file  Social History Narrative   Right handed   Caffeine use: coffee/tea (2-3 cups coffee per day, 1 tea per day)   Lives with wife   Social Determinants of Health   Financial Resource Strain: Not on file  Food Insecurity: No Food Insecurity (09/01/2022)   Hunger Vital Sign    Worried About Running Out of Food in the Last Year: Never true    Ran Out of Food in the Last Year: Never true  Transportation Needs: No Transportation Needs (09/01/2022)   PRAPARE - Hydrologist (Medical): No    Lack of Transportation (Non-Medical): No  Physical Activity: Not on file  Stress: Not on file  Social Connections: Not on file  Intimate Partner Violence: Not At Risk (09/01/2022)   Humiliation, Afraid, Rape, and Kick questionnaire    Fear of Current or Ex-Partner: No    Emotionally Abused: No    Physically Abused: No    Sexually Abused: No     Functional Status Survey:    Family History  Problem Relation Age of Onset   Heart attack Father    Colon cancer Neg Hx    Esophageal cancer Neg Hx    Stomach cancer Neg Hx    Rectal cancer Neg Hx     Health Maintenance  Topic Date Due   Pneumonia Vaccine 74+ Years old (2 - PCV) 01/06/2011   COVID-19 Vaccine (4 - 2023-24 season) 04/28/2022   DTaP/Tdap/Td (2 - Td or Tdap) 11/28/2022   Zoster Vaccines- Shingrix  Completed   HPV VACCINES  Aged Out    Allergies  Allergen Reactions   Xarelto [Rivaroxaban]     GI BLEED    Outpatient Encounter Medications as of 09/21/2022  Medication Sig   acetaminophen (TYLENOL) 500 MG tablet Take 1,000 mg by mouth in the morning and at bedtime.   amitriptyline (ELAVIL) 25 MG tablet Take 25 mg by mouth at bedtime.   dorzolamide-timolol (COSOPT) 22.3-6.8 MG/ML ophthalmic solution Place 1 drop into both eyes 2 (two) times daily.   lidocaine (LIDODERM) 5 % Place 1 patch onto the skin daily. Remove & Discard patch within 12 hours or as directed by MD   LORazepam (ATIVAN) 0.5 MG tablet Take 0.5 mg by mouth every 2 (two) hours as needed for anxiety.   morphine (ROXANOL) 20 MG/ML concentrated solution Take 0.5 mLs (10 mg total) by mouth every 3 (three) hours as needed for severe pain.   pantoprazole (PROTONIX) 40 MG tablet Take 40 mg by mouth daily.   senna-docusate (SENOKOT-S) 8.6-50 MG tablet Take 1 tablet by mouth daily.   tuberculin (TUBERSOL) 5 UNIT/0.1ML injection Inject 0.1 mLs into the skin once. for TB (Tuberculosis) Screening for 1 Administrations *MAKE SURE TO DOCUMENT IN IMMUNIZATIONS*   Zinc Oxide 10 % OINT Apply 1 Application topically as needed.   No facility-administered encounter medications on file as of 09/21/2022.    Review of Systems  Constitutional:  Positive for activity change and appetite change. Negative for unexpected weight change.  HENT: Negative.    Respiratory:  Positive for shortness of breath. Negative for cough.    Cardiovascular:  Negative for leg swelling.  Gastrointestinal:  Positive for constipation.  Genitourinary:  Negative for frequency.  Musculoskeletal:  Positive for arthralgias, back pain, gait problem and myalgias.  Skin: Negative.  Negative for rash.  Neurological:  Positive for weakness. Negative for dizziness.  Psychiatric/Behavioral:  Negative for confusion and sleep disturbance.   All other systems reviewed and are negative.   Vitals:   09/21/22 0958  BP: (!) 155/88  Pulse: 66  Resp: 18  Temp: 97.8 F (36.6 C)  TempSrc: Temporal  SpO2: 97%  Weight: 146 lb 4.8 oz (66.4 kg)  Height: '5\' 10"'$  (1.778 m)   Body mass index is 20.99 kg/m. Physical Exam Vitals reviewed.  Constitutional:      Appearance: Normal appearance.  HENT:     Head: Normocephalic.     Nose: Nose normal.     Mouth/Throat:     Mouth: Mucous membranes are moist.     Pharynx: Oropharynx is clear.  Eyes:     Pupils: Pupils are equal, round, and reactive to light.  Cardiovascular:     Rate and Rhythm: Normal rate. Rhythm irregular.     Pulses: Normal pulses.     Heart sounds: Murmur heard.  Pulmonary:     Effort: Pulmonary effort is normal. No respiratory distress.     Breath sounds: Normal breath sounds. No rales.  Abdominal:     General: Abdomen is flat. Bowel sounds are normal.     Palpations: Abdomen is soft.  Musculoskeletal:        General: No swelling.     Cervical back: Neck supple.  Skin:    General: Skin is warm.     Comments: Has 2 Pressure wounds One in Sacral area Stage 2  One in the Mid Back which is Stage 2 with some discharge and [place]  Neurological:     General: No focal deficit present.     Mental Status: He is alert and oriented to person, place, and time.  Psychiatric:        Mood and Affect: Mood normal.        Thought Content: Thought content normal.    Labs reviewed: Basic Metabolic Panel: Recent Labs    09/07/22 0541 09/07/22 1710 09/08/22 0456 09/11/22 1411   NA 134*  --  133* 133*  K 3.1*  --  3.7 3.8  CL 100  --  99 96*  CO2 27  --  27 28  GLUCOSE 105*  --  97 107*  BUN 11  --  8 12  CREATININE 0.74  --  0.69 0.84  CALCIUM 7.8*  --  7.8* 8.0*  MG  --  2.1  --   --    Liver Function Tests: Recent Labs    09/06/22 0536  AST 18  ALT 12  ALKPHOS 69  BILITOT 0.2*  PROT 5.9*  ALBUMIN 2.3*   No results for input(s): "LIPASE", "AMYLASE" in the last 8760 hours. No results for input(s): "AMMONIA" in the last 8760 hours. CBC: Recent Labs    09/13/22 0314 09/14/22 0421 09/15/22 0507  WBC 14.4* 12.8* 8.5  NEUTROABS 11.8* 10.6* 6.8  HGB 7.9* 7.4* 7.4*  HCT 25.5* 24.5* 24.7*  MCV 88.9 89.4 89.2  PLT 194 190 202   Cardiac Enzymes: No results for input(s): "CKTOTAL", "CKMB", "CKMBINDEX", "TROPONINI" in the last 8760 hours. BNP: Invalid input(s): "POCBNP" Lab Results  Component Value Date   HGBA1C 5.5 10/17/2016   No results found for: "TSH" Lab Results  Component Value Date   VITAMINB12 1,571 (H) 08/30/2022   Lab Results  Component Value Date   FOLATE 21.6 08/30/2022   Lab Results  Component Value Date  IRON 15 (L) 08/30/2022   TIBC 276 08/30/2022   FERRITIN 69 08/30/2022    Imaging and Procedures obtained prior to SNF admission: CT ABDOMEN PELVIS W CONTRAST  Result Date: 08/30/2022 CLINICAL DATA:  Metastatic disease evaluation, history of prostate cancer * Tracking Code: BO * EXAM: CT ABDOMEN AND PELVIS WITH CONTRAST TECHNIQUE: Multidetector CT imaging of the abdomen and pelvis was performed using the standard protocol following bolus administration of intravenous contrast. RADIATION DOSE REDUCTION: This exam was performed according to the departmental dose-optimization program which includes automated exposure control, adjustment of the mA and/or kV according to patient size and/or use of iterative reconstruction technique. CONTRAST:  169m OMNIPAQUE IOHEXOL 300 MG/ML  SOLN COMPARISON:  CT chest abdomen pelvis,  10/04/2021 FINDINGS: Lower chest: Cardiomegaly and coronary artery calcifications. Small bilateral pleural effusions. Hepatobiliary: No solid liver abnormality is seen. No gallstones, gallbladder wall thickening, or biliary dilatation. Pancreas: Unremarkable. No pancreatic ductal dilatation or surrounding inflammatory changes. Spleen: Normal in size without significant abnormality. Adrenals/Urinary Tract: Adrenal glands are unremarkable. Kidneys are normal, without renal calculi, solid lesion, or hydronephrosis. Bladder is unremarkable. Stomach/Bowel: Stomach is within normal limits. Status post right hemicolectomy and reanastomosis. No evidence of bowel wall thickening, distention, or inflammatory changes. Descending and sigmoid diverticulosis. Vascular/Lymphatic: Aortic atherosclerosis. No enlarged abdominal or pelvic lymph nodes. Reproductive: Prostatomegaly. Other: No abdominal wall hernia or abnormality. Unchanged focus of nonacute fat necrosis in the right lower quadrant (series 2, image 60). No ascites. Musculoskeletal: No acute osseous findings. Unchanged severe, sclerotic disc degenerative disease and endplate destruction of L1-L2 (series 6, image 66). IMPRESSION: 1. No evidence of lymphadenopathy or metastatic disease in the abdomen or pelvis. 2. A previously described right ureterovesicular junction soft tissue nodule is no longer seen, possibly resected. 3. Status post right hemicolectomy and reanastomosis. 4. Descending and sigmoid diverticulosis without evidence of acute diverticulitis. 5. Prostatomegaly without discretely visualized mass. 6. Cardiomegaly and coronary artery disease. 7. Small bilateral pleural effusions. Aortic Atherosclerosis (ICD10-I70.0). Electronically Signed   By: ADelanna AhmadiM.D.   On: 08/30/2022 13:29   DG Chest 2 View  Result Date: 08/29/2022 CLINICAL DATA:  Shortness of breath EXAM: CHEST - 2 VIEW COMPARISON:  07/15/2010 chest radiograph and 10/04/2021 CT FINDINGS:  Cardiomegaly and aortic valve replacement again noted. Elevation of LEFT hemidiaphragm and minimal LEFT basilar scarring/atelectasis again noted. There is no evidence of focal airspace disease, pulmonary edema, suspicious pulmonary nodule/mass, pleural effusion, or pneumothorax. No acute bony abnormalities are identified. IMPRESSION: Cardiomegaly without evidence of acute cardiopulmonary disease. Electronically Signed   By: JMargarette CanadaM.D.   On: 08/29/2022 13:34    Assessment/Plan 1. Prosthetic valve endocarditis, sequela Off Antibiotics  2. [place] strict[place] Due to Adenocarcinoma Enrolled iN hospice  3. Iron deficiency anemia due to chronic blood loss Due to Cancer Enrolled in hospice On Protonix 4. Atrial fibrillation, chronic (HCC) Off anticoagulation 5 Constipation Add Miralax 6 Continue Pain control with Roxanol and Ativan 7 Pressure wounds Use Xerofoam and Foam dressing QOD   Family/ staff Communication: Wife in the room  Labs/tests ordered:

## 2022-09-26 DIAGNOSIS — C189 Malignant neoplasm of colon, unspecified: Secondary | ICD-10-CM | POA: Diagnosis not present

## 2022-09-26 DIAGNOSIS — Q2730 Arteriovenous malformation, site unspecified: Secondary | ICD-10-CM | POA: Diagnosis not present

## 2022-09-26 DIAGNOSIS — I38 Endocarditis, valve unspecified: Secondary | ICD-10-CM | POA: Diagnosis not present

## 2022-09-26 DIAGNOSIS — T826XXD Infection and inflammatory reaction due to cardiac valve prosthesis, subsequent encounter: Secondary | ICD-10-CM | POA: Diagnosis not present

## 2022-09-26 DIAGNOSIS — R634 Abnormal weight loss: Secondary | ICD-10-CM | POA: Diagnosis not present

## 2022-09-26 DIAGNOSIS — D500 Iron deficiency anemia secondary to blood loss (chronic): Secondary | ICD-10-CM | POA: Diagnosis not present

## 2022-09-27 ENCOUNTER — Inpatient Hospital Stay: Payer: Medicare Other | Admitting: Infectious Diseases

## 2022-09-28 DIAGNOSIS — H3530 Unspecified macular degeneration: Secondary | ICD-10-CM | POA: Diagnosis not present

## 2022-09-28 DIAGNOSIS — Z6822 Body mass index (BMI) 22.0-22.9, adult: Secondary | ICD-10-CM | POA: Diagnosis not present

## 2022-09-28 DIAGNOSIS — I38 Endocarditis, valve unspecified: Secondary | ICD-10-CM | POA: Diagnosis not present

## 2022-09-28 DIAGNOSIS — C189 Malignant neoplasm of colon, unspecified: Secondary | ICD-10-CM | POA: Diagnosis not present

## 2022-09-28 DIAGNOSIS — I25810 Atherosclerosis of coronary artery bypass graft(s) without angina pectoris: Secondary | ICD-10-CM | POA: Diagnosis not present

## 2022-09-28 DIAGNOSIS — T826XXD Infection and inflammatory reaction due to cardiac valve prosthesis, subsequent encounter: Secondary | ICD-10-CM | POA: Diagnosis not present

## 2022-09-28 DIAGNOSIS — H409 Unspecified glaucoma: Secondary | ICD-10-CM | POA: Diagnosis not present

## 2022-09-28 DIAGNOSIS — G4733 Obstructive sleep apnea (adult) (pediatric): Secondary | ICD-10-CM | POA: Diagnosis not present

## 2022-09-28 DIAGNOSIS — I480 Paroxysmal atrial fibrillation: Secondary | ICD-10-CM | POA: Diagnosis not present

## 2022-09-28 DIAGNOSIS — Q2730 Arteriovenous malformation, site unspecified: Secondary | ICD-10-CM | POA: Diagnosis not present

## 2022-09-28 DIAGNOSIS — L89103 Pressure ulcer of unspecified part of back, stage 3: Secondary | ICD-10-CM | POA: Diagnosis not present

## 2022-09-28 DIAGNOSIS — Z8551 Personal history of malignant neoplasm of bladder: Secondary | ICD-10-CM | POA: Diagnosis not present

## 2022-09-28 DIAGNOSIS — C61 Malignant neoplasm of prostate: Secondary | ICD-10-CM | POA: Diagnosis not present

## 2022-09-28 DIAGNOSIS — R634 Abnormal weight loss: Secondary | ICD-10-CM | POA: Diagnosis not present

## 2022-09-28 DIAGNOSIS — D500 Iron deficiency anemia secondary to blood loss (chronic): Secondary | ICD-10-CM | POA: Diagnosis not present

## 2022-09-29 DIAGNOSIS — T826XXD Infection and inflammatory reaction due to cardiac valve prosthesis, subsequent encounter: Secondary | ICD-10-CM | POA: Diagnosis not present

## 2022-09-29 DIAGNOSIS — I38 Endocarditis, valve unspecified: Secondary | ICD-10-CM | POA: Diagnosis not present

## 2022-09-29 DIAGNOSIS — R634 Abnormal weight loss: Secondary | ICD-10-CM | POA: Diagnosis not present

## 2022-09-29 DIAGNOSIS — D500 Iron deficiency anemia secondary to blood loss (chronic): Secondary | ICD-10-CM | POA: Diagnosis not present

## 2022-09-29 DIAGNOSIS — Q2730 Arteriovenous malformation, site unspecified: Secondary | ICD-10-CM | POA: Diagnosis not present

## 2022-09-29 DIAGNOSIS — C189 Malignant neoplasm of colon, unspecified: Secondary | ICD-10-CM | POA: Diagnosis not present

## 2022-10-05 DIAGNOSIS — R634 Abnormal weight loss: Secondary | ICD-10-CM | POA: Diagnosis not present

## 2022-10-05 DIAGNOSIS — C189 Malignant neoplasm of colon, unspecified: Secondary | ICD-10-CM | POA: Diagnosis not present

## 2022-10-05 DIAGNOSIS — T826XXD Infection and inflammatory reaction due to cardiac valve prosthesis, subsequent encounter: Secondary | ICD-10-CM | POA: Diagnosis not present

## 2022-10-05 DIAGNOSIS — I38 Endocarditis, valve unspecified: Secondary | ICD-10-CM | POA: Diagnosis not present

## 2022-10-05 DIAGNOSIS — Q2730 Arteriovenous malformation, site unspecified: Secondary | ICD-10-CM | POA: Diagnosis not present

## 2022-10-05 DIAGNOSIS — D500 Iron deficiency anemia secondary to blood loss (chronic): Secondary | ICD-10-CM | POA: Diagnosis not present

## 2022-10-10 DIAGNOSIS — Q2730 Arteriovenous malformation, site unspecified: Secondary | ICD-10-CM | POA: Diagnosis not present

## 2022-10-10 DIAGNOSIS — T826XXD Infection and inflammatory reaction due to cardiac valve prosthesis, subsequent encounter: Secondary | ICD-10-CM | POA: Diagnosis not present

## 2022-10-10 DIAGNOSIS — R634 Abnormal weight loss: Secondary | ICD-10-CM | POA: Diagnosis not present

## 2022-10-10 DIAGNOSIS — I38 Endocarditis, valve unspecified: Secondary | ICD-10-CM | POA: Diagnosis not present

## 2022-10-10 DIAGNOSIS — C189 Malignant neoplasm of colon, unspecified: Secondary | ICD-10-CM | POA: Diagnosis not present

## 2022-10-10 DIAGNOSIS — D500 Iron deficiency anemia secondary to blood loss (chronic): Secondary | ICD-10-CM | POA: Diagnosis not present

## 2022-10-12 DIAGNOSIS — R634 Abnormal weight loss: Secondary | ICD-10-CM | POA: Diagnosis not present

## 2022-10-12 DIAGNOSIS — C189 Malignant neoplasm of colon, unspecified: Secondary | ICD-10-CM | POA: Diagnosis not present

## 2022-10-12 DIAGNOSIS — D500 Iron deficiency anemia secondary to blood loss (chronic): Secondary | ICD-10-CM | POA: Diagnosis not present

## 2022-10-12 DIAGNOSIS — Q2730 Arteriovenous malformation, site unspecified: Secondary | ICD-10-CM | POA: Diagnosis not present

## 2022-10-12 DIAGNOSIS — T826XXD Infection and inflammatory reaction due to cardiac valve prosthesis, subsequent encounter: Secondary | ICD-10-CM | POA: Diagnosis not present

## 2022-10-12 DIAGNOSIS — I38 Endocarditis, valve unspecified: Secondary | ICD-10-CM | POA: Diagnosis not present

## 2022-10-16 DIAGNOSIS — R634 Abnormal weight loss: Secondary | ICD-10-CM | POA: Diagnosis not present

## 2022-10-16 DIAGNOSIS — D500 Iron deficiency anemia secondary to blood loss (chronic): Secondary | ICD-10-CM | POA: Diagnosis not present

## 2022-10-16 DIAGNOSIS — C189 Malignant neoplasm of colon, unspecified: Secondary | ICD-10-CM | POA: Diagnosis not present

## 2022-10-16 DIAGNOSIS — T826XXD Infection and inflammatory reaction due to cardiac valve prosthesis, subsequent encounter: Secondary | ICD-10-CM | POA: Diagnosis not present

## 2022-10-16 DIAGNOSIS — Q2730 Arteriovenous malformation, site unspecified: Secondary | ICD-10-CM | POA: Diagnosis not present

## 2022-10-16 DIAGNOSIS — I38 Endocarditis, valve unspecified: Secondary | ICD-10-CM | POA: Diagnosis not present

## 2022-10-17 ENCOUNTER — Other Ambulatory Visit: Payer: Self-pay | Admitting: Adult Health

## 2022-10-17 DIAGNOSIS — T826XXD Infection and inflammatory reaction due to cardiac valve prosthesis, subsequent encounter: Secondary | ICD-10-CM | POA: Diagnosis not present

## 2022-10-17 DIAGNOSIS — R634 Abnormal weight loss: Secondary | ICD-10-CM | POA: Diagnosis not present

## 2022-10-17 DIAGNOSIS — I38 Endocarditis, valve unspecified: Secondary | ICD-10-CM | POA: Diagnosis not present

## 2022-10-17 DIAGNOSIS — D500 Iron deficiency anemia secondary to blood loss (chronic): Secondary | ICD-10-CM | POA: Diagnosis not present

## 2022-10-17 DIAGNOSIS — Q2730 Arteriovenous malformation, site unspecified: Secondary | ICD-10-CM | POA: Diagnosis not present

## 2022-10-17 DIAGNOSIS — C189 Malignant neoplasm of colon, unspecified: Secondary | ICD-10-CM | POA: Diagnosis not present

## 2022-10-17 MED ORDER — MORPHINE SULFATE (CONCENTRATE) 20 MG/ML PO SOLN: 10.0000 mg | ORAL | 0 refills | Status: AC | PRN

## 2022-10-19 DIAGNOSIS — D500 Iron deficiency anemia secondary to blood loss (chronic): Secondary | ICD-10-CM | POA: Diagnosis not present

## 2022-10-19 DIAGNOSIS — C189 Malignant neoplasm of colon, unspecified: Secondary | ICD-10-CM | POA: Diagnosis not present

## 2022-10-19 DIAGNOSIS — T826XXD Infection and inflammatory reaction due to cardiac valve prosthesis, subsequent encounter: Secondary | ICD-10-CM | POA: Diagnosis not present

## 2022-10-19 DIAGNOSIS — I38 Endocarditis, valve unspecified: Secondary | ICD-10-CM | POA: Diagnosis not present

## 2022-10-19 DIAGNOSIS — R634 Abnormal weight loss: Secondary | ICD-10-CM | POA: Diagnosis not present

## 2022-10-19 DIAGNOSIS — Q2730 Arteriovenous malformation, site unspecified: Secondary | ICD-10-CM | POA: Diagnosis not present

## 2022-10-27 DEATH — deceased

## 2022-11-08 ENCOUNTER — Ambulatory Visit: Payer: BLUE CROSS/BLUE SHIELD | Admitting: Neurology

## 2023-01-17 ENCOUNTER — Ambulatory Visit: Payer: Medicare Other | Admitting: Cardiology
